# Patient Record
Sex: Male | Born: 1935 | Race: White | Hispanic: No | Marital: Married | Smoking: Former smoker
Health system: Southern US, Community
[De-identification: ages and names within clinical notes are randomized; demographics above are authoritative.]

## PROBLEM LIST (undated history)

## (undated) DIAGNOSIS — K449 Diaphragmatic hernia without obstruction or gangrene: Secondary | ICD-10-CM

## (undated) DIAGNOSIS — Z972 Presence of dental prosthetic device (complete) (partial): Secondary | ICD-10-CM

## (undated) DIAGNOSIS — IMO0002 Reserved for concepts with insufficient information to code with codable children: Secondary | ICD-10-CM

## (undated) DIAGNOSIS — K52831 Collagenous colitis: Secondary | ICD-10-CM

## (undated) DIAGNOSIS — D649 Anemia, unspecified: Secondary | ICD-10-CM

## (undated) DIAGNOSIS — K08109 Complete loss of teeth, unspecified cause, unspecified class: Secondary | ICD-10-CM

## (undated) DIAGNOSIS — N400 Enlarged prostate without lower urinary tract symptoms: Secondary | ICD-10-CM

## (undated) DIAGNOSIS — N189 Chronic kidney disease, unspecified: Secondary | ICD-10-CM

## (undated) DIAGNOSIS — D62 Acute posthemorrhagic anemia: Secondary | ICD-10-CM

## (undated) DIAGNOSIS — K5790 Diverticulosis of intestine, part unspecified, without perforation or abscess without bleeding: Secondary | ICD-10-CM

## (undated) DIAGNOSIS — K900 Celiac disease: Secondary | ICD-10-CM

## (undated) DIAGNOSIS — K222 Esophageal obstruction: Secondary | ICD-10-CM

## (undated) DIAGNOSIS — I10 Essential (primary) hypertension: Secondary | ICD-10-CM

## (undated) DIAGNOSIS — K219 Gastro-esophageal reflux disease without esophagitis: Secondary | ICD-10-CM

## (undated) DIAGNOSIS — K644 Residual hemorrhoidal skin tags: Secondary | ICD-10-CM

## (undated) DIAGNOSIS — K648 Other hemorrhoids: Secondary | ICD-10-CM

## (undated) DIAGNOSIS — M1990 Unspecified osteoarthritis, unspecified site: Secondary | ICD-10-CM

## (undated) DIAGNOSIS — K224 Dyskinesia of esophagus: Secondary | ICD-10-CM

## (undated) DIAGNOSIS — Z9989 Dependence on other enabling machines and devices: Secondary | ICD-10-CM

## (undated) DIAGNOSIS — G4733 Obstructive sleep apnea (adult) (pediatric): Secondary | ICD-10-CM

## (undated) DIAGNOSIS — K589 Irritable bowel syndrome without diarrhea: Secondary | ICD-10-CM

## (undated) DIAGNOSIS — G4730 Sleep apnea, unspecified: Secondary | ICD-10-CM

## (undated) HISTORY — DX: Diverticulosis of intestine, part unspecified, without perforation or abscess without bleeding: K57.90

## (undated) HISTORY — PX: EYE SURGERY: SHX253

## (undated) HISTORY — DX: Anemia, unspecified: D64.9

## (undated) HISTORY — DX: Reserved for concepts with insufficient information to code with codable children: IMO0002

## (undated) HISTORY — DX: Esophageal obstruction: K22.2

## (undated) HISTORY — DX: Dependence on other enabling machines and devices: Z99.89

## (undated) HISTORY — DX: Presence of dental prosthetic device (complete) (partial): K08.109

## (undated) HISTORY — PX: HIP SURGERY: SHX245

## (undated) HISTORY — DX: Celiac disease: K90.0

## (undated) HISTORY — DX: Sleep apnea, unspecified: G47.30

## (undated) HISTORY — DX: Diaphragmatic hernia without obstruction or gangrene: K44.9

## (undated) HISTORY — DX: Presence of dental prosthetic device (complete) (partial): Z97.2

## (undated) HISTORY — DX: Obstructive sleep apnea (adult) (pediatric): G47.33

## (undated) HISTORY — DX: Dyskinesia of esophagus: K22.4

## (undated) HISTORY — DX: Other hemorrhoids: K64.8

## (undated) HISTORY — DX: Irritable bowel syndrome, unspecified: K58.9

## (undated) HISTORY — DX: Gastro-esophageal reflux disease without esophagitis: K21.9

## (undated) HISTORY — DX: Residual hemorrhoidal skin tags: K64.4

## (undated) HISTORY — DX: Collagenous colitis: K52.831

---

## 1961-10-13 HISTORY — PX: APPENDECTOMY: SHX54

## 1998-09-19 ENCOUNTER — Ambulatory Visit (HOSPITAL_BASED_OUTPATIENT_CLINIC_OR_DEPARTMENT_OTHER): Admission: RE | Admit: 1998-09-19 | Discharge: 1998-09-19 | Payer: Self-pay | Admitting: General Surgery

## 2000-10-13 HISTORY — PX: HERNIA REPAIR: SHX51

## 2002-11-02 ENCOUNTER — Ambulatory Visit (HOSPITAL_COMMUNITY): Admission: RE | Admit: 2002-11-02 | Discharge: 2002-11-02 | Payer: Self-pay | Admitting: Family Medicine

## 2002-11-02 ENCOUNTER — Encounter: Payer: Self-pay | Admitting: Family Medicine

## 2003-09-01 ENCOUNTER — Ambulatory Visit (HOSPITAL_COMMUNITY): Admission: RE | Admit: 2003-09-01 | Discharge: 2003-09-01 | Payer: Self-pay | Admitting: Neurosurgery

## 2003-09-06 ENCOUNTER — Inpatient Hospital Stay (HOSPITAL_COMMUNITY)
Admission: AD | Admit: 2003-09-06 | Discharge: 2003-09-07 | Payer: Self-pay | Admitting: Physical Medicine and Rehabilitation

## 2003-09-11 ENCOUNTER — Encounter: Admission: RE | Admit: 2003-09-11 | Discharge: 2003-09-11 | Payer: Self-pay | Admitting: Neurosurgery

## 2003-09-25 ENCOUNTER — Encounter: Admission: RE | Admit: 2003-09-25 | Discharge: 2003-09-25 | Payer: Self-pay | Admitting: Neurosurgery

## 2003-11-14 ENCOUNTER — Encounter: Admission: RE | Admit: 2003-11-14 | Discharge: 2003-11-14 | Payer: Self-pay | Admitting: Neurosurgery

## 2005-10-31 ENCOUNTER — Ambulatory Visit (HOSPITAL_COMMUNITY): Admission: RE | Admit: 2005-10-31 | Discharge: 2005-10-31 | Payer: Self-pay | Admitting: Family Medicine

## 2005-11-04 ENCOUNTER — Ambulatory Visit (HOSPITAL_COMMUNITY): Admission: RE | Admit: 2005-11-04 | Discharge: 2005-11-04 | Payer: Self-pay | Admitting: Family Medicine

## 2006-01-26 ENCOUNTER — Ambulatory Visit (HOSPITAL_COMMUNITY): Admission: RE | Admit: 2006-01-26 | Discharge: 2006-01-26 | Payer: Self-pay | Admitting: Family Medicine

## 2006-08-03 ENCOUNTER — Encounter: Admission: RE | Admit: 2006-08-03 | Discharge: 2006-08-03 | Payer: Self-pay | Admitting: Gastroenterology

## 2006-10-13 HISTORY — PX: UPPER GASTROINTESTINAL ENDOSCOPY: SHX188

## 2006-12-04 ENCOUNTER — Ambulatory Visit (HOSPITAL_COMMUNITY): Admission: RE | Admit: 2006-12-04 | Discharge: 2006-12-04 | Payer: Self-pay | Admitting: Family Medicine

## 2007-04-27 ENCOUNTER — Ambulatory Visit (HOSPITAL_COMMUNITY): Admission: RE | Admit: 2007-04-27 | Discharge: 2007-04-27 | Payer: Self-pay | Admitting: Family Medicine

## 2008-11-02 ENCOUNTER — Ambulatory Visit (HOSPITAL_COMMUNITY): Admission: RE | Admit: 2008-11-02 | Discharge: 2008-11-02 | Payer: Self-pay | Admitting: Family Medicine

## 2009-01-05 ENCOUNTER — Ambulatory Visit (HOSPITAL_COMMUNITY): Admission: RE | Admit: 2009-01-05 | Discharge: 2009-01-05 | Payer: Self-pay | Admitting: Family Medicine

## 2009-01-18 ENCOUNTER — Ambulatory Visit (HOSPITAL_COMMUNITY): Admission: RE | Admit: 2009-01-18 | Discharge: 2009-01-18 | Payer: Self-pay | Admitting: Family Medicine

## 2009-04-24 ENCOUNTER — Encounter: Payer: Self-pay | Admitting: Pulmonary Disease

## 2009-04-24 ENCOUNTER — Inpatient Hospital Stay (HOSPITAL_COMMUNITY): Admission: EM | Admit: 2009-04-24 | Discharge: 2009-04-26 | Payer: Self-pay | Admitting: Emergency Medicine

## 2009-05-25 ENCOUNTER — Inpatient Hospital Stay (HOSPITAL_BASED_OUTPATIENT_CLINIC_OR_DEPARTMENT_OTHER): Admission: RE | Admit: 2009-05-25 | Discharge: 2009-05-25 | Payer: Self-pay | Admitting: Cardiology

## 2009-06-04 DIAGNOSIS — K219 Gastro-esophageal reflux disease without esophagitis: Secondary | ICD-10-CM

## 2009-06-04 DIAGNOSIS — K222 Esophageal obstruction: Secondary | ICD-10-CM | POA: Insufficient documentation

## 2009-06-04 DIAGNOSIS — M5380 Other specified dorsopathies, site unspecified: Secondary | ICD-10-CM | POA: Insufficient documentation

## 2009-06-04 DIAGNOSIS — IMO0002 Reserved for concepts with insufficient information to code with codable children: Secondary | ICD-10-CM | POA: Insufficient documentation

## 2009-06-04 DIAGNOSIS — K279 Peptic ulcer, site unspecified, unspecified as acute or chronic, without hemorrhage or perforation: Secondary | ICD-10-CM | POA: Insufficient documentation

## 2009-06-05 ENCOUNTER — Ambulatory Visit: Payer: Self-pay | Admitting: Pulmonary Disease

## 2009-06-05 DIAGNOSIS — R0602 Shortness of breath: Secondary | ICD-10-CM

## 2009-06-07 ENCOUNTER — Ambulatory Visit: Payer: Self-pay | Admitting: Pulmonary Disease

## 2009-06-13 ENCOUNTER — Telehealth: Payer: Self-pay | Admitting: Pulmonary Disease

## 2009-06-20 ENCOUNTER — Encounter: Payer: Self-pay | Admitting: Pulmonary Disease

## 2009-06-26 ENCOUNTER — Telehealth: Payer: Self-pay | Admitting: Pulmonary Disease

## 2009-06-27 ENCOUNTER — Encounter: Payer: Self-pay | Admitting: Adult Health

## 2009-07-05 ENCOUNTER — Encounter: Payer: Self-pay | Admitting: Pulmonary Disease

## 2009-07-05 ENCOUNTER — Ambulatory Visit: Payer: Self-pay | Admitting: Internal Medicine

## 2009-07-05 ENCOUNTER — Ambulatory Visit (HOSPITAL_COMMUNITY): Admission: RE | Admit: 2009-07-05 | Discharge: 2009-07-05 | Payer: Self-pay | Admitting: Pulmonary Disease

## 2009-07-11 ENCOUNTER — Encounter: Payer: Self-pay | Admitting: Pulmonary Disease

## 2010-11-02 ENCOUNTER — Encounter: Payer: Self-pay | Admitting: Neurosurgery

## 2011-01-19 LAB — DIFFERENTIAL
Eosinophils Absolute: 0.1 10*3/uL (ref 0.0–0.7)
Lymphocytes Relative: 14 % (ref 12–46)
Lymphs Abs: 1.5 10*3/uL (ref 0.7–4.0)
Monocytes Relative: 9 % (ref 3–12)
Neutro Abs: 8 10*3/uL — ABNORMAL HIGH (ref 1.7–7.7)
Neutrophils Relative %: 76 % (ref 43–77)

## 2011-01-19 LAB — URINALYSIS, ROUTINE W REFLEX MICROSCOPIC
Glucose, UA: NEGATIVE mg/dL
Ketones, ur: NEGATIVE mg/dL
Nitrite: NEGATIVE
Specific Gravity, Urine: 1.009 (ref 1.005–1.030)
pH: 5 (ref 5.0–8.0)

## 2011-01-19 LAB — COMPREHENSIVE METABOLIC PANEL
AST: 16 U/L (ref 0–37)
Albumin: 3.1 g/dL — ABNORMAL LOW (ref 3.5–5.2)
BUN: 19 mg/dL (ref 6–23)
Creatinine, Ser: 1.23 mg/dL (ref 0.4–1.5)
GFR calc Af Amer: >60 mL/min (ref >60)
Potassium: 4.3 mEq/L (ref 3.5–5.1)
Total Protein: 5.5 g/dL — ABNORMAL LOW (ref 6.0–8.3)

## 2011-01-19 LAB — D-DIMER, QUANTITATIVE: D-Dimer, Quant: 1 ug/mL-FEU — ABNORMAL HIGH (ref 0.00–0.48)

## 2011-01-19 LAB — CBC
HCT: 38.6 % — ABNORMAL LOW (ref 39.0–52.0)
MCV: 91.8 fL (ref 78.0–100.0)
MCV: 92.4 fL (ref 78.0–100.0)
Platelets: 165 10*3/uL (ref 150–400)
Platelets: 189 10*3/uL (ref 150–400)
RBC: 4.79 MIL/uL (ref 4.22–5.81)
RDW: 13.6 % (ref 11.5–15.5)
WBC: 10.6 10*3/uL — ABNORMAL HIGH (ref 4.0–10.5)
WBC: 8.1 10*3/uL (ref 4.0–10.5)

## 2011-01-19 LAB — CULTURE, BORDETELLA W/DFA-ST LAB: Culture: NOT DETECTED

## 2011-01-19 LAB — BASIC METABOLIC PANEL
BUN: 10 mg/dL (ref 6–23)
CO2: 28 mEq/L (ref 19–32)
Calcium: 9.2 mg/dL (ref 8.4–10.5)
Chloride: 98 mEq/L (ref 96–112)
Creatinine, Ser: 0.91 mg/dL (ref 0.4–1.5)
GFR calc Af Amer: >60 mL/min (ref >60)
GFR calc non Af Amer: >60 mL/min (ref >60)
Glucose, Bld: 97 mg/dL (ref 70–99)
Potassium: 4.4 mEq/L (ref 3.5–5.1)
Sodium: 132 mEq/L — ABNORMAL LOW (ref 135–145)

## 2011-01-19 LAB — LIPID PANEL
HDL: 73 mg/dL (ref >39)
LDL Cholesterol: 50 mg/dL (ref 0–99)
Triglycerides: 57 mg/dL (ref <150)
VLDL: 11 mg/dL (ref 0–40)

## 2011-01-19 LAB — CK TOTAL AND CKMB (NOT AT ARMC): CK, MB: 0.9 ng/mL (ref 0.3–4.0)

## 2011-01-19 LAB — CARDIAC PANEL(CRET KIN+CKTOT+MB+TROPI)
CK, MB: 0.7 ng/mL (ref 0.3–4.0)
Relative Index: INVALID (ref 0.0–2.5)
Troponin I: 0.01 ng/mL (ref 0.00–0.06)

## 2011-01-19 LAB — POCT CARDIAC MARKERS
CKMB, poc: <1 ng/mL — ABNORMAL LOW (ref 1.0–8.0)
Myoglobin, poc: 38.7 ng/mL (ref 12–200)
Troponin i, poc: <0.05 ng/mL (ref 0.00–0.09)

## 2011-01-19 LAB — BRAIN NATRIURETIC PEPTIDE: Pro B Natriuretic peptide (BNP): 39 pg/mL (ref 0.0–100.0)

## 2011-01-19 LAB — TSH: TSH: 1.9 u[IU]/mL (ref 0.350–4.500)

## 2011-01-19 LAB — TROPONIN I: Troponin I: 0.02 ng/mL (ref 0.00–0.06)

## 2011-02-25 NOTE — H&P (Signed)
[name], [name] NO.:  [id number]   MEDICAL RECORD NO.:  [id number]          PATIENT TYPE:  INP   LOCATION:  2683                         FACILITY:  [facility]   PHYSICIAN:  Peter M. Martinique, M.D.  DATE OF BIRTH:  [birth year]   DATE OF ADMISSION:  05/25/2009  DATE OF DISCHARGE:                              HISTORY & PHYSICAL   HISTORY OF PRESENT ILLNESS:  [name] is a 76 year old white male who  was recently admitted to the hospital from July 13, through April 26, 2009 for evaluation of chest pain.  His evaluation on that admission was  unremarkable including serial cardiac enzymes and ECGs and a normal CT  angiogram of the chest.  He was subsequently discharged to home with a  follow up and that his symptoms were possibly related to reactive airway  disease.  He had also had a recent bout of sinusitis.  He subsequently  had follow up outpatient stress testing with a nuclear stress test on  May 09, 2009.  He walked for 6 minutes on the Bruce protocol with the  same type of chest tightness.  He had no ECG changes.  His nuclear  images suggested attenuation of the inferior wall, possibly consistent  with ischemia.  Given his ongoing symptoms, it is felt that he needed  more definitive diagnosis and he is now admitted to undergo diagnostic  cardiac catheterization.   PAST MEDICAL HISTORY:  Positive for back spasms.  He has a history of  acid reflux disease.  He has had previous esophageal stricture.  He has  a remote history of peptic ulcer disease.  He has a history of  degenerative disease of the cervical spine.   ALLERGIES:  He has no known allergies.   CURRENT MEDICATIONS:  1. Zolpidem 10 mg at bedtime.  2. Omeprazole 40 mg b.i.d.  3. Vicodin p.r.n.  4. Aspirin 81 mg per day.  5. Zanaflex 4 mg b.i.d.  6. Meclizine 25 mg q.i.d.  7. Doxazosin 4 mg daily.  8. Mobic 15 mg daily.  9. Fexofenadine 180 mg daily.  10.Singulair 10 mg daily.  11.Viagra  p.r.n.  12.Multivitamin daily.  13.Fish oil 1000 mg b.i.d.  14.Glucosamine b.i.d.  15.Vitamin D3 1000 units daily.  16.Tums twice daily.   SOCIAL HISTORY:  He is married.  He quit smoking over 35 years ago.  He  drinks occasional alcohol.  He is the father-in-law to Dr. Gilford Raid.  He is retired from the [employer].   FAMILY HISTORY:  His father died of a stroke at age 65.  Mother died of  stomach cancer at age 68.  There is no family history of heart disease.   REVIEW OF SYSTEMS:  Otherwise reviewed in detail and is negative.   PHYSICAL EXAMINATION:  GENERAL:  He is a pleasant, white male in no  apparent distress.  VITAL SIGNS:  His weight is 160 pounds, blood pressure is 118/70, pulse  is 72 and regular, respirations are normal.  HEENT:  He is normocephalic, atraumatic.  His pupils are equal, round  and reactive.  Sclerae clear.  Oropharynx is clear.  NECK:  There is no JVD, adenopathy, thyromegaly or bruits.  LUNGS:  Clear.  CARDIAC:  Regular rate and rhythm without gallop or murmur.  ABDOMEN:  Soft and nontender without mass or bruits.  Bowel sounds are  positive.  EXTREMITIES:  Femoral and pedal pulses are 2+ and symmetric.  He has no  edema.  NEUROLOGIC:  Alert and oriented x4.  Cranial nerves II-XII are intact.  He has a nonfocal exam.   LABORATORY DATA:  His resting ECG is normal.   IMPRESSION:  1. Atypical chest pain.  Stress nuclear study is suggested but      somewhat equivocal for inferior wall ischemia.  2. History of gastroesophageal reflux disease and esophageal      stricture.  3. History of cervical arthritis.   PLAN:  We will proceed with diagnostic cardiac catheterization with  further therapy pending these results.           ______________________________  Peter M. Martinique, M.D.     PMJ/MEDQ  D:  05/21/2009  T:  05/22/2009  Job:  [id number]   cc:   Darlin Coco, M.D.  Gilford Raid, M.D.  Halford Chessman, M.D.

## 2011-02-25 NOTE — H&P (Signed)
[name], SPLITT NO.:  [id number]   MEDICAL RECORD NO.:  [id number]          PATIENT TYPE:  INP   LOCATION:  5852                         FACILITY:  [facility]   PHYSICIAN:  Alcide Evener, MD  DATE OF BIRTH:  11-Aug-1935   DATE OF ADMISSION:  04/24/2009  DATE OF DISCHARGE:                              HISTORY & PHYSICAL   PRIMARY CARE PHYSICIAN:  Dr. Hilma Favors.   CHIEF COMPLAINT:  Chest tightness with cough.   HISTORY OF PRESENT ILLNESS:  [name] is a 76 year old Caucasian male  with past medical history significant for peptic ulcer disease, acid  reflux, seasonal allergies and prior smoking who on Friday was getting  out of his car when he was suddenly struck by a wave of hot air.  He  then developed chest tightness which has persisted since then.  He has  also developed a paroxysmal coughing which is nonproductive.  He denies  fevers or chills.  Chest tightness was up to 7 or 8/10 in severity.  He  saw his primary care physician who prescribed azithromycin.  The patient  had persistent chest tightness and was seen again by his primary care  physician who gave him some sublingual nitroglycerin which relieved  chest tightness, it was 6/10, down to 2/10 in severity.  He presented to  the emergency department at [facility] where his EKG was found  and showed normal sinus rhythm with no acute ST or T-wave changes other  than the T-wave inversion in III which is present now and not seen in an  EKG done in 2004, when he underwent workup for chest pain.  The  patient's cardiac biomarkers were negative in the emergency department.  His D-dimer was slightly positive and he had a CT angiogram that showed  no evidence of pulmonary embolism.  The patient has had 6 months of  decreased ability to smell.  He had been seen by Dr. Constance Holster with ear,  nose and throat who had an endoscopy on him and had found evidence of  bad sinusitis and placed the patient on  Augmentin two times daily for 3  weeks.  His reevaluated by Dr. Constance Holster recently and then put on a  prednisone taper which he currently is in the midst of completing.  The  patient has no history of hyperlipidemia.  He is slightly hypertensive  in the emergency room, but has not been diagnosed with hypertension.  He  has no family history of coronary artery disease.  He has not yet  received nitroglycerin here.  His chest pain is currently 4/10 in  severity.  He does endorse being around a grandchild who has asthma and  who has been coughing a lot recently.  Otherwise, he had no sick  contacts.  He has a dog at home but no birds or other unusual exposures.  Despite his past history of smoking, he has never been diagnosed with  COPD or chronic bronchitis.  He denies having asthma.  Currently, chest  tightness is 4/10 in severity.  It does not radiate to either arm.  It  is not accompanied by nausea.  It is accompanied by dyspnea.  The  patient did have an esophageal stricture and this was thought possibly  to be in play for his chest pain in the past.  Certainly, if this  becomes more suggestive on further questioning, we will consult GI for  possible EGD.   PAST MEDICAL HISTORY:  1. Peptic ulcer disease with upper GI bleed remotely.  2. Reflux esophagitis and history of esophageal stricture followed by      Dr. Velora Heckler previously.  3. History of chest pain in 2004, status post admission for an      overnight stay with rule out with cardiac enzymes. Chest pain was      attributed to esophagitis at that point in time.  4. He also has a history of degenerative disease of the cervical      spine.   PAST SURGICAL HISTORY:  Dilatation of esophageal stricture.   SOCIAL HISTORY:  Married, prior smoker 35 years ago.  Occasional  alcohol.  No recreational drug use.   FAMILY HISTORY:  No history of premature coronary artery disease.  His  father did have a stroke.   REVIEW OF SYSTEMS:   Described in history of present illness, 10-point  review systems is negative.   PHYSICAL EXAMINATION:  VITAL SIGNS:  Blood pressure in the emergency  apartment up to 170/89, pulse 63, respirations 14, temperature 97.4,  pulse ox was 100% on room air.  GENERAL:  Quite pleasant gentleman in no acute stress.  HEENT:  Alert and oriented x 4.  HEENT: Normocephalic, atraumatic.  He had some tenderness about his  maxillary sinuses, left greater than right.  Oropharynx clear without  exudate or lesions.  NECK:  Supple.  CARDIOVASCULAR:  Regular rate and rhythm.  No murmurs, gallops or rubs.  LUNGS: Clear to auscultation bilaterally without wheeze or rales.  ABDOMEN:  Soft, nondistended, nontender.  EXTREMITIES: No edema.   MEDICATIONS:  1. The patient is on Vicodin 5/500 every 6 hours as needed for pain.  2. He is on Zanaflex 4 mg twice daily for spasms.  3. Singulair 10 mg daily.  4. Prilosec 40 mg daily.  5. Prednisone.  He was on a taper of 60 mg times 2 days, 40 mg times 2      days, 30 mg times 2 days, 20 mg times 3 days, and he is currently      on the first day of a 3 day taper of the 20 mg of prednisone.  6. He is also on azithromycin 500 mg daily.   LABORATORY DATA:  Chest x-ray done in the emergency apartment shows no  active cardiopulmonary disease.  CT angiogram of the chest shows no  pulmonary embolism, no aneurysm, no dissection, no mass, adenopathy, no  infiltrate, no effusion.   EKG shows sinus rhythm with T-wave inversion in lead III, otherwise no  EKG findings.  Cardiac enzymes were negative.  Beta natriuretic peptide  39.  Urinalysis, we are not sure why this was done, was negative.  Metabolic panel: Sodium 315, potassium 4.4, chloride 98, BUN and  creatinine are 10 and 0.91, glucose 97, calcium 9.2, D-dimer positive at  1.  CBC:  White count 10.6, hemoglobin 14.7, platelets 149, ANC of 8,  ALT of 1.5.   ASSESSMENT/PLAN:  A 76 year old Caucasian gentleman with past  medical  history significant for likely undiagnosed hypertension, peptic ulcer  disease, prior esophagitis, esophageal stricture who presents with  acute  onset Friday of chest tightness accompanied by dyspnea and paroxysmal  coughing while he was in the midst of prednisone treatment for  sinusitis.  1. Chest tightness.  This seems unlikely to be cardiogenic in nature,      but I am going to give him some nitroglycerin here in the emergency      department.  He did respond to that in the doctor's office earlier      today.  I am also going to give him morphine for pain that still      persists beyond treatment with nitroglycerin.  I will put him on a      baby aspirin.  I will cycle his cardiac enzymes and we will talk      Cardiology in the morning with regards to further risk      stratification.  I will check a fasting lipid profile as well in      the morning.  More than likely I think the patient may have      bronchitis, may be possibly pertussis.  See below.  2. Paroxysmal cough be due to reactive airway disease, but certainly      with his exposure to a young child who recently had paroxysmal      coughing as well as asthmatic complications, he could have      pertussis that is Bordetella pertussis.  He lacks the findings of      lymphocytosis.  He is not coughing until he vomits, but he does      have a paroxysmal cough and his Augmentin would not have covered      the pertussis.  I will put him on droplet precautions.  I will get      a nasopharyngeal swab for PCR testing for Bordetella pertussis and      I will start him on Avelox after the nasopharyngeal swab is      obtained.  The patient will need to be on droplet precautions      pending PCR test results.  These will likely not be back until      after he has left the hospital so he will likely need to be on      precaution while he is in the hospital.  He should complete 5 days      of fluoroquinolone therapy before he  returns to see any young      children.  3. Sinusitis.  Again, I am going to start him on Avelox and complete a      10 day course which will cover him for flare of sinusitis.  He will      need to follow-up with Dr. Constance Holster.  I will continue his prednisone      taper for now.  4. Prophylax.  The patient is on heparin 5000 t.i.d.  5. Code status.  The patient is a full code note.      Alcide Evener, MD  Electronically Signed     CV/MEDQ  D:  04/24/2009  T:  04/24/2009  Job:  [id number]   cc:   Dr. Baker Pierini H. Constance Holster, MD

## 2011-02-25 NOTE — Discharge Summary (Signed)
NAMECAN, LUCCI               ACCOUNT NO.:  [id number]   MEDICAL RECORD NO.:  [id number]          PATIENT TYPE:  INP   LOCATION:  9242                         FACILITY:  [facility]   PHYSICIAN:  Rexene Alberts, M.D.    DATE OF BIRTH:  [birth year]   DATE OF ADMISSION:  04/24/2009  DATE OF DISCHARGE:  04/26/2009                               DISCHARGE SUMMARY   DISCHARGE DIAGNOSES:  1. Chest pain and cough, possibly secondary to reactive airways      disease.  Myocardial infarction ruled out with negative cardiac      enzymes.  CT scan of the chest was negative for pulmonary embolism.  2. Recent history of sinusitis.  Treated with antibiotics and a      steroid taper by Dr. Constance Holster.  3. History of reflux esophagitis.   DISCHARGE MEDICATIONS:  1. Colace 40 mg b.i.d.  2. Avelox 400 mg daily for 4 more days.  3. Prednisone 20 mg daily for 2 more days.  4. Aspirin 81 mg daily.  5. Vicodin 5/500 mg every 6 hours as needed for pain.  6. Zanaflex 4 mg twice daily.  7. Singulair 10 mg daily.  8. Allegra 180 mg daily.  9. Actonel 35 mg weekly.  10.Albuterol inhaler 2 puffs every 4 hours as needed for shortness of      breath and wheezing.   DISCHARGE DISPOSITION:  The patient is being discharged to home in  improved and stable condition.  He was advised to follow up with his  primary care physician, Dr. Hilma Favors in 1-2 weeks.  Cardiologist, Dr.  Mare Ferrari will schedule a followup appointment for the patient in 1-2  weeks.  The patient was also advised to follow up with his  gastroenterologist, Dr. Earlean Shawl in 1-2 weeks or as previously recommended  and with Dr. Constance Holster as previously recommended.   CONSULTATIONS:  Darlin Coco, MD   PROCEDURES PERFORMED:  1. CT angiogram of the chest on April 24, 2009.  The results were      negative for pulmonary embolism.  No acute abnormality.  2. Chest x-ray on April 24, 2009.  The results revealed no active      cardiopulmonary disease.   HISTORY OF  PRESENT ILLNESS:  The patient is a 76 year old man with a  past medical history significant for peptic ulcer disease,  gastroesophageal reflux disease, esophagitis, and seasonal allergies.  He presented to the emergency department on April 24, 2009, with a chief  complaint of chest tightness and a cough.  When he was evaluated in the  emergency department, his EKG revealed no ST or T-wave abnormalities.  His D-dimer was elevated and therefore a CT angiogram was ordered.  The  CT angiogram was negative for pulmonary embolism.  He was given  sublingual nitroglycerin and it decreased his pain from a 6/10 to a 2/10  in intensity.  The patient was admitted for further evaluation and  management.   For additional details, please see the dictated history and physical.   HOSPITAL COURSE:  1. CHEST TIGHTNESS, COUGH, POSSIBLE REACTIVE AIRWAYS, SINUSITIS, AND  HISTORY OF REFLUX ESOPHAGITIS.  As stated above, the patient was      given sublingual nitroglycerin in the emergency department and it      decreased the intensity of his pain.  Therefore, the patient was      started on as-needed sublingual nitroglycerin.  His pain was also      treated as needed with morphine.  His initial EKG revealed normal      sinus rhythm with no acute ST or T-wave abnormalities.  His      followup EKG was virtually unremarkable with the exception of sinus      bradycardia with a heart rate of 59 beats per minute.  His chest x-      ray on admission revealed no acute cardiopulmonary disease.      Because of the patient's recent exposure to a sick child with a      cough, it was felt that the patient may have acquired pertussis      infection; however, this was never confirmed during the      hospitalization.  A swab of his nasopharynx was obtained in the      emergency department to rule out pertussis; however, the results      are still currently pending.  Nevertheless, the patient was started      on Avelox 400  mg daily for empiric treatment.  He was also started      on albuterol nebulizers for treatment of chest tightness and cough.      Although he had no history of coronary artery disease, aspirin      therapy was started at 81 mg daily empirically.   For further evaluation, cardiac enzymes were ordered as well as TSH,  fasting lipid panel, BNP, and HIV antibody.  His cardiac enzymes were  completely normal.  His fasting lipid profile revealed a total  cholesterol of 134, triglycerides of 57, HDL cholesterol of 73, and LDL  cholesterol of 50.  His TSH was within normal limits at 1.9.  His HIV  antibody was nonreactive.  Apparently, Dr. Tommy Medal ordered the HIV for  completion of an infectious etiology workup.   Cardiologist, Dr. Mare Ferrari was consulted for further evaluation and  management.  He evaluated the patient yesterday, April 25, 2009.  Per his  assessment, he believed that the patient's chest tightness was  noncardiac in origin.  He suspected that his symptoms were respiratory  in origin and possibly secondary to a viral illness.  Dr. Mare Ferrari  recommended and advised the patient to follow up with him in the  outpatient setting for further evaluation.  Dr. Mare Ferrari will be  arranging an outpatient stress test.   The patient was continued on the prednisone taper as was previously  prescribed by his ENT physician, Dr. Constance Holster.  Upon discharge, the patient  was advised to continue prednisone 20 mg daily for 2 more days and then  stop according to the previous instructions.  Throughout the  hospitalization, the patient had no complaints of chest tightness or  purulent cough.  He was completely asymptomatic at the time of hospital  discharge.  Of note, Prilosec was increased to 40 mg b.i.d. for his  history of reflux esophagitis.  The patient stated that he is due to  follow up with his gastroenterologist, Dr. Earlean Shawl in the next few weeks.      Rexene Alberts, M.D.  Electronically  Signed     DF/MEDQ  D:  04/26/2009  T:  04/26/2009  Job:  [id number]   cc:   Halford Chessman, M.D.  Darlin Coco, M.D.  Jefry H. Constance Holster, MD  Mayme Genta, M.D.

## 2011-02-25 NOTE — Cardiovascular Report (Signed)
[name], JANCZAK NO.:  [id number]   MEDICAL RECORD NO.:  [id number]          PATIENT TYPE:  OIB   LOCATION:  1961                         FACILITY:  [facility]   PHYSICIAN:  Peter M. Martinique, M.D.  DATE OF BIRTH:  1934-11-07   DATE OF PROCEDURE:  05/25/2009  DATE OF DISCHARGE:  05/25/2009                            CARDIAC CATHETERIZATION   INDICATIONS FOR PROCEDURE:  A 76 year old white male with history of  recent chest pain.  Stress Cardiolite study was suggestive of possible  inferior wall ischemia.   PROCEDURES:  1. Left heart catheterization.  2. Coronary and left ventricular angiography.   ACCESS:  Via the right femoral artery using standard Seldinger  technique.   EQUIPMENTS:  1. A 4-French 4-cm left Judkins catheter.  2. A 4-French 3-DRC catheter.  3. A 4-French pigtail catheter.  4. A 4-French arterial sheath.   MEDICATIONS:  1. Local anesthesia 1% Xylocaine.  2. Fentanyl 75 mcg IV.  3. Versed 4 mg IV.   COMPLICATIONS:  The patient experienced no complications.   CONTRAST:  90 mL of Omnipaque.   HEMODYNAMIC DATA:  1. Aortic pressure was 109/35 with a mean of 68 mmHg.  2. Left ventricular pressure was 107 with an EDP of 12 mmHg.   ANGIOGRAPHIC DATA:  1. The left coronary artery rises and distributes normally.  The left      main coronary artery is normal.  2. The left anterior descending artery is normal throughout its      course.  3. There is a large intermediate branch, which is normal.  4. The left circumflex coronary artery is normal.  5. The right coronary artery rises and distributes normally.  It is a      normal vessel.   Left ventricular angiography was performed in the RAO view.  This  demonstrates normal left ventricular size, contractility with normal  systolic function.  Ejection fraction is estimated 65-70%.  There is no  mitral regurgitation or prolapse.  The aortic root appears normal.   FINAL INTERPRETATION:  1.  Normal coronary anatomy.  2. Normal left ventricular function.  3. Normal hemodynamics.           ______________________________  Peter M. Martinique, M.D.     PMJ/MEDQ  D:  05/25/2009  T:  05/26/2009  Job:  [id number]   cc:   Halford Chessman, M.D.  Darlin Coco, M.D.  Gilford Raid, M.D.

## 2011-02-25 NOTE — Consult Note (Signed)
[name], [name]               ACCOUNT NO.:  [id number]   MEDICAL RECORD NO.:  [id number]          PATIENT TYPE:  INP   LOCATION:  3728                         FACILITY:  [facility]   PHYSICIAN:  Darlin Coco, M.D. DATE OF BIRTH:  01/23/1935   DATE OF CONSULTATION:  04/25/2009  DATE OF DISCHARGE:                                 CONSULTATION   I was asked to see this pleasant 76 year old gentleman by Dr. Caryn Section in  regard to symptoms of chest tightness.  The patient was admitted on April 24, 2009.  He gave a 5-day history of chest tightness and nonproductive  cough which seemed to be initially precipitated by exposure to extreme  high temperatures outside.  He does not have any prior history of asthma  or lung disease.  He has had a history of celiac disease which was  discovered 2 years ago and he is under the treatment of Dr. Earlean Shawl.  He  has now been on a gluten-free diet and in the first 6 months after the  diet was implemented he gained 40 pounds.  The patient does not have any  history of palpitations or congestive heart failure.  There is no  history of high blood pressure or high cholesterol.  Normally, he has  good exercise tolerance.  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   PAST MEDICAL HISTORY:  Positive for back spasms.   MEDICATIONS ON ADMISSION:  1. Vicodin 5/500 p.r.n.  2. Zanaflex 4 mg twice daily p.r.n.  3. Allegra 180 mg daily.  4. Singulair 10 mg daily.  5. Actonel 35 mg weekly.  6. Prilosec 40 mg daily.   FAMILY HISTORY:  His father died of a stroke at 63.  Mother died of  stomach cancer at age 54.  There is no family history of heart attacks.   SOCIAL HISTORY:  He has been a nonsmoker for the past 35 years.  He  drinks occasional beer and occasional wine.  He is married and has 3  children.  He is the father-in-law of Dr. Gilford Raid.  He retired from  [employer].   REVIEW OF SYSTEMS:  Otherwise negative in detail.   PAST SURGICAL HISTORY:  Ruptured appendix in 1962 and right inguinal  herniorrhaphy 7 years ago.   PHYSICAL EXAMINATION:  VITAL SIGNS:  His blood pressure is 126/71, pulse  is 67, normal sinus rhythm.  HEENT:  Negative.  NECK:  Jugular venous pressure is normal.  Carotids normal.  Thyroid  normal.  No lymphadenopathy.  SKIN:  Clear.  CHEST:  Clear to percussion and auscultation without any wheezing or  rhonchi.  HEART:  No murmur, gallop, rub, or click.  There is no abnormal lift or  heave.  ABDOMEN:  Soft and nontender.  Liver and spleen not enlarged.  Bowel  sounds are active.  There is no abdominal mass or tenderness.  EXTREMITIES:  No phlebitis or edema.  Pedal pulses are good.   Workup to date includes  normal EKG, normal chest x-ray, normal CT angio  which was negative for pulmonary emboli.  Cardiac enzymes have all been  normal and his lipids are very low with cholesterol 134, LDL of 50, HDL  of 73, and triglycerides of 57.   IMPRESSION:  Probably noncardiac chest tightness.  I suspect his  symptoms are respiratory in origin, possibly secondary to viral illness.  His exertional symptoms appear to respond promptly to inhalers here in  the hospital.   RECOMMENDATION:  We continue current therapy for his respiratory  illness.  He will be on tapering doses of steroids.  We will plan to do  an outpatient stress Cardiolite in several weeks.  We will arrange for  this with the patient after he goes home and is stronger from the  standpoint of his lungs and respiratory function.   Many thanks for the opportunity to see this pleasant gentleman with you.           ______________________________  Darlin Coco, M.D.     TB/MEDQ  D:  04/25/2009  T:  04/26/2009  Job:  [id number]   cc:   Dr. Caryn Section

## 2011-02-28 NOTE — Discharge Summary (Signed)
[name], [name]                           ACCOUNT NO.:  [id number]   MEDICAL RECORD NO.:  [id number]                   PATIENT TYPE:  INP   LOCATION:  2035                                 FACILITY:  [facility]   PHYSICIAN:  Gilford Raid, M.D.                  DATE OF BIRTH:  1934-12-01   DATE OF ADMISSION:  DATE OF DISCHARGE:  09/07/2003                                 DISCHARGE SUMMARY   ADMITTING DIAGNOSES:  Chest pain, rule out myocardial infarction.   DISCHARGE DIAGNOSES:  Chest pain, likely esophagitis.   ADMITTING PHYSICIAN:  Gilford Raid, M.D.   HISTORY OF HOSPITALIZATION:  This patient is a 76 year old gentleman with a  history of previous esophagitis who was admitted with severe substernal  chest pressure and pain as well as pain down his left arm, hoarseness and  shortness of breath.  He had a history of cervical disk disease that has  been causing neck pain, and he has developed pain down his left arm.  This  has been evaluated, and he is currently undergoing treatment by Dr. Glenna Fellows.  He was seen by his primary physician in [place], Dr. Aquilla Solian,  and an initial EKG and CPK enzymes were negative.  His symptoms were very  suggestive of coronary ischemia.  Therefore, he was admitted to [facility]  by me to rule out myocardial infarction or other cause of his chest pain.  His initial CPK was 31 with an MB of 0.7.  Troponin was 0.01.  Given his  shortness of breath and chest pain, we performed a spiral chest CT to rule  out pulmonary embolism.  This showed no evidence of pulmonary embolism.  There was no aortic dissection.  There was no pleural or pericardial  effusion.  The lung were clear.  There was no adenopathy.  The esophagus  appeared unremarkable.  There was no abnormality in the upper abdomen.  There was really minimal size of arterial atherosclerosis to suggest  possible coronary disease.  Chest x-ray was normal.   His pain continued to improve.  On the  morning following admission, his  troponin was less than 0.01, and CPK continued to be negative at 55 with an  MB of 1.1.  Electrocardiogram showed normal sinus rhythm with nonspecific T-  wave changes.  His exam remained normal.  It was felt that most likely his  symptoms were due to esophagitis.  He had been having vomiting in the past  week related to some pain medicine he was taking for his back, and this may  have exacerbated his esophagitis.  He is being discharged to home and will  follow up with his gastroenterologist, Dr. Clarene Reamer for possible  exacerbation of esophagitis.   MEDICATIONS:  Given by me were Ultram 50 mg, one q.6h. p.r.n. for pain, #5.   FOLLOWUP INSTRUCTIONS:  1. He will follow up  with Dr. Rachelle Hora concerning his esophagitis.  2. Medical follow up will be with Dr. Halford Chessman.  3. He will follow up with me as needed.   DISCHARGE DIAGNOSIS:  Chest pain, probably due to esophagitis.                                               Gilford Raid, M.D.   BB/MEDQ  D:  09/07/2003  T:  09/07/2003  Job:  [id number]

## 2011-02-28 NOTE — H&P (Signed)
[name], [name]                           ACCOUNT NO.:  [id number]   MEDICAL RECORD NO.:  [id number]                   PATIENT TYPE:  INP   LOCATION:  2035                                 FACILITY:  [facility]   PHYSICIAN:  Gilford Raid, M.D.                  DATE OF BIRTH:  05/23/1935   DATE OF ADMISSION:  09/06/2003  DATE OF DISCHARGE:                                HISTORY & PHYSICAL   REASON FOR ADMISSION:  Chest pain.   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 He has been seen and treated by Dr. Glenna Fellows for this problem.  He has  been taking Vicodin p.r.n. for pain. He has been on a steroid taper which he  is to complete today. He recently underwent an MRI and plans were made to  begin cortisone injections into his spine. The patient describes, last  night, developing severe substernal chest pressure and heaviness without  radiation. This occurred at rest. It was associated with shortness of  breath.  The pain persisted all night, but was somewhat better this morning.  He had also been having some pain under his left axilla, as well as down his  left arm. He presented to his medical physician, Dr. Sharilyn Sites, today and  an electrocardiogram was performed which showed no acute changes. His  initial CPK was low. Given his symptoms, it was felt it would be best to  admit him to the hospital to rule out myocardial ischemia and try to  elucidate the cause of his chest pain.  Dr. Hilma Favors called me and I decided  to admit him to [facility] for further workup.   The patient does describing having multiple episodes of vomiting over the  past week related to taking narcotic pain medication for his neck. Some of  his episodes have been fairly violent.  He has not had any vomiting  since  this past Sunday. He also notes a hoarse voice developing today.   PAST MEDICAL HISTORY:  Significant for history of reflux esophagitis with  esophageal stricture, followed by Dr. Rachelle Hora. He has been treated  with Nexium. There is no history of cardiac disease. He denies  hypercholesterolemia and hypertension. He denies diabetes.  He has had no  prior surgery and denies any other medical illnesses.   SOCIAL HISTORY:  He is married.  He is a nonsmoker and drinks alcohol  socially, but not to excess.   FAMILY HISTORY:  Negative for cardiac disease.   REVIEW OF SYSTEMS:  GENERAL: He denies fever or chills. He has had no recent  weight changes. He denies fatigue. EYES: Negative. ENT: He wears dentures.  ENDOCRINE:  He  denies diabetes and hyperthyroidism. CARDIOVASCULAR: He has  had chest pressure and shortness of breath as mentioned above.  The chest  pressure and pain seem to be worsened by deep inspiration. There is no  radiation. He has had no nausea or vomiting. He denies PND or orthopnea.  RESPIRATORY: He denies cough or sputum production. He has had shortness of  breath since yesterday.  GU: He denies dysuria or hematuria. GI: He has had  some vomiting, but none since this past Sunday. He relates this to taking  narcotic pain medication.  He denies melena or bright red blood per rectum.  He denies any abdominal pain. He has had regular bowel movements without  constipation or diarrhea. NEUROLOGIC: He denies any focal numbness. He has  had chronic pain in his neck and now his left arm related to cervical disk  disease.  He has never had a TIA or stroke. PSYCHIATRIC: Negative.   PHYSICAL EXAMINATION:  VITAL SIGNS: His blood pressure is 120/70  and his  pulse is 75 and regular. Respiratory rate is 16 and unlabored.  GENERAL: He is a thin, white male in no distress.  HEENT: Normocephalic and atraumatic.  Pupils equal, round, and reactive to  light.  Extraocular muscles  are intact. Throat is clear.  NECK: Normal carotid pulses bilaterally. There are no bruits. There is no  adenopathy or thyromegaly.  CARDIAC: Regular rate and rhythm with normal S1 and S2.  There are no  murmurs, rubs, or gallops.  LUNGS: Clear.  ABDOMEN: Active bowel sounds. His abdomen is soft, flat, and nontender.  There are no palpable masses or organomegaly.  EXTREMITIES: No peripheral edema. Pedal pulses are palpable bilaterally.  SKIN: Warm and dry.  NEUROLOGIC: Alert and oriented times three. Motor and sensory exam grossly  normal.   Laboratory examination shows normal electrolytes with BUN of 11, creatinine  0.8. White blood cell count 6.7 with a hemoglobin of 14.8, platelet count  255,000.  His initial troponin-I is 0.01 and his initial CPK is 31 with an  MB of 0.7.   IMPRESSION:  This patient's chest pain symptoms are concerning for  myocardial ischemia, although he has no family history or other risk factors  for coronary disease. 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  Dr. Hilma Favors  and I felt that the patient should be admitted for observation and to rule  out myocardial ischemia with serial CPK and troponin enzymes.  We will  obtain an EKG. With chest pain and shortness of breath, I will plan to do a  spiral CT scan of the chest to rule out pulmonary embolism, although I think  the likelihood of this is low.  This will also give [place] some information  about his lungs as well as rule out pleural effusion and pericardial  effusion. We will obtain a chest x-ray. If this workup is all negative, then  I suspect that his symptoms are more likely due to gastroesophageal reflux and possibly esophagitis which could have been made worse by his recent pain   medication use and vomiting.  Gilford Raid, M.D.    BB/MEDQ  D:  09/06/2003  T:  09/06/2003  Job:  [id number]

## 2011-08-11 ENCOUNTER — Encounter (INDEPENDENT_AMBULATORY_CARE_PROVIDER_SITE_OTHER): Payer: Self-pay

## 2011-08-12 ENCOUNTER — Ambulatory Visit (INDEPENDENT_AMBULATORY_CARE_PROVIDER_SITE_OTHER): Payer: Medicare Other | Admitting: General Surgery

## 2011-08-12 ENCOUNTER — Encounter (INDEPENDENT_AMBULATORY_CARE_PROVIDER_SITE_OTHER): Payer: Self-pay | Admitting: General Surgery

## 2011-08-12 VITALS — BP 138/76 | HR 68 | Temp 97.6°F | Resp 14 | Ht 67.0 in | Wt 145.0 lb

## 2011-08-12 DIAGNOSIS — K4090 Unilateral inguinal hernia, without obstruction or gangrene, not specified as recurrent: Secondary | ICD-10-CM | POA: Insufficient documentation

## 2011-08-12 NOTE — Progress Notes (Signed)
Chief Complaint  Patient presents with  . Other    Eval of left inguinal hernia    HPI [name] is a 76 y.o. male.   HPI  He is referred by Dr. Hilma Favors for evaluation and treatment of a left inguinal hernia.  He had the recent onset of a left inguinal bulge that has been getting larger and more uncomfortable.  He is wearing hernia underwear and this helps with the discomfort.  No obstructive symptoms.  No difficulty with urination.  No constipation.  He had a right inguinal hernia repaired in 09/1998.  Past Medical History  Diagnosis Date  . IBS (irritable bowel syndrome)   . Full dentures   . Ulcer   . Hemorrhoids   . GERD (gastroesophageal reflux disease)   . Osteoporosis   . Hiatal hernia     Past Surgical History  Procedure Date  . Appendectomy 1963  . Hernia repair 2002    right inguinal     Family History  Problem Relation Age of Onset  . Cancer Mother   . Stroke Father   . Cancer Sister   . Cancer Sister   . Cancer Brother   . Cancer Brother     Social History History  Substance Use Topics  . Smoking status: Former Smoker    Quit date: 08/11/1981  . Smokeless tobacco: Never Used  . Alcohol Use: Yes     beer- socially    No Known Allergies  Current Outpatient Prescriptions  Medication Sig Dispense Refill  . calcium carbonate 200 MG capsule Take 250 mg by mouth 2 (two) times daily with a meal.        . cholecalciferol (VITAMIN D) 1000 UNITS tablet Take 1,000 Units by mouth daily.        Marland Kitchen doxazosin (CARDURA) 4 MG tablet       . esomeprazole (NEXIUM) 10 MG packet Take 10 mg by mouth daily before breakfast.        . fexofenadine (ALLEGRA) 180 MG tablet Take 180 mg by mouth daily.        . Glucosamine Sulfate ([employer] GLUCOSAMINE SULFATE) 1000 MG CAPS Take 2,000 mg by mouth daily.        Marland Kitchen HYDROcodone-acetaminophen (VICODIN) 5-500 MG per tablet 1 tablet as needed.       Marland Kitchen LYRICA 50 MG capsule       . meloxicam (MOBIC) 15 MG tablet Take 15 mg by mouth  daily.        . montelukast (SINGULAIR) 10 MG tablet Take 10 mg by mouth at bedtime.        . Multiple Vitamin (MULTIVITAMIN) tablet Take 1 tablet by mouth daily.        Marland Kitchen omeprazole (PRILOSEC) 40 MG capsule       . rOPINIRole (REQUIP) 1 MG tablet       . tiZANidine (ZANAFLEX) 4 MG tablet       . zolpidem (AMBIEN) 5 MG tablet Take 5 mg by mouth at bedtime as needed.          Review of Systems Review of Systems  Constitutional: Negative.   Respiratory: Negative.   Cardiovascular: Negative.   Gastrointestinal:       Heartburn.  Genitourinary: Negative for difficulty urinating and testicular pain.  Musculoskeletal: Positive for arthralgias.       Osteoporosis.  Hematological: Negative.     Blood pressure 138/76, pulse 68, temperature 97.6 F (36.4 C), temperature source Temporal, resp. rate 14,  height 5' 7"  (1.702 m), weight 145 lb (65.772 kg).  Physical Exam Physical Exam  Constitutional: He appears well-developed and well-nourished. No distress.  HENT:  Head: Normocephalic and atraumatic.  Neck: Normal range of motion.  Cardiovascular: Normal rate and regular rhythm.   No murmur heard. Pulmonary/Chest: Effort normal and breath sounds normal.  Abdominal: He exhibits no mass. There is no tenderness.       Right lower paramedian scar with attentuation but no obvious hernia.  Genitourinary:       Right inguinal scar with no hernia.  Reducible left inguinal bulge c/w a hernia.  Testicles normal.  Musculoskeletal: Normal range of motion. He exhibits no edema.  Lymphadenopathy:    He has no cervical adenopathy.  Skin: Skin is warm and dry.  Psychiatric: He has a normal mood and affect. His behavior is normal.    Data Reviewed None  Assessment    Sx left inguinal hernia.    Plan    Open left inguinal hernia repair with mesh.  Literature given to him.  I have explained the procedure, risks, and aftercare of inguinal hernia repair.  Risks include but are not limited to  bleeding, infection, wound problems, anesthesia, recurrence, bladder or intestine injury, urinary retention, testicular dysfunction, chronic pain, mesh problems.  He seems to understand and agrees to proceed.       Nishanth Mccaughan J 08/12/2011, 11:24 AM

## 2011-08-13 ENCOUNTER — Encounter (HOSPITAL_COMMUNITY): Payer: Self-pay | Admitting: Pharmacy Technician

## 2011-08-14 ENCOUNTER — Ambulatory Visit (HOSPITAL_COMMUNITY)
Admission: RE | Admit: 2011-08-14 | Discharge: 2011-08-14 | Disposition: A | Discharge status: Home / Self Care | Payer: Medicare Other | Source: Ambulatory Visit | Attending: General Surgery | Admitting: General Surgery

## 2011-08-14 ENCOUNTER — Encounter (HOSPITAL_COMMUNITY): Payer: Self-pay

## 2011-08-14 ENCOUNTER — Encounter (HOSPITAL_COMMUNITY)
Admission: RE | Admit: 2011-08-14 | Discharge: 2011-08-14 | Disposition: A | Discharge status: Home / Self Care | Payer: Medicare Other | Source: Ambulatory Visit | Attending: General Surgery | Admitting: General Surgery

## 2011-08-14 DIAGNOSIS — Z01812 Encounter for preprocedural laboratory examination: Secondary | ICD-10-CM | POA: Insufficient documentation

## 2011-08-14 DIAGNOSIS — Z01818 Encounter for other preprocedural examination: Secondary | ICD-10-CM | POA: Insufficient documentation

## 2011-08-14 HISTORY — DX: Chronic kidney disease, unspecified: N18.9

## 2011-08-14 HISTORY — DX: Unspecified osteoarthritis, unspecified site: M19.90

## 2011-08-14 HISTORY — DX: Benign prostatic hyperplasia without lower urinary tract symptoms: N40.0

## 2011-08-14 LAB — COMPREHENSIVE METABOLIC PANEL
ALT: 12 U/L (ref 0–53)
Albumin: 3.8 g/dL (ref 3.5–5.2)
Alkaline Phosphatase: 60 U/L (ref 39–117)
BUN: 11 mg/dL (ref 6–23)
Chloride: 103 mEq/L (ref 96–112)
GFR calc Af Amer: >90 mL/min (ref >90)
Glucose, Bld: 125 mg/dL — ABNORMAL HIGH (ref 70–99)
Potassium: 5 mEq/L (ref 3.5–5.1)
Sodium: 139 mEq/L (ref 135–145)
Total Bilirubin: 0.5 mg/dL (ref 0.3–1.2)

## 2011-08-14 LAB — SURGICAL PCR SCREEN: MRSA, PCR: NEGATIVE

## 2011-08-14 LAB — CBC
HCT: 37.3 % — ABNORMAL LOW (ref 39.0–52.0)
Hemoglobin: 12.5 g/dL — ABNORMAL LOW (ref 13.0–17.0)
MCH: 29.8 pg (ref 26.0–34.0)
MCV: 88.8 fL (ref 78.0–100.0)
RBC: 4.2 MIL/uL — ABNORMAL LOW (ref 4.22–5.81)

## 2011-08-14 MED ORDER — CEFAZOLIN SODIUM 1-5 GM-% IV SOLN: 1.0000 g | INTRAVENOUS | Status: DC

## 2011-08-14 NOTE — Pre-Procedure Instructions (Signed)
08-14-2011 Spoke with patients wife and gave her instructions for surgery change 08-19-2011 with Dr. Abbey Chatters -arrive at [facility] on 08-19-2011 at 0730-NPO after midnight and may take am of surgery with a sip of water Prilosec and Vicodin if needed;reinforced Hibiclens shower night before surgery and am of surgery.  PCR screen pending from 08-14-2011; labs-CBC,CMET,PT/PTT, INR ,CXR  Results in [facility]

## 2011-08-14 NOTE — Pre-Procedure Instructions (Signed)
08-14-2011 Spoke with patients wife and gave her instructions for surgery change 08-19-2011 with Dr. Rosenbower -arrive at [facility] on 08-19-2011 at 0730-NPO after midnight and may take am of surgery with a sip of water Prilosec and Vicodin if needed;reinforced Hibiclens shower night before surgery and am of surgery.  PCR screen pending from 08-14-2011; labs-CBC,CMET,PT/PTT, INR ,CXR  Results in [facility] 

## 2011-08-14 NOTE — Pre-Procedure Instructions (Addendum)
20 DAMASO LADAY  08/14/2011   Your procedure is scheduled on:  Nov 6 tues  Report to [facility] at 0730am .  Call this number if you have problems the morning of surgery: [phone]   Remember:   Do not eat food:After Midnight.  Do not drink clear liquids: 4 Hours before arrival.  Take these medicines the morning of surgery with A SIP OF WATER: hydrocodone, lyrica,prilosec   Do not wear jewelry, make-up or nail polish.  Do not wear lotions, powders, or perfumes. You may wear deodorant.  Do not shave 48 hours prior to surgery.  Do not bring valuables to the hospital.  Contacts, dentures or bridgework may not be worn into surgery.  Leave suitcase in the car. After surgery it may be brought to your room.  For patients admitted to the hospital, checkout time is 11:00 AM the day of discharge.   Patients discharged the day of surgery will not be allowed to drive home.  Name and phone number of your driver: family Special Instructions: CHG Shower Use Special Wash: 1/2 bottle night before surgery and 1/2 bottle morning of surgery.   Please read over the following fact sheets that you were given: Pain Booklet, MRSA Information and Surgical Site Infection Prevention

## 2011-08-18 NOTE — Pre-Procedure Instructions (Signed)
08/18/11- notified wife of time change and to be at short stay at [facility] at at [facility] 08/19/11

## 2011-08-19 ENCOUNTER — Encounter (HOSPITAL_COMMUNITY): Payer: Self-pay | Admitting: Anesthesiology

## 2011-08-19 ENCOUNTER — Ambulatory Visit (HOSPITAL_COMMUNITY)
Admission: RE | Admit: 2011-08-19 | Discharge: 2011-08-19 | Disposition: A | Discharge status: Home / Self Care | Payer: Medicare Other | Source: Ambulatory Visit | Attending: General Surgery | Admitting: General Surgery

## 2011-08-19 ENCOUNTER — Encounter (HOSPITAL_COMMUNITY): Payer: Self-pay | Admitting: *Deleted

## 2011-08-19 ENCOUNTER — Encounter (HOSPITAL_COMMUNITY)
Admission: RE | Disposition: A | Discharge status: Home / Self Care | Payer: Self-pay | Source: Ambulatory Visit | Attending: General Surgery

## 2011-08-19 DIAGNOSIS — Z01812 Encounter for preprocedural laboratory examination: Secondary | ICD-10-CM | POA: Insufficient documentation

## 2011-08-19 DIAGNOSIS — K4090 Unilateral inguinal hernia, without obstruction or gangrene, not specified as recurrent: Secondary | ICD-10-CM | POA: Insufficient documentation

## 2011-08-19 DIAGNOSIS — K219 Gastro-esophageal reflux disease without esophagitis: Secondary | ICD-10-CM | POA: Insufficient documentation

## 2011-08-19 DIAGNOSIS — K449 Diaphragmatic hernia without obstruction or gangrene: Secondary | ICD-10-CM | POA: Insufficient documentation

## 2011-08-19 HISTORY — PX: INGUINAL HERNIA REPAIR: SHX194

## 2011-08-19 SURGERY — REPAIR, HERNIA, INGUINAL, ADULT
Anesthesia: General | Site: Abdomen | Laterality: Left | Wound class: Clean

## 2011-08-19 MED ORDER — FENTANYL CITRATE 0.05 MG/ML IJ SOLN
INTRAMUSCULAR | Status: DC | PRN
  Administered 2011-08-19: 50 ug via INTRAVENOUS

## 2011-08-19 MED ORDER — OXYCODONE-ACETAMINOPHEN 5-325 MG PO TABS
ORAL_TABLET | ORAL | Status: AC
  Filled 2011-08-19: qty 1

## 2011-08-19 MED ORDER — CEFAZOLIN SODIUM 1-5 GM-% IV SOLN
INTRAVENOUS | Status: AC
  Filled 2011-08-19: qty 50

## 2011-08-19 MED ORDER — OXYCODONE-ACETAMINOPHEN 5-325 MG PO TABS: 1.0000 | ORAL_TABLET | ORAL | Status: DC | PRN

## 2011-08-19 MED ORDER — BUPIVACAINE LIPOSOME 1.3 % IJ SUSP
20.0000 mL | Freq: Once | INTRAMUSCULAR | Status: AC
  Administered 2011-08-19: 20 mL
  Filled 2011-08-19: qty 20

## 2011-08-19 MED ORDER — SODIUM CHLORIDE 0.9 % IR SOLN
Status: DC | PRN
  Administered 2011-08-19: 1000 mL

## 2011-08-19 MED ORDER — ONDANSETRON HCL 4 MG/2ML IJ SOLN
INTRAMUSCULAR | Status: DC | PRN
  Administered 2011-08-19: 4 mg via INTRAVENOUS

## 2011-08-19 MED ORDER — LIDOCAINE HCL (CARDIAC) 20 MG/ML IV SOLN
INTRAVENOUS | Status: DC | PRN
  Administered 2011-08-19: 100 mg via INTRAVENOUS

## 2011-08-19 MED ORDER — SODIUM CHLORIDE 0.9 % IJ SOLN
INTRAMUSCULAR | Status: DC | PRN
  Administered 2011-08-19: 10 mL

## 2011-08-19 MED ORDER — ACETAMINOPHEN 10 MG/ML IV SOLN
INTRAVENOUS | Status: DC | PRN
  Administered 2011-08-19: 1000 mg via INTRAVENOUS

## 2011-08-19 MED ORDER — ACETAMINOPHEN 10 MG/ML IV SOLN
INTRAVENOUS | Status: AC
  Filled 2011-08-19: qty 100

## 2011-08-19 MED ORDER — CEFAZOLIN SODIUM 1-5 GM-% IV SOLN
1.0000 g | INTRAVENOUS | Status: AC
  Administered 2011-08-19: 1 g via INTRAVENOUS

## 2011-08-19 MED ORDER — OXYCODONE-ACETAMINOPHEN 5-325 MG PO TABS: 1.0000 | ORAL_TABLET | ORAL | Status: AC | PRN

## 2011-08-19 MED ORDER — LACTATED RINGERS IV SOLN
INTRAVENOUS | Status: DC
  Administered 2011-08-19: 10:00:00 via INTRAVENOUS
  Administered 2011-08-19: 1000 mL via INTRAVENOUS
  Administered 2011-08-19: 11:00:00 via INTRAVENOUS

## 2011-08-19 MED ORDER — PROPOFOL 10 MG/ML IV EMUL
INTRAVENOUS | Status: DC | PRN
  Administered 2011-08-19: 150 mg via INTRAVENOUS

## 2011-08-19 MED ORDER — ATROPINE SULFATE 0.4 MG/ML IJ SOLN
INTRAMUSCULAR | Status: DC | PRN
  Administered 2011-08-19: .6 mg via INTRAVENOUS

## 2011-08-19 SURGICAL SUPPLY — 43 items
BENZOIN TINCTURE PRP APPL 2/3 (GAUZE/BANDAGES/DRESSINGS) ×2 IMPLANT
BLADE HEX COATED 2.75 (ELECTRODE) ×2 IMPLANT
BLADE SURG 15 STRL LF DISP TIS (BLADE) ×1 IMPLANT
BLADE SURG 15 STRL SS (BLADE) ×1
BLADE SURG SZ10 CARB STEEL (BLADE) ×2 IMPLANT
CANISTER SUCTION 2500CC (MISCELLANEOUS) ×2 IMPLANT
CLOSURE STERI STRIP 1/2 X4 (GAUZE/BANDAGES/DRESSINGS) ×2 IMPLANT
CLOTH BEACON ORANGE TIMEOUT ST (SAFETY) ×2 IMPLANT
DECANTER SPIKE VIAL GLASS SM (MISCELLANEOUS) ×2 IMPLANT
DRAIN PENROSE 18X1/2 LTX STRL (DRAIN) ×2 IMPLANT
DRAPE INCISE IOBAN 66X45 STRL (DRAPES) ×2 IMPLANT
DRAPE LAPAROTOMY TRNSV 102X78 (DRAPE) ×2 IMPLANT
DRAPE UTILITY XL STRL (DRAPES) ×2 IMPLANT
DRESSING TELFA 8X3 (GAUZE/BANDAGES/DRESSINGS) ×2 IMPLANT
DRSG TEGADERM 2-3/8X2-3/4 SM (GAUZE/BANDAGES/DRESSINGS) IMPLANT
DRSG TEGADERM 4X4.75 (GAUZE/BANDAGES/DRESSINGS) ×2 IMPLANT
ELECT REM PT RETURN 9FT ADLT (ELECTROSURGICAL) ×2
ELECTRODE REM PT RTRN 9FT ADLT (ELECTROSURGICAL) ×1 IMPLANT
GLOVE ECLIPSE 8.0 STRL XLNG CF (GLOVE) ×2 IMPLANT
GLOVE INDICATOR 8.0 STRL GRN (GLOVE) ×4 IMPLANT
GOWN STRL NON-REIN LRG LVL3 (GOWN DISPOSABLE) ×2 IMPLANT
GOWN STRL REIN XL XLG (GOWN DISPOSABLE) ×2 IMPLANT
KIT BASIN OR (CUSTOM PROCEDURE TRAY) ×2 IMPLANT
MESH HERNIA 3X6 (Mesh General) ×2 IMPLANT
NEEDLE HYPO 25X1 1.5 SAFETY (NEEDLE) ×2 IMPLANT
NS IRRIG 1000ML POUR BTL (IV SOLUTION) ×2 IMPLANT
PACK BASIC VI WITH GOWN DISP (CUSTOM PROCEDURE TRAY) ×2 IMPLANT
PENCIL BUTTON HOLSTER BLD 10FT (ELECTRODE) ×2 IMPLANT
SPONGE GAUZE 4X4 12PLY (GAUZE/BANDAGES/DRESSINGS) ×2 IMPLANT
SPONGE LAP 4X18 X RAY DECT (DISPOSABLE) ×2 IMPLANT
STRIP CLOSURE SKIN 1/2X4 (GAUZE/BANDAGES/DRESSINGS) ×2 IMPLANT
SUT MNCRL AB 4-0 PS2 18 (SUTURE) ×2 IMPLANT
SUT PROLENE 2 0 CT2 30 (SUTURE) ×4 IMPLANT
SUT VIC AB 2-0 SH 18 (SUTURE) ×2 IMPLANT
SUT VIC AB 3-0 54XBRD REEL (SUTURE) ×1 IMPLANT
SUT VIC AB 3-0 BRD 54 (SUTURE) ×1
SUT VIC AB 3-0 SH 27 (SUTURE) ×2
SUT VIC AB 3-0 SH 27XBRD (SUTURE) ×2 IMPLANT
SYR BULB IRRIGATION 50ML (SYRINGE) ×2 IMPLANT
SYR CONTROL 10ML LL (SYRINGE) ×2 IMPLANT
TOWEL OR 17X26 10 PK STRL BLUE (TOWEL DISPOSABLE) ×2 IMPLANT
TOWEL OR NON WOVEN STRL DISP B (DISPOSABLE) ×2 IMPLANT
YANKAUER SUCT BULB TIP 10FT TU (MISCELLANEOUS) ×2 IMPLANT

## 2011-08-19 NOTE — Anesthesia Postprocedure Evaluation (Signed)
  Anesthesia Post-op Note  Patient: [name]  Procedure(s) Performed:  HERNIA REPAIR INGUINAL ADULT  Patient Location: PACU  Anesthesia Type: General  Level of Consciousness: awake, alert , oriented and patient cooperative  Airway and Oxygen Therapy: Patient Spontanous Breathing  Post-op Pain: mild  Post-op Assessment: Post-op Vital signs reviewed, Patient's Cardiovascular Status Stable, Respiratory Function Stable, Patent Airway, No signs of Nausea or vomiting and Pain level controlled  Post-op Vital Signs: Reviewed and stable  Complications: No apparent anesthesia complications

## 2011-08-19 NOTE — Progress Notes (Signed)
4x4 covered with tegaderm

## 2011-08-19 NOTE — H&P (Signed)
WILHELM GANAWAY   08/12/2011 10:10 AM Office Visit  MRN: [id number]   Description: 76 year old male  Provider: Odis Hollingshead, MD  Department: [facility]        Diagnoses     Inguinal hernia without mention of obstruction or gangrene, unilateral or unspecified, (not specified as recurrent)   - Primary    550.90      Reason for Visit     Other    Eval of left inguinal hernia        Vitals - Last Recorded       BP Pulse Temp(Src) Resp Ht Wt    138/76  68  97.6 F (36.4 C) (Temporal)  14  5' 7"  (1.702 m)  145 lb (65.772 kg)          BMI              22.71 kg/m2                 Progress Notes     Odis Hollingshead, MD  08/12/2011 12:48 PM  SignedChief Complaint   Patient presents with   .  Other       Eval of left inguinal hernia      HPI AXCEL HORSCH is a 76 y.o. male.   HPI  He is referred by Dr. Hilma Favors for evaluation and treatment of a left inguinal hernia.  He had the recent onset of a left inguinal bulge that has been getting larger and more uncomfortable.  He is wearing hernia underwear and this helps with the discomfort.  No obstructive symptoms.  No difficulty with urination.  No constipation.  He had a right inguinal hernia repaired in 09/1998.    Past Medical History   Diagnosis  Date   .  IBS (irritable bowel syndrome)     .  Full dentures     .  Ulcer     .  Hemorrhoids     .  GERD (gastroesophageal reflux disease)     .  Osteoporosis     .  Hiatal hernia         Past Surgical History   Procedure  Date   .  Appendectomy  1963   .  Hernia repair  2002       right inguinal        Family History   Problem  Relation  Age of Onset   .  Cancer  Mother     .  Stroke  Father     .  Cancer  Sister     .  Cancer  Sister     .  Cancer  Brother     .  Cancer  Brother        Social History History   Substance Use Topics   .  Smoking status:  Former Smoker       Quit date:  08/11/1981   .  Smokeless tobacco:  Never Used   .   Alcohol Use:  Yes         beer- socially      No Known Allergies    Current Outpatient Prescriptions   Medication  Sig  Dispense  Refill   .  calcium carbonate 200 MG capsule  Take 250 mg by mouth 2 (two) times daily with a meal.           .  cholecalciferol (VITAMIN D) 1000 UNITS tablet  Take 1,000  Units by mouth daily.           Marland Kitchen  doxazosin (CARDURA) 4 MG tablet           .  esomeprazole (NEXIUM) 10 MG packet  Take 10 mg by mouth daily before breakfast.           .  fexofenadine (ALLEGRA) 180 MG tablet  Take 180 mg by mouth daily.           .  Glucosamine Sulfate ([employer] GLUCOSAMINE SULFATE) 1000 MG CAPS  Take 2,000 mg by mouth daily.           Marland Kitchen  HYDROcodone-acetaminophen (VICODIN) 5-500 MG per tablet  1 tablet as needed.          Marland Kitchen  LYRICA 50 MG capsule           .  meloxicam (MOBIC) 15 MG tablet  Take 15 mg by mouth daily.           .  montelukast (SINGULAIR) 10 MG tablet  Take 10 mg by mouth at bedtime.           .  Multiple Vitamin (MULTIVITAMIN) tablet  Take 1 tablet by mouth daily.           Marland Kitchen  omeprazole (PRILOSEC) 40 MG capsule           .  rOPINIRole (REQUIP) 1 MG tablet           .  tiZANidine (ZANAFLEX) 4 MG tablet           .  zolpidem (AMBIEN) 5 MG tablet  Take 5 mg by mouth at bedtime as needed.              Review of Systems Review of Systems  Constitutional: Negative.   Respiratory: Negative.   Cardiovascular: Negative.   Gastrointestinal:        Heartburn.  Genitourinary: Negative for difficulty urinating and testicular pain.  Musculoskeletal: Positive for arthralgias.        Osteoporosis.  Hematological: Negative.     Blood pressure 138/76, pulse 68, temperature 97.6 F (36.4 C), temperature source Temporal, resp. rate 14, height 5' 7"  (1.702 m), weight 145 lb (65.772 kg).   Physical Exam Physical Exam  Constitutional: He appears well-developed and well-nourished. No distress.  HENT:   Head: Normocephalic and atraumatic.  Neck: Normal range of  motion.  Cardiovascular: Normal rate and regular rhythm.    No murmur heard. Pulmonary/Chest: Effort normal and breath sounds normal.  Abdominal: He exhibits no mass. There is no tenderness.       Right lower paramedian scar with attentuation but no obvious hernia.  Genitourinary:       Right inguinal scar with no hernia.  Reducible left inguinal bulge c/w a hernia.  Testicles normal.  Musculoskeletal: Normal range of motion. He exhibits no edema.  Lymphadenopathy:    He has no cervical adenopathy.  Skin: Skin is warm and dry.  Psychiatric: He has a normal mood and affect. His behavior is normal.    Data Reviewed None   Assessment Sx left inguinal hernia.   Plan Open left inguinal hernia repair with mesh.  Literature given to him.   I have explained the procedure, risks, and aftercare of inguinal hernia repair.  Risks include but are not limited to bleeding, infection, wound problems, anesthesia, recurrence, bladder or intestine injury, urinary retention, testicular dysfunction, chronic pain, mesh problems.  He seems to understand and agrees to proceed.  Coy Rochford J 08/12/2011, 11:24 AM                Not recorded       Discontinued Medications         Reason for Discontinue    glucosamine-chondroitin 500-400 MG tablet Error    azithromycin (ZITHROMAX) 250 MG tablet Error    fish oil-omega-3 fatty acids 1000 MG capsule Error            Level of Service     PR OFFICE CONSULTATION,LEVEL III [37482]      Follow-up and Disposition     Return if symptoms worsen or fail to improve.        All Flowsheet Templates (all recorded)     Encounter Vitals Flowsheet    Custom Formula Data Flowsheet    Anthropometrics Flowsheet                      All Charges for This Encounter       Code Description Service Date Service Provider Modifiers Quantity    [phone] PR OFFICE/OUTPT VISIT,NEW,LEVL III 08/12/2011 Odis Hollingshead, MD   1     [phone] PR CURRENT TOBACCO NON-USER 08/12/2011 Odis Hollingshead, MD   1        Other Encounter Related Information     Allergies & Medications         Problem List         History         Patient-Entered Questionnaires     No data filed

## 2011-08-19 NOTE — Anesthesia Preprocedure Evaluation (Addendum)
Anesthesia Evaluation  Patient identified by MRN, date of birth, ID band Patient awake    Reviewed: Allergy & Precautions, H&P , NPO status , Patient's Chart, lab work & pertinent test results  History of Anesthesia Complications Negative for: history of anesthetic complications  Airway Mallampati: II TM Distance: >3 FB Neck ROM: Full    Dental  (+) Implants, Teeth Intact and Dental Advisory Given   Pulmonary neg pulmonary ROS, with exertion,    Pulmonary exam normal       Cardiovascular neg cardio ROS     Neuro/Psych Negative Neurological ROS  Negative Psych ROS   GI/Hepatic Neg liver ROS, hiatal hernia, PUD, GERD-  ,  Endo/Other  Negative Endocrine ROS  Renal/GU negative Renal ROS  Genitourinary negative   Musculoskeletal negative musculoskeletal ROS (+)   Abdominal Normal abdominal exam  (+)   Peds  Hematology negative hematology ROS (+)   Anesthesia Other Findings   Reproductive/Obstetrics                          Anesthesia Physical Anesthesia Plan  ASA: II  Anesthesia Plan: General   Post-op Pain Management:    Induction:   Airway Management Planned: LMA  Additional Equipment:   Intra-op Plan:   Post-operative Plan:   Informed Consent: I have reviewed the patients History and Physical, chart, labs and discussed the procedure including the risks, benefits and alternatives for the proposed anesthesia with the patient or authorized representative who has indicated his/her understanding and acceptance.   Dental advisory given  Plan Discussed with: CRNA and Surgeon  Anesthesia Plan Comments:         Anesthesia Quick Evaluation

## 2011-08-19 NOTE — Transfer of Care (Signed)
Immediate Anesthesia Transfer of Care Note  Patient: [name]  Procedure(s) Performed:  HERNIA REPAIR INGUINAL ADULT  Patient Location: PACU  Anesthesia Type: General  Level of Consciousness: awake, oriented, patient cooperative and responds to stimulation  Airway & Oxygen Therapy: Patient Spontanous Breathing and Patient connected to face mask oxygen  Post-op Assessment: Report given to PACU RN, Post -op Vital signs reviewed and stable and Patient moving all extremities X 4  Post vital signs: Reviewed and stable  Complications: No apparent anesthesia complications

## 2011-08-19 NOTE — Interval H&P Note (Signed)
History and Physical Interval Note:   08/19/2011   9:19 AM   [name]  has presented today for surgery, with the diagnosis of left inguinal hernia  The various methods of treatment have been discussed with the patient and family. After consideration of risks, benefits and other options for treatment, the patient has consented to  Procedure(s): HERNIA REPAIR INGUINAL ADULT as a surgical intervention .  The patients' history has been reviewed, patient examined, no change in status, stable for surgery.  I have reviewed the patients' chart and labs.  Questions were answered to the patient's satisfaction.     Odis Hollingshead  MD   History and Physical Interval Note:   08/19/2011   9:19 AM   [name]  has presented today for surgery, with the diagnosis of left inguinal hernia  The various methods of treatment have been discussed with the patient and family. After consideration of risks, benefits and other options for treatment, the patient has consented to  Procedure(s): HERNIA REPAIR INGUINAL ADULT as a surgical intervention .  The patients' history has been reviewed, patient examined, no change in status, stable for surgery.  I have reviewed the patients' chart and labs.  Questions were answered to the patient's satisfaction.     Odis Hollingshead  MD

## 2011-08-19 NOTE — Interval H&P Note (Signed)
History and Physical Interval Note:   08/19/2011   9:18 AM   [name]  has presented today for surgery, with the diagnosis of left inguinal hernia  The various methods of treatment have been discussed with the patient and family. After consideration of risks, benefits and other options for treatment, the patient has consented to  Procedure(s): HERNIA REPAIR INGUINAL ADULT as a surgical intervention .  The patients' history has been reviewed, patient examined, no change in status, stable for surgery.  I have reviewed the patients' chart and labs.  Questions were answered to the patient's satisfaction.     Odis Hollingshead  MD       History and Physical Interval Note:   08/19/2011   9:18 AM   [name]  has presented today for surgery, with the diagnosis of left inguinal hernia  The various methods of treatment have been discussed with the patient and family. After consideration of risks, benefits and other options for treatment, the patient has consented to  Procedure(s): HERNIA REPAIR INGUINAL ADULT as a surgical intervention .  The patients' history has been reviewed, patient examined, no change in status, stable for surgery.  I have reviewed the patients' chart and labs.  Questions were answered to the patient's satisfaction.     Odis Hollingshead  MD

## 2011-08-19 NOTE — Preoperative (Signed)
Beta Blockers   Reason not to administer Beta Blockers:Not Applicable 

## 2011-08-19 NOTE — Op Note (Signed)
Preoperative diagnosis:  Left inguinal hernia.  Postoperative diagnosis:  Same (indirect)  Procedure:  Left inguinal hernia repair with mesh.  Surgeon:  Jackolyn Confer, M.D.  Anesthesia:  General/LMA with local (Exparel).  Indication:   [name] is a 76 year old male with a painful left inguinal hernia. He now presents for repair. The procedure, risks, and after care were discussed with him preoperatively.  Technique:  The patient was seen in the holding room and the left groin was marked with my initials. The patient was brought to the operating room, placed supine on the operating table, and the anesthetic was administered by the anesthesiologist. The hair in the groin area was clipped as was felt to be necessary. This area was then sterilely prepped and draped.  Local anesthetic was infiltrated in the superficial and deep tissues in the left groin.  An incision was made through the skin and subcutaneous tissue until the external oblique aponeurosis was identified.  Local anesthetic was infiltrated deep to the external oblique aponeurosis. The external oblique aponeurosis was divided through the external ring medially and back toward the anterior superior iliac spine laterally. Using blunt dissection, the shelving edge of the inguinal ligament was identified inferiorly and the internal oblique aponeurosis and muscle were identified superiorly. The ilioinguinal nerve was identified and preserved.  The spermatic cord was isolated and a posterior window was made around it. An indirect hernia sac was identified and separated from the spermatic cord using blunt dissection. The hernia sac and its contents were reduced the indirect hernia defect.   A piece of 3" x 6" polypropylene mesh was brought into the field and anchored 1-2 cm medial to the pubic tubercle with 2-0 Prolene suture. The inferior aspect of the mesh was anchored to the shelving edge of the inguinal ligament with running 2-0 Prolene  suture to a level 1-2 cm lateral to the internal ring. A slit was cut in the mesh creating 2 tails. These were wrapped around the spermatic cord. The superior aspect of the mesh was anchored to the internal oblique aponeurosis and muscle with interrupted 2-0 Vicryl sutures. The 2 tails of the mesh were then crossed creating a new internal ring and were anchored to the shelving edge of the inguinal ligament with 2-0 Prolene suture. The tip of a hemostat could be placed through the new aperture. The lateral aspect of the mesh was then tucked deep to the external oblique aponeurosis.  The wound was inspected and hemostasis was adequate. The external oblique aponeurosis was then closed over the mesh and cord with running 3-0 Vicryl suture. The subcutaneous tissue was closed with running 3-0 Vicryl suture. The skin closed with a running 4-0 Monocryl subcuticular stitch.  Steri-Strips and a sterile dressing were applied.  The procedure was well-tolerated without any apparent complications and the patient was taken to the recovery room in satisfactory condition.

## 2011-08-27 ENCOUNTER — Telehealth (INDEPENDENT_AMBULATORY_CARE_PROVIDER_SITE_OTHER): Payer: Self-pay | Admitting: General Surgery

## 2011-08-27 NOTE — Telephone Encounter (Signed)
Called in the Vicodin pro call to the [employer]. [phone]

## 2011-09-01 ENCOUNTER — Encounter (HOSPITAL_COMMUNITY): Payer: Self-pay | Admitting: General Surgery

## 2011-09-02 ENCOUNTER — Ambulatory Visit (INDEPENDENT_AMBULATORY_CARE_PROVIDER_SITE_OTHER): Payer: Medicare Other | Admitting: Urology

## 2011-09-02 DIAGNOSIS — Z125 Encounter for screening for malignant neoplasm of prostate: Secondary | ICD-10-CM

## 2011-09-02 DIAGNOSIS — N400 Enlarged prostate without lower urinary tract symptoms: Secondary | ICD-10-CM

## 2011-09-02 DIAGNOSIS — N529 Male erectile dysfunction, unspecified: Secondary | ICD-10-CM

## 2011-09-15 ENCOUNTER — Ambulatory Visit (INDEPENDENT_AMBULATORY_CARE_PROVIDER_SITE_OTHER): Payer: Medicare Other | Admitting: General Surgery

## 2011-09-15 ENCOUNTER — Encounter (INDEPENDENT_AMBULATORY_CARE_PROVIDER_SITE_OTHER): Payer: Self-pay | Admitting: General Surgery

## 2011-09-15 ENCOUNTER — Other Ambulatory Visit (INDEPENDENT_AMBULATORY_CARE_PROVIDER_SITE_OTHER): Payer: Self-pay | Admitting: General Surgery

## 2011-09-15 VITALS — BP 138/80 | HR 72 | Temp 98.3°F | Resp 16 | Ht 67.0 in | Wt 144.4 lb

## 2011-09-15 DIAGNOSIS — K4090 Unilateral inguinal hernia, without obstruction or gangrene, not specified as recurrent: Secondary | ICD-10-CM

## 2011-09-15 DIAGNOSIS — Z9889 Other specified postprocedural states: Secondary | ICD-10-CM

## 2011-09-15 MED ORDER — OXYCODONE-ACETAMINOPHEN 5-500 MG PO TABS: 1.0000 | ORAL_TABLET | Freq: Four times a day (QID) | ORAL | Status: AC | PRN

## 2011-09-15 NOTE — Progress Notes (Signed)
Operation: Left inguinal hernia repair with mesh  Date: August 19, 2011  Pathology: Not done  HPI:  Mr. Westrup is here for his first postoperative visit. The soreness is slowly getting better. He has minimal swelling. He is voiding well. His bowels are moving well.   Physical Exam: Left groin-incision is clean dry and intact. Hernia repair is solid. Mild testicular swelling.  Assessment:  Doing well post hernia repair. Still with some soreness.  Plan: Try to resume normal activities as tolerated in 2 weeks. Return visit as needed.

## 2011-09-15 NOTE — Patient Instructions (Signed)
Continued light activities for 2 more weeks and then resume her normal activities as tolerated.

## 2011-10-16 DIAGNOSIS — IMO0002 Reserved for concepts with insufficient information to code with codable children: Secondary | ICD-10-CM | POA: Diagnosis not present

## 2011-10-16 DIAGNOSIS — R972 Elevated prostate specific antigen [PSA]: Secondary | ICD-10-CM | POA: Diagnosis not present

## 2011-10-16 DIAGNOSIS — E785 Hyperlipidemia, unspecified: Secondary | ICD-10-CM | POA: Diagnosis not present

## 2011-10-16 DIAGNOSIS — R079 Chest pain, unspecified: Secondary | ICD-10-CM | POA: Diagnosis not present

## 2011-10-16 DIAGNOSIS — Z125 Encounter for screening for malignant neoplasm of prostate: Secondary | ICD-10-CM | POA: Diagnosis not present

## 2011-10-16 DIAGNOSIS — D510 Vitamin B12 deficiency anemia due to intrinsic factor deficiency: Secondary | ICD-10-CM | POA: Diagnosis not present

## 2011-10-16 DIAGNOSIS — Z0000 Encounter for general adult medical examination without abnormal findings: Secondary | ICD-10-CM | POA: Diagnosis not present

## 2011-11-24 DIAGNOSIS — Z0000 Encounter for general adult medical examination without abnormal findings: Secondary | ICD-10-CM | POA: Diagnosis not present

## 2012-01-26 DIAGNOSIS — L821 Other seborrheic keratosis: Secondary | ICD-10-CM | POA: Diagnosis not present

## 2012-01-26 DIAGNOSIS — L570 Actinic keratosis: Secondary | ICD-10-CM | POA: Diagnosis not present

## 2012-05-05 DIAGNOSIS — IMO0002 Reserved for concepts with insufficient information to code with codable children: Secondary | ICD-10-CM | POA: Diagnosis not present

## 2012-05-05 DIAGNOSIS — D510 Vitamin B12 deficiency anemia due to intrinsic factor deficiency: Secondary | ICD-10-CM | POA: Diagnosis not present

## 2012-05-05 DIAGNOSIS — M25579 Pain in unspecified ankle and joints of unspecified foot: Secondary | ICD-10-CM | POA: Diagnosis not present

## 2012-05-05 DIAGNOSIS — G8929 Other chronic pain: Secondary | ICD-10-CM | POA: Diagnosis not present

## 2012-05-10 DIAGNOSIS — B359 Dermatophytosis, unspecified: Secondary | ICD-10-CM | POA: Diagnosis not present

## 2012-07-26 DIAGNOSIS — D485 Neoplasm of uncertain behavior of skin: Secondary | ICD-10-CM | POA: Diagnosis not present

## 2012-07-26 DIAGNOSIS — L570 Actinic keratosis: Secondary | ICD-10-CM | POA: Diagnosis not present

## 2012-07-26 DIAGNOSIS — L820 Inflamed seborrheic keratosis: Secondary | ICD-10-CM | POA: Diagnosis not present

## 2012-07-26 DIAGNOSIS — L819 Disorder of pigmentation, unspecified: Secondary | ICD-10-CM | POA: Diagnosis not present

## 2012-08-27 ENCOUNTER — Encounter: Payer: Self-pay | Admitting: Cardiology

## 2012-08-31 ENCOUNTER — Ambulatory Visit (INDEPENDENT_AMBULATORY_CARE_PROVIDER_SITE_OTHER): Payer: Medicare Other | Admitting: Urology

## 2012-08-31 DIAGNOSIS — N400 Enlarged prostate without lower urinary tract symptoms: Secondary | ICD-10-CM

## 2012-08-31 DIAGNOSIS — N529 Male erectile dysfunction, unspecified: Secondary | ICD-10-CM | POA: Diagnosis not present

## 2012-08-31 DIAGNOSIS — Z125 Encounter for screening for malignant neoplasm of prostate: Secondary | ICD-10-CM

## 2012-09-06 DIAGNOSIS — IMO0002 Reserved for concepts with insufficient information to code with codable children: Secondary | ICD-10-CM | POA: Diagnosis not present

## 2012-09-06 DIAGNOSIS — Z23 Encounter for immunization: Secondary | ICD-10-CM | POA: Diagnosis not present

## 2012-09-06 DIAGNOSIS — E538 Deficiency of other specified B group vitamins: Secondary | ICD-10-CM | POA: Diagnosis not present

## 2012-09-06 DIAGNOSIS — G8929 Other chronic pain: Secondary | ICD-10-CM | POA: Diagnosis not present

## 2012-09-06 DIAGNOSIS — G4700 Insomnia, unspecified: Secondary | ICD-10-CM | POA: Diagnosis not present

## 2012-12-02 ENCOUNTER — Encounter: Payer: Self-pay | Admitting: Cardiology

## 2013-01-24 DIAGNOSIS — L570 Actinic keratosis: Secondary | ICD-10-CM | POA: Diagnosis not present

## 2013-01-24 DIAGNOSIS — L821 Other seborrheic keratosis: Secondary | ICD-10-CM | POA: Diagnosis not present

## 2013-02-23 DIAGNOSIS — Z0000 Encounter for general adult medical examination without abnormal findings: Secondary | ICD-10-CM | POA: Diagnosis not present

## 2013-02-23 DIAGNOSIS — R7301 Impaired fasting glucose: Secondary | ICD-10-CM | POA: Diagnosis not present

## 2013-02-23 DIAGNOSIS — E785 Hyperlipidemia, unspecified: Secondary | ICD-10-CM | POA: Diagnosis not present

## 2013-02-23 DIAGNOSIS — IMO0002 Reserved for concepts with insufficient information to code with codable children: Secondary | ICD-10-CM | POA: Diagnosis not present

## 2013-02-23 DIAGNOSIS — E538 Deficiency of other specified B group vitamins: Secondary | ICD-10-CM | POA: Diagnosis not present

## 2013-02-23 DIAGNOSIS — Z23 Encounter for immunization: Secondary | ICD-10-CM | POA: Diagnosis not present

## 2013-02-23 DIAGNOSIS — D510 Vitamin B12 deficiency anemia due to intrinsic factor deficiency: Secondary | ICD-10-CM | POA: Diagnosis not present

## 2013-02-23 DIAGNOSIS — Z125 Encounter for screening for malignant neoplasm of prostate: Secondary | ICD-10-CM | POA: Diagnosis not present

## 2013-03-08 DIAGNOSIS — K900 Celiac disease: Secondary | ICD-10-CM | POA: Diagnosis not present

## 2013-03-08 DIAGNOSIS — D508 Other iron deficiency anemias: Secondary | ICD-10-CM | POA: Diagnosis not present

## 2013-03-17 DIAGNOSIS — K900 Celiac disease: Secondary | ICD-10-CM | POA: Diagnosis not present

## 2013-03-17 DIAGNOSIS — D508 Other iron deficiency anemias: Secondary | ICD-10-CM | POA: Diagnosis not present

## 2013-03-17 DIAGNOSIS — D649 Anemia, unspecified: Secondary | ICD-10-CM | POA: Diagnosis not present

## 2013-03-17 DIAGNOSIS — R1310 Dysphagia, unspecified: Secondary | ICD-10-CM | POA: Diagnosis not present

## 2013-03-17 DIAGNOSIS — K319 Disease of stomach and duodenum, unspecified: Secondary | ICD-10-CM | POA: Diagnosis not present

## 2013-03-17 DIAGNOSIS — R1319 Other dysphagia: Secondary | ICD-10-CM | POA: Diagnosis not present

## 2013-06-01 DIAGNOSIS — D539 Nutritional anemia, unspecified: Secondary | ICD-10-CM | POA: Diagnosis not present

## 2013-07-13 DIAGNOSIS — D485 Neoplasm of uncertain behavior of skin: Secondary | ICD-10-CM | POA: Diagnosis not present

## 2013-07-13 DIAGNOSIS — L821 Other seborrheic keratosis: Secondary | ICD-10-CM | POA: Diagnosis not present

## 2013-07-13 DIAGNOSIS — L570 Actinic keratosis: Secondary | ICD-10-CM | POA: Diagnosis not present

## 2013-08-03 DIAGNOSIS — G8929 Other chronic pain: Secondary | ICD-10-CM | POA: Diagnosis not present

## 2013-08-03 DIAGNOSIS — Z23 Encounter for immunization: Secondary | ICD-10-CM | POA: Diagnosis not present

## 2013-08-03 DIAGNOSIS — IMO0002 Reserved for concepts with insufficient information to code with codable children: Secondary | ICD-10-CM | POA: Diagnosis not present

## 2013-08-03 DIAGNOSIS — D510 Vitamin B12 deficiency anemia due to intrinsic factor deficiency: Secondary | ICD-10-CM | POA: Diagnosis not present

## 2013-09-13 ENCOUNTER — Ambulatory Visit: Payer: Medicare Other | Admitting: Urology

## 2013-09-27 ENCOUNTER — Ambulatory Visit (INDEPENDENT_AMBULATORY_CARE_PROVIDER_SITE_OTHER): Payer: Medicare Other | Admitting: Urology

## 2013-09-27 DIAGNOSIS — Z125 Encounter for screening for malignant neoplasm of prostate: Secondary | ICD-10-CM

## 2013-09-27 DIAGNOSIS — N400 Enlarged prostate without lower urinary tract symptoms: Secondary | ICD-10-CM | POA: Diagnosis not present

## 2013-09-27 DIAGNOSIS — N529 Male erectile dysfunction, unspecified: Secondary | ICD-10-CM | POA: Diagnosis not present

## 2013-10-04 DIAGNOSIS — G4700 Insomnia, unspecified: Secondary | ICD-10-CM | POA: Diagnosis not present

## 2013-10-04 DIAGNOSIS — D510 Vitamin B12 deficiency anemia due to intrinsic factor deficiency: Secondary | ICD-10-CM | POA: Diagnosis not present

## 2013-10-04 DIAGNOSIS — IMO0002 Reserved for concepts with insufficient information to code with codable children: Secondary | ICD-10-CM | POA: Diagnosis not present

## 2013-10-04 DIAGNOSIS — G8929 Other chronic pain: Secondary | ICD-10-CM | POA: Diagnosis not present

## 2013-12-23 DIAGNOSIS — J069 Acute upper respiratory infection, unspecified: Secondary | ICD-10-CM | POA: Diagnosis not present

## 2013-12-23 DIAGNOSIS — IMO0002 Reserved for concepts with insufficient information to code with codable children: Secondary | ICD-10-CM | POA: Diagnosis not present

## 2013-12-23 DIAGNOSIS — J309 Allergic rhinitis, unspecified: Secondary | ICD-10-CM | POA: Diagnosis not present

## 2013-12-27 DIAGNOSIS — H2510 Age-related nuclear cataract, unspecified eye: Secondary | ICD-10-CM | POA: Diagnosis not present

## 2013-12-27 DIAGNOSIS — H01009 Unspecified blepharitis unspecified eye, unspecified eyelid: Secondary | ICD-10-CM | POA: Diagnosis not present

## 2013-12-27 DIAGNOSIS — H31009 Unspecified chorioretinal scars, unspecified eye: Secondary | ICD-10-CM | POA: Diagnosis not present

## 2013-12-27 DIAGNOSIS — H52209 Unspecified astigmatism, unspecified eye: Secondary | ICD-10-CM | POA: Diagnosis not present

## 2013-12-31 DIAGNOSIS — IMO0002 Reserved for concepts with insufficient information to code with codable children: Secondary | ICD-10-CM | POA: Diagnosis not present

## 2013-12-31 DIAGNOSIS — R079 Chest pain, unspecified: Secondary | ICD-10-CM | POA: Diagnosis not present

## 2014-01-11 DIAGNOSIS — H2510 Age-related nuclear cataract, unspecified eye: Secondary | ICD-10-CM | POA: Diagnosis not present

## 2014-01-11 DIAGNOSIS — H2589 Other age-related cataract: Secondary | ICD-10-CM | POA: Diagnosis not present

## 2014-02-08 DIAGNOSIS — H2589 Other age-related cataract: Secondary | ICD-10-CM | POA: Diagnosis not present

## 2014-02-08 DIAGNOSIS — H2510 Age-related nuclear cataract, unspecified eye: Secondary | ICD-10-CM | POA: Diagnosis not present

## 2014-02-16 DIAGNOSIS — L570 Actinic keratosis: Secondary | ICD-10-CM | POA: Diagnosis not present

## 2014-02-16 DIAGNOSIS — C44519 Basal cell carcinoma of skin of other part of trunk: Secondary | ICD-10-CM | POA: Diagnosis not present

## 2014-02-16 DIAGNOSIS — D485 Neoplasm of uncertain behavior of skin: Secondary | ICD-10-CM | POA: Diagnosis not present

## 2014-02-16 DIAGNOSIS — L821 Other seborrheic keratosis: Secondary | ICD-10-CM | POA: Diagnosis not present

## 2014-03-27 ENCOUNTER — Encounter (HOSPITAL_COMMUNITY): Payer: Self-pay | Admitting: Emergency Medicine

## 2014-03-27 ENCOUNTER — Emergency Department (HOSPITAL_COMMUNITY): Payer: Medicare Other

## 2014-03-27 ENCOUNTER — Encounter (HOSPITAL_COMMUNITY): Payer: Medicare Other | Admitting: Anesthesiology

## 2014-03-27 ENCOUNTER — Inpatient Hospital Stay (HOSPITAL_COMMUNITY): Payer: Medicare Other

## 2014-03-27 ENCOUNTER — Inpatient Hospital Stay (HOSPITAL_COMMUNITY)
Admission: EM | Admit: 2014-03-27 | Discharge: 2014-03-30 | DRG: 481 | Disposition: A | Discharge status: Skilled Nursing Facility | Payer: Medicare Other | Attending: Internal Medicine | Admitting: Internal Medicine

## 2014-03-27 ENCOUNTER — Encounter (HOSPITAL_COMMUNITY)
Admission: EM | Disposition: A | Discharge status: Skilled Nursing Facility | Payer: Self-pay | Source: Home / Self Care | Attending: Internal Medicine

## 2014-03-27 ENCOUNTER — Inpatient Hospital Stay: Admit: 2014-03-27 | Payer: Self-pay | Admitting: Orthopedic Surgery

## 2014-03-27 ENCOUNTER — Inpatient Hospital Stay (HOSPITAL_COMMUNITY): Payer: Medicare Other | Admitting: Anesthesiology

## 2014-03-27 DIAGNOSIS — K219 Gastro-esophageal reflux disease without esophagitis: Secondary | ICD-10-CM

## 2014-03-27 DIAGNOSIS — I10 Essential (primary) hypertension: Secondary | ICD-10-CM | POA: Diagnosis not present

## 2014-03-27 DIAGNOSIS — M259 Joint disorder, unspecified: Secondary | ICD-10-CM | POA: Diagnosis not present

## 2014-03-27 DIAGNOSIS — S7290XA Unspecified fracture of unspecified femur, initial encounter for closed fracture: Secondary | ICD-10-CM | POA: Diagnosis not present

## 2014-03-27 DIAGNOSIS — M6281 Muscle weakness (generalized): Secondary | ICD-10-CM | POA: Diagnosis not present

## 2014-03-27 DIAGNOSIS — J449 Chronic obstructive pulmonary disease, unspecified: Secondary | ICD-10-CM | POA: Diagnosis not present

## 2014-03-27 DIAGNOSIS — K5900 Constipation, unspecified: Secondary | ICD-10-CM | POA: Diagnosis not present

## 2014-03-27 DIAGNOSIS — Z803 Family history of malignant neoplasm of breast: Secondary | ICD-10-CM | POA: Diagnosis not present

## 2014-03-27 DIAGNOSIS — K900 Celiac disease: Secondary | ICD-10-CM | POA: Diagnosis present

## 2014-03-27 DIAGNOSIS — K589 Irritable bowel syndrome without diarrhea: Secondary | ICD-10-CM | POA: Diagnosis present

## 2014-03-27 DIAGNOSIS — Z800 Family history of malignant neoplasm of digestive organs: Secondary | ICD-10-CM

## 2014-03-27 DIAGNOSIS — K279 Peptic ulcer, site unspecified, unspecified as acute or chronic, without hemorrhage or perforation: Secondary | ICD-10-CM | POA: Diagnosis not present

## 2014-03-27 DIAGNOSIS — Z87891 Personal history of nicotine dependence: Secondary | ICD-10-CM

## 2014-03-27 DIAGNOSIS — R269 Unspecified abnormalities of gait and mobility: Secondary | ICD-10-CM | POA: Diagnosis not present

## 2014-03-27 DIAGNOSIS — N400 Enlarged prostate without lower urinary tract symptoms: Secondary | ICD-10-CM | POA: Diagnosis present

## 2014-03-27 DIAGNOSIS — S79929A Unspecified injury of unspecified thigh, initial encounter: Secondary | ICD-10-CM | POA: Diagnosis not present

## 2014-03-27 DIAGNOSIS — M25669 Stiffness of unspecified knee, not elsewhere classified: Secondary | ICD-10-CM | POA: Diagnosis not present

## 2014-03-27 DIAGNOSIS — Z79899 Other long term (current) drug therapy: Secondary | ICD-10-CM | POA: Diagnosis not present

## 2014-03-27 DIAGNOSIS — S79919A Unspecified injury of unspecified hip, initial encounter: Secondary | ICD-10-CM | POA: Diagnosis not present

## 2014-03-27 DIAGNOSIS — G8918 Other acute postprocedural pain: Secondary | ICD-10-CM | POA: Diagnosis not present

## 2014-03-27 DIAGNOSIS — Y92009 Unspecified place in unspecified non-institutional (private) residence as the place of occurrence of the external cause: Secondary | ICD-10-CM

## 2014-03-27 DIAGNOSIS — N189 Chronic kidney disease, unspecified: Secondary | ICD-10-CM | POA: Diagnosis present

## 2014-03-27 DIAGNOSIS — Z0389 Encounter for observation for other suspected diseases and conditions ruled out: Secondary | ICD-10-CM | POA: Diagnosis not present

## 2014-03-27 DIAGNOSIS — S72143A Displaced intertrochanteric fracture of unspecified femur, initial encounter for closed fracture: Principal | ICD-10-CM | POA: Diagnosis present

## 2014-03-27 DIAGNOSIS — I129 Hypertensive chronic kidney disease with stage 1 through stage 4 chronic kidney disease, or unspecified chronic kidney disease: Secondary | ICD-10-CM | POA: Diagnosis not present

## 2014-03-27 DIAGNOSIS — Z5189 Encounter for other specified aftercare: Secondary | ICD-10-CM | POA: Diagnosis not present

## 2014-03-27 DIAGNOSIS — IMO0001 Reserved for inherently not codable concepts without codable children: Secondary | ICD-10-CM | POA: Diagnosis not present

## 2014-03-27 DIAGNOSIS — Z823 Family history of stroke: Secondary | ICD-10-CM

## 2014-03-27 DIAGNOSIS — G2581 Restless legs syndrome: Secondary | ICD-10-CM | POA: Diagnosis not present

## 2014-03-27 DIAGNOSIS — Z9109 Other allergy status, other than to drugs and biological substances: Secondary | ICD-10-CM | POA: Diagnosis not present

## 2014-03-27 DIAGNOSIS — S72009A Fracture of unspecified part of neck of unspecified femur, initial encounter for closed fracture: Secondary | ICD-10-CM | POA: Diagnosis present

## 2014-03-27 DIAGNOSIS — Z808 Family history of malignant neoplasm of other organs or systems: Secondary | ICD-10-CM

## 2014-03-27 DIAGNOSIS — R079 Chest pain, unspecified: Secondary | ICD-10-CM | POA: Diagnosis not present

## 2014-03-27 DIAGNOSIS — S72142A Displaced intertrochanteric fracture of left femur, initial encounter for closed fracture: Secondary | ICD-10-CM | POA: Diagnosis present

## 2014-03-27 DIAGNOSIS — W010XXA Fall on same level from slipping, tripping and stumbling without subsequent striking against object, initial encounter: Secondary | ICD-10-CM | POA: Diagnosis present

## 2014-03-27 DIAGNOSIS — M5380 Other specified dorsopathies, site unspecified: Secondary | ICD-10-CM

## 2014-03-27 DIAGNOSIS — R6889 Other general symptoms and signs: Secondary | ICD-10-CM | POA: Diagnosis not present

## 2014-03-27 DIAGNOSIS — D62 Acute posthemorrhagic anemia: Secondary | ICD-10-CM | POA: Diagnosis not present

## 2014-03-27 DIAGNOSIS — M810 Age-related osteoporosis without current pathological fracture: Secondary | ICD-10-CM | POA: Diagnosis present

## 2014-03-27 DIAGNOSIS — M25559 Pain in unspecified hip: Secondary | ICD-10-CM | POA: Diagnosis not present

## 2014-03-27 DIAGNOSIS — M1990 Unspecified osteoarthritis, unspecified site: Secondary | ICD-10-CM | POA: Diagnosis present

## 2014-03-27 DIAGNOSIS — G4700 Insomnia, unspecified: Secondary | ICD-10-CM | POA: Diagnosis not present

## 2014-03-27 DIAGNOSIS — D649 Anemia, unspecified: Secondary | ICD-10-CM | POA: Diagnosis not present

## 2014-03-27 DIAGNOSIS — IMO0002 Reserved for concepts with insufficient information to code with codable children: Secondary | ICD-10-CM | POA: Diagnosis not present

## 2014-03-27 DIAGNOSIS — Z9181 History of falling: Secondary | ICD-10-CM | POA: Diagnosis not present

## 2014-03-27 DIAGNOSIS — S298XXA Other specified injuries of thorax, initial encounter: Secondary | ICD-10-CM | POA: Diagnosis not present

## 2014-03-27 HISTORY — DX: Essential (primary) hypertension: I10

## 2014-03-27 HISTORY — DX: Acute posthemorrhagic anemia: D62

## 2014-03-27 HISTORY — PX: INTRAMEDULLARY (IM) NAIL INTERTROCHANTERIC: SHX5875

## 2014-03-27 LAB — TYPE AND SCREEN
ABO/RH(D): B POS
ANTIBODY SCREEN: NEGATIVE

## 2014-03-27 LAB — CBC WITH DIFFERENTIAL/PLATELET
BASOS PCT: 0 % (ref 0–1)
Basophils Absolute: 0 10*3/uL (ref 0.0–0.1)
Eosinophils Absolute: 0.5 10*3/uL (ref 0.0–0.7)
Eosinophils Relative: 8 % — ABNORMAL HIGH (ref 0–5)
HEMATOCRIT: 35.1 % — AB (ref 39.0–52.0)
HEMOGLOBIN: 11.8 g/dL — AB (ref 13.0–17.0)
LYMPHS ABS: 1 10*3/uL (ref 0.7–4.0)
LYMPHS PCT: 15 % (ref 12–46)
MCH: 30 pg (ref 26.0–34.0)
MCHC: 33.6 g/dL (ref 30.0–36.0)
MCV: 89.3 fL (ref 78.0–100.0)
MONO ABS: 0.6 10*3/uL (ref 0.1–1.0)
MONOS PCT: 9 % (ref 3–12)
NEUTROS ABS: 4.6 10*3/uL (ref 1.7–7.7)
Neutrophils Relative %: 68 % (ref 43–77)
Platelets: 261 10*3/uL (ref 150–400)
RBC: 3.93 MIL/uL — AB (ref 4.22–5.81)
RDW: 14.2 % (ref 11.5–15.5)
WBC: 6.6 10*3/uL (ref 4.0–10.5)

## 2014-03-27 LAB — BASIC METABOLIC PANEL
BUN: 20 mg/dL (ref 6–23)
CO2: 26 mEq/L (ref 19–32)
Calcium: 8.4 mg/dL (ref 8.4–10.5)
Chloride: 104 mEq/L (ref 96–112)
Creatinine, Ser: 0.83 mg/dL (ref 0.50–1.35)
GFR calc non Af Amer: 83 mL/min — ABNORMAL LOW (ref >90)
Glucose, Bld: 91 mg/dL (ref 70–99)
POTASSIUM: 4.6 meq/L (ref 3.7–5.3)
Sodium: 140 mEq/L (ref 137–147)

## 2014-03-27 LAB — PROTIME-INR
INR: 1 (ref 0.00–1.49)
Prothrombin Time: 13 seconds (ref 11.6–15.2)

## 2014-03-27 LAB — SURGICAL PCR SCREEN
MRSA, PCR: NEGATIVE
Staphylococcus aureus: NEGATIVE

## 2014-03-27 LAB — ABO/RH: ABO/RH(D): B POS

## 2014-03-27 SURGERY — FIXATION, FRACTURE, INTERTROCHANTERIC, WITH INTRAMEDULLARY ROD
Anesthesia: Spinal | Site: Hip | Laterality: Left

## 2014-03-27 MED ORDER — MELOXICAM 15 MG PO TABS
15.0000 mg | ORAL_TABLET | Freq: Every day | ORAL | Status: DC
  Filled 2014-03-27: qty 1

## 2014-03-27 MED ORDER — DOXAZOSIN MESYLATE 2 MG PO TABS
2.0000 mg | ORAL_TABLET | Freq: Every day | ORAL | Status: DC
  Administered 2014-03-28 – 2014-03-29 (×2): 2 mg via ORAL
  Filled 2014-03-27 (×5): qty 1

## 2014-03-27 MED ORDER — PANTOPRAZOLE SODIUM 40 MG PO TBEC
40.0000 mg | DELAYED_RELEASE_TABLET | Freq: Every day | ORAL | Status: DC
  Administered 2014-03-28 – 2014-03-30 (×3): 40 mg via ORAL
  Filled 2014-03-27 (×4): qty 1

## 2014-03-27 MED ORDER — CALCIUM CARBONATE 200 MG PO CAPS: 250.0000 mg | ORAL_CAPSULE | Freq: Two times a day (BID) | ORAL | Status: DC

## 2014-03-27 MED ORDER — LORATADINE 10 MG PO TABS
10.0000 mg | ORAL_TABLET | Freq: Every day | ORAL | Status: DC
  Administered 2014-03-28 – 2014-03-30 (×3): 10 mg via ORAL
  Filled 2014-03-27 (×4): qty 1

## 2014-03-27 MED ORDER — FENTANYL CITRATE 0.05 MG/ML IJ SOLN
50.0000 ug | INTRAMUSCULAR | Status: AC | PRN
  Administered 2014-03-27 (×2): 50 ug via INTRAVENOUS
  Filled 2014-03-27 (×2): qty 2

## 2014-03-27 MED ORDER — OXYCODONE HCL 5 MG PO TABS: 5.0000 mg | ORAL_TABLET | Freq: Once | ORAL | Status: DC | PRN

## 2014-03-27 MED ORDER — LIDOCAINE HCL (CARDIAC) 20 MG/ML IV SOLN
INTRAVENOUS | Status: DC | PRN
  Administered 2014-03-27: 20 mg via INTRAVENOUS

## 2014-03-27 MED ORDER — ASPIRIN EC 325 MG PO TBEC: 325.0000 mg | DELAYED_RELEASE_TABLET | Freq: Two times a day (BID) | ORAL | Status: DC

## 2014-03-27 MED ORDER — PROMETHAZINE HCL 25 MG/ML IJ SOLN: 6.2500 mg | INTRAMUSCULAR | Status: DC | PRN

## 2014-03-27 MED ORDER — HYDROCODONE-ACETAMINOPHEN 5-325 MG PO TABS: 1.0000 | ORAL_TABLET | Freq: Four times a day (QID) | ORAL | Status: DC | PRN

## 2014-03-27 MED ORDER — OXYCODONE HCL 5 MG/5ML PO SOLN
5.0000 mg | Freq: Once | ORAL | Status: AC | PRN
  Filled 2014-03-27: qty 5

## 2014-03-27 MED ORDER — FENTANYL CITRATE 0.05 MG/ML IJ SOLN
INTRAMUSCULAR | Status: DC | PRN
  Administered 2014-03-27: 50 ug via INTRAVENOUS

## 2014-03-27 MED ORDER — ROPIVACAINE HCL 5 MG/ML IJ SOLN
INTRAMUSCULAR | Status: AC
  Filled 2014-03-27: qty 30

## 2014-03-27 MED ORDER — HYDRALAZINE HCL 20 MG/ML IJ SOLN
5.0000 mg | Freq: Four times a day (QID) | INTRAMUSCULAR | Status: DC
  Administered 2014-03-28: 5 mg via INTRAVENOUS
  Filled 2014-03-27 (×7): qty 0.25

## 2014-03-27 MED ORDER — LORAZEPAM 2 MG/ML IJ SOLN: 0.5000 mg | Freq: Two times a day (BID) | INTRAMUSCULAR | Status: DC | PRN

## 2014-03-27 MED ORDER — PROPOFOL 10 MG/ML IV BOLUS
INTRAVENOUS | Status: DC | PRN
  Administered 2014-03-27: 20 mg via INTRAVENOUS
  Administered 2014-03-27: 40 mg via INTRAVENOUS
  Administered 2014-03-27: 30 mg via INTRAVENOUS

## 2014-03-27 MED ORDER — MEPERIDINE HCL 50 MG/ML IJ SOLN: 6.2500 mg | INTRAMUSCULAR | Status: DC | PRN

## 2014-03-27 MED ORDER — ACETAMINOPHEN 10 MG/ML IV SOLN
1000.0000 mg | Freq: Once | INTRAVENOUS | Status: DC
Start: 2014-03-27 — End: 2014-03-28
  Filled 2014-03-27 (×2): qty 100

## 2014-03-27 MED ORDER — MORPHINE SULFATE 2 MG/ML IJ SOLN
0.5000 mg | INTRAMUSCULAR | Status: DC | PRN
  Administered 2014-03-27: 0.5 mg via INTRAVENOUS
  Filled 2014-03-27: qty 1

## 2014-03-27 MED ORDER — PROPOFOL 10 MG/ML IV BOLUS
INTRAVENOUS | Status: AC
  Filled 2014-03-27: qty 20

## 2014-03-27 MED ORDER — ONDANSETRON HCL 4 MG/2ML IJ SOLN
4.0000 mg | Freq: Once | INTRAMUSCULAR | Status: AC
  Administered 2014-03-27: 4 mg via INTRAVENOUS
  Filled 2014-03-27: qty 2

## 2014-03-27 MED ORDER — LORAZEPAM 2 MG/ML IJ SOLN
0.5000 mg | Freq: Two times a day (BID) | INTRAMUSCULAR | Status: DC | PRN
  Administered 2014-03-27: 0.5 mg via INTRAVENOUS
  Filled 2014-03-27: qty 1

## 2014-03-27 MED ORDER — ADULT MULTIVITAMIN W/MINERALS CH
1.0000 | ORAL_TABLET | Freq: Every day | ORAL | Status: DC
  Administered 2014-03-28 – 2014-03-30 (×3): 1 via ORAL
  Filled 2014-03-27 (×5): qty 1

## 2014-03-27 MED ORDER — MEPERIDINE HCL 25 MG/ML IJ SOLN: 6.2500 mg | INTRAMUSCULAR | Status: DC | PRN

## 2014-03-27 MED ORDER — HYDROMORPHONE HCL PF 1 MG/ML IJ SOLN
0.2500 mg | INTRAMUSCULAR | Status: DC | PRN
Start: 2014-03-27 — End: 2014-03-28

## 2014-03-27 MED ORDER — METHOCARBAMOL 1000 MG/10ML IJ SOLN
500.0000 mg | Freq: Four times a day (QID) | INTRAVENOUS | Status: DC | PRN
  Administered 2014-03-27: 500 mg via INTRAVENOUS
  Filled 2014-03-27: qty 5

## 2014-03-27 MED ORDER — FENTANYL CITRATE 0.05 MG/ML IJ SOLN
50.0000 ug | INTRAMUSCULAR | Status: AC | PRN
  Administered 2014-03-27 (×2): 50 ug via INTRAVENOUS
  Filled 2014-03-27: qty 2

## 2014-03-27 MED ORDER — MONTELUKAST SODIUM 10 MG PO TABS
10.0000 mg | ORAL_TABLET | Freq: Every day | ORAL | Status: DC
  Administered 2014-03-28 – 2014-03-29 (×2): 10 mg via ORAL
  Filled 2014-03-27 (×5): qty 1

## 2014-03-27 MED ORDER — ACETAMINOPHEN 10 MG/ML IV SOLN
INTRAVENOUS | Status: DC | PRN
  Administered 2014-03-27: 1000 mg via INTRAVENOUS

## 2014-03-27 MED ORDER — SODIUM CHLORIDE 0.9 % IV SOLN
INTRAVENOUS | Status: AC
  Administered 2014-03-27: 17:00:00 via INTRAVENOUS

## 2014-03-27 MED ORDER — PROPOFOL INFUSION 10 MG/ML OPTIME
INTRAVENOUS | Status: DC | PRN
  Administered 2014-03-27: 25 ug/kg/min via INTRAVENOUS

## 2014-03-27 MED ORDER — OXYCODONE HCL 5 MG PO TABS: 5.0000 mg | ORAL_TABLET | ORAL | Status: DC | PRN

## 2014-03-27 MED ORDER — PHENYLEPHRINE HCL 10 MG/ML IJ SOLN
INTRAMUSCULAR | Status: AC
  Filled 2014-03-27: qty 1

## 2014-03-27 MED ORDER — EPHEDRINE SULFATE 50 MG/ML IJ SOLN
INTRAMUSCULAR | Status: AC
  Filled 2014-03-27: qty 1

## 2014-03-27 MED ORDER — LACTATED RINGERS IV SOLN: Freq: Once | INTRAVENOUS | Status: DC

## 2014-03-27 MED ORDER — OXYCODONE HCL 5 MG PO TABS: 5.0000 mg | ORAL_TABLET | Freq: Once | ORAL | Status: AC | PRN

## 2014-03-27 MED ORDER — HYDROMORPHONE HCL PF 1 MG/ML IJ SOLN: 0.2500 mg | INTRAMUSCULAR | Status: DC | PRN

## 2014-03-27 MED ORDER — DEXAMETHASONE SODIUM PHOSPHATE 10 MG/ML IJ SOLN
INTRAMUSCULAR | Status: DC | PRN
  Administered 2014-03-27: 10 mg via INTRAVENOUS

## 2014-03-27 MED ORDER — PREGABALIN 50 MG PO CAPS
50.0000 mg | ORAL_CAPSULE | Freq: Every day | ORAL | Status: DC
  Administered 2014-03-28 – 2014-03-30 (×3): 50 mg via ORAL
  Filled 2014-03-27 (×3): qty 1

## 2014-03-27 MED ORDER — SODIUM CHLORIDE 0.9 % IV SOLN: INTRAVENOUS | Status: DC

## 2014-03-27 MED ORDER — 0.9 % SODIUM CHLORIDE (POUR BTL) OPTIME
TOPICAL | Status: DC | PRN
Start: 2014-03-27 — End: 2014-03-27
  Administered 2014-03-27: 1000 mL

## 2014-03-27 MED ORDER — CEFAZOLIN SODIUM-DEXTROSE 2-3 GM-% IV SOLR
INTRAVENOUS | Status: AC
  Filled 2014-03-27: qty 50

## 2014-03-27 MED ORDER — LACTATED RINGERS IV SOLN
INTRAVENOUS | Status: DC | PRN
  Administered 2014-03-27 (×2): via INTRAVENOUS

## 2014-03-27 MED ORDER — BUPIVACAINE HCL (PF) 0.5 % IJ SOLN
INTRAMUSCULAR | Status: DC | PRN
  Administered 2014-03-27: 3 mL

## 2014-03-27 MED ORDER — OXYCODONE HCL 5 MG/5ML PO SOLN: 5.0000 mg | Freq: Once | ORAL | Status: DC | PRN

## 2014-03-27 MED ORDER — ROPIVACAINE HCL 5 MG/ML IJ SOLN
INTRAMUSCULAR | Status: DC | PRN
  Administered 2014-03-27: 30 mL via PERINEURAL

## 2014-03-27 MED ORDER — VITAMIN D3 25 MCG (1000 UNIT) PO TABS
1000.0000 [IU] | ORAL_TABLET | Freq: Every day | ORAL | Status: DC
  Administered 2014-03-28 – 2014-03-30 (×3): 1000 [IU] via ORAL
  Filled 2014-03-27 (×5): qty 1

## 2014-03-27 MED ORDER — ONDANSETRON HCL 4 MG/2ML IJ SOLN
INTRAMUSCULAR | Status: DC | PRN
  Administered 2014-03-27: 4 mg via INTRAVENOUS

## 2014-03-27 MED ORDER — ROPINIROLE HCL 1 MG PO TABS
1.0000 mg | ORAL_TABLET | Freq: Every day | ORAL | Status: DC
  Administered 2014-03-28 – 2014-03-29 (×2): 1 mg via ORAL
  Filled 2014-03-27 (×5): qty 1

## 2014-03-27 MED ORDER — SODIUM CHLORIDE 0.9 % IJ SOLN
INTRAMUSCULAR | Status: AC
  Filled 2014-03-27: qty 10

## 2014-03-27 MED ORDER — CEFAZOLIN SODIUM-DEXTROSE 2-3 GM-% IV SOLR
INTRAVENOUS | Status: DC | PRN
  Administered 2014-03-27: 2 g via INTRAVENOUS

## 2014-03-27 MED ORDER — TIZANIDINE HCL 2 MG PO TABS
2.0000 mg | ORAL_TABLET | Freq: Every evening | ORAL | Status: DC | PRN
  Filled 2014-03-27: qty 2

## 2014-03-27 MED ORDER — LIDOCAINE HCL (CARDIAC) 20 MG/ML IV SOLN
INTRAVENOUS | Status: AC
  Filled 2014-03-27: qty 5

## 2014-03-27 MED ORDER — ZOLPIDEM TARTRATE 5 MG PO TABS
5.0000 mg | ORAL_TABLET | Freq: Every evening | ORAL | Status: DC | PRN
  Administered 2014-03-29 (×2): 5 mg via ORAL
  Filled 2014-03-27 (×2): qty 1

## 2014-03-27 MED ORDER — ATROPINE SULFATE 0.4 MG/ML IJ SOLN
INTRAMUSCULAR | Status: AC
  Filled 2014-03-27: qty 1

## 2014-03-27 MED ORDER — MORPHINE SULFATE 2 MG/ML IJ SOLN
1.0000 mg | INTRAMUSCULAR | Status: DC | PRN
  Administered 2014-03-27 – 2014-03-28 (×3): 1 mg via INTRAVENOUS
  Filled 2014-03-27 (×3): qty 1

## 2014-03-27 MED ORDER — ROCURONIUM BROMIDE 100 MG/10ML IV SOLN
INTRAVENOUS | Status: AC
  Filled 2014-03-27: qty 1

## 2014-03-27 MED ORDER — FENTANYL CITRATE 0.05 MG/ML IJ SOLN
INTRAMUSCULAR | Status: AC
  Filled 2014-03-27: qty 5

## 2014-03-27 MED ORDER — CALCIUM CARBONATE ANTACID 500 MG PO CHEW
1.0000 | CHEWABLE_TABLET | Freq: Two times a day (BID) | ORAL | Status: DC
  Administered 2014-03-29 – 2014-03-30 (×2): 200 mg via ORAL
  Filled 2014-03-27 (×8): qty 1

## 2014-03-27 MED ORDER — MORPHINE SULFATE 2 MG/ML IJ SOLN: 1.0000 mg | INTRAMUSCULAR | Status: DC | PRN

## 2014-03-27 SURGICAL SUPPLY — 39 items
BAG ZIPLOCK 12X15 (MISCELLANEOUS) IMPLANT
BIT DRILL CANN LG 4.3MM (BIT) ×1 IMPLANT
BNDG GAUZE ELAST 4 BULKY (GAUZE/BANDAGES/DRESSINGS) ×2 IMPLANT
DERMABOND ADVANCED (GAUZE/BANDAGES/DRESSINGS) ×1
DERMABOND ADVANCED .7 DNX12 (GAUZE/BANDAGES/DRESSINGS) ×1 IMPLANT
DRAPE INCISE IOBAN 66X45 STRL (DRAPES) ×2 IMPLANT
DRAPE STERI IOBAN 125X83 (DRAPES) ×2 IMPLANT
DRILL BIT CANN LG 4.3MM (BIT) ×2
DRSG AQUACEL AG ADV 3.5X 4 (GAUZE/BANDAGES/DRESSINGS) ×2 IMPLANT
DRSG AQUACEL AG ADV 3.5X 6 (GAUZE/BANDAGES/DRESSINGS) ×2 IMPLANT
DRSG PAD ABDOMINAL 8X10 ST (GAUZE/BANDAGES/DRESSINGS) IMPLANT
DURAPREP 26ML APPLICATOR (WOUND CARE) ×2 IMPLANT
ELECT REM PT RETURN 9FT ADLT (ELECTROSURGICAL) ×2
ELECTRODE REM PT RTRN 9FT ADLT (ELECTROSURGICAL) ×1 IMPLANT
GAUZE SPONGE 4X4 12PLY STRL (GAUZE/BANDAGES/DRESSINGS) IMPLANT
GLOVE BIOGEL PI IND STRL 7.5 (GLOVE) ×1 IMPLANT
GLOVE BIOGEL PI IND STRL 8 (GLOVE) IMPLANT
GLOVE BIOGEL PI INDICATOR 7.5 (GLOVE) ×1
GLOVE BIOGEL PI INDICATOR 8 (GLOVE)
GLOVE ECLIPSE 8.0 STRL XLNG CF (GLOVE) IMPLANT
GLOVE ORTHO TXT STRL SZ7.5 (GLOVE) ×4 IMPLANT
GLOVE SURG ORTHO 8.0 STRL STRW (GLOVE) IMPLANT
GOWN SPEC L3 XXLG W/TWL (GOWN DISPOSABLE) ×4 IMPLANT
GOWN STRL REUS W/TWL LRG LVL3 (GOWN DISPOSABLE) ×2 IMPLANT
GUIDEPIN 3.2X17.5 THRD DISP (PIN) ×2 IMPLANT
HIP FRAC NAIL LAG SCR 10.5X100 (Orthopedic Implant) ×1 IMPLANT
KIT BASIN OR (CUSTOM PROCEDURE TRAY) ×2 IMPLANT
MANIFOLD NEPTUNE II (INSTRUMENTS) ×2 IMPLANT
NAIL HIP FRACT 130D 11X180 (Screw) ×2 IMPLANT
PACK GENERAL/GYN (CUSTOM PROCEDURE TRAY) ×2 IMPLANT
POSITIONER SURGICAL ARM (MISCELLANEOUS) ×2 IMPLANT
SCREW BONE CORTICAL 5.0X36 (Screw) ×2 IMPLANT
SCREW CANN THRD AFF 10.5X100 (Orthopedic Implant) ×1 IMPLANT
STAPLER VISISTAT 35W (STAPLE) ×2 IMPLANT
SUT VIC AB 1 CT1 27 (SUTURE) ×1
SUT VIC AB 1 CT1 27XBRD ANTBC (SUTURE) ×1 IMPLANT
SUT VIC AB 2-0 CT1 27 (SUTURE) ×2
SUT VIC AB 2-0 CT1 27XBRD (SUTURE) ×2 IMPLANT
TOWEL OR 17X26 10 PK STRL BLUE (TOWEL DISPOSABLE) ×4 IMPLANT

## 2014-03-27 NOTE — Brief Op Note (Signed)
03/27/2014  10:26 PM  PATIENT:  [name]  79 y.o. male  PRE-OPERATIVE DIAGNOSIS:  LEFT Intertrochanteric hip fracture  POST-OPERATIVE DIAGNOSIS:  LEFT Intertrochanteric hip fracture  PROCEDURE:  Procedure(s): INTRAMEDULLARY (IM) NAIL INTERTROCHANTRIC (Left), ORIF  SURGEON:  Surgeon(s) and Role:    * Mauri Pole, MD - Primary  PHYSICIAN ASSISTANT: None  ANESTHESIA:   regional and spinal  EBL:  Total I/O In: 225 [I.V.:225] Out: 525 [Urine:525]  BLOOD ADMINISTERED:none  DRAINS: none   LOCAL MEDICATIONS USED:  NONE  SPECIMEN:  No Specimen  DISPOSITION OF SPECIMEN:  N/A  COUNTS:  YES  TOURNIQUET:  * No tourniquets in log *  DICTATION: .Other Dictation: Dictation Number [id number]  PLAN OF CARE: Admit to inpatient   PATIENT DISPOSITION:  PACU - hemodynamically stable.   Delay start of Pharmacological VTE agent (>24hrs) due to surgical blood loss or risk of bleeding: no

## 2014-03-27 NOTE — H&P (Signed)
[facility] History and Physical  [name] WUJ:[id number] DOB: 01/24/1935 DOA: 03/27/2014  Referring physician: ER physician PCP: Purvis Kilts, MD   Chief Complaint: fall  HPI:  79 year old male with past medical history of IBS, GERD, osteoarthritis, enlarged prostate who presented to [facility] ED 03/27/2014 status post fall at home. Pt reported not having prodromal symptoms of chest pain, shortness of breath or palpitations prior to the fall. No reports of lightheadedness or dizziness prior to the fall. Pt reported pain 10/10 in intensity somewhat getting better with analgesia given in ED. No GI or GU complaints. In ED, BP was 111/79, HR was 63, T max 98.3 F and oxygen saturation 93% on room air. His x ray of the left hip reveal left intertrochanteric fracture. His 12 lead lead EKG showed sinus rhythm. CXR did not show acute cardiopulmonary findings.  Assessment & Plan    Active Problems:   Fracture, intertrochanteric, left femur - status post fall - management per orthopedic surgery - medical optimized for surgery; his 12 lead EKG showed sinus rhythm and no acute findings on CXR. - continue supportive care with IV fluids, analgesia as needed and surgery likely today.   Hypertension  - may use hydralazine 5 mg IV every 6 hours; initial BP good but then acutely elevated 178/65   BPH - may continue cardura   DVT prophylaxis:   Radiological Exams on Admission: Dg Chest 1 View 03/27/2014   IMPRESSION: No edema or consolidation.   Electronically Signed   By: Lowella Grip M.D.   On: 03/27/2014 13:07   Dg Hip Complete Left 03/27/2014    IMPRESSION: There is an acute nondisplaced fracture of the intertrochanteric region of the left hip.   Electronically Signed   By: David  Martinique   On: 03/27/2014 13:09    EKG: sinus rhythm  Code Status: Full Family Communication: Plan of care discussed with the patient  Disposition Plan: Admit for further evaluation  Leisa Lenz,  MD  [facility] Pager [phone]  Review of Systems:  Constitutional: Negative for fever, chills and malaise/fatigue. Negative for diaphoresis.  HENT: Negative for hearing loss, ear pain, nosebleeds, congestion, sore throat, neck pain, tinnitus and ear discharge.   Eyes: Negative for blurred vision, double vision, photophobia, pain, discharge and redness.  Respiratory: Negative for cough, hemoptysis, sputum production, shortness of breath, wheezing and stridor.   Cardiovascular: Negative for chest pain, palpitations, orthopnea, claudication and leg swelling.  Gastrointestinal: Negative for nausea, vomiting and abdominal pain. Negative for heartburn, constipation, blood in stool and melena.  Genitourinary: Negative for dysuria, urgency, frequency, hematuria and flank pain.  Musculoskeletal: per HPI Skin: Negative for itching and rash.  Neurological: Negative for dizziness and weakness. Negative for tingling, tremors, sensory change, speech change, focal weakness, loss of consciousness and headaches.  Endo/Heme/Allergies: Negative for environmental allergies and polydipsia. Does not bruise/bleed easily.  Psychiatric/Behavioral: Negative for suicidal ideas. The patient is not nervous/anxious.      Past Medical History  Diagnosis Date  . IBS (irritable bowel syndrome)   . Full dentures   . Ulcer   . Hemorrhoids   . GERD (gastroesophageal reflux disease)   . Osteoporosis   . Hiatal hernia   . Chronic kidney disease   . Arthritis     osteoarthritis  back  . CD (celiac disease)     followed by dr Earlean Shawl  . Enlarged prostate     followed by dr  Dorina Hoyer   Past Surgical History  Procedure Laterality Date  . Appendectomy  1963  . Hernia repair  2002    right inguinal   . Inguinal hernia repair  08/19/2011    Procedure: HERNIA REPAIR  left INGUINAL ADULT;  Surgeon: Odis Hollingshead, MD;  Location: [facility];  Service: General;  Laterality: Left;  Marland Kitchen Eye surgery Bilateral     cataract  removal - implants   Social History:  reports that he quit smoking about 32 years ago. He has never used smokeless tobacco. He reports that he drinks alcohol. He reports that he does not use illicit drugs.  No Known Allergies  Family History:  Family History  Problem Relation Age of Onset  . Cancer Mother     cervical  . Stroke Father   . Cancer Sister     breast  . Cancer Brother     throat  . Cancer Brother     liver  . Cancer Brother     brain     Prior to Admission medications   Medication Sig Start Date End Date Taking? Authorizing Provider  calcium carbonate 200 MG capsule Take 250 mg by mouth 2 (two) times daily with a meal.     Yes Historical Provider, MD  cholecalciferol (VITAMIN D) 1000 UNITS tablet Take 1,000 Units by mouth daily.     Yes Historical Provider, MD  doxazosin (CARDURA) 4 MG tablet Take 2 mg by mouth at bedtime.  07/21/11  Yes Historical Provider, MD  fexofenadine (ALLEGRA) 180 MG tablet Take 180 mg by mouth daily.     Yes Historical Provider, MD  HYDROcodone-acetaminophen (NORCO/VICODIN) 5-325 MG per tablet Take 1 tablet by mouth every 6 (six) hours as needed for moderate pain.   Yes Historical Provider, MD  LYRICA 50 MG capsule Take 50 mg by mouth daily.  05/19/11  Yes Historical Provider, MD  meloxicam (MOBIC) 15 MG tablet Take 15 mg by mouth daily.     Yes Historical Provider, MD  montelukast (SINGULAIR) 10 MG tablet Take 10 mg by mouth at bedtime.     Yes Historical Provider, MD  Multiple Vitamin (MULTIVITAMIN) tablet Take 1 tablet by mouth daily.    Yes Historical Provider, MD  omeprazole (PRILOSEC) 40 MG capsule Take 40 mg by mouth 2 (two) times daily.  06/24/11  Yes Historical Provider, MD  rOPINIRole (REQUIP) 1 MG tablet Take 1 mg by mouth at bedtime.  06/24/11  Yes Historical Provider, MD  tiZANidine (ZANAFLEX) 4 MG tablet Take 2-4 mg by mouth at bedtime as needed. For pain; 1 -2 tablets depending on pain level 06/24/11  Yes Historical Provider, MD   zolpidem (AMBIEN) 5 MG tablet Take 5 mg by mouth at bedtime as needed. For sleep   Yes Historical Provider, MD   Physical Exam: Filed Vitals:   03/27/14 1446 03/27/14 1500 03/27/14 1540 03/27/14 1600  BP: 138/65 111/79 159/68 135/76  Pulse: 65 63 64 63  Temp: 98.3 F (36.8 C)     TempSrc: Oral     Resp: 12 13 15 17   SpO2: 93% 96% 98% 95%    Physical Exam  Constitutional: Appears well-developed and well-nourished. No distress.  HENT: Normocephalic. No tonsillar erythema or exudates Eyes: Conjunctivae and EOM are normal. PERRLA, no scleral icterus.  Neck: Normal ROM. Neck supple. No JVD. No tracheal deviation. No thyromegaly.  CVS: RRR, S1/S2 appreciated   Pulmonary: Effort and breath sounds normal, no stridor, rhonchi, wheezes, rales.  Abdominal: Soft. BS +,  no distension, tenderness,  rebound or guarding.  Musculoskeletal: LLE pain, limited range of motion, (+) tenderness.  Lymphadenopathy: No lymphadenopathy noted, cervical, inguinal. Neuro: Alert. Normal reflexes, muscle tone coordination. No focal neurologic deficits. Skin: Skin is warm and dry. No rash noted. Not diaphoretic. No erythema. No pallor.  Psychiatric: Normal mood and affect. Behavior, judgment, thought content normal.   Labs on Admission:  Basic Metabolic Panel:  Recent Labs Lab 03/27/14 1305  NA 140  K 4.6  CL 104  CO2 26  GLUCOSE 91  BUN 20  CREATININE 0.83  CALCIUM 8.4   Liver Function Tests: No results found for this basename: AST, ALT, ALKPHOS, BILITOT, PROT, ALBUMIN,  in the last 168 hours No results found for this basename: LIPASE, AMYLASE,  in the last 168 hours No results found for this basename: AMMONIA,  in the last 168 hours CBC:  Recent Labs Lab 03/27/14 1238  WBC 6.6  NEUTROABS 4.6  HGB 11.8*  HCT 35.1*  MCV 89.3  PLT 261   Cardiac Enzymes: No results found for this basename: CKTOTAL, CKMB, CKMBINDEX, TROPONINI,  in the last 168 hours BNP: No components found with this  basename: POCBNP,  CBG: No results found for this basename: GLUCAP,  in the last 168 hours  If 7PM-7AM, please contact night-coverage [email or web address] Password [facility] 03/27/2014, 4:14 PM

## 2014-03-27 NOTE — ED Notes (Signed)
Patient transported to X-ray 

## 2014-03-27 NOTE — Progress Notes (Signed)
  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 with hip pain after a fall dx Fracture, intertrochanteric, left femur Family, son at the bedside     Subjective/Objective Assessment Detail:   pcp Sharilyn Sites  Pt confirms not having to use a previous home health agency but his wife did  Agree to review [place] and [place] home health lists to obtain his choice if needed prior to d/c     Action/Plan:   ED CM noted EPIC Cm consult, CM spoke with pt, son & 2 male family members Cm reviewed home health lists for [place] and [place]   Action/Plan Detail:   Anticipated DC Date:  03/30/2014     Status Recommendation to Physician:   Result of Recommendation:    Other ED Services  Consult Working [facility]  Other  Outpatient Services - Pt will follow up    Choice offered to / List presented to:            Status of service:  Completed, signed off  ED Comments:   ED Comments Detail:

## 2014-03-27 NOTE — Transfer of Care (Signed)
Immediate Anesthesia Transfer of Care Note  Patient: [name]  Procedure(s) Performed: Procedure(s) (LRB): INTRAMEDULLARY (IM) NAIL INTERTROCHANTRIC (Left)  Patient Location: PACU  Anesthesia Type: Spinal  Level of Consciousness: sedated, patient cooperative and responds to stimulation  Airway & Oxygen Therapy: Patient Spontanous Breathing and Patient connected to face mask oxgen  Post-op Assessment: Report given to PACU RN and Post -op Vital signs reviewed and stable  Post vital signs: Reviewed and stable  Complications: No apparent anesthesia complications

## 2014-03-27 NOTE — ED Notes (Signed)
Pt to xray

## 2014-03-27 NOTE — Consult Note (Signed)
Reason for Consult: Left hip IT fracture Referring Physician:  ED, phycisian  [name] is an 79 y.o. male.  HPI:  79 yo male unfortunately had fall today in his yard when he stumbled over his chihuahua.  Immediate pain, inability to move particularly weight bear.  Brought to [facility] for work up.  X-ray revealed left hip fracture.  No other complaints.  No medical concerns lately  Past Medical History  Diagnosis Date  . IBS (irritable bowel syndrome)   . Full dentures   . Ulcer   . Hemorrhoids   . GERD (gastroesophageal reflux disease)   . Osteoporosis   . Hiatal hernia   . Chronic kidney disease   . Arthritis     osteoarthritis  back  . CD (celiac disease)     followed by dr Earlean Shawl  . Enlarged prostate     followed by dr  Dorina Hoyer    Past Surgical History  Procedure Laterality Date  . Appendectomy  1963  . Hernia repair  2002    right inguinal   . Inguinal hernia repair  08/19/2011    Procedure: HERNIA REPAIR  left INGUINAL ADULT;  Surgeon: Odis Hollingshead, MD;  Location: [facility];  Service: General;  Laterality: Left;  Marland Kitchen Eye surgery Bilateral     cataract removal - implants    Family History  Problem Relation Age of Onset  . Cancer Mother     cervical  . Stroke Father   . Cancer Sister     breast  . Cancer Brother     throat  . Cancer Brother     liver  . Cancer Brother     brain    Social History:  reports that he quit smoking about 32 years ago. He has never used smokeless tobacco. He reports that he drinks alcohol. He reports that he does not use illicit drugs.  Allergies: No Known Allergies  Medications:  I have reviewed the patient's current medications. Scheduled: . acetaminophen  1,000 mg Intravenous Once  . [facility] HOLD] calcium carbonate  1 tablet Oral BID WC  . [facility] HOLD] cholecalciferol  1,000 Units Oral Daily  . [MAR HOLD] doxazosin  2 mg Oral QHS  . [MAR HOLD] hydrALAZINE  5 mg Intravenous Q6H  . [facility] HOLD] loratadine  10 mg Oral Daily  .  [facility] HOLD] meloxicam  15 mg Oral Daily  . [MAR HOLD] montelukast  10 mg Oral QHS  . [MAR HOLD] multivitamin with minerals  1 tablet Oral Daily  . [MAR HOLD] pantoprazole  40 mg Oral Daily  . [facility] HOLD] pregabalin  50 mg Oral Daily  . [facility] HOLD] rOPINIRole  1 mg Oral QHS    Results for orders placed during the hospital encounter of 03/27/14 (from the past 24 hour(s))  CBC WITH DIFFERENTIAL     Status: Abnormal   Collection Time    03/27/14 12:38 PM      Result Value Ref Range   WBC 6.6  4.0 - 10.5 K/uL   RBC 3.93 (*) 4.22 - 5.81 MIL/uL   Hemoglobin 11.8 (*) 13.0 - 17.0 g/dL   HCT 35.1 (*) 39.0 - 52.0 %   MCV 89.3  78.0 - 100.0 fL   MCH 30.0  26.0 - 34.0 pg   MCHC 33.6  30.0 - 36.0 g/dL   RDW 14.2  11.5 - 15.5 %   Platelets 261  150 - 400 K/uL   Neutrophils Relative % 68  43 -  77 %   Neutro Abs 4.6  1.7 - 7.7 K/uL   Lymphocytes Relative 15  12 - 46 %   Lymphs Abs 1.0  0.7 - 4.0 K/uL   Monocytes Relative 9  3 - 12 %   Monocytes Absolute 0.6  0.1 - 1.0 K/uL   Eosinophils Relative 8 (*) 0 - 5 %   Eosinophils Absolute 0.5  0.0 - 0.7 K/uL   Basophils Relative 0  0 - 1 %   Basophils Absolute 0.0  0.0 - 0.1 K/uL  PROTIME-INR     Status: None   Collection Time    03/27/14 12:38 PM      Result Value Ref Range   Prothrombin Time 13.0  11.6 - 15.2 seconds   INR 1.00  0.00 - 1.49  TYPE AND SCREEN     Status: None   Collection Time    03/27/14 12:39 PM      Result Value Ref Range   ABO/RH(D) B POS     Antibody Screen NEG     Sample Expiration 03/30/2014    ABO/RH     Status: None   Collection Time    03/27/14 12:39 PM      Result Value Ref Range   ABO/RH(D) B POS    BASIC METABOLIC PANEL     Status: Abnormal   Collection Time    03/27/14  1:05 PM      Result Value Ref Range   Sodium 140  137 - 147 mEq/L   Potassium 4.6  3.7 - 5.3 mEq/L   Chloride 104  96 - 112 mEq/L   CO2 26  19 - 32 mEq/L   Glucose, Bld 91  70 - 99 mg/dL   BUN 20  6 - 23 mg/dL   Creatinine, Ser 0.83   0.50 - 1.35 mg/dL   Calcium 8.4  8.4 - 10.5 mg/dL   GFR calc non Af Amer 83 (*) >90 mL/min   GFR calc Af Amer >90  >90 mL/min  SURGICAL PCR SCREEN     Status: None   Collection Time    03/27/14  6:03 PM      Result Value Ref Range   MRSA, PCR NEGATIVE  NEGATIVE   Staphylococcus aureus NEGATIVE  NEGATIVE    X-ray: CLINICAL DATA: Left hip pain status post fall  EXAM:  LEFT HIP - COMPLETE 2+ VIEW  COMPARISON: CT scan of the abdomen and pelvis dated January 18, 2009  FINDINGS:  The bony pelvis is adequately mineralized. There is no acute  fracture nor dislocation. The SI joints and sacrum exhibit no acute  abnormality.  There is abnormal lucency in the intertrochanteric region on the  left consistent with a nondisplaced fracture. The left hip joint  space is preserved. The femoral head and neck are intact.  IMPRESSION:  There is an acute nondisplaced fracture of the intertrochanteric  region of the left hip.  Electronically Signed  By: David Martinique   ROS No recent hospitalizations, illnesses No cough No chest pain   Blood pressure 154/72, pulse 71, temperature 98.3 F (36.8 C), temperature source Oral, resp. rate 18, height 5' 7"  (1.702 m), weight 67.586 kg (149 lb), SpO2 94.00%.  Physical Exam Awake alert  Inability to perform active hip flexion NVI Tolerates ROM LLE  No pain upper extremities No right lower extremity pain  Assessment/Plan: Left hip non-displaced intertrochanteric hip fracture  NPO To OR for ORIF of left hip  [name] 03/27/2014, 10:10  PM

## 2014-03-27 NOTE — Anesthesia Procedure Notes (Addendum)
Spinal  Patient location during procedure: OR Staffing Anesthesiologist: Nolon Nations R Performed by: anesthesiologist  Preanesthetic Checklist Completed: patient identified, site marked, surgical consent, pre-op evaluation, timeout performed, IV checked, risks and benefits discussed and monitors and equipment checked Spinal Block Patient position: sitting Prep: Betadine Patient monitoring: heart rate, continuous pulse ox and blood pressure Approach: right paramedian Location: L2-3 Injection technique: single-shot Needle Needle type: Sprotte and Spinocan  Needle gauge: 22 G Needle length: 9 cm Assessment Sensory level: T8 Additional Notes Expiration date of kit checked and confirmed. Patient tolerated procedure well, without complications.    Anesthesia Regional Block:  Femoral nerve block  Pre-Anesthetic Checklist: ,, timeout performed, Correct Patient, Correct Site, Correct Laterality, Correct Procedure, Correct Position, site marked, Risks and benefits discussed,  Surgical consent,  Pre-op evaluation,  At surgeon's request and post-op pain management  Laterality: Left  Prep: chloraprep       Needles:  Injection technique: Single-shot  Needle Type: Stimulator Needle - 80     Needle Length: 9cm 9 cm Needle Gauge: 22 and 22 G  Needle insertion depth: 6 cm   Additional Needles:  Procedures: nerve stimulator Femoral nerve block  Nerve Stimulator or Paresthesia:  Response: Twitch elicited, 0.6 mA,   Additional Responses:   Narrative:  Injection made incrementally with aspirations every 5 mL.  Performed by: Personally  Anesthesiologist: Lissa Hoard, MD  Additional Notes: BP cuff, EKG monitors applied. Sedation begun. Femoral artery palpated for location of nerve. After nerve location via ultrasound anesthetic injected incrementally, slowly , and after neg aspirations. Local spread evenly around nerve. No resistance to injection. Tolerated well.

## 2014-03-27 NOTE — ED Provider Notes (Addendum)
CSN: [id number]     Arrival date & time 03/27/14  1223 History   First MD Initiated Contact with Patient 03/27/14 1228     Chief Complaint  Patient presents with  . Fall  . Hip Pain     (Consider location/radiation/quality/duration/timing/severity/associated sxs/prior Treatment) Patient is a 79 y.o. male presenting with fall and hip pain. The history is provided by the patient.  Fall This is a new (tripped by the dog house and fell directly on the left hip.) problem. The current episode started 1 to 2 hours ago. The problem occurs constantly. The problem has not changed since onset.Associated symptoms comments: Severe hip pain.  Unable to walk. Weakness, numbness, head injury or LOC. The symptoms are aggravated by walking, bending, twisting and standing. Nothing relieves the symptoms. He has tried nothing for the symptoms. The treatment provided no relief.  Hip Pain    Past Medical History  Diagnosis Date  . IBS (irritable bowel syndrome)   . Full dentures   . Ulcer   . Hemorrhoids   . GERD (gastroesophageal reflux disease)   . Osteoporosis   . Hiatal hernia   . Chronic kidney disease   . Arthritis     osteoarthritis  back  . CD (celiac disease)     followed by dr Earlean Shawl  . Enlarged prostate     followed by dr  Dorina Hoyer   Past Surgical History  Procedure Laterality Date  . Appendectomy  1963  . Hernia repair  2002    right inguinal   . Inguinal hernia repair  08/19/2011    Procedure: HERNIA REPAIR  left INGUINAL ADULT;  Surgeon: Odis Hollingshead, MD;  Location: [facility];  Service: General;  Laterality: Left;   Family History  Problem Relation Age of Onset  . Cancer Mother     cervical  . Stroke Father   . Cancer Sister     breast  . Cancer Brother     throat  . Cancer Brother     liver  . Cancer Brother     brain   History  Substance Use Topics  . Smoking status: Former Smoker    Quit date: 08/11/1981  . Smokeless tobacco: Never Used  . Alcohol Use: Yes      Comment: beer- socially    Review of Systems  All other systems reviewed and are negative.     Allergies  Review of patient's allergies indicates no known allergies.  Home Medications   Prior to Admission medications   Medication Sig Start Date End Date Taking? Authorizing Provider  calcium carbonate 200 MG capsule Take 250 mg by mouth 2 (two) times daily with a meal.      Historical Provider, MD  cholecalciferol (VITAMIN D) 1000 UNITS tablet Take 1,000 Units by mouth daily.      Historical Provider, MD  doxazosin (CARDURA) 4 MG tablet Take 2 mg by mouth at bedtime.  07/21/11   Historical Provider, MD  fexofenadine (ALLEGRA) 180 MG tablet Take 180 mg by mouth daily.      Historical Provider, MD  Glucosamine Sulfate ([employer] GLUCOSAMINE SULFATE) 1000 MG CAPS Take 2,000 mg by mouth daily.      Historical Provider, MD  HYDROcodone-acetaminophen (VICODIN) 5-500 MG per tablet Take 1 tablet by mouth every 6 (six) hours as needed. For pain 07/18/11   Historical Provider, MD  LYRICA 50 MG capsule Take 50 mg by mouth daily.  05/19/11   Historical Provider, MD  meloxicam [facility])  15 MG tablet Take 15 mg by mouth daily.      Historical Provider, MD  montelukast (SINGULAIR) 10 MG tablet Take 10 mg by mouth at bedtime.      Historical Provider, MD  Multiple Vitamin (MULTIVITAMIN) tablet Take 1 tablet by mouth daily.     Historical Provider, MD  omeprazole (PRILOSEC) 40 MG capsule Take 40 mg by mouth 2 (two) times daily.  06/24/11   Historical Provider, MD  oxyCODONE-acetaminophen (PERCOCET) 5-325 MG per tablet Ad lib. 08/19/11   Historical Provider, MD  rOPINIRole (REQUIP) 1 MG tablet Take 1 mg by mouth at bedtime.  06/24/11   Historical Provider, MD  tiZANidine (ZANAFLEX) 4 MG tablet Take 2-4 mg by mouth at bedtime as needed. For pain; 1 -2 tablets depending on pain level 06/24/11   Historical Provider, MD  zolpidem (AMBIEN) 5 MG tablet Take 5 mg by mouth at bedtime as needed. For sleep    Historical Provider,  MD   BP 132/88  Pulse 66  Temp(Src) 98 F (36.7 C) (Oral)  Resp 16  SpO2 96% Physical Exam  Nursing note and vitals reviewed. Constitutional: He is oriented to person, place, and time. He appears well-developed and well-nourished. No distress.  HENT:  Head: Normocephalic and atraumatic.  Mouth/Throat: Oropharynx is clear and moist.  Eyes: Conjunctivae and EOM are normal. Pupils are equal, round, and reactive to light.  Neck: Normal range of motion. Neck supple.  Cardiovascular: Normal rate, regular rhythm and intact distal pulses.   No murmur heard. Pulmonary/Chest: Effort normal and breath sounds normal. No respiratory distress. He has no wheezes. He has no rales.  Abdominal: Soft. He exhibits no distension. There is no tenderness. There is no rebound and no guarding.  Musculoskeletal: He exhibits no edema.       Left hip: He exhibits decreased range of motion, tenderness, bony tenderness and deformity.  Neurological: He is alert and oriented to person, place, and time. He has normal strength. No sensory deficit.  Skin: Skin is warm and dry. No rash noted. No erythema.  Psychiatric: He has a normal mood and affect. His behavior is normal.    ED Course  Procedures (including critical care time) Labs Review Labs Reviewed  CBC WITH DIFFERENTIAL - Abnormal; Notable for the following:    RBC 3.93 (*)    Hemoglobin 11.8 (*)    HCT 35.1 (*)    Eosinophils Relative 8 (*)    All other components within normal limits  PROTIME-INR  BASIC METABOLIC PANEL  TYPE AND SCREEN  ABO/RH    Imaging Review Dg Chest 1 View  03/27/2014   CLINICAL DATA:  Pain post trauma  EXAM: CHEST - 1 VIEW  COMPARISON:  August 14, 2011  FINDINGS: The lungs are clear. The heart size and pulmonary vascularity are within normal limits. No adenopathy. No bone lesions.  IMPRESSION: No edema or consolidation.   Electronically Signed   By: Lowella Grip M.D.   On: 03/27/2014 13:07   Dg Hip Complete  Left  03/27/2014   CLINICAL DATA:  Left hip pain status post fall  EXAM: LEFT HIP - COMPLETE 2+ VIEW  COMPARISON:  CT scan of the abdomen and pelvis dated January 18, 2009  FINDINGS: The bony pelvis is adequately mineralized. There is no acute fracture nor dislocation. The SI joints and sacrum exhibit no acute abnormality.  There is abnormal lucency in the intertrochanteric region on the left consistent with a nondisplaced fracture. The left hip joint  space is preserved. The femoral head and neck are intact.  IMPRESSION: There is an acute nondisplaced fracture of the intertrochanteric region of the left hip.   Electronically Signed   By: David  Martinique   On: 03/27/2014 13:09     EKG Interpretation   Date/Time:  Monday March 27 2014 12:30:46 EDT Ventricular Rate:  69 PR Interval:  140 QRS Duration: 86 QT Interval:  419 QTC Calculation: 449 R Axis:   71 Text Interpretation:  Sinus rhythm Normal ECG Confirmed by Maryan Rued  MD,  Loree Fee ([id number]) on 03/27/2014 1:33:09 PM      MDM   Final diagnoses:  Fracture, intertrochanteric, left femur    Patient with a mechanical fall today onto his left hip and unable to ambulate or move the legs since due to severe pain. Concern for acute hip fracture. Patient is currently neurovascularly intact. He denies any head injury, LOC and takes no anticoagulation. Hip fracture protocol start  1:20 PM Imaging showing intertroch fx.  Will discuss with ortho and admit to medicine for clearance.  Blanchie Dessert, MD 03/27/14 Dustin, MD 03/27/14 1511

## 2014-03-27 NOTE — Anesthesia Preprocedure Evaluation (Addendum)
Anesthesia Evaluation  Patient identified by MRN, date of birth, ID band Patient awake    Reviewed: Allergy & Precautions, H&P , NPO status , Patient's Chart, lab work & pertinent test results  History of Anesthesia Complications Negative for: history of anesthetic complications  Airway Mallampati: II TM Distance: >3 FB Neck ROM: Full    Dental  (+) Implants, Teeth Intact, Dental Advisory Given   Pulmonary neg shortness of breath, former smoker,  breath sounds clear to auscultation  Pulmonary exam normal       Cardiovascular negative cardio ROS  Rhythm:Regular Rate:Normal     Neuro/Psych negative neurological ROS  negative psych ROS   GI/Hepatic Neg liver ROS, hiatal hernia, PUD, GERD-  Medicated,  Endo/Other  negative endocrine ROS  Renal/GU Renal disease     Musculoskeletal negative musculoskeletal ROS (+)   Abdominal Normal abdominal exam  (+)   Peds  Hematology negative hematology ROS (+)   Anesthesia Other Findings   Reproductive/Obstetrics                          Anesthesia Physical  Anesthesia Plan  ASA: III  Anesthesia Plan: Spinal   Post-op Pain Management:    Induction:   Airway Management Planned:   Additional Equipment:   Intra-op Plan:   Post-operative Plan:   Informed Consent: I have reviewed the patients History and Physical, chart, labs and discussed the procedure including the risks, benefits and alternatives for the proposed anesthesia with the patient or authorized representative who has indicated his/her understanding and acceptance.   Dental advisory given  Plan Discussed with: CRNA  Anesthesia Plan Comments:        Anesthesia Quick Evaluation

## 2014-03-27 NOTE — ED Notes (Signed)
Bed: PS88 Expected date:  Expected time:  Means of arrival:  Comments: ems- hip pain

## 2014-03-27 NOTE — ED Notes (Signed)
Per ems pt c/o fell outside, tripped over his dog outside, landed on his left hip. Pain 9/10. Pt found on his left side, in position of comfort. Pt reports he cannot move left leg at all, able to wiggle toes.

## 2014-03-28 ENCOUNTER — Encounter (HOSPITAL_COMMUNITY): Payer: Self-pay | Admitting: Orthopedic Surgery

## 2014-03-28 DIAGNOSIS — S72143A Displaced intertrochanteric fracture of unspecified femur, initial encounter for closed fracture: Secondary | ICD-10-CM | POA: Diagnosis not present

## 2014-03-28 LAB — CBC
HEMATOCRIT: 35.6 % — AB (ref 39.0–52.0)
Hemoglobin: 11.5 g/dL — ABNORMAL LOW (ref 13.0–17.0)
MCH: 29.9 pg (ref 26.0–34.0)
MCHC: 32.3 g/dL (ref 30.0–36.0)
MCV: 92.5 fL (ref 78.0–100.0)
Platelets: 161 10*3/uL (ref 150–400)
RBC: 3.85 MIL/uL — ABNORMAL LOW (ref 4.22–5.81)
RDW: 14.3 % (ref 11.5–15.5)
WBC: 6.8 10*3/uL (ref 4.0–10.5)

## 2014-03-28 LAB — BASIC METABOLIC PANEL
BUN: 14 mg/dL (ref 6–23)
CO2: 26 mEq/L (ref 19–32)
Calcium: 8.5 mg/dL (ref 8.4–10.5)
Chloride: 102 mEq/L (ref 96–112)
Creatinine, Ser: 0.78 mg/dL (ref 0.50–1.35)
GFR calc Af Amer: >90 mL/min (ref >90)
GFR calc non Af Amer: 85 mL/min — ABNORMAL LOW (ref >90)
GLUCOSE: 209 mg/dL — AB (ref 70–99)
POTASSIUM: 4.4 meq/L (ref 3.7–5.3)
Sodium: 138 mEq/L (ref 137–147)

## 2014-03-28 MED ORDER — FERROUS SULFATE 325 (65 FE) MG PO TABS
325.0000 mg | ORAL_TABLET | Freq: Three times a day (TID) | ORAL | Status: DC
  Administered 2014-03-28 – 2014-03-30 (×6): 325 mg via ORAL
  Filled 2014-03-28 (×10): qty 1

## 2014-03-28 MED ORDER — CEFAZOLIN SODIUM-DEXTROSE 2-3 GM-% IV SOLR
2.0000 g | Freq: Four times a day (QID) | INTRAVENOUS | Status: AC
  Administered 2014-03-28 (×2): 2 g via INTRAVENOUS
  Filled 2014-03-28 (×2): qty 50

## 2014-03-28 MED ORDER — HYDRALAZINE HCL 20 MG/ML IJ SOLN
5.0000 mg | Freq: Four times a day (QID) | INTRAMUSCULAR | Status: DC | PRN
  Filled 2014-03-28: qty 0.25

## 2014-03-28 MED ORDER — METOCLOPRAMIDE HCL 5 MG/ML IJ SOLN: 5.0000 mg | Freq: Three times a day (TID) | INTRAMUSCULAR | Status: DC | PRN

## 2014-03-28 MED ORDER — ASPIRIN EC 325 MG PO TBEC
325.0000 mg | DELAYED_RELEASE_TABLET | Freq: Two times a day (BID) | ORAL | Status: DC
  Administered 2014-03-29 – 2014-03-30 (×3): 325 mg via ORAL
  Filled 2014-03-28 (×5): qty 1

## 2014-03-28 MED ORDER — ONDANSETRON HCL 4 MG PO TABS: 4.0000 mg | ORAL_TABLET | Freq: Four times a day (QID) | ORAL | Status: DC | PRN

## 2014-03-28 MED ORDER — METOCLOPRAMIDE HCL 10 MG PO TABS: 5.0000 mg | ORAL_TABLET | Freq: Three times a day (TID) | ORAL | Status: DC | PRN

## 2014-03-28 MED ORDER — HYDROCODONE-ACETAMINOPHEN 5-325 MG PO TABS
1.0000 | ORAL_TABLET | ORAL | Status: DC | PRN
  Administered 2014-03-28 – 2014-03-29 (×6): 2 via ORAL
  Administered 2014-03-29: 1 via ORAL
  Administered 2014-03-29 – 2014-03-30 (×4): 2 via ORAL
  Filled 2014-03-28 (×11): qty 2

## 2014-03-28 MED ORDER — PHENOL 1.4 % MT LIQD
1.0000 | OROMUCOSAL | Status: DC | PRN
  Filled 2014-03-28: qty 177

## 2014-03-28 MED ORDER — HYDROCODONE-ACETAMINOPHEN 5-325 MG PO TABS
1.0000 | ORAL_TABLET | Freq: Four times a day (QID) | ORAL | Status: DC | PRN
  Administered 2014-03-28: 2 via ORAL
  Filled 2014-03-28 (×2): qty 1

## 2014-03-28 MED ORDER — POLYETHYLENE GLYCOL 3350 17 G PO PACK: 17.0000 g | PACK | Freq: Every day | ORAL | Status: DC | PRN

## 2014-03-28 MED ORDER — ONDANSETRON HCL 4 MG/2ML IJ SOLN: 4.0000 mg | Freq: Four times a day (QID) | INTRAMUSCULAR | Status: DC | PRN

## 2014-03-28 MED ORDER — MENTHOL 3 MG MT LOZG
1.0000 | LOZENGE | OROMUCOSAL | Status: DC | PRN
  Filled 2014-03-28: qty 9

## 2014-03-28 MED ORDER — DOCUSATE SODIUM 100 MG PO CAPS
100.0000 mg | ORAL_CAPSULE | Freq: Two times a day (BID) | ORAL | Status: DC
  Administered 2014-03-28 – 2014-03-30 (×5): 100 mg via ORAL

## 2014-03-28 NOTE — Anesthesia Postprocedure Evaluation (Signed)
Anesthesia Post Note  Patient: [name]  Procedure(s) Performed: Procedure(s) (LRB): INTRAMEDULLARY (IM) NAIL INTERTROCHANTRIC (Left)  Anesthesia type: Spinal  Patient location: PACU  Post pain: Pain level controlled  Post assessment: Post-op Vital signs reviewed  Last Vitals: BP 118/63  Pulse 58  Temp(Src) 36.4 C (Oral)  Resp 18  Ht 5' 7"  (1.702 m)  Wt 149 lb (67.586 kg)  BMI 23.33 kg/m2  SpO2 100%  Post vital signs: Reviewed  Level of consciousness: sedated  Complications: No apparent anesthesia complications

## 2014-03-28 NOTE — Progress Notes (Signed)
Clinical Social Work Department BRIEF PSYCHOSOCIAL ASSESSMENT 03/28/2014  Patient:  [name], [name]     Account Number:  [id number]     Admit date:  03/27/2014  Clinical Social Worker:  Lacie Scotts  Date/Time:  03/28/2014 01:26 PM  Referred by:  Physician  Date Referred:  03/28/2014 Referred for  SNF Placement   Other Referral:   Interview type:  Patient Other interview type:    PSYCHOSOCIAL DATA Living Status:  WIFE Admitted from facility:   Level of care:   Primary support name:  [name] Primary support relationship to patient:  SPOUSE Degree of support available:   supportive    CURRENT CONCERNS Current Concerns  Post-Acute Placement   Other Concerns:    SOCIAL WORK ASSESSMENT / PLAN Pt is a 79 yr old gentleman living at home with spouse prior to hospitalization. Pt fell at home and Fx his hip. Surgery was preformed on 6/15. CSW met with pt / spouse to assist with d/c planning. ST Rehab will be needed following hospital d/c. Pt / spouse are willing to consider this option. SNF search has been initiated and bed offers have be provided. Spouse will tour facilities this afternoon.   Assessment/plan status:  Psychosocial Support/Ongoing Assessment of Needs Other assessment/ plan:   Information/referral to community resources:   SNF bed offers reviewed with pt / spouse. Insurance coverage for SNF and ambulance transport reviewed as well.    PATIENT'S/FAMILY'S RESPONSE TO PLAN OF CARE: Pt feels ST Rehab will be needed at d/c. He would like a facility close to home. Spouse supports this decision. Pt is motivated to work with PT.   Werner Lean LCSW [phone]

## 2014-03-28 NOTE — Progress Notes (Signed)
INITIAL NUTRITION ASSESSMENT  DOCUMENTATION CODES Per approved criteria  -Not Applicable   INTERVENTION: -Continue with Gluten Free Diet -Encouraged continued excellent intake of protein/kcal for post op recovery -Consider addition of Ensure Pudding(GF) BID if PO intake decreases to <75%  NUTRITION DIAGNOSIS: Increased nutrient needs (protein/kcal) related to increased demand for nutrients as evidenced by s/p hip fracture.   Goal: Pt to meet >/= 90% of their estimated nutrition needs    Monitor:  Total protein/energy intake, labs, weights  Reason for Assessment: Consult  79 y.o. male  Admitting Dx: <principal problem not specified>  ASSESSMENT: 79 year old male with past medical history of IBS, GERD, osteoarthritis, enlarged prostate who presented to [facility] ED 03/27/2014 status post fall at home  -Pt with excellent appetite, consuming 100% of balanced meals. Currently on gluten free diet d/t celiac disease-is able to tolerate variety of foods: eggs, grits, fruits, bacon, yogurt, gluten free sandwiches -Denied any changes in weight or appetite pta -Was dx with Celiac Diease five years ago, had visited RD for counseling and advice. Experienced an unintentional wt gain of 30 lbs. -Pt noted he was able to lose 10lbs, and is able to maintain weight around 150 lbs with portion control. Family assists in gluten free compliance -Pt s/p left hip fx, Encouraged intake of protein/kcal for recovery. Pt's excellent PO intake  likely meeting estimated nutrition needs, consider addition of GF snacks or Ensure pudding (product is Gluten Free) if PO intake decreases to <75%  Height: Ht Readings from Last 1 Encounters:  03/27/14 5\' 7"  (1.702 m)    Weight: Wt Readings from Last 1 Encounters:  03/27/14 149 lb (67.586 kg)    Ideal Body Weight: 154 lbs  % Ideal Body Weight: 97%  Wt Readings from Last 10 Encounters:  03/27/14 149 lb (67.586 kg)  03/27/14 149 lb (67.586 kg)  09/15/11 144 lb  6.4 oz (65.499 kg)  08/14/11 142 lb 10.2 oz (64.7 kg)  08/12/11 145 lb (65.772 kg)  06/05/09 163 lb (73.936 kg)    Usual Body Weight: 150 lbs  % Usual Body Weight: 99%  BMI:  Body mass index is 23.33 kg/(m^2).  Estimated Nutritional Needs: Kcal: 1800-2000 Protein: 75-90 gram Fluid: >/=1800 ml/daily  Skin: surgical incision on hip, + 1 LLE edema  Diet Order: Gluten Restricted  EDUCATION NEEDS: -Education needs addressed   Intake/Output Summary (Last 24 hours) at 03/28/14 1322 Last data filed at 03/28/14 1236  Gross per 24 hour  Intake 2288.75 ml  Output   2700 ml  Net -411.25 ml    Last BM: 6/15   Labs:   Recent Labs Lab 03/27/14 1305 03/28/14 0427  NA 140 138  K 4.6 4.4  CL 104 102  CO2 26 26  BUN 20 14  CREATININE 0.83 0.78  CALCIUM 8.4 8.5  GLUCOSE 91 209*    CBG (last 3)  No results found for this basename: GLUCAP,  in the last 72 hours  Scheduled Meds: . [START ON 03/29/2014] aspirin EC  325 mg Oral BID  . calcium carbonate  1 tablet Oral BID WC  . cholecalciferol  1,000 Units Oral Daily  . docusate sodium  100 mg Oral BID  . doxazosin  2 mg Oral QHS  . ferrous sulfate  325 mg Oral TID PC  . hydrALAZINE  5 mg Intravenous Q6H  . loratadine  10 mg Oral Daily  . montelukast  10 mg Oral QHS  . multivitamin with minerals  1 tablet Oral  Daily  . pantoprazole  40 mg Oral Daily  . pregabalin  50 mg Oral Daily  . rOPINIRole  1 mg Oral QHS    Continuous Infusions:   Past Medical History  Diagnosis Date  . IBS (irritable bowel syndrome)   . Full dentures   . Ulcer   . Hemorrhoids   . GERD (gastroesophageal reflux disease)   . Osteoporosis   . Hiatal hernia   . Chronic kidney disease   . Arthritis     osteoarthritis  back  . CD (celiac disease)     followed by dr Earlean Shawl  . Enlarged prostate     followed by dr  Dorina Hoyer    Past Surgical History  Procedure Laterality Date  . Appendectomy  1963  . Hernia repair  2002    right inguinal    . Inguinal hernia repair  08/19/2011    Procedure: HERNIA REPAIR  left INGUINAL ADULT;  Surgeon: Odis Hollingshead, MD;  Location: [facility];  Service: General;  Laterality: Left;  Marland Kitchen Eye surgery Bilateral     cataract removal - implants    Atlee Abide [place] LDN Clinical Dietitian [phone]

## 2014-03-28 NOTE — Progress Notes (Signed)
Patient ID: [name], male   DOB: 08-16-1935, 79 y.o.   MRN: [id number] Subjective: 1 Day Post-Op Procedure(s) (LRB): INTRAMEDULLARY (IM) NAIL INTERTROCHANTRIC (Left)    Patient reports pain as mild.  Did well overnight, no events  Objective:   VITALS:   Filed Vitals:   03/28/14 0943  BP: 158/63  Pulse: 94  Temp: 98.7 F (37.1 C)  Resp: 16    Neurovascular intact Incision: dressing C/D/I  LABS  Recent Labs  03/27/14 1238 03/28/14 0427  HGB 11.8* 11.5*  HCT 35.1* 35.6*  WBC 6.6 6.8  PLT 261 161     Recent Labs  03/27/14 1305 03/28/14 0427  NA 140 138  K 4.6 4.4  BUN 20 14  CREATININE 0.83 0.78  GLUCOSE 91 209*     Recent Labs  03/27/14 1238  INR 1.00     Assessment/Plan: 1 Day Post-Op Procedure(s) (LRB): INTRAMEDULLARY (IM) NAIL INTERTROCHANTRIC (Left)   Advance diet Up with therapy  Discharge plan pending therapy evaluation and recommendations Reviewed with patient and family

## 2014-03-28 NOTE — Progress Notes (Signed)
CARE MANAGEMENT NOTE 03/28/2014  Patient:  FAUSTINO, LUECKE   Account Number:  [id number]  Date Initiated:  03/28/2014  Documentation initiated by:  DAVIS,RHONDA  Subjective/Objective Assessment:   pt fell has fractured Left intertrochanteric femur     Action/Plan:   hhc versus snf lives at home with spouse   Anticipated DC Date:  03/31/2014   Anticipated DC Plan:  [place] referral  NA      DC Planning Services  CM consult      [facility] Choice  NA   Choice offered to / List presented to:  C-4 Adult Children           Status of service:  In process, will continue to follow Medicare Important Message given?  NA - LOS <3 / Initial given by admissions (If response is "NO", the following Medicare IM given date fields will be blank) Date Medicare IM given:   Date Additional Medicare IM given:    Discharge Disposition:    Per UR Regulation:  Reviewed for med. necessity/level of care/duration of stay  If discussed at [place] of Stay Meetings, dates discussed:    Comments:  [id number]/Rhonda Davis,Rn,BSn,CCM:

## 2014-03-28 NOTE — Evaluation (Signed)
Physical Therapy Evaluation Patient Details Name: [name] MRN: [id number] DOB: [birth year] Today's Date: 03/28/2014   History of Present Illness  Pt is a 79 year old male s/p ORIF of left intertrochanteric femur fracture.  Clinical Impression  Patient is s/p L femur ORIF surgery resulting in functional limitations due to the deficits listed below (see PT Problem List).  Patient will benefit from skilled PT to increase their independence and safety with mobility to allow discharge to the venue listed below.  Pt currently requiring min assist for safety and stability during mobility and agreeable to ST-SNF at this time.  Pt also performed LE exercises as he was eager to start rehab for L LE.     Follow Up Recommendations SNF    Equipment Recommendations  None recommended by PT    Recommendations for Other Services       Precautions / Restrictions Precautions Precautions: Fall Restrictions Other Position/Activity Restrictions: WBAT      Mobility  Bed Mobility Overal bed mobility: Needs Assistance Bed Mobility: Supine to Sit     Supine to sit: Min assist;HOB elevated     General bed mobility comments: verbal cues for technique, assist for L LE  Transfers Overall transfer level: Needs assistance Equipment used: Rolling walker (2 wheeled) Transfers: Sit to/from Stand Sit to Stand: Min assist         General transfer comment: verbal cues for safe technique including UE and LE placement  Ambulation/Gait Ambulation/Gait assistance: Min assist Ambulation Distance (Feet): 30 Feet Assistive device: Rolling walker (2 wheeled) Gait Pattern/deviations: Step-to pattern;Antalgic Gait velocity: decr   General Gait Details: verbal cues for sequence, technique, RW distance, step length, and WBing through UEs when stepping on L LE as pt with a few instances of L LE buckling requiring assist   Stairs            Wheelchair Mobility    Modified Rankin (Stroke  Patients Only)       Balance                                             Pertinent Vitals/Pain Premedicated, reports mild pain L hip, activity to tolerance, repositioned    Home Living Family/patient expects to be discharged to:: Skilled nursing facility Living Arrangements: Spouse/significant other                    Prior Function Level of Independence: Independent               Hand Dominance        Extremity/Trunk Assessment               Lower Extremity Assessment: LLE deficits/detail   LLE Deficits / Details: decreased active hip movement against gravity requiring assist     Communication   Communication: No difficulties  Cognition Arousal/Alertness: Awake/alert Behavior During Therapy: WFL for tasks assessed/performed Overall Cognitive Status: Within Functional Limits for tasks assessed                      General Comments      Exercises General Exercises - Lower Extremity Ankle Circles/Pumps: AROM;Both;20 reps Quad Sets: AROM;Both;10 reps Gluteal Sets: AROM;Both;10 reps Short Arc QuadSinclair Ship;Left;10 reps Heel Slides: AAROM;Left;10 reps Hip ABduction/ADduction: AAROM;Left;10 reps      Assessment/Plan    PT Assessment Patient needs  continued PT services  PT Diagnosis Difficulty walking;Acute pain   PT Problem List Decreased strength;Decreased mobility;Decreased knowledge of use of DME;Pain;Decreased range of motion  PT Treatment Interventions DME instruction;Gait training;Functional mobility training;Patient/family education;Therapeutic activities;Therapeutic exercise   PT Goals (Current goals can be found in the Care Plan section) Acute Rehab PT Goals PT Goal Formulation: With patient Time For Goal Achievement: 04/04/14 Potential to Achieve Goals: Good    Frequency 7X/week   Barriers to discharge        Co-evaluation               End of Session Equipment Utilized During Treatment:  Gait belt Activity Tolerance: Patient tolerated treatment well Patient left: in chair;with family/visitor present;with call bell/phone within reach Nurse Communication: Mobility status         Time: 1135-1203 PT Time Calculation (min): 28 min   Charges:   PT Evaluation $Initial PT Evaluation Tier I: 1 Procedure PT Treatments $Gait Training: 8-22 mins $Therapeutic Exercise: 8-22 mins   PT G Codes:          LEMYRE,KATHrine E 03/28/2014, 12:24 PM Carmelia Bake, PT, DPT 03/28/2014 Pager: [phone]

## 2014-03-28 NOTE — Progress Notes (Signed)
Clinical Social Work Department CLINICAL SOCIAL WORK PLACEMENT NOTE 03/28/2014  Patient:  [name], [name]  Account Number:  [id number] Admit date:  03/27/2014  Clinical Social Worker:  Werner Lean, LCSW  Date/time:  03/28/2014 01:31 PM  Clinical Social Work is seeking post-discharge placement for this patient at the following level of care:   SKILLED NURSING   (*CSW will update this form in Epic as items are completed)   03/28/2014  Patient/family provided with [place] Department of Clinical Social Work's list of facilities offering this level of care within the geographic area requested by the patient (or if unable, by the patient's family).  03/28/2014  Patient/family informed of their freedom to choose among providers that offer the needed level of care, that participate in Medicare, Medicaid or managed care program needed by the patient, have an available bed and are willing to accept the patient.  03/28/2014  Patient/family informed of [facility]' ownership interest in [facility], as well as of the fact that they are under no obligation to receive care at this facility.  PASARR submitted to EDS on 03/28/2014 PASARR number received on 03/28/2014  FL2 transmitted to all facilities in geographic area requested by pt/family on  03/28/2014 FL2 transmitted to all facilities within larger geographic area on   Patient informed that his/her managed care company has contracts with or will negotiate with  certain facilities, including the following:     Patient/family informed of bed offers received:  03/28/2014 Patient chooses bed at  Physician recommends and patient chooses bed at    Patient to be transferred to  on   Patient to be transferred to facility by  Patient and family notified of transfer on  Name of family member notified:    The following physician request were entered in Epic:   Additional Comments:  Werner Lean LCSW [phone]

## 2014-03-28 NOTE — Progress Notes (Signed)
[facility] PROGRESS NOTE   WENZLICK URK:[id number] DOB: [birth year] DOA: 03/27/2014 PCP: Purvis Kilts, MD  Assessment/Plan:  Active Problems: Fracture, intertrochanteric, left femur/Hip fracture - Ortho managing - Pain controlled relatively well on current regimen per my discussion with patient.  HTN - Hydralazine prn elevated Blood pressures  BPH - on cardura  Code Status: full Family Communication: *discussed with patient and family at bedside Disposition Plan: Pending specialist recommendations and PT evaluation   Consultants:  Ortho  Procedures: 1 Day Post-Op Procedure(s) (LRB):  INTRAMEDULLARY (IM) NAIL INTERTROCHANTRIC (Left   Antibiotics:  None  HPI/Subjective: Pt has no new complaints. No acute issues reported overnight. Pain tolerable on current pain medication regimen.  Objective: Filed Vitals:   03/28/14 1352  BP: 164/73  Pulse: 61  Temp: 98.3 F (36.8 C)  Resp: 16    Intake/Output Summary (Last 24 hours) at 03/28/14 1656 Last data filed at 03/28/14 1357  Gross per 24 hour  Intake 2523.75 ml  Output   1650 ml  Net 873.75 ml   Filed Weights   03/27/14 1600  Weight: 67.586 kg (149 lb)    Exam:   General:  Pt in NAD, alert and awake  Cardiovascular: RRR, no MRG  Respiratory: CTA BL, no wheezes  Abdomen: soft, NT, ND  Musculoskeletal: no cyanosis or clubbing   Data Reviewed: Basic Metabolic Panel:  Recent Labs Lab 03/27/14 1305 03/28/14 0427  NA 140 138  K 4.6 4.4  CL 104 102  CO2 26 26  GLUCOSE 91 209*  BUN 20 14  CREATININE 0.83 0.78  CALCIUM 8.4 8.5   Liver Function Tests: No results found for this basename: AST, ALT, ALKPHOS, BILITOT, PROT, ALBUMIN,  in the last 168 hours No results found for this basename: LIPASE, AMYLASE,  in the last 168 hours No results found for this basename: AMMONIA,  in the last 168 hours CBC:  Recent Labs Lab 03/27/14 1238 03/28/14 0427  WBC 6.6 6.8  NEUTROABS  4.6  --   HGB 11.8* 11.5*  HCT 35.1* 35.6*  MCV 89.3 92.5  PLT 261 161   Cardiac Enzymes: No results found for this basename: CKTOTAL, CKMB, CKMBINDEX, TROPONINI,  in the last 168 hours BNP (last 3 results) No results found for this basename: PROBNP,  in the last 8760 hours CBG: No results found for this basename: GLUCAP,  in the last 168 hours  Recent Results (from the past 240 hour(s))  SURGICAL PCR SCREEN     Status: None   Collection Time    03/27/14  6:03 PM      Result Value Ref Range Status   MRSA, PCR NEGATIVE  NEGATIVE Final   Staphylococcus aureus NEGATIVE  NEGATIVE Final   Comment:            The Xpert SA Assay (FDA     approved for NASAL specimens     in patients over 77 years of age),     is one component of     a comprehensive surveillance     program.  Test performance has     been validated by [employer] for patients greater     than or equal to 77 year old.     It is not intended     to diagnose infection nor to     guide or monitor treatment.     Studies: Dg Chest 1 View  03/27/2014   CLINICAL DATA:  Pain post  trauma  EXAM: CHEST - 1 VIEW  COMPARISON:  August 14, 2011  FINDINGS: The lungs are clear. The heart size and pulmonary vascularity are within normal limits. No adenopathy. No bone lesions.  IMPRESSION: No edema or consolidation.   Electronically Signed   By: Lowella Grip M.D.   On: 03/27/2014 13:07   Dg Hip Complete Left  03/27/2014   CLINICAL DATA:  Left hip pain status post fall  EXAM: LEFT HIP - COMPLETE 2+ VIEW  COMPARISON:  CT scan of the abdomen and pelvis dated January 18, 2009  FINDINGS: The bony pelvis is adequately mineralized. There is no acute fracture nor dislocation. The SI joints and sacrum exhibit no acute abnormality.  There is abnormal lucency in the intertrochanteric region on the left consistent with a nondisplaced fracture. The left hip joint space is preserved. The femoral head and neck are intact.  IMPRESSION: There is an  acute nondisplaced fracture of the intertrochanteric region of the left hip.   Electronically Signed   By: David  Martinique   On: 03/27/2014 13:09   Dg Hip Operative Left  03/28/2014   CLINICAL DATA:  ORIF left hip.  EXAM: OPERATIVE LEFT HIP  COMPARISON:  None.  FINDINGS: Three intraoperative fluoroscopic spot films demonstrate placement of a left hip gamma nail with short femoral nail. Distal interlocking screw present.  IMPRESSION: Left hip gamma nail placement.   Electronically Signed   By: Dereck Ligas M.D.   On: 03/28/2014 02:51    Scheduled Meds: . [START ON 03/29/2014] aspirin EC  325 mg Oral BID  . calcium carbonate  1 tablet Oral BID WC  . cholecalciferol  1,000 Units Oral Daily  . docusate sodium  100 mg Oral BID  . doxazosin  2 mg Oral QHS  . ferrous sulfate  325 mg Oral TID PC  . loratadine  10 mg Oral Daily  . montelukast  10 mg Oral QHS  . multivitamin with minerals  1 tablet Oral Daily  . pantoprazole  40 mg Oral Daily  . pregabalin  77 mg Oral Daily  . rOPINIRole  77 mg Oral QHS   Continuous Infusions:    Time spent: > 35 minutes    Velvet Bathe  [facility] Pager [phone] If 7PM-7AM, please contact night-coverage at [email or web address], password [facility] 03/28/2014, 4:56 PM  LOS: 1 day

## 2014-03-28 NOTE — Op Note (Signed)
[name], [name] NO.:  [id number]  MEDICAL RECORD NO.:  [id number]  LOCATION:  2130                         FACILITY:  [facility]  PHYSICIAN:  Pietro Cassis. Alvan Dame, M.D.  DATE OF BIRTH:  1935-08-22  DATE OF PROCEDURE:  03/27/2014 DATE OF DISCHARGE:                              OPERATIVE REPORT   PREOPERATIVE DIAGNOSIS:  Left intertrochanteric femur fracture.  POSTOPERATIVE DIAGNOSIS:  Left intertrochanteric femur fracture.  PROCEDURE:  Open reduction and internal fixation of left intertrochanteric femur fracture utilizing a Biomet AFFIXUS intramedullary nail, 11 x 180 mm with an 100 mm lag screw and distal interlock.  SURGEON:  Pietro Cassis. Alvan Dame, M.D.  ASSISTANT:  Surgical team.  ANESTHESIA:  Preoperative regional femoral nerve block plus spinal block anesthetic.  SPECIMENS:  None.  COMPLICATIONS:  None.  BLOOD LOSS:  Less than 50 mL.  INDICATIONS FOR PROCEDURE:  [name] is a very pleasant 79 year old male who unfortunately had a ground level fall in his garden today when he stumbled backwards over his small dog, he fell to his left side, had immediate onset of pain and inability to bear weight.  He was brought to the emergency room and radiographs revealed nondisplaced intertrochanteric femur fracture without significant comminution.  I reviewed with him the risks, benefits, and necessity of surgical procedure.  Consent was obtained for management of this fracture.  Risks of nonunion, failure of fixation, need for future surgery were all reviewed and discussed.  PROCEDURE IN DETAIL:  The patient was brought to the operative theater. Once adequate anesthesia, preoperative antibiotics, Ancef administered, the patient was positioned supine.  His right unaffected extremity was flexed and abducted out of the way with bony prominences padded particularly the peroneal nerve.  The left lower extremity was placed in traction boot and slight traction and  internal rotation was applied for stability of lower extremity. Fluoroscopy was brought in the field confirming reduction of the fracture, positioned component under the hip.  At this point, a time-out was performed identifying the patient, planned procedure, and extremity.  Left lower extremity was then prepped and draped in sterile fashion using shower curtain technique.  Fluoroscopy was brought back to the field.  Landmarks were identified.  An incision was then made in the proximal tip of the trochanter laterally.  Soft tissue dissection was carried down to the iliotibial band and gluteal fascia, which were incised for this posterior insertion.  Guidewire was then inserted into the tip of the trochanter, passed across the fracture site.  Proximal femur was then drilled open.  I then passed by hand the intramedullary nail.  Once it was at its appropriate location, utilizing the guide off the insertion jig, the guidewire was inserted into the AP and lateral planes.  I then measured, drilled, placed a 100 mm lag screw and then compressed this.  Following compression and medialization of the shaft of the fracture site, the distal interlock was positioned to the distal tip measuring 36 mm.  At this point, the jig was removed.  Final radiographs were obtained in AP and lateral planes.  Wound was irrigated with normal saline solution. The proximal gluteal fascia was reapproximated using #1 Vicryl.  The remainder of the wounds were closed with 2-0 Vicryl.  I used 4-0 Monocryl proximally, utilized Dermabond on the skin otherwise.  The hip wounds were then cleaned, dried, and dressed sterilely using an Aquacel dressing.  He was then brought to the recovery room in stable condition tolerating the procedure well.     Pietro Cassis Alvan Dame, M.D.     MDO/MEDQ  D:  03/28/2014  T:  03/28/2014  Job:  [id number]

## 2014-03-29 ENCOUNTER — Encounter (HOSPITAL_COMMUNITY): Payer: Self-pay | Admitting: Internal Medicine

## 2014-03-29 DIAGNOSIS — I10 Essential (primary) hypertension: Secondary | ICD-10-CM | POA: Diagnosis not present

## 2014-03-29 DIAGNOSIS — D62 Acute posthemorrhagic anemia: Secondary | ICD-10-CM | POA: Diagnosis not present

## 2014-03-29 DIAGNOSIS — S72143A Displaced intertrochanteric fracture of unspecified femur, initial encounter for closed fracture: Secondary | ICD-10-CM | POA: Diagnosis not present

## 2014-03-29 DIAGNOSIS — N400 Enlarged prostate without lower urinary tract symptoms: Secondary | ICD-10-CM | POA: Diagnosis not present

## 2014-03-29 HISTORY — DX: Acute posthemorrhagic anemia: D62

## 2014-03-29 HISTORY — DX: Essential (primary) hypertension: I10

## 2014-03-29 LAB — BASIC METABOLIC PANEL
BUN: 15 mg/dL (ref 6–23)
CALCIUM: 8.4 mg/dL (ref 8.4–10.5)
CO2: 29 meq/L (ref 19–32)
Chloride: 102 mEq/L (ref 96–112)
Creatinine, Ser: 0.89 mg/dL (ref 0.50–1.35)
GFR calc Af Amer: >90 mL/min (ref >90)
GFR, EST NON AFRICAN AMERICAN: 81 mL/min — AB (ref >90)
GLUCOSE: 107 mg/dL — AB (ref 70–99)
Potassium: 4 mEq/L (ref 3.7–5.3)
SODIUM: 139 meq/L (ref 137–147)

## 2014-03-29 LAB — CBC
HCT: 30.6 % — ABNORMAL LOW (ref 39.0–52.0)
HEMOGLOBIN: 10.1 g/dL — AB (ref 13.0–17.0)
MCH: 30.1 pg (ref 26.0–34.0)
MCHC: 33 g/dL (ref 30.0–36.0)
MCV: 91.1 fL (ref 78.0–100.0)
Platelets: 158 10*3/uL (ref 150–400)
RBC: 3.36 MIL/uL — ABNORMAL LOW (ref 4.22–5.81)
RDW: 14.4 % (ref 11.5–15.5)
WBC: 9.6 10*3/uL (ref 4.0–10.5)

## 2014-03-29 MED ORDER — AMLODIPINE BESYLATE 5 MG PO TABS
5.0000 mg | ORAL_TABLET | Freq: Every day | ORAL | Status: DC
  Administered 2014-03-29 – 2014-03-30 (×2): 5 mg via ORAL
  Filled 2014-03-29 (×2): qty 1

## 2014-03-29 MED ORDER — HYDROCODONE-ACETAMINOPHEN 5-325 MG PO TABS: 1.0000 | ORAL_TABLET | Freq: Four times a day (QID) | ORAL | Status: DC | PRN

## 2014-03-29 MED ORDER — MAGNESIUM HYDROXIDE 400 MG/5ML PO SUSP
30.0000 mL | Freq: Once | ORAL | Status: AC
  Administered 2014-03-29: 30 mL via ORAL
  Filled 2014-03-29: qty 30

## 2014-03-29 NOTE — Progress Notes (Signed)
OT Cancellation Note  Patient Details Name: ORON WESTRUP MRN: [id number] DOB: [birth year]   Cancelled Treatment:    Reason Eval/Treat Not Completed: Other (comment) Note plan is SNF. Will defer OT to SNF.   Jules Schick [phone] 03/29/2014, 8:23 AM

## 2014-03-29 NOTE — Progress Notes (Signed)
   Subjective: 2 Days Post-Op Procedure(s) (LRB): INTRAMEDULLARY (IM) NAIL INTERTROCHANTRIC (Left)   Patient reports pain as mild, pain controlled. No events throughout the night. Plan would be for eventual SNF discharge.  Objective:   VITALS:   Filed Vitals:   03/29/14 0647  BP: 112/62  Pulse: 64  Temp: 98.1 F (36.7 C)  Resp: 16    Dorsiflexion/Plantar flexion intact Incision: dressing C/D/I No cellulitis present Compartment soft  LABS  Recent Labs  03/27/14 1238 03/28/14 0427 03/29/14 0358  HGB 11.8* 11.5* 10.1*  HCT 35.1* 35.6* 30.6*  WBC 6.6 6.8 9.6  PLT 261 161 158     Recent Labs  03/27/14 1305 03/28/14 0427 03/29/14 0358  NA 140 138 139  K 4.6 4.4 4.0  BUN 20 14 15   CREATININE 0.83 0.78 0.89  GLUCOSE 91 209* 107*     Assessment/Plan: 2 Days Post-Op Procedure(s) (LRB): INTRAMEDULLARY (IM) NAIL INTERTROCHANTRIC (Left) Up with therapy Discharge to SNF eventually, when ready  Ortho recommendations: WBAT on the left leg ASA 325 mg bid for 4 weeks for anticoagulation, unless other medically indicated. Norco for pain management (Rx written). MiraLax and Colace for constipation Iron 325 mg tid for 2-3 weeks  Dressing to remain in place until follow in clinic in 2 weeks. Dressing is waterproof and may shower with it in place. Follow up in 2 weeks at [facility]. Follow up with OLIN,MATTHEW D in 2 weeks.  Contact information:  [facility] [address] [place]   PAC  03/29/2014, 9:53 AM

## 2014-03-29 NOTE — Progress Notes (Addendum)
Progress Note   [name] QDI:[id number] DOB: [birth year] DOA: 03/27/2014 PCP: Purvis Kilts, MD   Brief Narrative:   [name] is an 79 y.o. male with a PMH of IBS, GERD, osteoarthritis, prostatomegaly was admitted 03/27/14 after suffering from a mechanical fall. Upon initial evaluation in the ER, he was found to have a left hip intertrochanteric fracture.  Assessment/Plan:   Active Problems: Fracture, intertrochanteric, left femur  Status post intramedullary nail per Dr. Alvan Dame.  Weightbearing as tolerated.  Aspirin 325 mg twice a day x4 weeks for anticoagulation.  Dressing to remain in place until he follows up with orthopedics in 2 weeks. Note: Dressing is waterproof and patient may shower.  Followup with Dr. Alvan Dame in 2 weeks.  Active problems: Postoperative anemia due to acute blood loss  No indication for transfusion.  Iron supplementation ordered.  Essential hypertension, benign  Not on home medications except for Cardura. We'll add Norvasc.  BPH (benign prostatic hyperplasia)  Continue Cardura.  DVT Prophylaxis  Aspirin twice a day x4 weeks.  Code Status: Full. Family Communication: No family at bedside. Disposition Plan: Home when stable.   IV Access:    Peripheral IV   Procedures:    Intramedullary nail 03/27/14 done by Dr. Alvan Dame.   Medical Consultants:    Dr. Paralee Cancel, Orthopedics.   Other Consultants:    None.   Anti-Infectives:    Physical therapy: SNF recommended.  Subjective:    MINNIE Prince has not moved his bowels for 3 days. Other than that, he feels well. No nausea or vomiting. Appetite is good. Some left-sided hip soreness and numbness of the skin.  Objective:    Filed Vitals:   03/28/14 0943 03/28/14 1352 03/28/14 2131 03/29/14 0647  BP: 158/63 164/73 155/79 112/62  Pulse: 94 61 58 64  Temp: 98.7 F (37.1 C) 98.3 F (36.8 C) 98.1 F (36.7 C) 98.1 F (36.7 C)  TempSrc: Oral Oral  Oral Oral  Resp: 16 16 16 16   Height:      Weight:      SpO2: 96% 95% 94% 93%    Intake/Output Summary (Last 24 hours) at 03/29/14 1254 Last data filed at 03/29/14 1240  Gross per 24 hour  Intake    720 ml  Output   2050 ml  Net  -1330 ml    Exam: Gen:  NAD Cardiovascular:  RRR, No M/R/G Respiratory:  Lungs CTAB Gastrointestinal:  Abdomen softly distended, + BS Extremities:  Left hip slightly swollen, dressings intact. No pedal edema   Data Reviewed:    Labs: Basic Metabolic Panel:  Recent Labs Lab 03/27/14 1305 03/28/14 0427 03/29/14 0358  NA 140 138 139  K 4.6 4.4 4.0  CL 104 102 102  CO2 26 26 29   GLUCOSE 91 209* 107*  BUN 20 14 15   CREATININE 0.83 0.78 0.89  CALCIUM 8.4 8.5 8.4   GFR Estimated Creatinine Clearance: 66 ml/min (by C-G formula based on Cr of 0.89).  Coagulation profile  Recent Labs Lab 03/27/14 1238  INR 1.00    CBC:  Recent Labs Lab 03/27/14 1238 03/28/14 0427 03/29/14 0358  WBC 6.6 6.8 9.6  NEUTROABS 4.6  --   --   HGB 11.8* 11.5* 10.1*  HCT 35.1* 35.6* 30.6*  MCV 89.3 92.5 91.1  PLT 261 161 158   Microbiology Recent Results (from the past 240 hour(s))  SURGICAL PCR SCREEN     Status: None   Collection Time  03/27/14  6:03 PM      Result Value Ref Range Status   MRSA, PCR NEGATIVE  NEGATIVE Final   Staphylococcus aureus NEGATIVE  NEGATIVE Final   Comment:            The Xpert SA Assay (FDA     approved for NASAL specimens     in patients over 29 years of age),     is one component of     a comprehensive surveillance     program.  Test performance has     been validated by [employer] for patients greater     than or equal to 26 year old.     It is not intended     to diagnose infection nor to     guide or monitor treatment.     Radiographs/Studies:   Dg Chest 1 View  03/27/2014   CLINICAL DATA:  Pain post trauma  EXAM: CHEST - 1 VIEW  COMPARISON:  August 14, 2011  FINDINGS: The lungs are clear.  The heart size and pulmonary vascularity are within normal limits. No adenopathy. No bone lesions.  IMPRESSION: No edema or consolidation.   Electronically Signed   By: Lowella Grip M.D.   On: 03/27/2014 13:07   Dg Hip Complete Left  03/27/2014   CLINICAL DATA:  Left hip pain status post fall  EXAM: LEFT HIP - COMPLETE 2+ VIEW  COMPARISON:  CT scan of the abdomen and pelvis dated January 18, 2009  FINDINGS: The bony pelvis is adequately mineralized. There is no acute fracture nor dislocation. The SI joints and sacrum exhibit no acute abnormality.  There is abnormal lucency in the intertrochanteric region on the left consistent with a nondisplaced fracture. The left hip joint space is preserved. The femoral head and neck are intact.  IMPRESSION: There is an acute nondisplaced fracture of the intertrochanteric region of the left hip.   Electronically Signed   By: David  Martinique   On: 03/27/2014 13:09   Dg Hip Operative Left  03/28/2014   CLINICAL DATA:  ORIF left hip.  EXAM: OPERATIVE LEFT HIP  COMPARISON:  None.  FINDINGS: Three intraoperative fluoroscopic spot films demonstrate placement of a left hip gamma nail with short femoral nail. Distal interlocking screw present.  IMPRESSION: Left hip gamma nail placement.   Electronically Signed   By: Dereck Ligas M.D.   On: 03/28/2014 02:51    Medications:   . aspirin EC  325 mg Oral BID  . calcium carbonate  1 tablet Oral BID WC  . cholecalciferol  1,000 Units Oral Daily  . docusate sodium  100 mg Oral BID  . doxazosin  2 mg Oral QHS  . ferrous sulfate  325 mg Oral TID PC  . loratadine  10 mg Oral Daily  . montelukast  10 mg Oral QHS  . multivitamin with minerals  1 tablet Oral Daily  . pantoprazole  40 mg Oral Daily  . pregabalin  50 mg Oral Daily  . rOPINIRole  1 mg Oral QHS   Continuous Infusions:   Time spent: 25 minutes.   LOS: 2 days   RAMA,CHRISTINA  [facility] Pager [phone]. If unable to reach me by pager, please call  my cell phone at [phone].  *Please refer to [email or web address], password [email or web address] to get updated schedule on who will round on this patient, as hospitalists switch teams weekly. If 7PM-7AM, please contact night-coverage at [email or web address], password [facility] for any  overnight needs.  03/29/2014, 12:54 PM    **Disclaimer: This note was dictated with voice recognition software. Similar sounding words can inadvertently be transcribed and this note may contain transcription errors which may not have been corrected upon publication of note.**

## 2014-03-29 NOTE — Progress Notes (Signed)
Physical Therapy Treatment Patient Details Name: [name] MRN: [id number] DOB: 12-May-1935 Today's Date: Prince/06/24    History of Present Illness Pt is a 79 year old male s/p ORIF of left intertrochanteric femur fracture.    PT Comments    Pt able to tolerate increase in ambulation and performed exercises in recliner.  Pt reports d/c to SNF tomorrow.  Follow Up Recommendations  SNF     Equipment Recommendations  None recommended by PT    Recommendations for Other Services       Precautions / Restrictions Precautions Precautions: Fall Restrictions Other Position/Activity Restrictions: WBAT    Mobility  Bed Mobility Overal bed mobility: Needs Assistance Bed Mobility: Supine to Sit     Supine to sit: Min assist;HOB elevated     General bed mobility comments: verbal cues for technique, assist for L LE  Transfers Overall transfer level: Needs assistance Equipment used: Rolling walker (2 wheeled) Transfers: Sit to/from Stand Sit to Stand: Min guard         General transfer comment: verbal cues for safe technique including UE and LE placement  Ambulation/Gait Ambulation/Gait assistance: Min guard Ambulation Distance (Feet): 60 Feet Assistive device: Rolling walker (2 wheeled) Gait Pattern/deviations: Step-to pattern;Antalgic Gait velocity: decr   General Gait Details: verbal cues for sequence, technique, RW distance, step length; increased time however pt more steady today without any buckling   Stairs            Wheelchair Mobility    Modified Rankin (Stroke Patients Only)       Balance                                    Cognition Arousal/Alertness: Awake/alert Behavior During Therapy: WFL for tasks assessed/performed Overall Cognitive Status: Within Functional Limits for tasks assessed                      Exercises General Exercises - Lower Extremity Ankle Circles/Pumps: AROM;Both;10 reps Quad Sets:  AROM;Both;10 reps Gluteal Sets: AROM;Both;10 reps Short Arc QuadSinclair Prince;Left;15 reps Heel Slides: AAROM;Left;15 reps Hip ABduction/ADduction: AAROM;Left;15 reps    General Comments        Pertinent Vitals/Pain Pt reports pain under control only c/o sore L hip, activity to tolerance, repositioned    Home Living                      Prior Function            PT Goals (current goals can now be found in the care plan section) Progress towards PT goals: Progressing toward goals    Frequency  7X/week    PT Plan Current plan remains appropriate    Co-evaluation             End of Session Equipment Utilized During Treatment: Gait belt Activity Tolerance: Patient tolerated treatment well Patient left: in chair;with family/visitor present;with call bell/phone within reach     Time: 1117-1145 PT Time Calculation (min): 28 min  Charges:  $Gait Training: 8-22 mins $Therapeutic Exercise: 8-22 mins                    G Codes:      [name],[name], 1:00 PM [name] 04/05/14 Pager: [phone]

## 2014-03-29 NOTE — Progress Notes (Signed)
Physical Therapy Treatment Note   03/29/14 1400  PT Visit Information  Last PT Received On 03/29/14  Assistance Needed +1  History of Present Illness Pt is a 79 year old male s/p ORIF of left intertrochanteric femur fracture.  PT Time Calculation  PT Start Time 1420  PT Stop Time 1433  PT Time Calculation (min) 13 min  Subjective Data  Subjective Pt wished to ambulate again so returned to continue gait training as pt very motivated.  Precautions  Precautions Fall  Restrictions  Other Position/Activity Restrictions WBAT  Cognition  Arousal/Alertness Awake/alert  Behavior During Therapy WFL for tasks assessed/performed  Overall Cognitive Status Within Functional Limits for tasks assessed  Transfers  Overall transfer level Needs assistance  Equipment used Rolling walker (2 wheeled)  Transfers Sit to/from Stand  Sit to Stand Min guard  General transfer comment verbal cues for safe technique including UE and LE placement  Ambulation/Gait  Ambulation/Gait assistance Min guard  Ambulation Distance (Feet) 80 Feet  Assistive device Rolling walker (2 wheeled)  Gait Pattern/deviations Step-to pattern;Antalgic  Gait velocity decr  General Gait Details verbal cues for sequence, technique, RW distance, step length; increased time; no buckling with step to pattern however did attempt step through today but pt with immediate buckling so continued with step to pattern  PT - End of Session  Activity Tolerance Patient tolerated treatment well  Patient left in chair;with call bell/phone within reach  PT - Assessment/Plan  PT Plan Current plan remains appropriate  PT Frequency 7X/week  Follow Up Recommendations SNF  PT equipment None recommended by PT  PT Goal Progression  Progress towards PT goals Progressing toward goals  PT General Charges  $$ ACUTE PT VISIT 1 Procedure  PT Treatments  $Gait Training 8-22 mins   Carmelia Bake, PT, DPT 03/29/2014 Pager: [phone]

## 2014-03-30 DIAGNOSIS — M1990 Unspecified osteoarthritis, unspecified site: Secondary | ICD-10-CM | POA: Diagnosis not present

## 2014-03-30 DIAGNOSIS — G4700 Insomnia, unspecified: Secondary | ICD-10-CM | POA: Diagnosis not present

## 2014-03-30 DIAGNOSIS — D62 Acute posthemorrhagic anemia: Secondary | ICD-10-CM | POA: Diagnosis not present

## 2014-03-30 DIAGNOSIS — Z5189 Encounter for other specified aftercare: Secondary | ICD-10-CM | POA: Diagnosis not present

## 2014-03-30 DIAGNOSIS — M6281 Muscle weakness (generalized): Secondary | ICD-10-CM | POA: Diagnosis not present

## 2014-03-30 DIAGNOSIS — I10 Essential (primary) hypertension: Secondary | ICD-10-CM | POA: Diagnosis not present

## 2014-03-30 DIAGNOSIS — N400 Enlarged prostate without lower urinary tract symptoms: Secondary | ICD-10-CM | POA: Diagnosis not present

## 2014-03-30 DIAGNOSIS — Z9109 Other allergy status, other than to drugs and biological substances: Secondary | ICD-10-CM | POA: Diagnosis not present

## 2014-03-30 DIAGNOSIS — S7290XA Unspecified fracture of unspecified femur, initial encounter for closed fracture: Secondary | ICD-10-CM | POA: Diagnosis not present

## 2014-03-30 DIAGNOSIS — M25669 Stiffness of unspecified knee, not elsewhere classified: Secondary | ICD-10-CM | POA: Diagnosis not present

## 2014-03-30 DIAGNOSIS — IMO0001 Reserved for inherently not codable concepts without codable children: Secondary | ICD-10-CM | POA: Diagnosis not present

## 2014-03-30 DIAGNOSIS — S79919A Unspecified injury of unspecified hip, initial encounter: Secondary | ICD-10-CM | POA: Diagnosis not present

## 2014-03-30 DIAGNOSIS — M25559 Pain in unspecified hip: Secondary | ICD-10-CM | POA: Diagnosis not present

## 2014-03-30 DIAGNOSIS — D649 Anemia, unspecified: Secondary | ICD-10-CM | POA: Diagnosis not present

## 2014-03-30 DIAGNOSIS — Z9181 History of falling: Secondary | ICD-10-CM | POA: Diagnosis not present

## 2014-03-30 DIAGNOSIS — K5900 Constipation, unspecified: Secondary | ICD-10-CM | POA: Diagnosis not present

## 2014-03-30 DIAGNOSIS — J449 Chronic obstructive pulmonary disease, unspecified: Secondary | ICD-10-CM | POA: Diagnosis not present

## 2014-03-30 DIAGNOSIS — R269 Unspecified abnormalities of gait and mobility: Secondary | ICD-10-CM | POA: Diagnosis not present

## 2014-03-30 DIAGNOSIS — G2581 Restless legs syndrome: Secondary | ICD-10-CM | POA: Diagnosis not present

## 2014-03-30 DIAGNOSIS — K589 Irritable bowel syndrome without diarrhea: Secondary | ICD-10-CM | POA: Diagnosis not present

## 2014-03-30 DIAGNOSIS — K219 Gastro-esophageal reflux disease without esophagitis: Secondary | ICD-10-CM | POA: Diagnosis not present

## 2014-03-30 DIAGNOSIS — S79929A Unspecified injury of unspecified thigh, initial encounter: Secondary | ICD-10-CM | POA: Diagnosis not present

## 2014-03-30 DIAGNOSIS — S72143A Displaced intertrochanteric fracture of unspecified femur, initial encounter for closed fracture: Secondary | ICD-10-CM | POA: Diagnosis not present

## 2014-03-30 DIAGNOSIS — IMO0002 Reserved for concepts with insufficient information to code with codable children: Secondary | ICD-10-CM | POA: Diagnosis not present

## 2014-03-30 LAB — CBC
HCT: 32.3 % — ABNORMAL LOW (ref 39.0–52.0)
Hemoglobin: 10.4 g/dL — ABNORMAL LOW (ref 13.0–17.0)
MCH: 29.8 pg (ref 26.0–34.0)
MCHC: 32.2 g/dL (ref 30.0–36.0)
MCV: 92.6 fL (ref 78.0–100.0)
Platelets: 156 10*3/uL (ref 150–400)
RBC: 3.49 MIL/uL — AB (ref 4.22–5.81)
RDW: 14.7 % (ref 11.5–15.5)
WBC: 6.4 10*3/uL (ref 4.0–10.5)

## 2014-03-30 LAB — BASIC METABOLIC PANEL
BUN: 14 mg/dL (ref 6–23)
CALCIUM: 8.7 mg/dL (ref 8.4–10.5)
CO2: 27 meq/L (ref 19–32)
CREATININE: 0.78 mg/dL (ref 0.50–1.35)
Chloride: 104 mEq/L (ref 96–112)
GFR calc Af Amer: >90 mL/min (ref >90)
GFR, EST NON AFRICAN AMERICAN: 85 mL/min — AB (ref >90)
Glucose, Bld: 85 mg/dL (ref 70–99)
Potassium: 4.2 mEq/L (ref 3.7–5.3)
SODIUM: 141 meq/L (ref 137–147)

## 2014-03-30 MED ORDER — ZOLPIDEM TARTRATE 5 MG PO TABS: 5.0000 mg | ORAL_TABLET | Freq: Every evening | ORAL | Status: DC | PRN

## 2014-03-30 MED ORDER — POLYETHYLENE GLYCOL 3350 17 G PO PACK: 17.0000 g | PACK | Freq: Every day | ORAL | Status: DC | PRN

## 2014-03-30 MED ORDER — FERROUS SULFATE 325 (65 FE) MG PO TABS: 325.0000 mg | ORAL_TABLET | Freq: Three times a day (TID) | ORAL | Status: DC

## 2014-03-30 MED ORDER — DSS 100 MG PO CAPS: 100.0000 mg | ORAL_CAPSULE | Freq: Two times a day (BID) | ORAL | Status: DC

## 2014-03-30 MED ORDER — AMLODIPINE BESYLATE 5 MG PO TABS: 5.0000 mg | ORAL_TABLET | Freq: Every day | ORAL | Status: DC

## 2014-03-30 NOTE — Discharge Instructions (Signed)
Weight bearing as tolerated left lower extremity  Keep wounds dry until follow Femur Fracture A femur fracture is a complete or incomplete break in the thighbone (femur). This is a serious injury, but is uncommon in sports. Usually the ankle, lower leg, or knee will become injured before the thighbone does.  SYMPTOMS   Severe pain in the thigh, at the time of injury.  Tenderness and inflammation in the thigh.  Bleeding and bruising in the thigh.  Inability to bear weight on the injured leg.  Visible deformity, if the fracture is complete and bone fragments separate enough to distort the leg shape.  Numbness and coldness in the leg and foot, beyond the fracture site, if blood supply is impaired. CAUSES   A fracture results when the force applied to a bone is greater that the bone can withstand. Thighbone fractures often result from a direct hit (trauma).  Indirect stress, caused by twisting or violent muscle contraction. RISK INCREASES WITH:   Contact sports (i.e. football, soccer, hockey), motor sports, and track and field events.  Previous or current bone problems (i.e. osteoporosis, tumors).  Metabolism disorders or hormone problems.  Nutrition deficiency or disorder (i.e. anorexia and bulimia).  Poor strength and flexibility. PREVENTION   Warm up and stretch properly before activity.  Maintain physical fitness:  Muscle strength.  Endurance and flexibility.  Cardiovascular fitness.  Wear proper protective equipment (i.e. thigh pads for football or hockey). PROGNOSIS  This condition can often be cured with proper treatment, though it may take 6 to 8 weeks to heal.  RELATED COMPLICATIONS   Low blood volume (hypovolemic) shock, due to blood loss in the thigh.  Failure of bone to heal (nonunion).  Bone heals in a poor position (malunion).  Increased pressure inside the leg(compartment syndrome), due to injury that disrupts blood supply to the leg and foot and  injures the nerves and muscles of the leg and foot (uncommon).  Shortening of the injured bones.  Increased chance of repeated leg injury.  Stiff hip or knee.  Hindrance of normal bone growth in children.  Risks of surgery: infection, bleeding, injury to nerves (numbness, weakness, paralysis), need for further surgery.  Infection of open fractures (skin broken over fracture site).  Bone forming within the muscle (myositis ossificans).  Longer healing time, if activity is resumed too soon. TREATMENT  Treatment first involves the use of ice and medicine to reduce pain and inflammation. Treatment of thighbone fractures often requires surgery, to allow the bone to heal in proper alignment, and to reduce the risk of possible complications. Surgery often involves placing a metal rod down the center of the bone, or fixing plates and screws over the fracture line. Use of a cast is not common, because the cast would need to involve the stomach, low back, pelvis, and extend to the foot. For adults, traction (applying pressure using a device) is not often advised, due to the need for prolonged bed rest (6 to 8 weeks). In certain cases, bone growth stimulators may be advised. After the bone heals (with or without surgery), stretching and strengthening exercise is needed. Exercises may be done at home or with a therapist. The rod, plate, and screws from surgery are only removed if they cause further discomfort.  MEDICATION   If pain medicine is needed, nonsteroidal anti-inflammatory medicines (aspirin and ibuprofen), or other minor pain relievers (acetaminophen), are often advised.  Do not take pain medicine for 7 days before surgery.  Stronger pain relievers may  be prescribed by your caregiver. Use only as directed and only as much as you need. SEEK MEDICAL CARE IF:   Symptoms get worse or do not improve in 2 weeks, despite treatment.  The following occur after restraint or surgery. (Report any of  these signs immediately):  Swelling above or below the fracture site.  Severe, persistent pain.  Blue or gray skin below the fracture site, especially under the toenails. Numbness or loss of feeling below the fracture site.  New, unexplained symptoms develop. (Drugs used in treatment may produce side effects.) Document Released: 09/29/2005 Document Revised: 12/22/2011 Document Reviewed: 01/11/2009 [facility] Patient Information 2015 [place], [place]. This information is not intended to replace advice given to you by your health care provider. Make sure you discuss any questions you have with your health care provider.

## 2014-03-30 NOTE — Progress Notes (Signed)
Attempted to call report no answer when transferred  [place]

## 2014-03-30 NOTE — Discharge Summary (Signed)
Physician Discharge Summary  [name] TDD:[id number] DOB: [birth year] DOA: 03/27/2014  PCP: Purvis Kilts, MD  Admit date: 03/27/2014 Discharge date: 03/30/2014   Recommendations for Outpatient Follow-Up:   1. The patient is being discharged to a SNF for rehabilitation. 2. Recommend BP check in 4 weeks.   Discharge Diagnosis:   Principle problem:    Fracture, intertrochanteric, left femur Active problems:    Essential hypertension, benign    BPH (benign prostatic hyperplasia)    Postoperative anemia due to acute blood loss   Discharge Condition: Improved.  Diet recommendation: Low sodium, heart healthy.     History of Present Illness:   [name] is an 79 y.o. male with a PMH of IBS, GERD, osteoarthritis, prostatomegaly was admitted 03/27/14 after suffering from a mechanical fall. Upon initial evaluation in the ER, he was found to have a left hip intertrochanteric fracture.  Hospital Course by Problem:   Active Problems:  Fracture, intertrochanteric, left femur  Status post intramedullary nail per Dr. Alvan Dame.  Weightbearing as tolerated.  Aspirin 325 mg twice a day x4 weeks for anticoagulation.  Dressing to remain in place until he follows up with orthopedics in 2 weeks. Note: Dressing is waterproof and patient may shower.  Followup with Dr. Alvan Dame in 2 weeks.  Active problems:  Postoperative anemia due to acute blood loss  No indication for transfusion.  Iron supplementation ordered.  Essential hypertension, benign  Not on home medications except for Cardura. Norvasc started 03/29/14.  BPH (benign prostatic hyperplasia)  Continue Cardura.  DVT Prophylaxis  Aspirin twice a day x4 weeks.    Procedures:    Intramedullary nail 03/27/14 done by Dr. Alvan Dame.   Medical Consultants:    Dr. Paralee Cancel, Orthopedics.   Discharge Exam:   Filed Vitals:   03/30/14 0511  BP: 154/75  Pulse: 65  Temp: 98 F (36.7 C)  Resp: 16   Filed  Vitals:   03/29/14 2030 03/29/14 2135 03/30/14 0505 03/30/14 0511  BP:  141/61  154/75  Pulse:  69  65  Temp:  98.2 F (36.8 C)  98 F (36.7 C)  TempSrc:  Oral  Oral  Resp: 16 16 16 16   Height:      Weight:      SpO2:  95%  94%    Gen:  NAD Cardiovascular:  RRR, No M/R/G Respiratory: Lungs CTAB Gastrointestinal: Abdomen soft, NT/ND with normal active bowel sounds. Extremities: No pedal edema, left hip slightly swollen, dressings intact.    Discharge Instructions:   Discharge Instructions   Call MD for:  extreme fatigue    Complete by:  As directed      Call MD for:  persistant dizziness or light-headedness    Complete by:  As directed      Call MD for:  redness, tenderness, or signs of infection (pain, swelling, redness, odor or green/yellow discharge around incision site)    Complete by:  As directed      Call MD for:  severe uncontrolled pain    Complete by:  As directed      Call MD for:  temperature >100.4    Complete by:  As directed      Diet - low sodium heart healthy    Complete by:  As directed      Discharge wound care:    Complete by:  As directed   Leave dressing intact until you see your orthopedic MD in follow up.  May shower (  dressing is waterproof).     Increase activity slowly    Complete by:  As directed      Walk with assistance    Complete by:  As directed      Walker     Complete by:  As directed      Weight bearing as tolerated    Complete by:  As directed             Medication List         amLODipine 5 MG tablet  Commonly known as:  NORVASC  Take 1 tablet (5 mg total) by mouth daily.     aspirin EC 325 MG tablet  Take 1 tablet (325 mg total) by mouth 2 (two) times daily. Take for 4 weeks     calcium carbonate 200 MG capsule  Take 250 mg by mouth 2 (two) times daily with a meal.     cholecalciferol 1000 UNITS tablet  Commonly known as:  VITAMIN D  Take 1,000 Units by mouth daily.     doxazosin 4 MG tablet  Commonly known as:   CARDURA  Take 2 mg by mouth at bedtime.     DSS 100 MG Caps  Take 100 mg by mouth 2 (two) times daily.     ferrous sulfate 325 (65 FE) MG tablet  Take 1 tablet (325 mg total) by mouth 3 (three) times daily after meals.     fexofenadine 180 MG tablet  Commonly known as:  ALLEGRA  Take 180 mg by mouth daily.     HYDROcodone-acetaminophen 5-325 MG per tablet  Commonly known as:  NORCO  Take 1-2 tablets by mouth every 6 (six) hours as needed.     LYRICA 50 MG capsule  Generic drug:  pregabalin  Take 50 mg by mouth daily.     meloxicam 15 MG tablet  Commonly known as:  MOBIC  Take 15 mg by mouth daily.     montelukast 10 MG tablet  Commonly known as:  SINGULAIR  Take 10 mg by mouth at bedtime.     multivitamin tablet  Take 1 tablet by mouth daily.     omeprazole 40 MG capsule  Commonly known as:  PRILOSEC  Take 40 mg by mouth 2 (two) times daily.     polyethylene glycol packet  Commonly known as:  MIRALAX / GLYCOLAX  Take 17 g by mouth daily as needed for mild constipation.     rOPINIRole 1 MG tablet  Commonly known as:  REQUIP  Take 1 mg by mouth at bedtime.     tiZANidine 4 MG tablet  Commonly known as:  ZANAFLEX  Take 2-4 mg by mouth at bedtime as needed. For pain; 1 -2 tablets depending on pain level     zolpidem 5 MG tablet  Commonly known as:  AMBIEN  Take 1 tablet (5 mg total) by mouth at bedtime as needed. For sleep           Follow-up Information   Follow up with Mauri Pole, MD In 2 weeks.   Specialty:  Orthopedic Surgery   Contact information:   [address] [place] [address] [phone]       Follow up with Purvis Kilts, MD. Schedule an appointment as soon as possible for a visit in 4 weeks. (Follow up blood pressure check.)    Specialty:  Family Medicine   Contact information:   [address] [place] [address] [phone]  The results of significant diagnostics from this  hospitalization (including imaging, microbiology, ancillary and laboratory) are listed below for reference.     Significant Diagnostic Studies:   Radiographs: Dg Chest 1 View  03/27/2014   CLINICAL DATA:  Pain post trauma  EXAM: CHEST - 1 VIEW  COMPARISON:  August 14, 2011  FINDINGS: The lungs are clear. The heart size and pulmonary vascularity are within normal limits. No adenopathy. No bone lesions.  IMPRESSION: No edema or consolidation.   Electronically Signed   By: Lowella Grip M.D.   On: 03/27/2014 13:07   Dg Hip Complete Left  03/27/2014   CLINICAL DATA:  Left hip pain status post fall  EXAM: LEFT HIP - COMPLETE 2+ VIEW  COMPARISON:  CT scan of the abdomen and pelvis dated January 18, 2009  FINDINGS: The bony pelvis is adequately mineralized. There is no acute fracture nor dislocation. The SI joints and sacrum exhibit no acute abnormality.  There is abnormal lucency in the intertrochanteric region on the left consistent with a nondisplaced fracture. The left hip joint space is preserved. The femoral head and neck are intact.  IMPRESSION: There is an acute nondisplaced fracture of the intertrochanteric region of the left hip.   Electronically Signed   By: David  Martinique   On: 03/27/2014 13:09   Dg Hip Operative Left  03/28/2014   CLINICAL DATA:  ORIF left hip.  EXAM: OPERATIVE LEFT HIP  COMPARISON:  None.  FINDINGS: Three intraoperative fluoroscopic spot films demonstrate placement of a left hip gamma nail with short femoral nail. Distal interlocking screw present.  IMPRESSION: Left hip gamma nail placement.   Electronically Signed   By: Dereck Ligas M.D.   On: 03/28/2014 02:51    Labs:  Basic Metabolic Panel:  Recent Labs Lab 03/27/14 1305 03/28/14 0427 03/29/14 0358 03/30/14 0500  NA 140 138 139 141  K 4.6 4.4 4.0 4.2  CL 104 102 102 104  CO2 26 26 29 27   GLUCOSE 91 209* 107* 85  BUN 20 14 15 14   CREATININE 0.83 0.78 0.89 0.78  CALCIUM 8.4 8.5 8.4 8.7   GFR Estimated  Creatinine Clearance: 73.4 ml/min (by C-G formula based on Cr of 0.78).  Coagulation profile  Recent Labs Lab 03/27/14 1238  INR 1.00    CBC:  Recent Labs Lab 03/27/14 1238 03/28/14 0427 03/29/14 0358 03/30/14 0500  WBC 6.6 6.8 9.6 6.4  NEUTROABS 4.6  --   --   --   HGB 11.8* 11.5* 10.1* 10.4*  HCT 35.1* 35.6* 30.6* 32.3*  MCV 89.3 92.5 91.1 92.6  PLT 261 161 158 156   Microbiology Recent Results (from the past 240 hour(s))  SURGICAL PCR SCREEN     Status: None   Collection Time    03/27/14  6:03 PM      Result Value Ref Range Status   MRSA, PCR NEGATIVE  NEGATIVE Final   Staphylococcus aureus NEGATIVE  NEGATIVE Final   Comment:            The Xpert SA Assay (FDA     approved for NASAL specimens     in patients over 64 years of age),     is one component of     a comprehensive surveillance     program.  Test performance has     been validated by [employer] for patients greater     than or equal to 87 year old.  It is not intended     to diagnose infection nor to     guide or monitor treatment.    Time coordinating discharge: 35 minutes.  Signed:  RAMA,CHRISTINA  Pager [phone] [facility] 03/30/2014, 11:04 AM

## 2014-03-30 NOTE — Progress Notes (Signed)
Clinical Social Work Department CLINICAL SOCIAL WORK PLACEMENT NOTE 03/30/2014  Patient:  [name], [name]  Account Number:  [id number] Admit date:  03/27/2014  Clinical Social Worker:  Werner Lean, LCSW  Date/time:  03/28/2014 01:31 PM  Clinical Social Work is seeking post-discharge placement for this patient at the following level of care:   SKILLED NURSING   (*CSW will update this form in Epic as items are completed)   03/28/2014  Patient/family provided with [facility] Department of Clinical Social Work's list of facilities offering this level of care within the geographic area requested by the patient (or if unable, by the patient's family).  03/28/2014  Patient/family informed of their freedom to choose among providers that offer the needed level of care, that participate in Medicare, Medicaid or managed care program needed by the patient, have an available bed and are willing to accept the patient.  03/28/2014  Patient/family informed of [facility]' ownership interest in [facility], as well as of the fact that they are under no obligation to receive care at this facility.  PASARR submitted to EDS on 03/28/2014 PASARR number received on 03/28/2014  FL2 transmitted to all facilities in geographic area requested by pt/family on  03/28/2014 FL2 transmitted to all facilities within larger geographic area on   Patient informed that his/her managed care company has contracts with or will negotiate with  certain facilities, including the following:     Patient/family informed of bed offers received:  03/28/2014 Patient chooses bed at [facility] SN[facility] Physician recommends and patient chooses bed at    Patient to be transferred to [facility] SNF on  03/30/2014 Patient to be transferred to facility by [facility] Patient and family notified of transfer on 03/30/2014 Name of family member notified:  spouse  The following physician request were entered in  Epic:   Additional Comments: Pt / spouse are in agreement with d/c to SNF today via [facility] transport. Nsg reviewed d/c summary, avs, scripts. Scripts were included in d/c packet.  Werner Lean LCSW [phone]

## 2014-03-30 NOTE — Progress Notes (Signed)
Physical Therapy Treatment Patient Details Name: [name] MRN: [id number] DOB: July 01, 1935 Today's Date: 2014-04-10    History of Present Illness Pt is a 79 year old male s/p ORIF of left intertrochanteric femur fracture.    PT Comments    Pt progressing well and ambulated in hallway then performed exercises in recliner.  Pt plans to d/c to SNF today or tomorrow.  Follow Up Recommendations  SNF     Equipment Recommendations  None recommended by PT    Recommendations for Other Services       Precautions / Restrictions Precautions Precautions: Fall Restrictions Other Position/Activity Restrictions: WBAT    Mobility  Bed Mobility Overal bed mobility: Needs Assistance Bed Mobility: Supine to Sit     Supine to sit: HOB elevated;Supervision     General bed mobility comments: verbal cues for self assist  Transfers Overall transfer level: Needs assistance Equipment used: Rolling walker (2 wheeled) Transfers: Sit to/from Stand Sit to Stand: Min guard         General transfer comment: verbal cues for hand placement  Ambulation/Gait Ambulation/Gait assistance: Min guard Ambulation Distance (Feet): 100 Feet Assistive device: Rolling walker (2 wheeled) Gait Pattern/deviations: Step-to pattern;Antalgic Gait velocity: decr   General Gait Details: verbal cues for RW distance, step length   Stairs            Wheelchair Mobility    Modified Rankin (Stroke Patients Only)       Balance                                    Cognition Arousal/Alertness: Awake/alert Behavior During Therapy: WFL for tasks assessed/performed Overall Cognitive Status: Within Functional Limits for tasks assessed                      Exercises General Exercises - Lower Extremity Ankle Circles/Pumps: AROM;Both;15 reps Quad Sets: AROM;Both;15 reps Short Arc QuadSinclair Ship;Left;15 reps Long Arc Quad: AROM;Left;15 reps Heel Slides: AAROM;Left;15  reps Hip ABduction/ADduction: AAROM;Left;15 reps Straight Leg Raises: AAROM;Left;10 reps    General Comments        Pertinent Vitals/Pain Pt reports good pain control today, 3/10 L hip, activity to tolerance, repositioned    Home Living                      Prior Function            PT Goals (current goals can now be found in the care plan section) Progress towards PT goals: Progressing toward goals    Frequency  7X/week    PT Plan Current plan remains appropriate    Co-evaluation             End of Session   Activity Tolerance: Patient tolerated treatment well Patient left: in chair;with call bell/phone within reach     Time: 1006-1032 PT Time Calculation (min): 26 min  Charges:  $Gait Training: 8-22 mins $Therapeutic Exercise: 8-22 mins                    G Codes:      Airyanna Dipalma,KATHrine E 2014-04-10, 11:58 AM Carmelia Bake, PT, DPT 04/10/2014 Pager: [phone]

## 2014-04-09 DIAGNOSIS — M159 Polyosteoarthritis, unspecified: Secondary | ICD-10-CM | POA: Diagnosis not present

## 2014-04-09 DIAGNOSIS — K900 Celiac disease: Secondary | ICD-10-CM | POA: Diagnosis not present

## 2014-04-09 DIAGNOSIS — IMO0001 Reserved for inherently not codable concepts without codable children: Secondary | ICD-10-CM | POA: Diagnosis not present

## 2014-04-09 DIAGNOSIS — I10 Essential (primary) hypertension: Secondary | ICD-10-CM | POA: Diagnosis not present

## 2014-04-09 DIAGNOSIS — M6281 Muscle weakness (generalized): Secondary | ICD-10-CM | POA: Diagnosis not present

## 2014-04-11 DIAGNOSIS — S72009D Fracture of unspecified part of neck of unspecified femur, subsequent encounter for closed fracture with routine healing: Secondary | ICD-10-CM | POA: Diagnosis not present

## 2014-04-11 DIAGNOSIS — Z4789 Encounter for other orthopedic aftercare: Secondary | ICD-10-CM | POA: Diagnosis not present

## 2014-04-12 DIAGNOSIS — M159 Polyosteoarthritis, unspecified: Secondary | ICD-10-CM | POA: Diagnosis not present

## 2014-04-12 DIAGNOSIS — K900 Celiac disease: Secondary | ICD-10-CM | POA: Diagnosis not present

## 2014-04-12 DIAGNOSIS — M6281 Muscle weakness (generalized): Secondary | ICD-10-CM | POA: Diagnosis not present

## 2014-04-12 DIAGNOSIS — IMO0001 Reserved for inherently not codable concepts without codable children: Secondary | ICD-10-CM | POA: Diagnosis not present

## 2014-04-12 DIAGNOSIS — I10 Essential (primary) hypertension: Secondary | ICD-10-CM | POA: Diagnosis not present

## 2014-04-13 DIAGNOSIS — S72009S Fracture of unspecified part of neck of unspecified femur, sequela: Secondary | ICD-10-CM | POA: Diagnosis not present

## 2014-04-13 DIAGNOSIS — K5909 Other constipation: Secondary | ICD-10-CM | POA: Diagnosis not present

## 2014-04-13 DIAGNOSIS — IMO0002 Reserved for concepts with insufficient information to code with codable children: Secondary | ICD-10-CM | POA: Diagnosis not present

## 2014-04-13 DIAGNOSIS — Z23 Encounter for immunization: Secondary | ICD-10-CM | POA: Diagnosis not present

## 2014-04-13 DIAGNOSIS — R030 Elevated blood-pressure reading, without diagnosis of hypertension: Secondary | ICD-10-CM | POA: Diagnosis not present

## 2014-04-14 DIAGNOSIS — M6281 Muscle weakness (generalized): Secondary | ICD-10-CM | POA: Diagnosis not present

## 2014-04-14 DIAGNOSIS — I10 Essential (primary) hypertension: Secondary | ICD-10-CM | POA: Diagnosis not present

## 2014-04-14 DIAGNOSIS — K900 Celiac disease: Secondary | ICD-10-CM | POA: Diagnosis not present

## 2014-04-14 DIAGNOSIS — IMO0001 Reserved for inherently not codable concepts without codable children: Secondary | ICD-10-CM | POA: Diagnosis not present

## 2014-04-14 DIAGNOSIS — M159 Polyosteoarthritis, unspecified: Secondary | ICD-10-CM | POA: Diagnosis not present

## 2014-04-17 DIAGNOSIS — K900 Celiac disease: Secondary | ICD-10-CM | POA: Diagnosis not present

## 2014-04-17 DIAGNOSIS — I10 Essential (primary) hypertension: Secondary | ICD-10-CM | POA: Diagnosis not present

## 2014-04-17 DIAGNOSIS — M159 Polyosteoarthritis, unspecified: Secondary | ICD-10-CM | POA: Diagnosis not present

## 2014-04-17 DIAGNOSIS — IMO0001 Reserved for inherently not codable concepts without codable children: Secondary | ICD-10-CM | POA: Diagnosis not present

## 2014-04-17 DIAGNOSIS — M6281 Muscle weakness (generalized): Secondary | ICD-10-CM | POA: Diagnosis not present

## 2014-04-19 DIAGNOSIS — M6281 Muscle weakness (generalized): Secondary | ICD-10-CM | POA: Diagnosis not present

## 2014-04-19 DIAGNOSIS — IMO0001 Reserved for inherently not codable concepts without codable children: Secondary | ICD-10-CM | POA: Diagnosis not present

## 2014-04-19 DIAGNOSIS — M159 Polyosteoarthritis, unspecified: Secondary | ICD-10-CM | POA: Diagnosis not present

## 2014-04-19 DIAGNOSIS — I10 Essential (primary) hypertension: Secondary | ICD-10-CM | POA: Diagnosis not present

## 2014-04-19 DIAGNOSIS — K900 Celiac disease: Secondary | ICD-10-CM | POA: Diagnosis not present

## 2014-04-24 DIAGNOSIS — M159 Polyosteoarthritis, unspecified: Secondary | ICD-10-CM | POA: Diagnosis not present

## 2014-04-24 DIAGNOSIS — I10 Essential (primary) hypertension: Secondary | ICD-10-CM | POA: Diagnosis not present

## 2014-04-24 DIAGNOSIS — IMO0001 Reserved for inherently not codable concepts without codable children: Secondary | ICD-10-CM | POA: Diagnosis not present

## 2014-04-24 DIAGNOSIS — M6281 Muscle weakness (generalized): Secondary | ICD-10-CM | POA: Diagnosis not present

## 2014-04-24 DIAGNOSIS — K900 Celiac disease: Secondary | ICD-10-CM | POA: Diagnosis not present

## 2014-04-26 DIAGNOSIS — M6281 Muscle weakness (generalized): Secondary | ICD-10-CM | POA: Diagnosis not present

## 2014-04-26 DIAGNOSIS — K900 Celiac disease: Secondary | ICD-10-CM | POA: Diagnosis not present

## 2014-04-26 DIAGNOSIS — I10 Essential (primary) hypertension: Secondary | ICD-10-CM | POA: Diagnosis not present

## 2014-04-26 DIAGNOSIS — M159 Polyosteoarthritis, unspecified: Secondary | ICD-10-CM | POA: Diagnosis not present

## 2014-04-26 DIAGNOSIS — IMO0001 Reserved for inherently not codable concepts without codable children: Secondary | ICD-10-CM | POA: Diagnosis not present

## 2014-04-28 DIAGNOSIS — IMO0001 Reserved for inherently not codable concepts without codable children: Secondary | ICD-10-CM | POA: Diagnosis not present

## 2014-04-28 DIAGNOSIS — I10 Essential (primary) hypertension: Secondary | ICD-10-CM | POA: Diagnosis not present

## 2014-04-28 DIAGNOSIS — M6281 Muscle weakness (generalized): Secondary | ICD-10-CM | POA: Diagnosis not present

## 2014-04-28 DIAGNOSIS — K900 Celiac disease: Secondary | ICD-10-CM | POA: Diagnosis not present

## 2014-04-28 DIAGNOSIS — M159 Polyosteoarthritis, unspecified: Secondary | ICD-10-CM | POA: Diagnosis not present

## 2014-05-01 DIAGNOSIS — M6281 Muscle weakness (generalized): Secondary | ICD-10-CM | POA: Diagnosis not present

## 2014-05-01 DIAGNOSIS — M159 Polyosteoarthritis, unspecified: Secondary | ICD-10-CM | POA: Diagnosis not present

## 2014-05-01 DIAGNOSIS — IMO0001 Reserved for inherently not codable concepts without codable children: Secondary | ICD-10-CM | POA: Diagnosis not present

## 2014-05-01 DIAGNOSIS — I10 Essential (primary) hypertension: Secondary | ICD-10-CM | POA: Diagnosis not present

## 2014-05-01 DIAGNOSIS — K900 Celiac disease: Secondary | ICD-10-CM | POA: Diagnosis not present

## 2014-05-03 DIAGNOSIS — I10 Essential (primary) hypertension: Secondary | ICD-10-CM | POA: Diagnosis not present

## 2014-05-03 DIAGNOSIS — IMO0001 Reserved for inherently not codable concepts without codable children: Secondary | ICD-10-CM | POA: Diagnosis not present

## 2014-05-03 DIAGNOSIS — M6281 Muscle weakness (generalized): Secondary | ICD-10-CM | POA: Diagnosis not present

## 2014-05-03 DIAGNOSIS — M159 Polyosteoarthritis, unspecified: Secondary | ICD-10-CM | POA: Diagnosis not present

## 2014-05-03 DIAGNOSIS — K900 Celiac disease: Secondary | ICD-10-CM | POA: Diagnosis not present

## 2014-05-05 DIAGNOSIS — Z4789 Encounter for other orthopedic aftercare: Secondary | ICD-10-CM | POA: Diagnosis not present

## 2014-05-05 DIAGNOSIS — S72009D Fracture of unspecified part of neck of unspecified femur, subsequent encounter for closed fracture with routine healing: Secondary | ICD-10-CM | POA: Diagnosis not present

## 2014-05-10 DIAGNOSIS — M6281 Muscle weakness (generalized): Secondary | ICD-10-CM | POA: Diagnosis not present

## 2014-05-10 DIAGNOSIS — IMO0001 Reserved for inherently not codable concepts without codable children: Secondary | ICD-10-CM | POA: Diagnosis not present

## 2014-05-10 DIAGNOSIS — K900 Celiac disease: Secondary | ICD-10-CM | POA: Diagnosis not present

## 2014-05-10 DIAGNOSIS — I10 Essential (primary) hypertension: Secondary | ICD-10-CM | POA: Diagnosis not present

## 2014-05-10 DIAGNOSIS — M159 Polyosteoarthritis, unspecified: Secondary | ICD-10-CM | POA: Diagnosis not present

## 2014-05-11 DIAGNOSIS — M159 Polyosteoarthritis, unspecified: Secondary | ICD-10-CM | POA: Diagnosis not present

## 2014-05-11 DIAGNOSIS — M6281 Muscle weakness (generalized): Secondary | ICD-10-CM | POA: Diagnosis not present

## 2014-05-11 DIAGNOSIS — K900 Celiac disease: Secondary | ICD-10-CM | POA: Diagnosis not present

## 2014-05-11 DIAGNOSIS — I10 Essential (primary) hypertension: Secondary | ICD-10-CM | POA: Diagnosis not present

## 2014-05-11 DIAGNOSIS — IMO0001 Reserved for inherently not codable concepts without codable children: Secondary | ICD-10-CM | POA: Diagnosis not present

## 2014-05-12 DIAGNOSIS — M159 Polyosteoarthritis, unspecified: Secondary | ICD-10-CM | POA: Diagnosis not present

## 2014-05-12 DIAGNOSIS — I10 Essential (primary) hypertension: Secondary | ICD-10-CM | POA: Diagnosis not present

## 2014-05-12 DIAGNOSIS — K900 Celiac disease: Secondary | ICD-10-CM | POA: Diagnosis not present

## 2014-05-12 DIAGNOSIS — M6281 Muscle weakness (generalized): Secondary | ICD-10-CM | POA: Diagnosis not present

## 2014-05-12 DIAGNOSIS — IMO0001 Reserved for inherently not codable concepts without codable children: Secondary | ICD-10-CM | POA: Diagnosis not present

## 2014-05-17 DIAGNOSIS — IMO0001 Reserved for inherently not codable concepts without codable children: Secondary | ICD-10-CM | POA: Diagnosis not present

## 2014-05-17 DIAGNOSIS — M6281 Muscle weakness (generalized): Secondary | ICD-10-CM | POA: Diagnosis not present

## 2014-05-17 DIAGNOSIS — K900 Celiac disease: Secondary | ICD-10-CM | POA: Diagnosis not present

## 2014-05-17 DIAGNOSIS — I10 Essential (primary) hypertension: Secondary | ICD-10-CM | POA: Diagnosis not present

## 2014-05-17 DIAGNOSIS — M159 Polyosteoarthritis, unspecified: Secondary | ICD-10-CM | POA: Diagnosis not present

## 2014-05-19 DIAGNOSIS — M6281 Muscle weakness (generalized): Secondary | ICD-10-CM | POA: Diagnosis not present

## 2014-05-19 DIAGNOSIS — I10 Essential (primary) hypertension: Secondary | ICD-10-CM | POA: Diagnosis not present

## 2014-05-19 DIAGNOSIS — IMO0001 Reserved for inherently not codable concepts without codable children: Secondary | ICD-10-CM | POA: Diagnosis not present

## 2014-05-19 DIAGNOSIS — M159 Polyosteoarthritis, unspecified: Secondary | ICD-10-CM | POA: Diagnosis not present

## 2014-05-19 DIAGNOSIS — K900 Celiac disease: Secondary | ICD-10-CM | POA: Diagnosis not present

## 2014-05-22 DIAGNOSIS — K900 Celiac disease: Secondary | ICD-10-CM | POA: Diagnosis not present

## 2014-05-22 DIAGNOSIS — M159 Polyosteoarthritis, unspecified: Secondary | ICD-10-CM | POA: Diagnosis not present

## 2014-05-22 DIAGNOSIS — I10 Essential (primary) hypertension: Secondary | ICD-10-CM | POA: Diagnosis not present

## 2014-05-22 DIAGNOSIS — M6281 Muscle weakness (generalized): Secondary | ICD-10-CM | POA: Diagnosis not present

## 2014-05-22 DIAGNOSIS — IMO0001 Reserved for inherently not codable concepts without codable children: Secondary | ICD-10-CM | POA: Diagnosis not present

## 2014-05-24 DIAGNOSIS — K900 Celiac disease: Secondary | ICD-10-CM | POA: Diagnosis not present

## 2014-05-24 DIAGNOSIS — M159 Polyosteoarthritis, unspecified: Secondary | ICD-10-CM | POA: Diagnosis not present

## 2014-05-24 DIAGNOSIS — M6281 Muscle weakness (generalized): Secondary | ICD-10-CM | POA: Diagnosis not present

## 2014-05-24 DIAGNOSIS — IMO0001 Reserved for inherently not codable concepts without codable children: Secondary | ICD-10-CM | POA: Diagnosis not present

## 2014-05-24 DIAGNOSIS — I10 Essential (primary) hypertension: Secondary | ICD-10-CM | POA: Diagnosis not present

## 2014-05-29 DIAGNOSIS — M6281 Muscle weakness (generalized): Secondary | ICD-10-CM | POA: Diagnosis not present

## 2014-05-29 DIAGNOSIS — K900 Celiac disease: Secondary | ICD-10-CM | POA: Diagnosis not present

## 2014-05-29 DIAGNOSIS — I10 Essential (primary) hypertension: Secondary | ICD-10-CM | POA: Diagnosis not present

## 2014-05-29 DIAGNOSIS — M159 Polyosteoarthritis, unspecified: Secondary | ICD-10-CM | POA: Diagnosis not present

## 2014-05-29 DIAGNOSIS — IMO0001 Reserved for inherently not codable concepts without codable children: Secondary | ICD-10-CM | POA: Diagnosis not present

## 2014-05-31 DIAGNOSIS — K900 Celiac disease: Secondary | ICD-10-CM | POA: Diagnosis not present

## 2014-05-31 DIAGNOSIS — M159 Polyosteoarthritis, unspecified: Secondary | ICD-10-CM | POA: Diagnosis not present

## 2014-05-31 DIAGNOSIS — M6281 Muscle weakness (generalized): Secondary | ICD-10-CM | POA: Diagnosis not present

## 2014-05-31 DIAGNOSIS — IMO0001 Reserved for inherently not codable concepts without codable children: Secondary | ICD-10-CM | POA: Diagnosis not present

## 2014-05-31 DIAGNOSIS — I10 Essential (primary) hypertension: Secondary | ICD-10-CM | POA: Diagnosis not present

## 2014-06-05 DIAGNOSIS — M6281 Muscle weakness (generalized): Secondary | ICD-10-CM | POA: Diagnosis not present

## 2014-06-05 DIAGNOSIS — I10 Essential (primary) hypertension: Secondary | ICD-10-CM | POA: Diagnosis not present

## 2014-06-05 DIAGNOSIS — K900 Celiac disease: Secondary | ICD-10-CM | POA: Diagnosis not present

## 2014-06-05 DIAGNOSIS — M159 Polyosteoarthritis, unspecified: Secondary | ICD-10-CM | POA: Diagnosis not present

## 2014-06-05 DIAGNOSIS — IMO0001 Reserved for inherently not codable concepts without codable children: Secondary | ICD-10-CM | POA: Diagnosis not present

## 2014-06-07 DIAGNOSIS — M6281 Muscle weakness (generalized): Secondary | ICD-10-CM | POA: Diagnosis not present

## 2014-06-07 DIAGNOSIS — IMO0001 Reserved for inherently not codable concepts without codable children: Secondary | ICD-10-CM | POA: Diagnosis not present

## 2014-06-07 DIAGNOSIS — I10 Essential (primary) hypertension: Secondary | ICD-10-CM | POA: Diagnosis not present

## 2014-06-07 DIAGNOSIS — K900 Celiac disease: Secondary | ICD-10-CM | POA: Diagnosis not present

## 2014-06-07 DIAGNOSIS — M159 Polyosteoarthritis, unspecified: Secondary | ICD-10-CM | POA: Diagnosis not present

## 2014-06-08 ENCOUNTER — Other Ambulatory Visit (HOSPITAL_COMMUNITY): Payer: Self-pay | Admitting: Family Medicine

## 2014-06-08 DIAGNOSIS — D510 Vitamin B12 deficiency anemia due to intrinsic factor deficiency: Secondary | ICD-10-CM | POA: Diagnosis not present

## 2014-06-08 DIAGNOSIS — M810 Age-related osteoporosis without current pathological fracture: Secondary | ICD-10-CM

## 2014-06-08 DIAGNOSIS — IMO0002 Reserved for concepts with insufficient information to code with codable children: Secondary | ICD-10-CM | POA: Diagnosis not present

## 2014-06-08 DIAGNOSIS — R609 Edema, unspecified: Secondary | ICD-10-CM | POA: Diagnosis not present

## 2014-06-08 DIAGNOSIS — R7301 Impaired fasting glucose: Secondary | ICD-10-CM | POA: Diagnosis not present

## 2014-06-08 DIAGNOSIS — Z0000 Encounter for general adult medical examination without abnormal findings: Secondary | ICD-10-CM | POA: Diagnosis not present

## 2014-06-14 ENCOUNTER — Ambulatory Visit (HOSPITAL_COMMUNITY)
Admission: RE | Admit: 2014-06-14 | Discharge: 2014-06-14 | Disposition: A | Discharge status: Home / Self Care | Payer: Medicare Other | Source: Ambulatory Visit | Attending: Family Medicine | Admitting: Family Medicine

## 2014-06-14 DIAGNOSIS — M810 Age-related osteoporosis without current pathological fracture: Secondary | ICD-10-CM | POA: Diagnosis not present

## 2014-06-30 DIAGNOSIS — S72009D Fracture of unspecified part of neck of unspecified femur, subsequent encounter for closed fracture with routine healing: Secondary | ICD-10-CM | POA: Diagnosis not present

## 2014-06-30 DIAGNOSIS — Z4789 Encounter for other orthopedic aftercare: Secondary | ICD-10-CM | POA: Diagnosis not present

## 2014-07-04 DIAGNOSIS — S7290XA Unspecified fracture of unspecified femur, initial encounter for closed fracture: Secondary | ICD-10-CM | POA: Diagnosis not present

## 2014-07-06 DIAGNOSIS — S7290XA Unspecified fracture of unspecified femur, initial encounter for closed fracture: Secondary | ICD-10-CM | POA: Diagnosis not present

## 2014-07-11 DIAGNOSIS — S7290XA Unspecified fracture of unspecified femur, initial encounter for closed fracture: Secondary | ICD-10-CM | POA: Diagnosis not present

## 2014-07-13 DIAGNOSIS — M79605 Pain in left leg: Secondary | ICD-10-CM | POA: Diagnosis not present

## 2014-07-13 DIAGNOSIS — R2689 Other abnormalities of gait and mobility: Secondary | ICD-10-CM | POA: Diagnosis not present

## 2014-07-13 DIAGNOSIS — S72142D Displaced intertrochanteric fracture of left femur, subsequent encounter for closed fracture with routine healing: Secondary | ICD-10-CM | POA: Diagnosis not present

## 2014-07-17 DIAGNOSIS — R2689 Other abnormalities of gait and mobility: Secondary | ICD-10-CM | POA: Diagnosis not present

## 2014-07-17 DIAGNOSIS — S72142D Displaced intertrochanteric fracture of left femur, subsequent encounter for closed fracture with routine healing: Secondary | ICD-10-CM | POA: Diagnosis not present

## 2014-07-17 DIAGNOSIS — M79605 Pain in left leg: Secondary | ICD-10-CM | POA: Diagnosis not present

## 2014-07-20 DIAGNOSIS — R2689 Other abnormalities of gait and mobility: Secondary | ICD-10-CM | POA: Diagnosis not present

## 2014-07-20 DIAGNOSIS — S72142D Displaced intertrochanteric fracture of left femur, subsequent encounter for closed fracture with routine healing: Secondary | ICD-10-CM | POA: Diagnosis not present

## 2014-07-20 DIAGNOSIS — M79605 Pain in left leg: Secondary | ICD-10-CM | POA: Diagnosis not present

## 2014-08-01 DIAGNOSIS — S72142D Displaced intertrochanteric fracture of left femur, subsequent encounter for closed fracture with routine healing: Secondary | ICD-10-CM | POA: Diagnosis not present

## 2014-08-01 DIAGNOSIS — M79605 Pain in left leg: Secondary | ICD-10-CM | POA: Diagnosis not present

## 2014-08-01 DIAGNOSIS — R2689 Other abnormalities of gait and mobility: Secondary | ICD-10-CM | POA: Diagnosis not present

## 2014-08-03 DIAGNOSIS — S72142D Displaced intertrochanteric fracture of left femur, subsequent encounter for closed fracture with routine healing: Secondary | ICD-10-CM | POA: Diagnosis not present

## 2014-08-03 DIAGNOSIS — M79605 Pain in left leg: Secondary | ICD-10-CM | POA: Diagnosis not present

## 2014-08-03 DIAGNOSIS — R2689 Other abnormalities of gait and mobility: Secondary | ICD-10-CM | POA: Diagnosis not present

## 2014-08-07 DIAGNOSIS — M79605 Pain in left leg: Secondary | ICD-10-CM | POA: Diagnosis not present

## 2014-08-07 DIAGNOSIS — S72142D Displaced intertrochanteric fracture of left femur, subsequent encounter for closed fracture with routine healing: Secondary | ICD-10-CM | POA: Diagnosis not present

## 2014-08-07 DIAGNOSIS — R2689 Other abnormalities of gait and mobility: Secondary | ICD-10-CM | POA: Diagnosis not present

## 2014-08-09 DIAGNOSIS — S72142D Displaced intertrochanteric fracture of left femur, subsequent encounter for closed fracture with routine healing: Secondary | ICD-10-CM | POA: Diagnosis not present

## 2014-08-09 DIAGNOSIS — M79605 Pain in left leg: Secondary | ICD-10-CM | POA: Diagnosis not present

## 2014-08-09 DIAGNOSIS — R2689 Other abnormalities of gait and mobility: Secondary | ICD-10-CM | POA: Diagnosis not present

## 2014-08-14 DIAGNOSIS — G4700 Insomnia, unspecified: Secondary | ICD-10-CM | POA: Diagnosis not present

## 2014-08-14 DIAGNOSIS — Z23 Encounter for immunization: Secondary | ICD-10-CM | POA: Diagnosis not present

## 2014-08-14 DIAGNOSIS — G2581 Restless legs syndrome: Secondary | ICD-10-CM | POA: Diagnosis not present

## 2014-08-14 DIAGNOSIS — I10 Essential (primary) hypertension: Secondary | ICD-10-CM | POA: Diagnosis not present

## 2014-08-14 DIAGNOSIS — L723 Sebaceous cyst: Secondary | ICD-10-CM | POA: Diagnosis not present

## 2014-08-14 DIAGNOSIS — M810 Age-related osteoporosis without current pathological fracture: Secondary | ICD-10-CM | POA: Diagnosis not present

## 2014-08-14 DIAGNOSIS — L988 Other specified disorders of the skin and subcutaneous tissue: Secondary | ICD-10-CM | POA: Diagnosis not present

## 2014-08-14 DIAGNOSIS — L570 Actinic keratosis: Secondary | ICD-10-CM | POA: Diagnosis not present

## 2014-08-14 DIAGNOSIS — L821 Other seborrheic keratosis: Secondary | ICD-10-CM | POA: Diagnosis not present

## 2014-08-14 DIAGNOSIS — L820 Inflamed seborrheic keratosis: Secondary | ICD-10-CM | POA: Diagnosis not present

## 2014-08-14 DIAGNOSIS — K900 Celiac disease: Secondary | ICD-10-CM | POA: Diagnosis not present

## 2014-08-14 DIAGNOSIS — K219 Gastro-esophageal reflux disease without esophagitis: Secondary | ICD-10-CM | POA: Diagnosis not present

## 2014-08-14 DIAGNOSIS — N400 Enlarged prostate without lower urinary tract symptoms: Secondary | ICD-10-CM | POA: Diagnosis not present

## 2014-08-16 DIAGNOSIS — S72142D Displaced intertrochanteric fracture of left femur, subsequent encounter for closed fracture with routine healing: Secondary | ICD-10-CM | POA: Diagnosis not present

## 2014-08-16 DIAGNOSIS — R2689 Other abnormalities of gait and mobility: Secondary | ICD-10-CM | POA: Diagnosis not present

## 2014-08-16 DIAGNOSIS — M79605 Pain in left leg: Secondary | ICD-10-CM | POA: Diagnosis not present

## 2014-08-18 DIAGNOSIS — R2689 Other abnormalities of gait and mobility: Secondary | ICD-10-CM | POA: Diagnosis not present

## 2014-08-18 DIAGNOSIS — M79605 Pain in left leg: Secondary | ICD-10-CM | POA: Diagnosis not present

## 2014-08-18 DIAGNOSIS — S72142D Displaced intertrochanteric fracture of left femur, subsequent encounter for closed fracture with routine healing: Secondary | ICD-10-CM | POA: Diagnosis not present

## 2014-08-21 DIAGNOSIS — M79605 Pain in left leg: Secondary | ICD-10-CM | POA: Diagnosis not present

## 2014-08-21 DIAGNOSIS — R2689 Other abnormalities of gait and mobility: Secondary | ICD-10-CM | POA: Diagnosis not present

## 2014-08-21 DIAGNOSIS — S72142D Displaced intertrochanteric fracture of left femur, subsequent encounter for closed fracture with routine healing: Secondary | ICD-10-CM | POA: Diagnosis not present

## 2014-08-25 DIAGNOSIS — S72142D Displaced intertrochanteric fracture of left femur, subsequent encounter for closed fracture with routine healing: Secondary | ICD-10-CM | POA: Diagnosis not present

## 2014-08-25 DIAGNOSIS — M79605 Pain in left leg: Secondary | ICD-10-CM | POA: Diagnosis not present

## 2014-08-25 DIAGNOSIS — R2689 Other abnormalities of gait and mobility: Secondary | ICD-10-CM | POA: Diagnosis not present

## 2014-08-28 DIAGNOSIS — R2689 Other abnormalities of gait and mobility: Secondary | ICD-10-CM | POA: Diagnosis not present

## 2014-08-28 DIAGNOSIS — S72142D Displaced intertrochanteric fracture of left femur, subsequent encounter for closed fracture with routine healing: Secondary | ICD-10-CM | POA: Diagnosis not present

## 2014-08-28 DIAGNOSIS — M79605 Pain in left leg: Secondary | ICD-10-CM | POA: Diagnosis not present

## 2014-09-01 DIAGNOSIS — R2689 Other abnormalities of gait and mobility: Secondary | ICD-10-CM | POA: Diagnosis not present

## 2014-09-01 DIAGNOSIS — M79605 Pain in left leg: Secondary | ICD-10-CM | POA: Diagnosis not present

## 2014-09-01 DIAGNOSIS — S72142D Displaced intertrochanteric fracture of left femur, subsequent encounter for closed fracture with routine healing: Secondary | ICD-10-CM | POA: Diagnosis not present

## 2014-09-04 DIAGNOSIS — R2689 Other abnormalities of gait and mobility: Secondary | ICD-10-CM | POA: Diagnosis not present

## 2014-09-04 DIAGNOSIS — M79605 Pain in left leg: Secondary | ICD-10-CM | POA: Diagnosis not present

## 2014-09-04 DIAGNOSIS — S72142D Displaced intertrochanteric fracture of left femur, subsequent encounter for closed fracture with routine healing: Secondary | ICD-10-CM | POA: Diagnosis not present

## 2014-09-13 DIAGNOSIS — R2689 Other abnormalities of gait and mobility: Secondary | ICD-10-CM | POA: Diagnosis not present

## 2014-09-13 DIAGNOSIS — S72142D Displaced intertrochanteric fracture of left femur, subsequent encounter for closed fracture with routine healing: Secondary | ICD-10-CM | POA: Diagnosis not present

## 2014-09-13 DIAGNOSIS — M79605 Pain in left leg: Secondary | ICD-10-CM | POA: Diagnosis not present

## 2014-09-15 DIAGNOSIS — R2689 Other abnormalities of gait and mobility: Secondary | ICD-10-CM | POA: Diagnosis not present

## 2014-09-15 DIAGNOSIS — M79605 Pain in left leg: Secondary | ICD-10-CM | POA: Diagnosis not present

## 2014-09-15 DIAGNOSIS — S72142D Displaced intertrochanteric fracture of left femur, subsequent encounter for closed fracture with routine healing: Secondary | ICD-10-CM | POA: Diagnosis not present

## 2014-09-26 ENCOUNTER — Ambulatory Visit (INDEPENDENT_AMBULATORY_CARE_PROVIDER_SITE_OTHER): Payer: Medicare Other | Admitting: Urology

## 2014-09-26 DIAGNOSIS — N400 Enlarged prostate without lower urinary tract symptoms: Secondary | ICD-10-CM

## 2014-10-27 DIAGNOSIS — R2689 Other abnormalities of gait and mobility: Secondary | ICD-10-CM | POA: Diagnosis not present

## 2014-10-27 DIAGNOSIS — M79605 Pain in left leg: Secondary | ICD-10-CM | POA: Diagnosis not present

## 2014-10-27 DIAGNOSIS — S72142D Displaced intertrochanteric fracture of left femur, subsequent encounter for closed fracture with routine healing: Secondary | ICD-10-CM | POA: Diagnosis not present

## 2014-10-30 DIAGNOSIS — R2689 Other abnormalities of gait and mobility: Secondary | ICD-10-CM | POA: Diagnosis not present

## 2014-10-30 DIAGNOSIS — M79605 Pain in left leg: Secondary | ICD-10-CM | POA: Diagnosis not present

## 2014-10-30 DIAGNOSIS — S72142D Displaced intertrochanteric fracture of left femur, subsequent encounter for closed fracture with routine healing: Secondary | ICD-10-CM | POA: Diagnosis not present

## 2014-11-01 DIAGNOSIS — R2689 Other abnormalities of gait and mobility: Secondary | ICD-10-CM | POA: Diagnosis not present

## 2014-11-01 DIAGNOSIS — S72142D Displaced intertrochanteric fracture of left femur, subsequent encounter for closed fracture with routine healing: Secondary | ICD-10-CM | POA: Diagnosis not present

## 2014-11-01 DIAGNOSIS — M79605 Pain in left leg: Secondary | ICD-10-CM | POA: Diagnosis not present

## 2014-11-08 DIAGNOSIS — S72142D Displaced intertrochanteric fracture of left femur, subsequent encounter for closed fracture with routine healing: Secondary | ICD-10-CM | POA: Diagnosis not present

## 2014-11-08 DIAGNOSIS — M79605 Pain in left leg: Secondary | ICD-10-CM | POA: Diagnosis not present

## 2014-11-08 DIAGNOSIS — R2689 Other abnormalities of gait and mobility: Secondary | ICD-10-CM | POA: Diagnosis not present

## 2014-11-10 DIAGNOSIS — S72142D Displaced intertrochanteric fracture of left femur, subsequent encounter for closed fracture with routine healing: Secondary | ICD-10-CM | POA: Diagnosis not present

## 2014-11-10 DIAGNOSIS — R2689 Other abnormalities of gait and mobility: Secondary | ICD-10-CM | POA: Diagnosis not present

## 2014-11-10 DIAGNOSIS — M79605 Pain in left leg: Secondary | ICD-10-CM | POA: Diagnosis not present

## 2014-11-13 DIAGNOSIS — S72142D Displaced intertrochanteric fracture of left femur, subsequent encounter for closed fracture with routine healing: Secondary | ICD-10-CM | POA: Diagnosis not present

## 2014-11-13 DIAGNOSIS — M79605 Pain in left leg: Secondary | ICD-10-CM | POA: Diagnosis not present

## 2014-11-13 DIAGNOSIS — R2689 Other abnormalities of gait and mobility: Secondary | ICD-10-CM | POA: Diagnosis not present

## 2014-11-17 DIAGNOSIS — M79605 Pain in left leg: Secondary | ICD-10-CM | POA: Diagnosis not present

## 2014-11-17 DIAGNOSIS — R2689 Other abnormalities of gait and mobility: Secondary | ICD-10-CM | POA: Diagnosis not present

## 2014-11-17 DIAGNOSIS — S72142D Displaced intertrochanteric fracture of left femur, subsequent encounter for closed fracture with routine healing: Secondary | ICD-10-CM | POA: Diagnosis not present

## 2014-11-21 DIAGNOSIS — R2689 Other abnormalities of gait and mobility: Secondary | ICD-10-CM | POA: Diagnosis not present

## 2014-11-21 DIAGNOSIS — M79605 Pain in left leg: Secondary | ICD-10-CM | POA: Diagnosis not present

## 2014-11-21 DIAGNOSIS — S72142D Displaced intertrochanteric fracture of left femur, subsequent encounter for closed fracture with routine healing: Secondary | ICD-10-CM | POA: Diagnosis not present

## 2014-11-24 DIAGNOSIS — S72142D Displaced intertrochanteric fracture of left femur, subsequent encounter for closed fracture with routine healing: Secondary | ICD-10-CM | POA: Diagnosis not present

## 2014-11-24 DIAGNOSIS — R2689 Other abnormalities of gait and mobility: Secondary | ICD-10-CM | POA: Diagnosis not present

## 2014-11-24 DIAGNOSIS — M79605 Pain in left leg: Secondary | ICD-10-CM | POA: Diagnosis not present

## 2014-11-28 DIAGNOSIS — S72142D Displaced intertrochanteric fracture of left femur, subsequent encounter for closed fracture with routine healing: Secondary | ICD-10-CM | POA: Diagnosis not present

## 2014-11-28 DIAGNOSIS — R2689 Other abnormalities of gait and mobility: Secondary | ICD-10-CM | POA: Diagnosis not present

## 2014-11-28 DIAGNOSIS — M79605 Pain in left leg: Secondary | ICD-10-CM | POA: Diagnosis not present

## 2014-12-07 DIAGNOSIS — S72142G Displaced intertrochanteric fracture of left femur, subsequent encounter for closed fracture with delayed healing: Secondary | ICD-10-CM | POA: Diagnosis not present

## 2014-12-07 DIAGNOSIS — M7062 Trochanteric bursitis, left hip: Secondary | ICD-10-CM | POA: Diagnosis not present

## 2014-12-19 DIAGNOSIS — M79605 Pain in left leg: Secondary | ICD-10-CM | POA: Diagnosis not present

## 2014-12-19 DIAGNOSIS — S72142D Displaced intertrochanteric fracture of left femur, subsequent encounter for closed fracture with routine healing: Secondary | ICD-10-CM | POA: Diagnosis not present

## 2014-12-19 DIAGNOSIS — R2689 Other abnormalities of gait and mobility: Secondary | ICD-10-CM | POA: Diagnosis not present

## 2015-01-18 DIAGNOSIS — M7062 Trochanteric bursitis, left hip: Secondary | ICD-10-CM | POA: Diagnosis not present

## 2015-01-18 DIAGNOSIS — Z4789 Encounter for other orthopedic aftercare: Secondary | ICD-10-CM | POA: Diagnosis not present

## 2015-02-12 DIAGNOSIS — Z85828 Personal history of other malignant neoplasm of skin: Secondary | ICD-10-CM | POA: Diagnosis not present

## 2015-02-12 DIAGNOSIS — L723 Sebaceous cyst: Secondary | ICD-10-CM | POA: Diagnosis not present

## 2015-02-12 DIAGNOSIS — L821 Other seborrheic keratosis: Secondary | ICD-10-CM | POA: Diagnosis not present

## 2015-02-12 DIAGNOSIS — L570 Actinic keratosis: Secondary | ICD-10-CM | POA: Diagnosis not present

## 2015-02-12 DIAGNOSIS — L814 Other melanin hyperpigmentation: Secondary | ICD-10-CM | POA: Diagnosis not present

## 2015-02-20 DIAGNOSIS — N400 Enlarged prostate without lower urinary tract symptoms: Secondary | ICD-10-CM | POA: Diagnosis not present

## 2015-02-20 DIAGNOSIS — I10 Essential (primary) hypertension: Secondary | ICD-10-CM | POA: Diagnosis not present

## 2015-02-20 DIAGNOSIS — K900 Celiac disease: Secondary | ICD-10-CM | POA: Diagnosis not present

## 2015-02-20 DIAGNOSIS — Z6824 Body mass index (BMI) 24.0-24.9, adult: Secondary | ICD-10-CM | POA: Diagnosis not present

## 2015-02-20 DIAGNOSIS — S72002D Fracture of unspecified part of neck of left femur, subsequent encounter for closed fracture with routine healing: Secondary | ICD-10-CM | POA: Diagnosis not present

## 2015-02-20 DIAGNOSIS — S32009D Unspecified fracture of unspecified lumbar vertebra, subsequent encounter for fracture with routine healing: Secondary | ICD-10-CM | POA: Diagnosis not present

## 2015-02-20 DIAGNOSIS — M1990 Unspecified osteoarthritis, unspecified site: Secondary | ICD-10-CM | POA: Diagnosis not present

## 2015-02-20 DIAGNOSIS — Z1389 Encounter for screening for other disorder: Secondary | ICD-10-CM | POA: Diagnosis not present

## 2015-02-20 DIAGNOSIS — M810 Age-related osteoporosis without current pathological fracture: Secondary | ICD-10-CM | POA: Diagnosis not present

## 2015-03-19 DIAGNOSIS — H04213 Epiphora due to excess lacrimation, bilateral lacrimal glands: Secondary | ICD-10-CM | POA: Diagnosis not present

## 2015-03-19 DIAGNOSIS — H43813 Vitreous degeneration, bilateral: Secondary | ICD-10-CM | POA: Diagnosis not present

## 2015-03-19 DIAGNOSIS — Z961 Presence of intraocular lens: Secondary | ICD-10-CM | POA: Diagnosis not present

## 2015-03-19 DIAGNOSIS — H52203 Unspecified astigmatism, bilateral: Secondary | ICD-10-CM | POA: Diagnosis not present

## 2015-08-15 DIAGNOSIS — Z125 Encounter for screening for malignant neoplasm of prostate: Secondary | ICD-10-CM | POA: Diagnosis not present

## 2015-08-15 DIAGNOSIS — M810 Age-related osteoporosis without current pathological fracture: Secondary | ICD-10-CM | POA: Diagnosis not present

## 2015-08-15 DIAGNOSIS — L814 Other melanin hyperpigmentation: Secondary | ICD-10-CM | POA: Diagnosis not present

## 2015-08-15 DIAGNOSIS — R05 Cough: Secondary | ICD-10-CM | POA: Diagnosis not present

## 2015-08-15 DIAGNOSIS — I10 Essential (primary) hypertension: Secondary | ICD-10-CM | POA: Diagnosis not present

## 2015-08-15 DIAGNOSIS — L570 Actinic keratosis: Secondary | ICD-10-CM | POA: Diagnosis not present

## 2015-08-15 DIAGNOSIS — K219 Gastro-esophageal reflux disease without esophagitis: Secondary | ICD-10-CM | POA: Diagnosis not present

## 2015-08-15 DIAGNOSIS — Z6824 Body mass index (BMI) 24.0-24.9, adult: Secondary | ICD-10-CM | POA: Diagnosis not present

## 2015-08-15 DIAGNOSIS — D224 Melanocytic nevi of scalp and neck: Secondary | ICD-10-CM | POA: Diagnosis not present

## 2015-08-15 DIAGNOSIS — D485 Neoplasm of uncertain behavior of skin: Secondary | ICD-10-CM | POA: Diagnosis not present

## 2015-08-15 DIAGNOSIS — C4442 Squamous cell carcinoma of skin of scalp and neck: Secondary | ICD-10-CM | POA: Diagnosis not present

## 2015-08-23 DIAGNOSIS — M810 Age-related osteoporosis without current pathological fracture: Secondary | ICD-10-CM | POA: Diagnosis not present

## 2015-08-23 DIAGNOSIS — Z23 Encounter for immunization: Secondary | ICD-10-CM | POA: Diagnosis not present

## 2015-08-23 DIAGNOSIS — M1990 Unspecified osteoarthritis, unspecified site: Secondary | ICD-10-CM | POA: Diagnosis not present

## 2015-08-23 DIAGNOSIS — K219 Gastro-esophageal reflux disease without esophagitis: Secondary | ICD-10-CM | POA: Diagnosis not present

## 2015-08-23 DIAGNOSIS — L988 Other specified disorders of the skin and subcutaneous tissue: Secondary | ICD-10-CM | POA: Diagnosis not present

## 2015-08-23 DIAGNOSIS — Z6824 Body mass index (BMI) 24.0-24.9, adult: Secondary | ICD-10-CM | POA: Diagnosis not present

## 2015-08-23 DIAGNOSIS — E784 Other hyperlipidemia: Secondary | ICD-10-CM | POA: Diagnosis not present

## 2015-08-23 DIAGNOSIS — I10 Essential (primary) hypertension: Secondary | ICD-10-CM | POA: Diagnosis not present

## 2015-08-23 DIAGNOSIS — G2581 Restless legs syndrome: Secondary | ICD-10-CM | POA: Diagnosis not present

## 2015-08-23 DIAGNOSIS — K900 Celiac disease: Secondary | ICD-10-CM | POA: Diagnosis not present

## 2015-08-23 DIAGNOSIS — G4700 Insomnia, unspecified: Secondary | ICD-10-CM | POA: Diagnosis not present

## 2015-08-23 DIAGNOSIS — Z0000 Encounter for general adult medical examination without abnormal findings: Secondary | ICD-10-CM | POA: Diagnosis not present

## 2015-08-24 IMAGING — CR DG HIP (WITH OR WITHOUT PELVIS) 2-3V*L*
4 series · 4 of 4 positions shown · non-contrast
Comparison: CT scan of the abdomen and pelvis dated January 18, 2009

CLINICAL DATA: Left hip pain status post fall

EXAM:
LEFT HIP - COMPLETE 2+ VIEW

[w hip lat left]
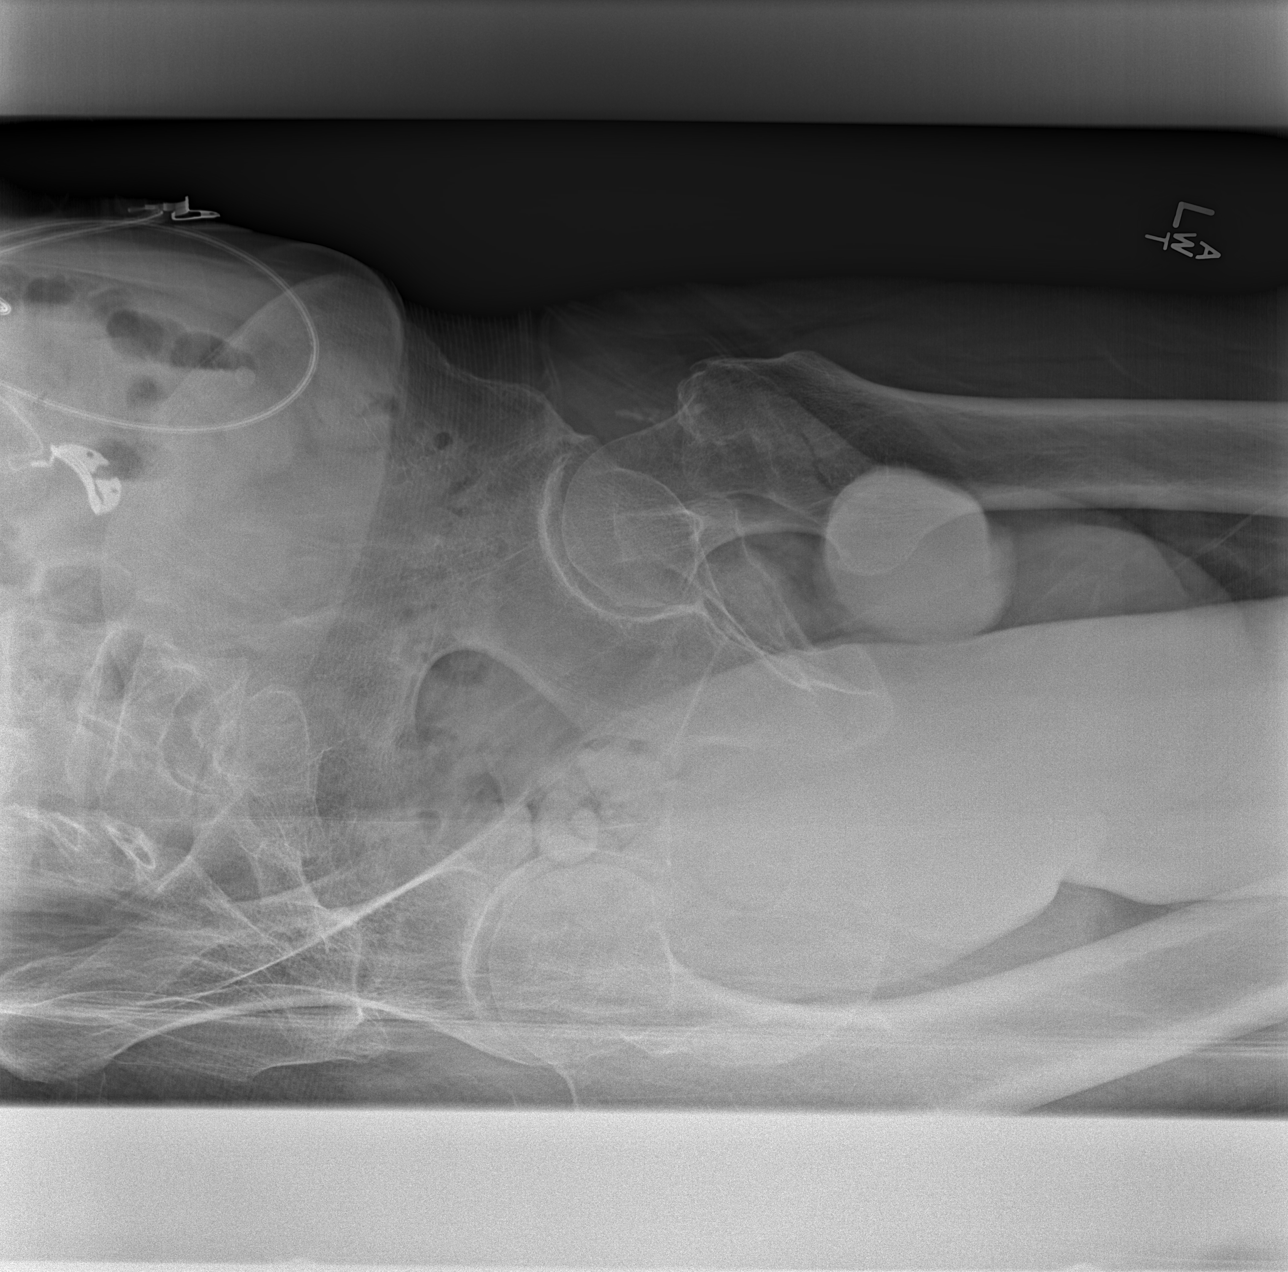

[x pelvis (1 of 3)]
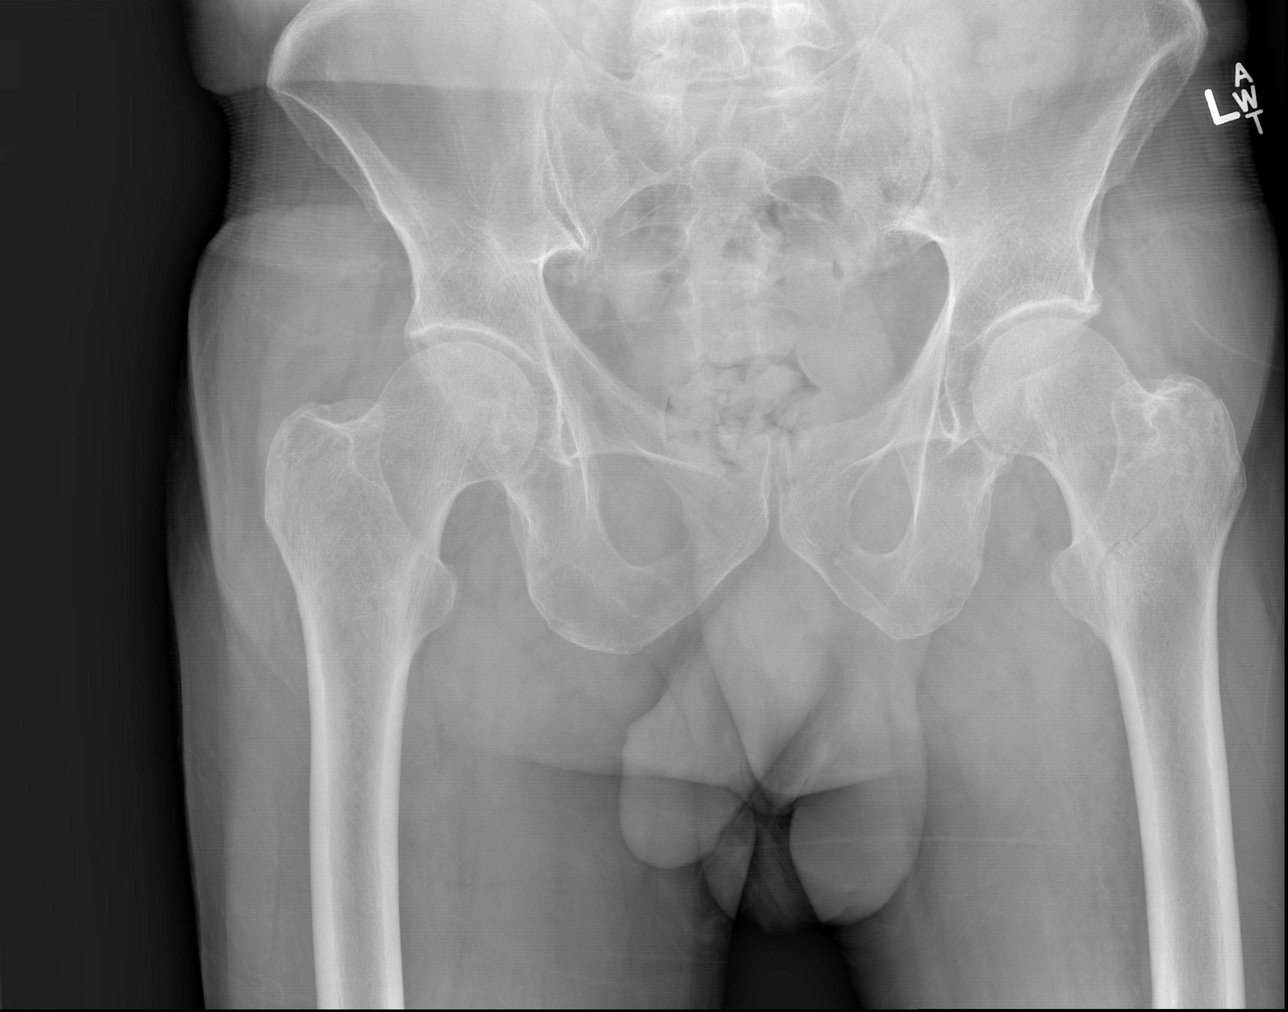

[x pelvis (2 of 3)]
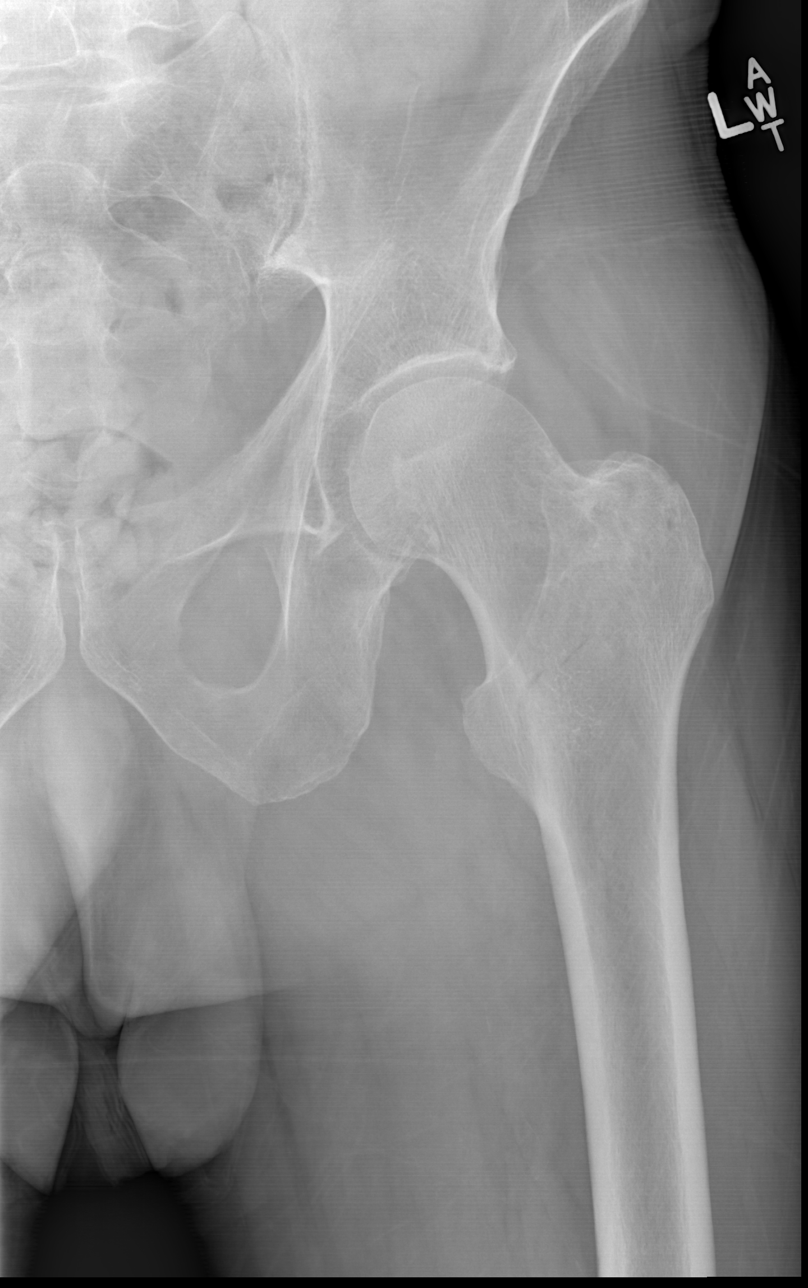

[x pelvis (3 of 3)]
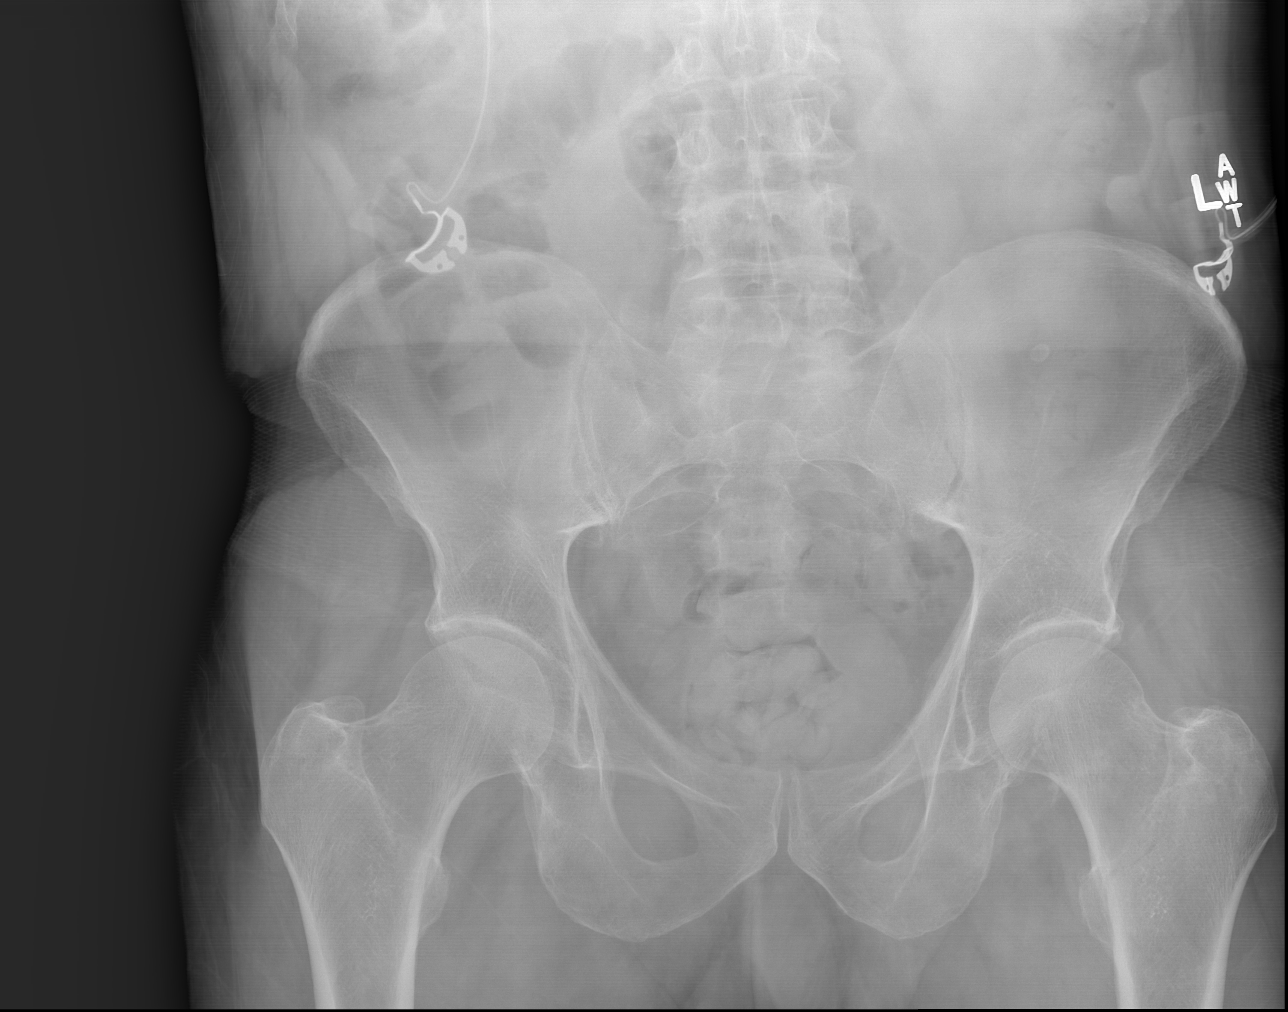

[4 of 4 positions shown; findings below may reference images not displayed]

FINDINGS: The bony pelvis is adequately mineralized. There is no acute
fracture nor dislocation. The SI joints and sacrum exhibit no acute
abnormality.

There is abnormal lucency in the intertrochanteric region on the
left consistent with a nondisplaced fracture. The left hip joint
space is preserved. The femoral head and neck are intact.
IMPRESSION: There is an acute nondisplaced fracture of the intertrochanteric
region of the left hip.

## 2015-08-24 IMAGING — CR DG CHEST 1V
1 series · 1 of 1 positions shown · non-contrast
Comparison: August 14, 2011

CLINICAL DATA: Pain post trauma

EXAM:
CHEST - 1 VIEW

[x chest ap]
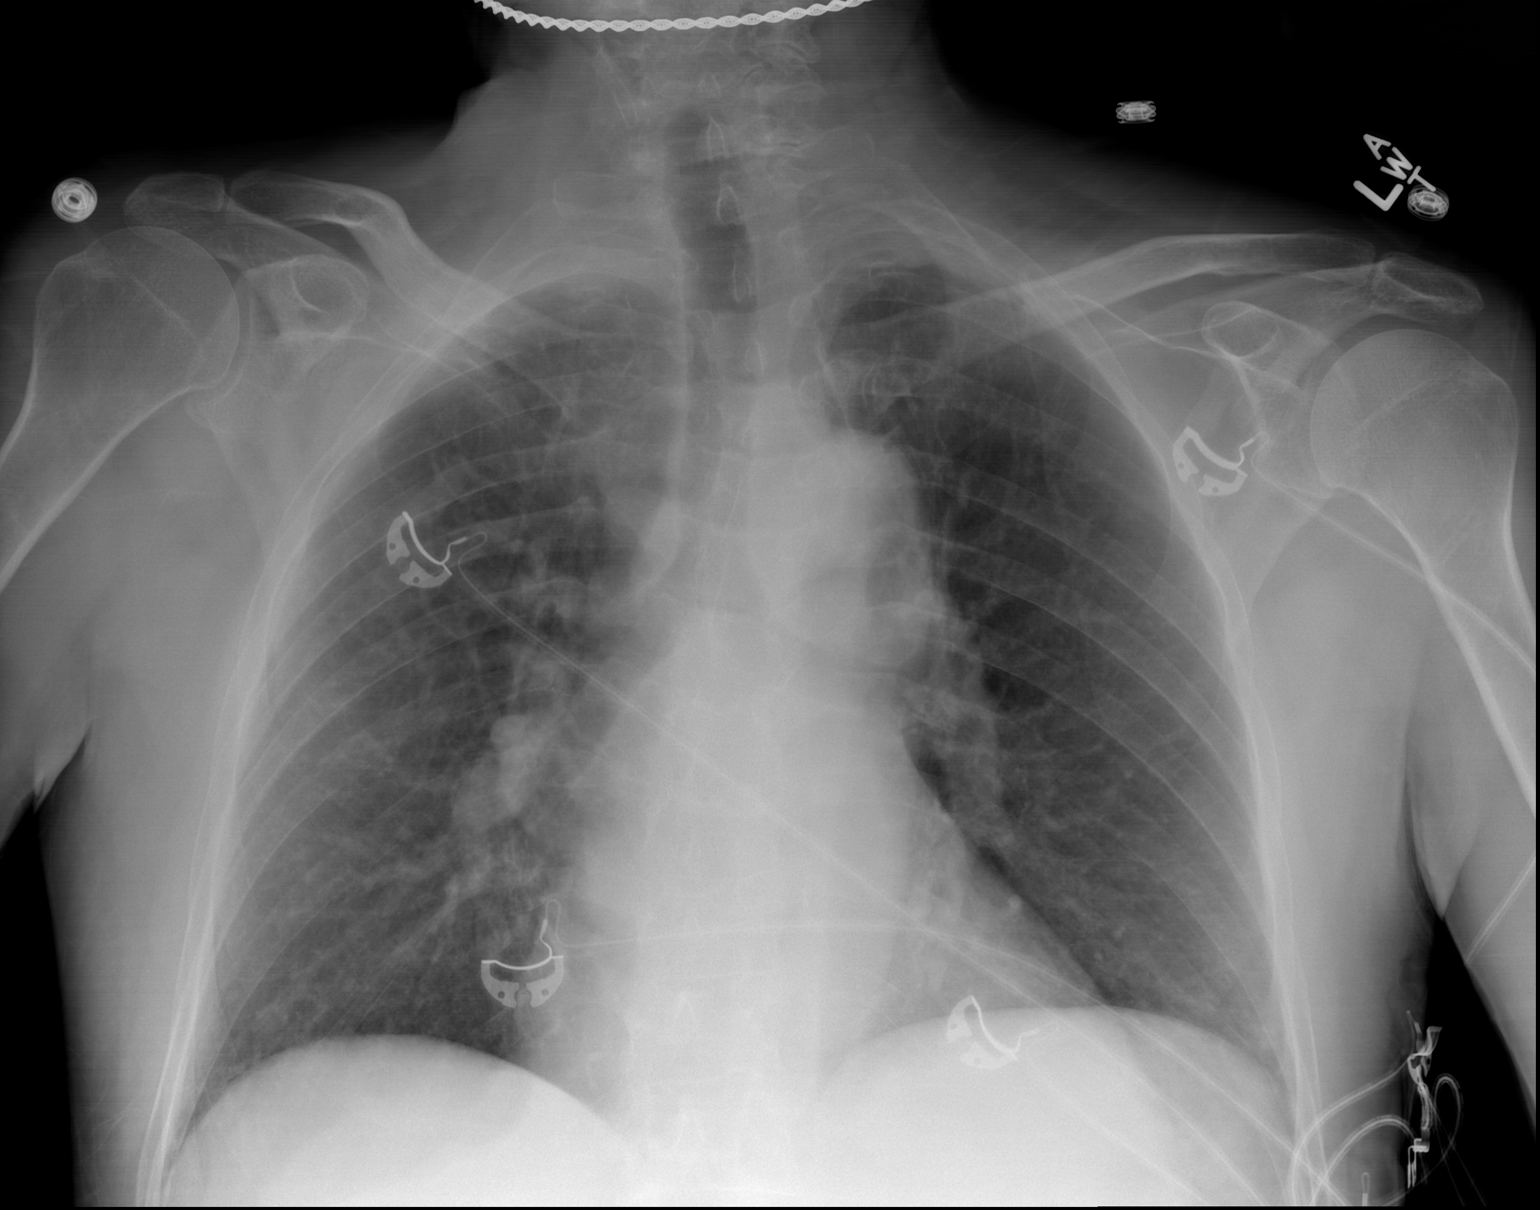

[1 of 1 positions shown; findings below may reference images not displayed]

FINDINGS: The lungs are clear. The heart size and pulmonary vascularity are
within normal limits. No adenopathy. No bone lesions.
IMPRESSION: No edema or consolidation.

## 2015-09-25 ENCOUNTER — Ambulatory Visit (INDEPENDENT_AMBULATORY_CARE_PROVIDER_SITE_OTHER): Payer: Medicare Other | Admitting: Urology

## 2015-09-25 DIAGNOSIS — N400 Enlarged prostate without lower urinary tract symptoms: Secondary | ICD-10-CM | POA: Diagnosis not present

## 2015-09-25 DIAGNOSIS — N401 Enlarged prostate with lower urinary tract symptoms: Secondary | ICD-10-CM

## 2015-09-25 DIAGNOSIS — R351 Nocturia: Secondary | ICD-10-CM | POA: Diagnosis not present

## 2015-09-25 DIAGNOSIS — N5201 Erectile dysfunction due to arterial insufficiency: Secondary | ICD-10-CM | POA: Diagnosis not present

## 2015-10-25 DIAGNOSIS — K219 Gastro-esophageal reflux disease without esophagitis: Secondary | ICD-10-CM | POA: Diagnosis not present

## 2015-10-25 DIAGNOSIS — Z6823 Body mass index (BMI) 23.0-23.9, adult: Secondary | ICD-10-CM | POA: Diagnosis not present

## 2015-10-25 DIAGNOSIS — R05 Cough: Secondary | ICD-10-CM | POA: Diagnosis not present

## 2015-10-25 DIAGNOSIS — J309 Allergic rhinitis, unspecified: Secondary | ICD-10-CM | POA: Diagnosis not present

## 2015-10-25 DIAGNOSIS — J40 Bronchitis, not specified as acute or chronic: Secondary | ICD-10-CM | POA: Diagnosis not present

## 2015-11-13 DIAGNOSIS — K922 Gastrointestinal hemorrhage, unspecified: Secondary | ICD-10-CM | POA: Diagnosis not present

## 2015-12-04 DIAGNOSIS — K625 Hemorrhage of anus and rectum: Secondary | ICD-10-CM | POA: Diagnosis not present

## 2015-12-04 DIAGNOSIS — Z800 Family history of malignant neoplasm of digestive organs: Secondary | ICD-10-CM | POA: Diagnosis not present

## 2015-12-04 DIAGNOSIS — K921 Melena: Secondary | ICD-10-CM | POA: Diagnosis not present

## 2015-12-04 DIAGNOSIS — K5730 Diverticulosis of large intestine without perforation or abscess without bleeding: Secondary | ICD-10-CM | POA: Diagnosis not present

## 2015-12-04 DIAGNOSIS — R21 Rash and other nonspecific skin eruption: Secondary | ICD-10-CM | POA: Diagnosis not present

## 2016-01-18 DIAGNOSIS — R0602 Shortness of breath: Secondary | ICD-10-CM | POA: Diagnosis not present

## 2016-01-18 DIAGNOSIS — S72142G Displaced intertrochanteric fracture of left femur, subsequent encounter for closed fracture with delayed healing: Secondary | ICD-10-CM | POA: Diagnosis not present

## 2016-01-18 DIAGNOSIS — R05 Cough: Secondary | ICD-10-CM | POA: Diagnosis not present

## 2016-01-18 DIAGNOSIS — J45909 Unspecified asthma, uncomplicated: Secondary | ICD-10-CM | POA: Diagnosis not present

## 2016-01-18 DIAGNOSIS — M7062 Trochanteric bursitis, left hip: Secondary | ICD-10-CM | POA: Diagnosis not present

## 2016-01-18 DIAGNOSIS — R609 Edema, unspecified: Secondary | ICD-10-CM | POA: Diagnosis not present

## 2016-02-12 DIAGNOSIS — L720 Epidermal cyst: Secondary | ICD-10-CM | POA: Diagnosis not present

## 2016-02-12 DIAGNOSIS — L570 Actinic keratosis: Secondary | ICD-10-CM | POA: Diagnosis not present

## 2016-02-12 DIAGNOSIS — Z85828 Personal history of other malignant neoplasm of skin: Secondary | ICD-10-CM | POA: Diagnosis not present

## 2016-02-12 DIAGNOSIS — L821 Other seborrheic keratosis: Secondary | ICD-10-CM | POA: Diagnosis not present

## 2016-06-12 DIAGNOSIS — M7062 Trochanteric bursitis, left hip: Secondary | ICD-10-CM | POA: Diagnosis not present

## 2016-07-17 ENCOUNTER — Telehealth: Payer: Self-pay | Admitting: Pulmonary Disease

## 2016-07-17 NOTE — Telephone Encounter (Signed)
Rec'd from Dr. Richmond Campbell MD forward 33 pages to [facility]

## 2016-07-25 DIAGNOSIS — M7062 Trochanteric bursitis, left hip: Secondary | ICD-10-CM | POA: Diagnosis not present

## 2016-07-25 DIAGNOSIS — Z4789 Encounter for other orthopedic aftercare: Secondary | ICD-10-CM | POA: Diagnosis not present

## 2016-07-25 DIAGNOSIS — S72142G Displaced intertrochanteric fracture of left femur, subsequent encounter for closed fracture with delayed healing: Secondary | ICD-10-CM | POA: Diagnosis not present

## 2016-08-11 DIAGNOSIS — Z23 Encounter for immunization: Secondary | ICD-10-CM | POA: Diagnosis not present

## 2016-08-12 DIAGNOSIS — H43813 Vitreous degeneration, bilateral: Secondary | ICD-10-CM | POA: Diagnosis not present

## 2016-08-12 DIAGNOSIS — H52203 Unspecified astigmatism, bilateral: Secondary | ICD-10-CM | POA: Diagnosis not present

## 2016-08-12 DIAGNOSIS — H01001 Unspecified blepharitis right upper eyelid: Secondary | ICD-10-CM | POA: Diagnosis not present

## 2016-08-12 DIAGNOSIS — Z961 Presence of intraocular lens: Secondary | ICD-10-CM | POA: Diagnosis not present

## 2016-08-14 DIAGNOSIS — C44519 Basal cell carcinoma of skin of other part of trunk: Secondary | ICD-10-CM | POA: Diagnosis not present

## 2016-08-14 DIAGNOSIS — D485 Neoplasm of uncertain behavior of skin: Secondary | ICD-10-CM | POA: Diagnosis not present

## 2016-08-14 DIAGNOSIS — L570 Actinic keratosis: Secondary | ICD-10-CM | POA: Diagnosis not present

## 2016-08-14 DIAGNOSIS — Z85828 Personal history of other malignant neoplasm of skin: Secondary | ICD-10-CM | POA: Diagnosis not present

## 2016-08-14 DIAGNOSIS — D1801 Hemangioma of skin and subcutaneous tissue: Secondary | ICD-10-CM | POA: Diagnosis not present

## 2016-08-14 DIAGNOSIS — L814 Other melanin hyperpigmentation: Secondary | ICD-10-CM | POA: Diagnosis not present

## 2016-08-14 DIAGNOSIS — L821 Other seborrheic keratosis: Secondary | ICD-10-CM | POA: Diagnosis not present

## 2016-08-26 DIAGNOSIS — E784 Other hyperlipidemia: Secondary | ICD-10-CM | POA: Diagnosis not present

## 2016-08-26 DIAGNOSIS — M810 Age-related osteoporosis without current pathological fracture: Secondary | ICD-10-CM | POA: Diagnosis not present

## 2016-08-26 DIAGNOSIS — I10 Essential (primary) hypertension: Secondary | ICD-10-CM | POA: Diagnosis not present

## 2016-08-26 DIAGNOSIS — Z125 Encounter for screening for malignant neoplasm of prostate: Secondary | ICD-10-CM | POA: Diagnosis not present

## 2016-08-29 DIAGNOSIS — G4709 Other insomnia: Secondary | ICD-10-CM | POA: Diagnosis not present

## 2016-08-29 DIAGNOSIS — G2581 Restless legs syndrome: Secondary | ICD-10-CM | POA: Diagnosis not present

## 2016-08-29 DIAGNOSIS — M1990 Unspecified osteoarthritis, unspecified site: Secondary | ICD-10-CM | POA: Diagnosis not present

## 2016-08-29 DIAGNOSIS — Z6822 Body mass index (BMI) 22.0-22.9, adult: Secondary | ICD-10-CM | POA: Diagnosis not present

## 2016-08-29 DIAGNOSIS — K219 Gastro-esophageal reflux disease without esophagitis: Secondary | ICD-10-CM | POA: Diagnosis not present

## 2016-08-29 DIAGNOSIS — Z1389 Encounter for screening for other disorder: Secondary | ICD-10-CM | POA: Diagnosis not present

## 2016-08-29 DIAGNOSIS — K900 Celiac disease: Secondary | ICD-10-CM | POA: Diagnosis not present

## 2016-08-29 DIAGNOSIS — M810 Age-related osteoporosis without current pathological fracture: Secondary | ICD-10-CM | POA: Diagnosis not present

## 2016-08-29 DIAGNOSIS — Z0000 Encounter for general adult medical examination without abnormal findings: Secondary | ICD-10-CM | POA: Diagnosis not present

## 2016-08-29 DIAGNOSIS — I10 Essential (primary) hypertension: Secondary | ICD-10-CM | POA: Diagnosis not present

## 2016-08-29 DIAGNOSIS — E784 Other hyperlipidemia: Secondary | ICD-10-CM | POA: Diagnosis not present

## 2016-08-29 DIAGNOSIS — R600 Localized edema: Secondary | ICD-10-CM | POA: Diagnosis not present

## 2016-09-23 ENCOUNTER — Ambulatory Visit (INDEPENDENT_AMBULATORY_CARE_PROVIDER_SITE_OTHER): Payer: Medicare Other | Admitting: Urology

## 2016-09-23 DIAGNOSIS — N5201 Erectile dysfunction due to arterial insufficiency: Secondary | ICD-10-CM | POA: Diagnosis not present

## 2016-09-23 DIAGNOSIS — N401 Enlarged prostate with lower urinary tract symptoms: Secondary | ICD-10-CM

## 2016-10-22 DIAGNOSIS — L570 Actinic keratosis: Secondary | ICD-10-CM | POA: Diagnosis not present

## 2016-10-22 DIAGNOSIS — S72142G Displaced intertrochanteric fracture of left femur, subsequent encounter for closed fracture with delayed healing: Secondary | ICD-10-CM | POA: Diagnosis not present

## 2016-10-22 DIAGNOSIS — L821 Other seborrheic keratosis: Secondary | ICD-10-CM | POA: Diagnosis not present

## 2016-10-22 DIAGNOSIS — L814 Other melanin hyperpigmentation: Secondary | ICD-10-CM | POA: Diagnosis not present

## 2016-10-22 DIAGNOSIS — M25552 Pain in left hip: Secondary | ICD-10-CM | POA: Diagnosis not present

## 2016-10-22 DIAGNOSIS — L111 Transient acantholytic dermatosis [Grover]: Secondary | ICD-10-CM | POA: Diagnosis not present

## 2016-10-22 DIAGNOSIS — M7062 Trochanteric bursitis, left hip: Secondary | ICD-10-CM | POA: Diagnosis not present

## 2016-10-22 DIAGNOSIS — Z85828 Personal history of other malignant neoplasm of skin: Secondary | ICD-10-CM | POA: Diagnosis not present

## 2016-10-22 DIAGNOSIS — Z4789 Encounter for other orthopedic aftercare: Secondary | ICD-10-CM | POA: Diagnosis not present

## 2016-12-17 DIAGNOSIS — K900 Celiac disease: Secondary | ICD-10-CM | POA: Diagnosis not present

## 2016-12-17 DIAGNOSIS — R42 Dizziness and giddiness: Secondary | ICD-10-CM | POA: Diagnosis not present

## 2016-12-17 DIAGNOSIS — I951 Orthostatic hypotension: Secondary | ICD-10-CM | POA: Diagnosis not present

## 2016-12-17 DIAGNOSIS — N400 Enlarged prostate without lower urinary tract symptoms: Secondary | ICD-10-CM | POA: Diagnosis not present

## 2016-12-17 DIAGNOSIS — R5383 Other fatigue: Secondary | ICD-10-CM | POA: Diagnosis not present

## 2016-12-25 DIAGNOSIS — R42 Dizziness and giddiness: Secondary | ICD-10-CM | POA: Diagnosis not present

## 2016-12-25 DIAGNOSIS — R5383 Other fatigue: Secondary | ICD-10-CM | POA: Diagnosis not present

## 2016-12-25 DIAGNOSIS — I951 Orthostatic hypotension: Secondary | ICD-10-CM | POA: Diagnosis not present

## 2016-12-25 DIAGNOSIS — Z6823 Body mass index (BMI) 23.0-23.9, adult: Secondary | ICD-10-CM | POA: Diagnosis not present

## 2016-12-25 DIAGNOSIS — K900 Celiac disease: Secondary | ICD-10-CM | POA: Diagnosis not present

## 2016-12-25 DIAGNOSIS — N400 Enlarged prostate without lower urinary tract symptoms: Secondary | ICD-10-CM | POA: Diagnosis not present

## 2016-12-25 DIAGNOSIS — J309 Allergic rhinitis, unspecified: Secondary | ICD-10-CM | POA: Diagnosis not present

## 2016-12-25 DIAGNOSIS — R05 Cough: Secondary | ICD-10-CM | POA: Diagnosis not present

## 2017-02-25 DIAGNOSIS — N400 Enlarged prostate without lower urinary tract symptoms: Secondary | ICD-10-CM | POA: Diagnosis not present

## 2017-02-25 DIAGNOSIS — R05 Cough: Secondary | ICD-10-CM | POA: Diagnosis not present

## 2017-02-25 DIAGNOSIS — I10 Essential (primary) hypertension: Secondary | ICD-10-CM | POA: Diagnosis not present

## 2017-02-25 DIAGNOSIS — M1990 Unspecified osteoarthritis, unspecified site: Secondary | ICD-10-CM | POA: Diagnosis not present

## 2017-02-25 DIAGNOSIS — R609 Edema, unspecified: Secondary | ICD-10-CM | POA: Diagnosis not present

## 2017-02-25 DIAGNOSIS — Z6823 Body mass index (BMI) 23.0-23.9, adult: Secondary | ICD-10-CM | POA: Diagnosis not present

## 2017-02-25 DIAGNOSIS — Z1389 Encounter for screening for other disorder: Secondary | ICD-10-CM | POA: Diagnosis not present

## 2017-02-25 DIAGNOSIS — R42 Dizziness and giddiness: Secondary | ICD-10-CM | POA: Diagnosis not present

## 2017-03-02 DIAGNOSIS — L570 Actinic keratosis: Secondary | ICD-10-CM | POA: Diagnosis not present

## 2017-03-02 DIAGNOSIS — Z85828 Personal history of other malignant neoplasm of skin: Secondary | ICD-10-CM | POA: Diagnosis not present

## 2017-03-02 DIAGNOSIS — L821 Other seborrheic keratosis: Secondary | ICD-10-CM | POA: Diagnosis not present

## 2017-03-02 DIAGNOSIS — D1801 Hemangioma of skin and subcutaneous tissue: Secondary | ICD-10-CM | POA: Diagnosis not present

## 2017-03-03 ENCOUNTER — Encounter (HOSPITAL_COMMUNITY): Payer: Medicare Other | Admitting: Internal Medicine

## 2017-03-05 ENCOUNTER — Ambulatory Visit (HOSPITAL_COMMUNITY)
Admission: RE | Admit: 2017-03-05 | Discharge: 2017-03-05 | Disposition: A | Discharge status: Home / Self Care | Payer: Medicare Other | Source: Ambulatory Visit | Attending: Internal Medicine | Admitting: Internal Medicine

## 2017-03-05 ENCOUNTER — Encounter (HOSPITAL_COMMUNITY): Payer: Self-pay | Admitting: *Deleted

## 2017-03-05 ENCOUNTER — Encounter (HOSPITAL_COMMUNITY): Payer: Self-pay | Admitting: Internal Medicine

## 2017-03-05 VITALS — BP 126/52 | HR 68 | Wt 141.5 lb

## 2017-03-05 DIAGNOSIS — K219 Gastro-esophageal reflux disease without esophagitis: Secondary | ICD-10-CM | POA: Diagnosis not present

## 2017-03-05 DIAGNOSIS — I129 Hypertensive chronic kidney disease with stage 1 through stage 4 chronic kidney disease, or unspecified chronic kidney disease: Secondary | ICD-10-CM | POA: Diagnosis not present

## 2017-03-05 DIAGNOSIS — K589 Irritable bowel syndrome without diarrhea: Secondary | ICD-10-CM | POA: Diagnosis not present

## 2017-03-05 DIAGNOSIS — Z79899 Other long term (current) drug therapy: Secondary | ICD-10-CM | POA: Insufficient documentation

## 2017-03-05 DIAGNOSIS — I10 Essential (primary) hypertension: Secondary | ICD-10-CM | POA: Diagnosis not present

## 2017-03-05 DIAGNOSIS — R600 Localized edema: Secondary | ICD-10-CM

## 2017-03-05 DIAGNOSIS — K900 Celiac disease: Secondary | ICD-10-CM | POA: Insufficient documentation

## 2017-03-05 DIAGNOSIS — R0609 Other forms of dyspnea: Secondary | ICD-10-CM | POA: Diagnosis not present

## 2017-03-05 DIAGNOSIS — R0789 Other chest pain: Secondary | ICD-10-CM | POA: Insufficient documentation

## 2017-03-05 DIAGNOSIS — Z87891 Personal history of nicotine dependence: Secondary | ICD-10-CM | POA: Diagnosis not present

## 2017-03-05 DIAGNOSIS — R079 Chest pain, unspecified: Secondary | ICD-10-CM | POA: Diagnosis not present

## 2017-03-05 DIAGNOSIS — R0683 Snoring: Secondary | ICD-10-CM | POA: Diagnosis not present

## 2017-03-05 DIAGNOSIS — N189 Chronic kidney disease, unspecified: Secondary | ICD-10-CM | POA: Diagnosis not present

## 2017-03-05 NOTE — Patient Instructions (Addendum)
Your provider requests you have a sleep study.  Follow up with Dr.Bensimhon in 2-3 months  You have been scheduled for a right/left heart cath on May 31st. **please see cath instruction letter attached**

## 2017-03-05 NOTE — Progress Notes (Signed)
ADVANCED HF CLINIC CONSULT NOTE  Referring Physician: [facility]: [place]  Primary Cardiologist: None  HPI:  [name] is a 82 y/o male with h/o HTN, CKD, celiac disease referred by Dr. Cyndia Bent for further evaluation of LE edema and DOE.   In 2010 broke his back after falling out of a hammock. Pre-op studies showed.   2010 Myoview EF 64% ? Inferior ischemia. Followed by cath with reportedly normal coronaries 2010 Echo LV and RV normal  About a year ago began to have LE edema. Was placed on diuretic and it resolved. About a month ago LE swelling returned. Has intermittent DOE. No problem with ADLs but if he does more he gets pressure in his chest and SOB. No weight change in recent months. No orthopnea or PND. No severe episodes of CP or pressures. No smoking in 40 years.   Snores severely with episodes of apnea.     Review of Systems: [y] = yes, [ ]  = no   General: Weight gain [ ] ; Weight loss [ ] ; Anorexia [ ] ; Fatigue Blue.Reese ]; Fever [ ] ; Chills [ ] ; Weakness [ ]   Cardiac: Chest pain/pressure Blue.Reese ]; Resting SOB [ ] ; Exertional SOB Blue.Reese ]; Orthopnea [ ] ; Pedal Edema [ ] ; Palpitations [ ] ; Syncope [ ] ; Presyncope [ ] ; Paroxysmal nocturnal dyspnea[ ]   Pulmonary: Cough [ ] ; Wheezing[ ] ; Hemoptysis[ ] ; Sputum [ ] ; Snoring Blue.Reese ]  GI: Vomiting[ ] ; Dysphagia[ ] ; Melena[ ] ; Hematochezia [ ] ; Heartburn[ ] ; Abdominal pain [ ] ; Constipation [ ] ; Diarrhea [ ] ; BRBPR [ ]   GU: Hematuria[ ] ; Dysuria [ ] ; Nocturia[ ]   Vascular: Pain in legs with walking [ ] ; Pain in feet with lying flat [ ] ; Non-healing sores [ ] ; Stroke [ ] ; TIA [ ] ; Slurred speech [ ] ;  Neuro: Headaches[ ] ; Vertigo[ ] ; Seizures[ ] ; Paresthesias[ ] ;Blurred vision [ ] ; Diplopia [ ] ; Vision changes [ ]   Ortho/Skin: Arthritis Blue.Reese ]; Joint pain Blue.Reese ]; Muscle pain [ ] ; Joint swelling [ ] ; Back Pain [ y]; Rash [ ]   Psych: Depression[ ] ; Anxiety[ ]   Heme: Bleeding problems [ ] ; Clotting disorders [ ] ; Anemia [ ]   Endocrine: Diabetes [  ]; Thyroid dysfunction[ ]    Past Medical History:  Diagnosis Date  . Arthritis    osteoarthritis  back  . CD (celiac disease)    followed by dr Earlean Shawl  . Chronic kidney disease   . Enlarged prostate    followed by dr  Dorina Hoyer  . Essential hypertension, benign 03/29/2014  . Full dentures   . GERD (gastroesophageal reflux disease)   . Hemorrhoids   . Hiatal hernia   . IBS (irritable bowel syndrome)   . Osteoporosis   . Postoperative anemia due to acute blood loss 03/29/2014  . Ulcer     Current Outpatient Prescriptions  Medication Sig Dispense Refill  . albuterol (PROVENTIL HFA;VENTOLIN HFA) 108 (90 Base) MCG/ACT inhaler Inhale 2 puffs into the lungs every 6 (six) hours as needed for wheezing or shortness of breath.    Marland Kitchen alendronate (FOSAMAX) 70 MG tablet Take 70 mg by mouth once a week. Take with a full glass of water on an empty stomach.    Marland Kitchen amLODipine (NORVASC) 5 MG tablet Take 1 tablet (5 mg total) by mouth daily. 30 tablet 3  . azelastine (ASTELIN) 0.1 % nasal spray Place 1 spray into both nostrils 2 (two) times daily. Use in each nostril as directed    .  cholecalciferol (VITAMIN D) 1000 UNITS tablet Take 1,000 Units by mouth daily.      Marland Kitchen docusate sodium 100 MG CAPS Take 100 mg by mouth 2 (two) times daily. 10 capsule 0  . doxazosin (CARDURA) 4 MG tablet Take 2 mg by mouth at bedtime.     . ferrous sulfate 325 (65 FE) MG tablet Take 1 tablet (325 mg total) by mouth 3 (three) times daily after meals. 90 tablet 1  . fexofenadine (ALLEGRA) 180 MG tablet Take 180 mg by mouth daily.      . furosemide (LASIX) 40 MG tablet Take 40 mg by mouth daily.    Marland Kitchen HYDROcodone-acetaminophen (NORCO) 5-325 MG per tablet Take 1-2 tablets by mouth every 6 (six) hours as needed. 90 tablet 0  . meloxicam (MOBIC) 15 MG tablet Take 15 mg by mouth daily.      . montelukast (SINGULAIR) 10 MG tablet Take 10 mg by mouth at bedtime.      . Multiple Vitamin (MULTIVITAMIN) tablet Take 1 tablet by mouth  daily.     Marland Kitchen omeprazole (PRILOSEC) 40 MG capsule Take 40 mg by mouth 2 (two) times daily.     . polyethylene glycol (MIRALAX / GLYCOLAX) packet Take 17 g by mouth daily as needed for mild constipation. 14 each 0  . ranitidine (ZANTAC) 150 MG tablet Take 150 mg by mouth 2 (two) times daily.    Marland Kitchen rOPINIRole (REQUIP) 1 MG tablet Take 1 mg by mouth at bedtime.     Marland Kitchen tiZANidine (ZANAFLEX) 4 MG tablet Take 2-4 mg by mouth at bedtime as needed. For pain; 1 -2 tablets depending on pain level    . zolpidem (AMBIEN) 5 MG tablet Take 1 tablet (5 mg total) by mouth at bedtime as needed. For sleep 30 tablet 0  . calcium carbonate 200 MG capsule Take 250 mg by mouth 2 (two) times daily with a meal.       No current facility-administered medications for this encounter.     No Known Allergies    Social History   Social History  . Marital status: Married    Spouse name: N/A  . Number of children: N/A  . Years of education: N/A   Occupational History  . Not on file.   Social History Main Topics  . Smoking status: Former Smoker    Quit date: 08/11/1981  . Smokeless tobacco: Never Used  . Alcohol use Yes     Comment: beer- socially  . Drug use: No  . Sexual activity: Not on file   Other Topics Concern  . Not on file   Social History Narrative  . No narrative on file      Family History  Problem Relation Age of Onset  . Cancer Mother        cervical  . Stroke Father   . Cancer Sister        breast  . Cancer Brother        throat  . Cancer Brother        liver  . Cancer Brother        brain    Vitals:   03/05/17 1133  BP: (!) 126/52  Pulse: 68  SpO2: 100%  Weight: 141 lb 8 oz (64.2 kg)    PHYSICAL EXAM: General:  Well appearing. No respiratory difficulty HEENT: normal Neck: supple. no JVD. Carotids 2+ bilat; no bruits. No lymphadenopathy or thryomegaly appreciated. Cor: PMI nondisplaced. Regular rate & rhythm. No rubs,  gallops or murmurs. Lungs: clear Abdomen: soft,  nontender, nondistended. No hepatosplenomegaly. No bruits or masses. Good bowel sounds. Extremities: no cyanosis, clubbing, rash, edema Neuro: alert & oriented x 3, cranial nerves grossly intact. moves all 4 extremities w/o difficulty. Affect pleasant.  ECG: NSR 65 early repol  ASSESSMENT & PLAN: 1. Exertional dyspnea and LE edema -- Suspect he may have a component of diastolic HF versus elevated R-sided pressures in setting of undiagnosed OSA. However volume status currently looks good on exam. ReDS lung volume 29% (normal 20-35%) also looks good.  -- I performed bedside echo and EF 60%. RV normal. No obvious valvular disease -- Continue lasix 40 daily. Can take extra 62m as needed.  -- Formal echo ordered  2. Exertional chest pressure -- May be related to volume -- Previously had false + Myoview with normal f/u cath in 2010 -- Will repeat R/L heart cath to ensure he has not developed interval CAD and also to measure pulmonary pressures  3. Severe snoring -- His wife gives a classic history for sleep apnea. Given that he is not obese may have significant component of central apnea. Will refer for sleep study.    BGlori Bickers MD  9:05 PM

## 2017-03-05 NOTE — Progress Notes (Unsigned)
    ReDS Vest - 03/05/17 1200      ReDS Vest   MR  No   Fitting Posture Standing   Height Marker Tall   Ruler Value 12   Center Strip Aligned   ReDS Value 29

## 2017-03-10 ENCOUNTER — Other Ambulatory Visit (HOSPITAL_COMMUNITY): Payer: Self-pay | Admitting: *Deleted

## 2017-03-10 DIAGNOSIS — I5022 Chronic systolic (congestive) heart failure: Secondary | ICD-10-CM

## 2017-03-12 ENCOUNTER — Ambulatory Visit (HOSPITAL_COMMUNITY)
Admission: RE | Admit: 2017-03-12 | Discharge: 2017-03-12 | Disposition: A | Discharge status: Home / Self Care | Payer: Medicare Other | Source: Ambulatory Visit | Attending: Internal Medicine | Admitting: Internal Medicine

## 2017-03-12 ENCOUNTER — Encounter (HOSPITAL_COMMUNITY): Payer: Self-pay | Admitting: Cardiology

## 2017-03-12 ENCOUNTER — Encounter (HOSPITAL_COMMUNITY)
Admission: RE | Disposition: A | Discharge status: Home / Self Care | Payer: Self-pay | Source: Ambulatory Visit | Attending: Internal Medicine

## 2017-03-12 DIAGNOSIS — I5022 Chronic systolic (congestive) heart failure: Secondary | ICD-10-CM

## 2017-03-12 DIAGNOSIS — N189 Chronic kidney disease, unspecified: Secondary | ICD-10-CM | POA: Diagnosis not present

## 2017-03-12 DIAGNOSIS — Z87891 Personal history of nicotine dependence: Secondary | ICD-10-CM | POA: Insufficient documentation

## 2017-03-12 DIAGNOSIS — R0609 Other forms of dyspnea: Secondary | ICD-10-CM | POA: Insufficient documentation

## 2017-03-12 DIAGNOSIS — R600 Localized edema: Secondary | ICD-10-CM | POA: Diagnosis not present

## 2017-03-12 DIAGNOSIS — M810 Age-related osteoporosis without current pathological fracture: Secondary | ICD-10-CM | POA: Insufficient documentation

## 2017-03-12 DIAGNOSIS — I130 Hypertensive heart and chronic kidney disease with heart failure and stage 1 through stage 4 chronic kidney disease, or unspecified chronic kidney disease: Secondary | ICD-10-CM | POA: Insufficient documentation

## 2017-03-12 DIAGNOSIS — I509 Heart failure, unspecified: Secondary | ICD-10-CM | POA: Diagnosis not present

## 2017-03-12 DIAGNOSIS — K900 Celiac disease: Secondary | ICD-10-CM | POA: Insufficient documentation

## 2017-03-12 DIAGNOSIS — K219 Gastro-esophageal reflux disease without esophagitis: Secondary | ICD-10-CM | POA: Diagnosis not present

## 2017-03-12 DIAGNOSIS — R0789 Other chest pain: Secondary | ICD-10-CM | POA: Diagnosis not present

## 2017-03-12 DIAGNOSIS — N400 Enlarged prostate without lower urinary tract symptoms: Secondary | ICD-10-CM | POA: Insufficient documentation

## 2017-03-12 DIAGNOSIS — M1990 Unspecified osteoarthritis, unspecified site: Secondary | ICD-10-CM | POA: Diagnosis not present

## 2017-03-12 DIAGNOSIS — K589 Irritable bowel syndrome without diarrhea: Secondary | ICD-10-CM | POA: Diagnosis not present

## 2017-03-12 HISTORY — PX: RIGHT/LEFT HEART CATH AND CORONARY ANGIOGRAPHY: CATH118266

## 2017-03-12 LAB — PROTIME-INR
INR: 1.01
Prothrombin Time: 13.3 seconds (ref 11.4–15.2)

## 2017-03-12 LAB — POCT I-STAT 3, VENOUS BLOOD GAS (G3P V)
ACID-BASE DEFICIT: 6 mmol/L — AB (ref 0.0–2.0)
Acid-Base Excess: 1 mmol/L (ref 0.0–2.0)
Bicarbonate: 19.8 mmol/L — ABNORMAL LOW (ref 20.0–28.0)
Bicarbonate: 26.6 mmol/L (ref 20.0–28.0)
O2 Saturation: 70 %
O2 Saturation: 74 %
PCO2 VEN: 45.8 mmHg (ref 44.0–60.0)
PH VEN: 7.29 (ref 7.250–7.430)
PH VEN: 7.372 (ref 7.250–7.430)
PO2 VEN: 41 mmHg (ref 32.0–45.0)
TCO2: 21 mmol/L (ref 0–100)
TCO2: 28 mmol/L (ref 0–100)
pCO2, Ven: 41.3 mmHg — ABNORMAL LOW (ref 44.0–60.0)
pO2, Ven: 41 mmHg (ref 32.0–45.0)

## 2017-03-12 LAB — POCT I-STAT 3, ART BLOOD GAS (G3+)
Acid-base deficit: 2 mmol/L (ref 0.0–2.0)
BICARBONATE: 22.2 mmol/L (ref 20.0–28.0)
O2 Saturation: 97 %
PCO2 ART: 36 mmHg (ref 32.0–48.0)
PH ART: 7.398 (ref 7.350–7.450)
PO2 ART: 92 mmHg (ref 83.0–108.0)
TCO2: 23 mmol/L (ref 0–100)

## 2017-03-12 LAB — CBC
HEMATOCRIT: 36 % — AB (ref 39.0–52.0)
Hemoglobin: 11.6 g/dL — ABNORMAL LOW (ref 13.0–17.0)
MCH: 28.6 pg (ref 26.0–34.0)
MCHC: 32.2 g/dL (ref 30.0–36.0)
MCV: 88.7 fL (ref 78.0–100.0)
Platelets: 195 10*3/uL (ref 150–400)
RBC: 4.06 MIL/uL — ABNORMAL LOW (ref 4.22–5.81)
RDW: 13.3 % (ref 11.5–15.5)
WBC: 7.5 10*3/uL (ref 4.0–10.5)

## 2017-03-12 LAB — BASIC METABOLIC PANEL
Anion gap: 9 (ref 5–15)
BUN: 15 mg/dL (ref 6–20)
CHLORIDE: 103 mmol/L (ref 101–111)
CO2: 26 mmol/L (ref 22–32)
Calcium: 9.1 mg/dL (ref 8.9–10.3)
Creatinine, Ser: 1.34 mg/dL — ABNORMAL HIGH (ref 0.61–1.24)
GFR calc Af Amer: 56 mL/min — ABNORMAL LOW (ref >60)
GFR calc non Af Amer: 49 mL/min — ABNORMAL LOW (ref >60)
GLUCOSE: 92 mg/dL (ref 65–99)
POTASSIUM: 3.8 mmol/L (ref 3.5–5.1)
Sodium: 138 mmol/L (ref 135–145)

## 2017-03-12 SURGERY — RIGHT/LEFT HEART CATH AND CORONARY ANGIOGRAPHY
Anesthesia: LOCAL

## 2017-03-12 MED ORDER — SODIUM CHLORIDE 0.9 % IV SOLN: INTRAVENOUS | Status: AC

## 2017-03-12 MED ORDER — LIDOCAINE HCL 1 % IJ SOLN
INTRAMUSCULAR | Status: AC
  Filled 2017-03-12: qty 20

## 2017-03-12 MED ORDER — ASPIRIN 81 MG PO CHEW
81.0000 mg | CHEWABLE_TABLET | ORAL | Status: AC
  Administered 2017-03-12: 81 mg via ORAL

## 2017-03-12 MED ORDER — ASPIRIN 81 MG PO CHEW
CHEWABLE_TABLET | ORAL | Status: AC
  Administered 2017-03-12: 81 mg via ORAL
  Filled 2017-03-12: qty 1

## 2017-03-12 MED ORDER — IOPAMIDOL (ISOVUE-370) INJECTION 76%
INTRAVENOUS | Status: DC | PRN
  Administered 2017-03-12: 50 mL via INTRA_ARTERIAL

## 2017-03-12 MED ORDER — SODIUM CHLORIDE 0.9% FLUSH: 3.0000 mL | Freq: Two times a day (BID) | INTRAVENOUS | Status: DC

## 2017-03-12 MED ORDER — IOPAMIDOL (ISOVUE-370) INJECTION 76%
INTRAVENOUS | Status: AC
  Filled 2017-03-12: qty 100

## 2017-03-12 MED ORDER — MIDAZOLAM HCL 2 MG/2ML IJ SOLN
INTRAMUSCULAR | Status: AC
  Filled 2017-03-12: qty 2

## 2017-03-12 MED ORDER — FENTANYL CITRATE (PF) 100 MCG/2ML IJ SOLN
INTRAMUSCULAR | Status: AC
  Filled 2017-03-12: qty 2

## 2017-03-12 MED ORDER — HEPARIN SODIUM (PORCINE) 1000 UNIT/ML IJ SOLN
INTRAMUSCULAR | Status: AC
  Filled 2017-03-12: qty 1

## 2017-03-12 MED ORDER — HEPARIN SODIUM (PORCINE) 1000 UNIT/ML IJ SOLN
INTRAMUSCULAR | Status: DC | PRN
  Administered 2017-03-12: 3000 [IU] via INTRAVENOUS

## 2017-03-12 MED ORDER — SODIUM CHLORIDE 0.9 % IV SOLN: 250.0000 mL | INTRAVENOUS | Status: DC | PRN

## 2017-03-12 MED ORDER — HEPARIN (PORCINE) IN NACL 2-0.9 UNIT/ML-% IJ SOLN
INTRAMUSCULAR | Status: AC | PRN
  Administered 2017-03-12: 1000 mL

## 2017-03-12 MED ORDER — MIDAZOLAM HCL 2 MG/2ML IJ SOLN
INTRAMUSCULAR | Status: DC | PRN
Start: 2017-03-12 — End: 2017-03-12
  Administered 2017-03-12 (×3): 1 mg via INTRAVENOUS

## 2017-03-12 MED ORDER — HEPARIN (PORCINE) IN NACL 2-0.9 UNIT/ML-% IJ SOLN
INTRAMUSCULAR | Status: AC
  Filled 2017-03-12: qty 1000

## 2017-03-12 MED ORDER — SODIUM CHLORIDE 0.9% FLUSH: 3.0000 mL | INTRAVENOUS | Status: DC | PRN

## 2017-03-12 MED ORDER — VERAPAMIL HCL 2.5 MG/ML IV SOLN
INTRAVENOUS | Status: AC
  Filled 2017-03-12: qty 2

## 2017-03-12 MED ORDER — ONDANSETRON HCL 4 MG/2ML IJ SOLN: 4.0000 mg | Freq: Four times a day (QID) | INTRAMUSCULAR | Status: DC | PRN

## 2017-03-12 MED ORDER — LIDOCAINE HCL (PF) 1 % IJ SOLN
INTRAMUSCULAR | Status: DC | PRN
  Administered 2017-03-12: 11 mL
  Administered 2017-03-12 (×2): 5 mL

## 2017-03-12 MED ORDER — FENTANYL CITRATE (PF) 100 MCG/2ML IJ SOLN
INTRAMUSCULAR | Status: DC | PRN
  Administered 2017-03-12: 25 ug via INTRAVENOUS

## 2017-03-12 MED ORDER — SODIUM CHLORIDE 0.9 % IV SOLN
INTRAVENOUS | Status: DC
  Administered 2017-03-12: 08:00:00 via INTRAVENOUS

## 2017-03-12 MED ORDER — ACETAMINOPHEN 325 MG PO TABS: 650.0000 mg | ORAL_TABLET | ORAL | Status: DC | PRN

## 2017-03-12 MED ORDER — VERAPAMIL HCL 2.5 MG/ML IV SOLN
INTRAVENOUS | Status: DC | PRN
  Administered 2017-03-12: 10 mL via INTRA_ARTERIAL

## 2017-03-12 SURGICAL SUPPLY — 13 items
CATH BALLN WEDGE 5F 110CM (CATHETERS) ×2 IMPLANT
CATH INFINITI 5FR MULTPACK ANG (CATHETERS) ×2 IMPLANT
DEVICE RAD COMP TR BAND LRG (VASCULAR PRODUCTS) ×2 IMPLANT
GLIDESHEATH SLEND SS 6F .021 (SHEATH) ×2 IMPLANT
GUIDEWIRE INQWIRE 1.5J.035X260 (WIRE) ×1 IMPLANT
INQWIRE 1.5J .035X260CM (WIRE) ×2
KIT HEART LEFT (KITS) ×2 IMPLANT
PACK CARDIAC CATHETERIZATION (CUSTOM PROCEDURE TRAY) ×2 IMPLANT
SHEATH GLIDE SLENDER 4/5FR (SHEATH) ×2 IMPLANT
TRANSDUCER W/STOPCOCK (MISCELLANEOUS) ×2 IMPLANT
TUBING CIL FLEX 10 FLL-RA (TUBING) ×2 IMPLANT
WIRE HI TORQ VERSACORE-J 145CM (WIRE) ×2 IMPLANT
WIRE MICROINTRODUCER 60CM (WIRE) ×8 IMPLANT

## 2017-03-12 NOTE — Interval H&P Note (Signed)
History and Physical Interval Note:  03/12/2017 9:23 AM  [name]  has presented today for surgery, with the diagnosis of chf  The various methods of treatment have been discussed with the patient and family. After consideration of risks, benefits and other options for treatment, the patient has consented to  Procedure(s): Right/Left Heart Cath and Coronary Angiography (N/A) and possible angioplasty as a surgical intervention .  The patient's history has been reviewed, patient examined, no change in status, stable for surgery.  I have reviewed the patient's chart and labs.  Questions were answered to the patient's satisfaction.     Sharanda Shinault, Quillian Quince

## 2017-03-12 NOTE — Discharge Instructions (Signed)

## 2017-03-12 NOTE — H&P (View-Only) (Signed)
ADVANCED HF CLINIC CONSULT NOTE  Referring Physician: Bartle Primary Care: [name]  Primary Cardiologist: None  HPI:  [name] is a 82 y/o male with h/o HTN, CKD, celiac disease referred by Dr. Cyndia Bent for further evaluation of LE edema and DOE.   In 2010 broke his back after falling out of a hammock. Pre-op studies showed.   2010 Myoview EF 64% ? Inferior ischemia. Followed by cath with reportedly normal coronaries 2010 Echo LV and RV normal  About a year ago began to have LE edema. Was placed on diuretic and it resolved. About a month ago LE swelling returned. Has intermittent DOE. No problem with ADLs but if he does more he gets pressure in his chest and SOB. No weight change in recent months. No orthopnea or PND. No severe episodes of CP or pressures. No smoking in 40 years.   Snores severely with episodes of apnea.     Review of Systems: [y] = yes, [ ]  = no   General: Weight gain [ ] ; Weight loss [ ] ; Anorexia [ ] ; Fatigue Blue.Reese ]; Fever [ ] ; Chills [ ] ; Weakness [ ]   Cardiac: Chest pain/pressure Blue.Reese ]; Resting SOB [ ] ; Exertional SOB Blue.Reese ]; Orthopnea [ ] ; Pedal Edema [ ] ; Palpitations [ ] ; Syncope [ ] ; Presyncope [ ] ; Paroxysmal nocturnal dyspnea[ ]   Pulmonary: Cough [ ] ; Wheezing[ ] ; Hemoptysis[ ] ; Sputum [ ] ; Snoring Blue.Reese ]  GI: Vomiting[ ] ; Dysphagia[ ] ; Melena[ ] ; Hematochezia [ ] ; Heartburn[ ] ; Abdominal pain [ ] ; Constipation [ ] ; Diarrhea [ ] ; BRBPR [ ]   GU: Hematuria[ ] ; Dysuria [ ] ; Nocturia[ ]   Vascular: Pain in legs with walking [ ] ; Pain in feet with lying flat [ ] ; Non-healing sores [ ] ; Stroke [ ] ; TIA [ ] ; Slurred speech [ ] ;  Neuro: Headaches[ ] ; Vertigo[ ] ; Seizures[ ] ; Paresthesias[ ] ;Blurred vision [ ] ; Diplopia [ ] ; Vision changes [ ]   Ortho/Skin: Arthritis Blue.Reese ]; Joint pain Blue.Reese ]; Muscle pain [ ] ; Joint swelling [ ] ; Back Pain [ y]; Rash [ ]   Psych: Depression[ ] ; Anxiety[ ]   Heme: Bleeding problems [ ] ; Clotting disorders [ ] ; Anemia [ ]   Endocrine: Diabetes [  ]; Thyroid dysfunction[ ]    Past Medical History:  Diagnosis Date  . Arthritis    osteoarthritis  back  . CD (celiac disease)    followed by dr Earlean Shawl  . Chronic kidney disease   . Enlarged prostate    followed by dr  Dorina Hoyer  . Essential hypertension, benign 03/29/2014  . Full dentures   . GERD (gastroesophageal reflux disease)   . Hemorrhoids   . Hiatal hernia   . IBS (irritable bowel syndrome)   . Osteoporosis   . Postoperative anemia due to acute blood loss 03/29/2014  . Ulcer     Current Outpatient Prescriptions  Medication Sig Dispense Refill  . albuterol (PROVENTIL HFA;VENTOLIN HFA) 108 (90 Base) MCG/ACT inhaler Inhale 2 puffs into the lungs every 6 (six) hours as needed for wheezing or shortness of breath.    Marland Kitchen alendronate (FOSAMAX) 70 MG tablet Take 70 mg by mouth once a week. Take with a full glass of water on an empty stomach.    Marland Kitchen amLODipine (NORVASC) 5 MG tablet Take 1 tablet (5 mg total) by mouth daily. 30 tablet 3  . azelastine (ASTELIN) 0.1 % nasal spray Place 1 spray into both nostrils 2 (two) times daily. Use in each nostril as directed    .  cholecalciferol (VITAMIN D) 1000 UNITS tablet Take 1,000 Units by mouth daily.      Marland Kitchen docusate sodium 100 MG CAPS Take 100 mg by mouth 2 (two) times daily. 10 capsule 0  . doxazosin (CARDURA) 4 MG tablet Take 2 mg by mouth at bedtime.     . ferrous sulfate 325 (65 FE) MG tablet Take 1 tablet (325 mg total) by mouth 3 (three) times daily after meals. 90 tablet 1  . fexofenadine (ALLEGRA) 180 MG tablet Take 180 mg by mouth daily.      . furosemide (LASIX) 40 MG tablet Take 40 mg by mouth daily.    Marland Kitchen HYDROcodone-acetaminophen (NORCO) 5-325 MG per tablet Take 1-2 tablets by mouth every 6 (six) hours as needed. 90 tablet 0  . meloxicam (MOBIC) 15 MG tablet Take 15 mg by mouth daily.      . montelukast (SINGULAIR) 10 MG tablet Take 10 mg by mouth at bedtime.      . Multiple Vitamin (MULTIVITAMIN) tablet Take 1 tablet by mouth  daily.     Marland Kitchen omeprazole (PRILOSEC) 40 MG capsule Take 40 mg by mouth 2 (two) times daily.     . polyethylene glycol (MIRALAX / GLYCOLAX) packet Take 17 g by mouth daily as needed for mild constipation. 14 each 0  . ranitidine (ZANTAC) 150 MG tablet Take 150 mg by mouth 2 (two) times daily.    Marland Kitchen rOPINIRole (REQUIP) 1 MG tablet Take 1 mg by mouth at bedtime.     Marland Kitchen tiZANidine (ZANAFLEX) 4 MG tablet Take 2-4 mg by mouth at bedtime as needed. For pain; 1 -2 tablets depending on pain level    . zolpidem (AMBIEN) 5 MG tablet Take 1 tablet (5 mg total) by mouth at bedtime as needed. For sleep 30 tablet 0  . calcium carbonate 200 MG capsule Take 250 mg by mouth 2 (two) times daily with a meal.       No current facility-administered medications for this encounter.     No Known Allergies    Social History   Social History  . Marital status: Married    Spouse name: N/A  . Number of children: N/A  . Years of education: N/A   Occupational History  . Not on file.   Social History Main Topics  . Smoking status: Former Smoker    Quit date: 08/11/1981  . Smokeless tobacco: Never Used  . Alcohol use Yes     Comment: beer- socially  . Drug use: No  . Sexual activity: Not on file   Other Topics Concern  . Not on file   Social History Narrative  . No narrative on file      Family History  Problem Relation Age of Onset  . Cancer Mother        cervical  . Stroke Father   . Cancer Sister        breast  . Cancer Brother        throat  . Cancer Brother        liver  . Cancer Brother        brain    Vitals:   03/05/17 1133  BP: (!) 126/52  Pulse: 68  SpO2: 100%  Weight: 141 lb 8 oz (64.2 kg)    PHYSICAL EXAM: General:  Well appearing. No respiratory difficulty HEENT: normal Neck: supple. no JVD. Carotids 2+ bilat; no bruits. No lymphadenopathy or thryomegaly appreciated. Cor: PMI nondisplaced. Regular rate & rhythm. No rubs,  gallops or murmurs. Lungs: clear Abdomen: soft,  nontender, nondistended. No hepatosplenomegaly. No bruits or masses. Good bowel sounds. Extremities: no cyanosis, clubbing, rash, edema Neuro: alert & oriented x 3, cranial nerves grossly intact. moves all 4 extremities w/o difficulty. Affect pleasant.  ECG: NSR 65 early repol  ASSESSMENT & PLAN: 1. Exertional dyspnea and LE edema -- Suspect he may have a component of diastolic HF versus elevated R-sided pressures in setting of undiagnosed OSA. However volume status currently looks good on exam. ReDS lung volume 29% (normal 20-35%) also looks good.  -- I performed bedside echo and EF 60%. RV normal. No obvious valvular disease -- Continue lasix 40 daily. Can take extra 63m as needed.  -- Formal echo ordered  2. Exertional chest pressure -- May be related to volume -- Previously had false + Myoview with normal f/u cath in 2010 -- Will repeat R/L heart cath to ensure he has not developed interval CAD and also to measure pulmonary pressures  3. Severe snoring -- His wife gives a classic history for sleep apnea. Given that he is not obese may have significant component of central apnea. Will refer for sleep study.    BGlori Bickers MD  9:05 PM

## 2017-03-12 NOTE — Progress Notes (Signed)
Site area: right brachial  Site Prior to Removal:  Level 0  Pressure Applied For 15  MINUTES    Minutes Beginning at 1130  Manual:   yes  Patient Status During Pull:  goof  Post Pull Site 0  Post Pull Instructions Given:  yes  Post Pull Pulses Present:  yes  Dressing Applied: yes  Comments:  Tolerated procedure well

## 2017-03-16 ENCOUNTER — Ambulatory Visit (HOSPITAL_BASED_OUTPATIENT_CLINIC_OR_DEPARTMENT_OTHER): Payer: Medicare Other | Attending: Internal Medicine | Admitting: Cardiology

## 2017-03-16 DIAGNOSIS — I10 Essential (primary) hypertension: Secondary | ICD-10-CM | POA: Insufficient documentation

## 2017-03-16 DIAGNOSIS — G4719 Other hypersomnia: Secondary | ICD-10-CM | POA: Diagnosis not present

## 2017-03-16 DIAGNOSIS — G4733 Obstructive sleep apnea (adult) (pediatric): Secondary | ICD-10-CM | POA: Diagnosis not present

## 2017-03-16 DIAGNOSIS — G4736 Sleep related hypoventilation in conditions classified elsewhere: Secondary | ICD-10-CM | POA: Diagnosis not present

## 2017-03-19 ENCOUNTER — Ambulatory Visit (HOSPITAL_COMMUNITY)
Admission: RE | Admit: 2017-03-19 | Discharge: 2017-03-19 | Disposition: A | Discharge status: Home / Self Care | Payer: Medicare Other | Source: Ambulatory Visit | Attending: Internal Medicine | Admitting: Internal Medicine

## 2017-03-19 ENCOUNTER — Other Ambulatory Visit (HOSPITAL_COMMUNITY): Payer: Medicare Other

## 2017-03-19 DIAGNOSIS — J984 Other disorders of lung: Secondary | ICD-10-CM | POA: Diagnosis not present

## 2017-03-19 DIAGNOSIS — I10 Essential (primary) hypertension: Secondary | ICD-10-CM | POA: Diagnosis not present

## 2017-03-19 DIAGNOSIS — I051 Rheumatic mitral insufficiency: Secondary | ICD-10-CM | POA: Diagnosis not present

## 2017-03-19 LAB — PULMONARY FUNCTION TEST
DL/VA % pred: 81 %
DL/VA: 3.58 ml/min/mmHg/L
DLCO COR % PRED: 62 %
DLCO COR: 17.78 ml/min/mmHg
DLCO UNC: 16.07 ml/min/mmHg
DLCO unc % pred: 56 %
FEF 25-75 POST: 1.74 L/s
FEF 25-75 Pre: 1.94 L/sec
FEF2575-%Change-Post: -10 %
FEF2575-%PRED-PRE: 113 %
FEF2575-%Pred-Post: 101 %
FEV1-%Change-Post: -1 %
FEV1-%PRED-POST: 98 %
FEV1-%Pred-Pre: 100 %
FEV1-POST: 2.46 L
FEV1-Pre: 2.5 L
FEV1FVC-%Change-Post: -2 %
FEV1FVC-%PRED-PRE: 104 %
FEV6-%Change-Post: 0 %
FEV6-%Pred-Post: 100 %
FEV6-%Pred-Pre: 101 %
FEV6-POST: 3.29 L
FEV6-Pre: 3.31 L
FEV6FVC-%Change-Post: -1 %
FEV6FVC-%PRED-POST: 105 %
FEV6FVC-%Pred-Pre: 107 %
FVC-%Change-Post: 0 %
FVC-%PRED-POST: 94 %
FVC-%PRED-PRE: 93 %
FVC-POST: 3.35 L
FVC-PRE: 3.32 L
PRE FEV1/FVC RATIO: 75 %
Post FEV1/FVC ratio: 73 %
Post FEV6/FVC ratio: 98 %
Pre FEV6/FVC Ratio: 100 %
RV % pred: 90 %
RV: 2.24 L
TLC % PRED: 90 %
TLC: 5.82 L

## 2017-03-19 MED ORDER — ALBUTEROL SULFATE (2.5 MG/3ML) 0.083% IN NEBU
2.5000 mg | INHALATION_SOLUTION | Freq: Once | RESPIRATORY_TRACT | Status: AC
  Administered 2017-03-19: 2.5 mg via RESPIRATORY_TRACT

## 2017-03-19 NOTE — Progress Notes (Signed)
  Echocardiogram 2D Echocardiogram has been performed.  Donata Clay 03/19/2017, 2:41 PM

## 2017-03-23 ENCOUNTER — Telehealth: Payer: Self-pay | Admitting: *Deleted

## 2017-03-23 DIAGNOSIS — G4733 Obstructive sleep apnea (adult) (pediatric): Secondary | ICD-10-CM

## 2017-03-23 NOTE — Telephone Encounter (Signed)
-----   Message from Sueanne Margarita, MD sent at 03/23/2017  4:56 PM EDT ----- Please let patient know that they have sleep apnea and recommend CPAP titration. Please set up titration in the sleep lab.

## 2017-03-23 NOTE — Telephone Encounter (Addendum)
Informed patient of sleep study results and patient understanding was verbalized. Patient understands his titration study is scheduled for May 21 2017. Patient understands his sleep study will be done at [facility] sleep lab. Patient understands he will receive a sleep packet in a week or so. Patient understands to call if he does not receive the sleep packet in a timely manner. Patient agrees with treatment and thanked me for call

## 2017-03-23 NOTE — Procedures (Signed)
   Patient Name: [name], [name] Date: 03/16/2017 Gender: Male D.O.B: [birth year] Age (years): 81 Referring Provider: Shaune Pascal Bensimhon Height (inches): 3 Interpreting Physician: Fransico Him MD, ABSM Weight (lbs): 141 RPSGT: Madelon Lips BMI: 22 MRN: [id number] Neck Size: 14.00  CLINICAL INFORMATION Sleep Study Type: NPSG  Indication for sleep study: Hypertension  Epworth Sleepiness Score: 7  SLEEP STUDY TECHNIQUE As per the AASM Manual for the Scoring of Sleep and Associated Events v2.3 (April 2016) with a hypopnea requiring 4% desaturations.  The channels recorded and monitored were frontal, central and occipital EEG, electrooculogram (EOG), submentalis EMG (chin), nasal and oral airflow, thoracic and abdominal wall motion, anterior tibialis EMG, snore microphone, electrocardiogram, and pulse oximetry.  MEDICATIONS Medications self-administered by patient taken the night of the study : N/A  SLEEP ARCHITECTURE The study was initiated at 10:23:26 PM and ended at 4:43:55 AM.  Sleep onset time was 5.7 minutes and the sleep efficiency was 40.6%. The total sleep time was 154.3 minutes.  Stage REM latency was 100.0 minutes.  The patient spent 21.27% of the night in stage N1 sleep, 71.28% in stage N2 sleep, 0.00% in stage N3 and 7.45% in REM.  Alpha intrusion was absent.  Supine sleep was 79.05%.  RESPIRATORY PARAMETERS The overall apnea/hypopnea index (AHI) was 12.1 per hour. There were 11 total apneas, including 3 obstructive, 8 central and 0 mixed apneas. There were 20 hypopneas and 19 RERAs.  The AHI during Stage REM sleep was 15.7 per hour.  AHI while supine was 10.3 per hour.  The mean oxygen saturation was 90.00%. The minimum SpO2 during sleep was 79.00%.  Moderate snoring was noted during this study.  CARDIAC DATA The 2 lead EKG demonstrated sinus rhythm. The mean heart rate was 50.78 beats per minute. Other EKG findings include: PACs.  LEG MOVEMENT  DATA The total PLMS were 169 with a resulting PLMS index of 65.71. Associated arousal with leg movement index was 1.2 .  IMPRESSIONS - Mild obstructive sleep apnea occurred during this study (AHI = 12.1/h). - No significant central sleep apnea occurred during this study (CAI = 3.1/h). - Moderate oxygen desaturation was noted during this study (Min O2 = 79.00%). - The patient snored with Moderate snoring volume. - PACs were noted during this study. - Severe periodic limb movements of sleep occurred during the study. No significant associated arousals.  DIAGNOSIS - Obstructive Sleep Apnea (327.23 [G47.33 ICD-10]) - Nocturnal Hypoxemia (327.26 [G47.36 ICD-10])  RECOMMENDATIONS - Therapeutic CPAP titration to determine optimal pressure required to alleviate sleep disordered breathing. - Avoid alcohol, sedatives and other CNS depressants that may worsen sleep apnea and disrupt normal sleep architecture. - Sleep hygiene should be reviewed to assess factors that may improve sleep quality. - Weight management and regular exercise should be initiated or continued if appropriate.  [place], American Board of Sleep Medicine  ELECTRONICALLY SIGNED ON:  03/23/2017, 4:54 PM [place] PH: [phone]   FX: [phone] [place]

## 2017-03-24 NOTE — Telephone Encounter (Signed)
-----   Message from Sueanne Margarita, MD sent at 03/23/2017  4:56 PM EDT ----- Please let patient know that they have sleep apnea and recommend CPAP titration. Please set up titration in the sleep lab.

## 2017-03-24 NOTE — Telephone Encounter (Signed)
LMTCB

## 2017-03-24 NOTE — Telephone Encounter (Deleted)
-----   Message from Sueanne Margarita, MD sent at 03/23/2017  4:56 PM EDT ----- Please let patient know that they have sleep apnea and recommend CPAP titration. Please set up titration in the sleep lab.

## 2017-03-27 NOTE — Telephone Encounter (Signed)
Patient called today to ask if his TITRATION could be moved up from 05/21/2017 to a sooner appointment Per his doctor stating he has swelling in his feet and legs and wants to avoid the patient having possible cardiac arrest. Per the patient his doctor wants his study done before his next appointment with him. I have sent a staff message to the pre-cert department asking them if they could pre-cert the patient's insurance asap so the sleep lab can possibly move the patient's appointment up or place him on the cancellation list to get him in before his next doctor's appointment. Awaiting to hear from pre-cert.

## 2017-03-27 NOTE — Telephone Encounter (Signed)
Pre-cert department Caryl Pina) says ok to move sleep study up if there is availabilty, no pre-cert required

## 2017-03-27 NOTE — Telephone Encounter (Deleted)
-----   Message from Sueanne Margarita, MD sent at 03/23/2017  4:56 PM EDT ----- Please let patient know that they have sleep apnea and recommend CPAP titration. Please set up titration in the sleep lab.

## 2017-03-30 ENCOUNTER — Encounter: Payer: Self-pay | Admitting: *Deleted

## 2017-04-01 NOTE — Telephone Encounter (Signed)
Per the pre-cert department (Charmaine) ok to move patient's appointment up. Per the sleep Lab Cloyd Stagers) only availability is the cancellation list and patient would have to come at a moments notice. Patient notified and agrees to treatment plan.

## 2017-05-08 ENCOUNTER — Encounter (HOSPITAL_BASED_OUTPATIENT_CLINIC_OR_DEPARTMENT_OTHER): Payer: Medicare Other

## 2017-05-11 ENCOUNTER — Ambulatory Visit (HOSPITAL_COMMUNITY)
Admission: RE | Admit: 2017-05-11 | Discharge: 2017-05-11 | Disposition: A | Discharge status: Home / Self Care | Payer: Medicare Other | Source: Ambulatory Visit | Attending: Internal Medicine | Admitting: Internal Medicine

## 2017-05-11 ENCOUNTER — Ambulatory Visit (HOSPITAL_BASED_OUTPATIENT_CLINIC_OR_DEPARTMENT_OTHER): Payer: Medicare Other | Attending: Cardiology | Admitting: Cardiology

## 2017-05-11 ENCOUNTER — Encounter (HOSPITAL_COMMUNITY): Payer: Self-pay | Admitting: Internal Medicine

## 2017-05-11 VITALS — Ht 67.0 in | Wt 140.0 lb

## 2017-05-11 VITALS — BP 152/72 | HR 76 | Wt 139.5 lb

## 2017-05-11 DIAGNOSIS — N189 Chronic kidney disease, unspecified: Secondary | ICD-10-CM | POA: Diagnosis not present

## 2017-05-11 DIAGNOSIS — R600 Localized edema: Secondary | ICD-10-CM | POA: Diagnosis not present

## 2017-05-11 DIAGNOSIS — R079 Chest pain, unspecified: Secondary | ICD-10-CM

## 2017-05-11 DIAGNOSIS — M1990 Unspecified osteoarthritis, unspecified site: Secondary | ICD-10-CM | POA: Diagnosis not present

## 2017-05-11 DIAGNOSIS — G4730 Sleep apnea, unspecified: Secondary | ICD-10-CM | POA: Diagnosis not present

## 2017-05-11 DIAGNOSIS — K449 Diaphragmatic hernia without obstruction or gangrene: Secondary | ICD-10-CM | POA: Diagnosis not present

## 2017-05-11 DIAGNOSIS — K589 Irritable bowel syndrome without diarrhea: Secondary | ICD-10-CM | POA: Insufficient documentation

## 2017-05-11 DIAGNOSIS — I10 Essential (primary) hypertension: Secondary | ICD-10-CM | POA: Diagnosis not present

## 2017-05-11 DIAGNOSIS — Z803 Family history of malignant neoplasm of breast: Secondary | ICD-10-CM | POA: Insufficient documentation

## 2017-05-11 DIAGNOSIS — I491 Atrial premature depolarization: Secondary | ICD-10-CM | POA: Diagnosis not present

## 2017-05-11 DIAGNOSIS — K900 Celiac disease: Secondary | ICD-10-CM | POA: Insufficient documentation

## 2017-05-11 DIAGNOSIS — Z87891 Personal history of nicotine dependence: Secondary | ICD-10-CM | POA: Diagnosis not present

## 2017-05-11 DIAGNOSIS — R0683 Snoring: Secondary | ICD-10-CM | POA: Diagnosis not present

## 2017-05-11 DIAGNOSIS — Z808 Family history of malignant neoplasm of other organs or systems: Secondary | ICD-10-CM | POA: Diagnosis not present

## 2017-05-11 DIAGNOSIS — Z800 Family history of malignant neoplasm of digestive organs: Secondary | ICD-10-CM | POA: Diagnosis not present

## 2017-05-11 DIAGNOSIS — K219 Gastro-esophageal reflux disease without esophagitis: Secondary | ICD-10-CM | POA: Insufficient documentation

## 2017-05-11 DIAGNOSIS — Z79899 Other long term (current) drug therapy: Secondary | ICD-10-CM | POA: Diagnosis not present

## 2017-05-11 DIAGNOSIS — I129 Hypertensive chronic kidney disease with stage 1 through stage 4 chronic kidney disease, or unspecified chronic kidney disease: Secondary | ICD-10-CM | POA: Diagnosis not present

## 2017-05-11 DIAGNOSIS — R0609 Other forms of dyspnea: Secondary | ICD-10-CM | POA: Diagnosis not present

## 2017-05-11 DIAGNOSIS — R0789 Other chest pain: Secondary | ICD-10-CM | POA: Diagnosis not present

## 2017-05-11 DIAGNOSIS — G4733 Obstructive sleep apnea (adult) (pediatric): Secondary | ICD-10-CM | POA: Insufficient documentation

## 2017-05-11 DIAGNOSIS — N400 Enlarged prostate without lower urinary tract symptoms: Secondary | ICD-10-CM | POA: Diagnosis not present

## 2017-05-11 LAB — BASIC METABOLIC PANEL
Anion gap: 5 (ref 5–15)
BUN: 15 mg/dL (ref 6–20)
CHLORIDE: 106 mmol/L (ref 101–111)
CO2: 29 mmol/L (ref 22–32)
CREATININE: 1.36 mg/dL — AB (ref 0.61–1.24)
Calcium: 9 mg/dL (ref 8.9–10.3)
GFR, EST AFRICAN AMERICAN: 55 mL/min — AB (ref >60)
GFR, EST NON AFRICAN AMERICAN: 48 mL/min — AB (ref >60)
Glucose, Bld: 80 mg/dL (ref 65–99)
Potassium: 4.4 mmol/L (ref 3.5–5.1)
SODIUM: 140 mmol/L (ref 135–145)

## 2017-05-11 LAB — CBC
HCT: 34.7 % — ABNORMAL LOW (ref 39.0–52.0)
Hemoglobin: 11.4 g/dL — ABNORMAL LOW (ref 13.0–17.0)
MCH: 28.6 pg (ref 26.0–34.0)
MCHC: 32.9 g/dL (ref 30.0–36.0)
MCV: 87.2 fL (ref 78.0–100.0)
PLATELETS: 206 10*3/uL (ref 150–400)
RBC: 3.98 MIL/uL — AB (ref 4.22–5.81)
RDW: 14.3 % (ref 11.5–15.5)
WBC: 5.4 10*3/uL (ref 4.0–10.5)

## 2017-05-11 NOTE — Progress Notes (Signed)
ADVANCED HF CLINIC CONSULT NOTE  Referring Physician: Bartle Primary Care: [place]  Primary Cardiologist: None  HPI:  [name] is a 82 y/o male with h/o HTN, CKD, celiac disease referred by Dr. Cyndia Bent for further evaluation of LE edema and DOE.   In 2010 broke his back after falling out of a hammock. Pre-op studies as below:  2010 Myoview EF 64% ? Inferior ischemia. Followed by cath with reportedly normal coronaries 2010 Echo LV and RV normal  He presented in May 2018 with dyspnea, chest pressure and LE edema. Underwent RHC as below.   He returns today for post-cath follow up. He had a sleep study in June 2018, needs CPAP. AHI looks to be 31 with combination of central and obstructive apnea. Going for fitting of CPAP mask and titration today.  Feeling better. More active. Still with some swelling in his legs but much better with stockings. No orthopnea or PND> no CP.   May 2018 R/L heart cath  Findings:  RA = 2 RV = 24/1 PA = 22/3 (13) PCW = 3 Fick cardiac output/index = 5.9/3.4 PVR = 1.7 WU Ao sat = 97% PA sat = 70%, 74%  Assessment: 1. Normal hemodynamics 2. EF 60% 3. Normal coronary arteries   Past Medical History:  Diagnosis Date  . Arthritis    osteoarthritis  back  . CD (celiac disease)    followed by dr Earlean Shawl  . Chronic kidney disease   . Enlarged prostate    followed by dr  Dorina Hoyer  . Essential hypertension, benign 03/29/2014  . Full dentures   . GERD (gastroesophageal reflux disease)   . Hemorrhoids   . Hiatal hernia   . IBS (irritable bowel syndrome)   . Osteoporosis   . Postoperative anemia due to acute blood loss 03/29/2014  . Ulcer     Current Outpatient Prescriptions  Medication Sig Dispense Refill  . albuterol (PROVENTIL HFA;VENTOLIN HFA) 108 (90 Base) MCG/ACT inhaler Inhale 2 puffs into the lungs every 6 (six) hours as needed for wheezing or shortness of breath.    Marland Kitchen alendronate (FOSAMAX) 70 MG tablet Take 70 mg by mouth once a week.  Take with a full glass of water on an empty stomach.    Marland Kitchen amLODipine (NORVASC) 10 MG tablet Take 10 mg by mouth daily.    Marland Kitchen azelastine (ASTELIN) 0.1 % nasal spray Place 2 sprays into both nostrils daily. Use in each nostril as directed     . cholecalciferol (VITAMIN D) 1000 UNITS tablet Take 1,000 Units by mouth daily.      Marland Kitchen docusate sodium 100 MG CAPS Take 100 mg by mouth 2 (two) times daily. (Patient taking differently: Take 100 mg by mouth daily as needed (constipation). ) 10 capsule 0  . doxazosin (CARDURA) 2 MG tablet Take 2 mg by mouth daily.    . ferrous sulfate 325 (65 FE) MG tablet Take 1 tablet (325 mg total) by mouth 3 (three) times daily after meals. (Patient taking differently: Take 325 mg by mouth daily. ) 90 tablet 1  . fluticasone (FLONASE) 50 MCG/ACT nasal spray Place 1 spray into both nostrils at bedtime.    . furosemide (LASIX) 40 MG tablet Take 20-40 mg by mouth 2 (two) times daily. Takes 1 tablet in the morning and 1/2 tab in the afternoon    . HYDROcodone-acetaminophen (NORCO) 5-325 MG per tablet Take 1-2 tablets by mouth every 6 (six) hours as needed. (Patient taking differently: Take 1 tablet by  mouth 3 (three) times daily as needed for moderate pain. ) 90 tablet 0  . loratadine-pseudoephedrine (CLARITIN-D 24-HOUR) 10-240 MG 24 hr tablet Take 1 tablet by mouth at bedtime.    . meloxicam (MOBIC) 15 MG tablet Take 15 mg by mouth 2 (two) times daily.     . montelukast (SINGULAIR) 10 MG tablet Take 10 mg by mouth daily.     . Multiple Vitamin (MULTIVITAMIN) tablet Take 1 tablet by mouth daily.     Marland Kitchen omeprazole (PRILOSEC) 40 MG capsule Take 40 mg by mouth 2 (two) times daily.     . polyethylene glycol (MIRALAX / GLYCOLAX) packet Take 17 g by mouth daily as needed for mild constipation. 14 each 0  . rOPINIRole (REQUIP) 1 MG tablet Take 1 mg by mouth at bedtime.     Marland Kitchen tiZANidine (ZANAFLEX) 4 MG tablet Take 4 mg by mouth at bedtime as needed for muscle spasms. For pain; 1 -2 tablets  depending on pain level    . zolpidem (AMBIEN) 10 MG tablet Take 10 mg by mouth at bedtime as needed for sleep.     No current facility-administered medications for this encounter.     No Known Allergies    Social History   Social History  . Marital status: Married    Spouse name: N/A  . Number of children: N/A  . Years of education: N/A   Occupational History  . Not on file.   Social History Main Topics  . Smoking status: Former Smoker    Quit date: 08/11/1981  . Smokeless tobacco: Never Used  . Alcohol use Yes     Comment: beer- socially  . Drug use: No  . Sexual activity: Not on file   Other Topics Concern  . Not on file   Social History Narrative  . No narrative on file      Family History  Problem Relation Age of Onset  . Cancer Mother        cervical  . Stroke Father   . Cancer Sister        breast  . Cancer Brother        throat  . Cancer Brother        liver  . Cancer Brother        brain    Vitals:   05/11/17 1128  BP: (!) 152/72  Pulse: 76  SpO2: 100%  Weight: 139 lb 8 oz (63.3 kg)    PHYSICAL EXAM: General:  Well appearing. No resp difficulty HEENT: normal Neck: supple. no JVD. Carotids 2+ bilat; no bruits. No lymphadenopathy or thryomegaly appreciated. Cor: PMI nondisplaced. Regular rate & rhythm. No rubs, gallops or murmurs. Lungs: clear Abdomen: soft, nontender, nondistended. No hepatosplenomegaly. No bruits or masses. Good bowel sounds. Extremities: no cyanosis, clubbing, rash, edema Neuro: alert & orientedx3, cranial nerves grossly intact. moves all 4 extremities w/o difficulty. Affect pleasant   ASSESSMENT & PLAN: 1. Exertional dyspnea and LE edema - Reviewed results of cath with him and his wife. - Normal coronaries. Filling pressures look good.  - Encouraged him not to overdue his diuretic. I worry that he overestimates the swelling in his lower extremitites and may push the diuretic too hard. Will follow BMETs to avoid  overdiuresis.  - Suspect he may have a component of diastolic HF versus elevated R-sided pressures in setting of undiagnosed OSA.   2. Sleep apnea. - I think this is his main issue - Sleep study reviewed with him  personally. AHI ~30 with both central and obstructive components - has titration study tonight  Maryna Yeagle, Quillian Quince, MD  8:04 PM

## 2017-05-11 NOTE — Patient Instructions (Signed)
Labs today (will call for abnormal results, otherwise no news is good news)  Follow up in 1 year.  Call us at [phone] and press Option 3 in June/July of next year to schedule your appointment.

## 2017-05-12 NOTE — Procedures (Addendum)
   Patient Name: [name], [name] Date: 05/11/2017 Gender: Male D.O.B: 1935-02-10 Age (years): 58 Referring Provider: Fransico Him MD, ABSM Height (inches): 67 Interpreting Physician: Fransico Him MD, ABSM Weight (lbs): 141 RPSGT: Madelon Lips BMI: 22 MRN: [id number] Neck Size: 14.00  CLINICAL INFORMATION The patient is referred for a CPAP titration to treat sleep apnea. Date of NPSG, Split Night or HST:  SLEEP STUDY TECHNIQUE As per the AASM Manual for the Scoring of Sleep and Associated Events v2.3 (April 2016) with a hypopnea requiring 4% desaturations.  The channels recorded and monitored were frontal, central and occipital EEG, electrooculogram (EOG), submentalis EMG (chin), nasal and oral airflow, thoracic and abdominal wall motion, anterior tibialis EMG, snore microphone, electrocardiogram, and pulse oximetry. Continuous positive airway pressure (CPAP) was initiated at the beginning of the study and titrated to treat sleep-disordered breathing.  MEDICATIONS Medications self-administered by patient taken the night of the study : N/A  TECHNICIAN COMMENTS Comments added by technician: PATIENT REMOVED DENTAL PARTIALS PRIOR TO START OF STUDY. BATHROOM BREAKS 1X. PATIENT REPORTED TAKING NIGHT MEDICATIONS AT 2115  Comments added by scorer: N/A  RESPIRATORY PARAMETERS Optimal PAP Pressure (cm): 9  AHI at Optimal Pressure (/hr):0.9 Overall Minimal O2 (%):88.00  Supine % at Optimal Pressure (%): 46 Minimal O2 at Optimal Pressure (%): 91.0    SLEEP ARCHITECTURE The study was initiated at 10:08:01 PM and ended at 4:28:05 AM.  Sleep onset time was 8.9 minutes and the sleep efficiency was 75.4%. The total sleep time was 286.5 minutes.  The patient spent 11.17% of the night in stage N1 sleep, 74.52% in stage N2 sleep, 0.52% in stage N3 and 13.79% in REM.Stage REM latency was 61.5 minutes  Wake after sleep onset was 84.6. Alpha intrusion was absent. Supine sleep was  61.07%.  CARDIAC DATA The 2 lead EKG demonstrated sinus rhythm. The mean heart rate was 49.63 beats per minute. Other EKG findings include: PACs.  LEG MOVEMENT DATA The total Periodic Limb Movements of Sleep (PLMS) were 0. The PLMS index was 0.00. A PLMS index of <15 is considered normal in adults.  IMPRESSIONS The optimal PAP pressure was 9 cm of water. Central sleep apnea was not noted during this titration (CAI = 0.8/h). Oxygen desaturations were observed during this titration (min O2 = 88.00%). The patient snored with Moderate snoring volume during this titration study. 2-lead EKG demonstrated: PACs Clinically significant periodic limb movements were not noted during this study. Arousals associated with PLMs were rare.  DIAGNOSIS Obstructive Sleep Apnea (327.23 [G47.33 ICD-10])  RECOMMENDATIONS Trial of CPAP therapy on 9 cm H2O with a Small size Fisher&Paykel Full Face Mask Simplus mask and heated humidification. Avoid alcohol, sedatives and other CNS depressants that may worsen sleep apnea and disrupt normal sleep architecture. Sleep hygiene should be reviewed to assess factors that may improve sleep quality. Weight management and regular exercise should be initiated or continued. Return to Sleep Center for re-evaluation after 10 weeks of therapy  Signed: [place], American Board of Sleep Medicine  ELECTRONICALLY SIGNED ON:  05/12/2017, 10:59 AM [place] PH: [phone]   FX: [phone] [place]

## 2017-05-13 ENCOUNTER — Telehealth (HOSPITAL_COMMUNITY): Payer: Self-pay

## 2017-05-13 NOTE — Telephone Encounter (Signed)
Basic Metabolic Panel (BMET)  Order: [id number]  Status:  Final result Visible to patient:  No (Not Released) Dx:  Essential hypertension, benign  Notes recorded by Effie Berkshire, RN on 05/13/2017 at 12:00 PM EDT Patient prefers to have lab rechecked closer to home in [place]. Will return call to clinic to give [place] name of office to fax order for bmet to. ------  Notes recorded by Bensimhon, Shaune Pascal, MD on 05/11/2017 at 7:32 PM EDT Creatinine stable from previous. Please recheck BMET in 3-4 weeks

## 2017-05-14 ENCOUNTER — Telehealth: Payer: Self-pay | Admitting: *Deleted

## 2017-05-14 NOTE — Telephone Encounter (Signed)
-----   Message from Sueanne Margarita, MD sent at 05/12/2017 11:04 AM EDT ----- Please let patient know that they had a successful PAP titration and let DME know that orders are in EPIC.  Please set up 10 week OV with me.

## 2017-05-14 NOTE — Telephone Encounter (Signed)
Informed patient of sleep study results and patient understanding was verbalized. Patient understands Dr Radford Pax has ordered him a CPAP in epic. Patient understands he will be contacted by [facility] to set up his cpap. He understands to call if [facility] does not contact him with new setup in a timely manner. He understands he will be called once confirmation has been received from [facility] that he has received his new machine to schedule 10 week follow up appointment.  [place] notified of new cpap order in epic Please add to Maryfrances Bunnell He was grateful for the call and thanked me

## 2017-05-18 ENCOUNTER — Telehealth (HOSPITAL_COMMUNITY): Payer: Self-pay

## 2017-05-18 NOTE — Telephone Encounter (Signed)
Basic Metabolic Panel (BMET)  Order: [id number]  Status:  Final result Visible to patient:  No (Not Released) Dx:  Essential hypertension, benign  Notes recorded by Effie Berkshire, RN on 05/18/2017 at 12:54 PM EDT Patient to have bmet drawn on 06/08/2017 at [facility] in [place]. Order faxed. ------  Notes recorded by Effie Berkshire, RN on 05/13/2017 at 12:00 PM EDT Patient prefers to have lab rechecked closer to home in [place]. Will return call to clinic to give [place] name of office to fax order for bmet to. ------  Notes recorded by Bensimhon, Shaune Pascal, MD on 05/11/2017 at 7:32 PM EDT Creatinine stable from previous. Please recheck BMET in 3-4 weeks

## 2017-05-21 ENCOUNTER — Encounter (HOSPITAL_BASED_OUTPATIENT_CLINIC_OR_DEPARTMENT_OTHER): Payer: Medicare Other

## 2017-05-27 DIAGNOSIS — M1990 Unspecified osteoarthritis, unspecified site: Secondary | ICD-10-CM | POA: Diagnosis not present

## 2017-05-27 DIAGNOSIS — Z6822 Body mass index (BMI) 22.0-22.9, adult: Secondary | ICD-10-CM | POA: Diagnosis not present

## 2017-05-27 DIAGNOSIS — G8929 Other chronic pain: Secondary | ICD-10-CM | POA: Diagnosis not present

## 2017-06-08 DIAGNOSIS — I5022 Chronic systolic (congestive) heart failure: Secondary | ICD-10-CM | POA: Diagnosis not present

## 2017-06-19 ENCOUNTER — Other Ambulatory Visit (HOSPITAL_COMMUNITY): Payer: Self-pay | Admitting: Internal Medicine

## 2017-07-03 ENCOUNTER — Telehealth: Payer: Self-pay | Admitting: *Deleted

## 2017-07-03 NOTE — Telephone Encounter (Signed)
Informed patient of compliance results and verbalized understanding was indicated. Patient understands his AHI was in normal range. Patient understands his compliance is good. Patient understands his settings will not change. Patient was grateful and thanked me for the call.

## 2017-07-03 NOTE — Telephone Encounter (Signed)
-----   Message from Sueanne Margarita, MD sent at 06/29/2017  3:49 PM EDT ----- Good AHI and compliance.  Continue current CPAP settings.

## 2017-07-10 NOTE — Telephone Encounter (Signed)
Patient has a 10 week sleep appointment August 24 2017. Patient understands he must keep this appointment for his insurance compliance.

## 2017-07-24 ENCOUNTER — Telehealth: Payer: Self-pay | Admitting: Cardiology

## 2017-07-24 NOTE — Telephone Encounter (Signed)
November appointment is for sleep follow up.  I spoke with Gae Bon and no lab work or other orders needed prior to this appointment.  I placed call to pt and left message to call back

## 2017-07-24 NOTE — Telephone Encounter (Signed)
New message    Patient wife calling to request lab order be sent to patient to get labs prior to November appt. Please call

## 2017-07-27 NOTE — Telephone Encounter (Signed)
Pt's wife, [name], (with pt's verbal permission) advised pt does not need lab prior to November appointment for sleep follow-up.

## 2017-08-19 ENCOUNTER — Encounter: Payer: Self-pay | Admitting: Cardiology

## 2017-08-24 ENCOUNTER — Ambulatory Visit (INDEPENDENT_AMBULATORY_CARE_PROVIDER_SITE_OTHER): Payer: Medicare Other | Admitting: Cardiology

## 2017-08-24 ENCOUNTER — Encounter (INDEPENDENT_AMBULATORY_CARE_PROVIDER_SITE_OTHER): Payer: Self-pay

## 2017-08-24 ENCOUNTER — Encounter: Payer: Self-pay | Admitting: Cardiology

## 2017-08-24 VITALS — BP 162/88 | HR 68 | Ht 67.0 in | Wt 145.4 lb

## 2017-08-24 DIAGNOSIS — I10 Essential (primary) hypertension: Secondary | ICD-10-CM | POA: Diagnosis not present

## 2017-08-24 DIAGNOSIS — G4733 Obstructive sleep apnea (adult) (pediatric): Secondary | ICD-10-CM

## 2017-08-24 NOTE — Patient Instructions (Signed)

## 2017-08-24 NOTE — Progress Notes (Signed)
Cardiology Office Note:    Date:  08/24/2017   ID:  Bernarda Caffey, DOB 12-01-1934, MRN [id number]  PCP:  Rigoberto Noel, MD  Cardiologist:  Fransico Him, MD   Referring MD: Rigoberto Noel, MD   Chief Complaint  Patient presents with  . Sleep Apnea    History of Present Illness:    [name] is a 82 y.o. male with a hx of CKD, HTN and CHF who was referred by Dr. Haroldine Laws due to LE edema and witnessed apnea by his wife and was sent for evaluation of OSA.  He underwent PSG showing mild OSA with an AHI of 12/hr and oxygen desaturations as low as 79%.  He underwent CPAP titration to 9cm H2O. He is doing well with his CPAP device and thinks that he has gotten used to it.  He tolerates the full face mask and feels the pressure is adequate.  He is sleeping much better and is going into deep sleep.  Since going on CPAP he feels rested in the am and has no significant daytime sleepiness.  He denies any significant  nasal dryness or nasal congestion. But does have problems with mouth dryness.  He does not think that he snores.     Past Medical History:  Diagnosis Date  . Arthritis    osteoarthritis  back  . CD (celiac disease)    followed by dr Earlean Shawl  . Chronic kidney disease   . Enlarged prostate    followed by dr  Dorina Hoyer  . Essential hypertension, benign 03/29/2014  . Full dentures   . GERD (gastroesophageal reflux disease)   . Hemorrhoids   . Hiatal hernia   . IBS (irritable bowel syndrome)   . Osteoporosis   . Postoperative anemia due to acute blood loss 03/29/2014  . Ulcer     Past Surgical History:  Procedure Laterality Date  . APPENDECTOMY  1963  . EYE SURGERY Bilateral    cataract removal - implants  . HERNIA REPAIR  2002   right inguinal   . INGUINAL HERNIA REPAIR  08/19/2011   Procedure: HERNIA REPAIR  left INGUINAL ADULT;  Surgeon: Odis Hollingshead, MD;  Location: [facility];  Service: General;  Laterality: Left;    Current Medications: Current Meds    Medication Sig  . albuterol (PROVENTIL HFA;VENTOLIN HFA) 108 (90 Base) MCG/ACT inhaler Inhale 2 puffs into the lungs every 6 (six) hours as needed for wheezing or shortness of breath.  Marland Kitchen alendronate (FOSAMAX) 70 MG tablet Take 70 mg by mouth once a week. Take with a full glass of water on an empty stomach.  Marland Kitchen amLODipine (NORVASC) 10 MG tablet Take 10 mg by mouth daily.  Marland Kitchen azelastine (ASTELIN) 0.1 % nasal spray Place 2 sprays into both nostrils daily. Use in each nostril as directed   . cholecalciferol (VITAMIN D) 1000 UNITS tablet Take 1,000 Units by mouth daily.    Marland Kitchen docusate sodium 100 MG CAPS Take 100 mg by mouth 2 (two) times daily. (Patient taking differently: Take 100 mg by mouth daily as needed (constipation). )  . doxazosin (CARDURA) 2 MG tablet Take 2 mg by mouth daily.  . ferrous sulfate 325 (65 FE) MG tablet Take 1 tablet (325 mg total) by mouth 3 (three) times daily after meals. (Patient taking differently: Take 325 mg by mouth daily. )  . fluticasone (FLONASE) 50 MCG/ACT nasal spray Place 1 spray into both nostrils at bedtime.  . furosemide (LASIX) 40 MG  tablet Take 40 mg by mouth daily.  Marland Kitchen HYDROcodone-acetaminophen (NORCO) 5-325 MG per tablet Take 1-2 tablets by mouth every 6 (six) hours as needed. (Patient taking differently: Take 1 tablet by mouth 3 (three) times daily as needed for moderate pain. )  . loratadine-pseudoephedrine (CLARITIN-D 24-HOUR) 10-240 MG 24 hr tablet Take 1 tablet by mouth at bedtime.  . meloxicam (MOBIC) 15 MG tablet Take 15 mg by mouth 2 (two) times daily.   . montelukast (SINGULAIR) 10 MG tablet Take 10 mg by mouth daily.   . Multiple Vitamin (MULTIVITAMIN) tablet Take 1 tablet by mouth daily.   Marland Kitchen omeprazole (PRILOSEC) 40 MG capsule Take 40 mg by mouth 2 (two) times daily.   . polyethylene glycol (MIRALAX / GLYCOLAX) packet Take 17 g by mouth daily as needed for mild constipation.  Marland Kitchen rOPINIRole (REQUIP) 1 MG tablet Take 1 mg by mouth at bedtime.   Marland Kitchen  tiZANidine (ZANAFLEX) 4 MG tablet Take 4 mg by mouth at bedtime as needed for muscle spasms. For pain; 1 -2 tablets depending on pain level  . zolpidem (AMBIEN) 10 MG tablet Take 10 mg by mouth at bedtime as needed for sleep.     Allergies:   Patient has no known allergies.   Social History   Socioeconomic History  . Marital status: Married    Spouse name: None  . Number of children: None  . Years of education: None  . Highest education level: None  Social Needs  . Financial resource strain: None  . Food insecurity - worry: None  . Food insecurity - inability: None  . Transportation needs - medical: None  . Transportation needs - non-medical: None  Occupational History  . None  Tobacco Use  . Smoking status: Former Smoker    Last attempt to quit: 08/11/1981    Years since quitting: 36.0  . Smokeless tobacco: Never Used  Substance and Sexual Activity  . Alcohol use: Yes    Comment: beer- socially  . Drug use: No  . Sexual activity: None  Other Topics Concern  . None  Social History Narrative  . None     Family History: The patient's family history includes Cancer in his brother, brother, brother, mother, and sister; Stroke in his father.  ROS:   Please see the history of present illness.    ROS  All other systems reviewed and negative.   EKGs/Labs/Other Studies Reviewed:    The following studies were reviewed today: CPAP donwload  EKG:  EKG is not ordered today.    Recent Labs: 05/11/2017: BUN 15; Creatinine, Ser 1.36; Hemoglobin 11.4; Platelets 206; Potassium 4.4; Sodium 140   Recent Lipid Panel    Component Value Date/Time   CHOL  04/25/2009 0155    134        ATP III CLASSIFICATION:  <200     mg/dL   Desirable  200-239  mg/dL   Borderline High  >=240    mg/dL   High          TRIG 57 04/25/2009 0155   HDL 73 04/25/2009 0155   CHOLHDL 1.8 04/25/2009 0155   VLDL 11 04/25/2009 0155   LDLCALC  04/25/2009 0155    50        Total Cholesterol/HDL:CHD  Risk Coronary Heart Disease Risk Table                     Men   Women  1/2 Average Risk   3.4  3.3  Average Risk       5.0   4.4  2 X Average Risk   9.6   7.1  3 X Average Risk  23.4   11.0        Use the calculated Patient Ratio above and the CHD Risk Table to determine the patient's CHD Risk.        ATP III CLASSIFICATION (LDL):  <100     mg/dL   Optimal  100-129  mg/dL   Near or Above                    Optimal  130-159  mg/dL   Borderline  160-189  mg/dL   High  >190     mg/dL   Very High    Physical Exam:    VS:  BP (!) 162/88   Pulse 68   Ht 5' 7"  (1.702 m)   Wt 145 lb 6.4 oz (66 kg)   BMI 22.77 kg/m     Wt Readings from Last 3 Encounters:  08/24/17 145 lb 6.4 oz (66 kg)  05/11/17 139 lb 8 oz (63.3 kg)  05/11/17 140 lb (63.5 kg)     GEN:  Well nourished, well developed in no acute distress HEENT: Normal NECK: No JVD; No carotid bruits LYMPHATICS: No lymphadenopathy CARDIAC: RRR, no murmurs, rubs, gallops RESPIRATORY:  Clear to auscultation without rales, wheezing or rhonchi  ABDOMEN: Soft, non-tender, non-distended MUSCULOSKELETAL:  No edema; No deformity  SKIN: Warm and dry NEUROLOGIC:  Alert and oriented x 3 PSYCHIATRIC:  Normal affect   ASSESSMENT:    1. OSA (obstructive sleep apnea)   2. Essential hypertension, benign    PLAN:    In order of problems listed above:  1.  OSA - the patient is tolerating PAP therapy well without any problems. The PAP download was reviewed today and showed an AHI of 2.5/hr on 9 cm H2O with 87% compliance in using more than 4 hours nightly.  The patient has been using and benefiting from CPAP use and will continue to benefit from therapy. I have encouraged him to try adjusting the humidity and trying Biotene mouth wash as well as nasal saline spray.    2.  HTN - BP borderline controlled on exam today.  He will continue on Amlodipine 82m daily and doxazosin 260mdaily   Medication Adjustments/Labs and Tests  Ordered: Current medicines are reviewed at length with the patient today.  Concerns regarding medicines are outlined above.  No orders of the defined types were placed in this encounter.  No orders of the defined types were placed in this encounter.   Signed, TrFransico HimMD  08/24/2017 9:57 AM    [place]

## 2017-09-07 DIAGNOSIS — Z23 Encounter for immunization: Secondary | ICD-10-CM | POA: Diagnosis not present

## 2017-09-07 DIAGNOSIS — R5383 Other fatigue: Secondary | ICD-10-CM | POA: Diagnosis not present

## 2017-09-07 DIAGNOSIS — Z6823 Body mass index (BMI) 23.0-23.9, adult: Secondary | ICD-10-CM | POA: Diagnosis not present

## 2017-09-07 DIAGNOSIS — I10 Essential (primary) hypertension: Secondary | ICD-10-CM | POA: Diagnosis not present

## 2017-09-16 DIAGNOSIS — I10 Essential (primary) hypertension: Secondary | ICD-10-CM | POA: Diagnosis not present

## 2017-09-16 DIAGNOSIS — E7849 Other hyperlipidemia: Secondary | ICD-10-CM | POA: Diagnosis not present

## 2017-09-16 DIAGNOSIS — R82998 Other abnormal findings in urine: Secondary | ICD-10-CM | POA: Diagnosis not present

## 2017-09-16 DIAGNOSIS — M810 Age-related osteoporosis without current pathological fracture: Secondary | ICD-10-CM | POA: Diagnosis not present

## 2017-09-16 DIAGNOSIS — Z125 Encounter for screening for malignant neoplasm of prostate: Secondary | ICD-10-CM | POA: Diagnosis not present

## 2017-09-23 DIAGNOSIS — M810 Age-related osteoporosis without current pathological fracture: Secondary | ICD-10-CM | POA: Diagnosis not present

## 2017-09-23 DIAGNOSIS — I10 Essential (primary) hypertension: Secondary | ICD-10-CM | POA: Diagnosis not present

## 2017-09-23 DIAGNOSIS — Z1389 Encounter for screening for other disorder: Secondary | ICD-10-CM | POA: Diagnosis not present

## 2017-09-23 DIAGNOSIS — G2581 Restless legs syndrome: Secondary | ICD-10-CM | POA: Diagnosis not present

## 2017-09-23 DIAGNOSIS — G4733 Obstructive sleep apnea (adult) (pediatric): Secondary | ICD-10-CM | POA: Diagnosis not present

## 2017-09-23 DIAGNOSIS — Z6822 Body mass index (BMI) 22.0-22.9, adult: Secondary | ICD-10-CM | POA: Diagnosis not present

## 2017-09-23 DIAGNOSIS — R600 Localized edema: Secondary | ICD-10-CM | POA: Diagnosis not present

## 2017-09-23 DIAGNOSIS — Z1212 Encounter for screening for malignant neoplasm of rectum: Secondary | ICD-10-CM | POA: Diagnosis not present

## 2017-09-23 DIAGNOSIS — G8929 Other chronic pain: Secondary | ICD-10-CM | POA: Diagnosis not present

## 2017-09-23 DIAGNOSIS — Z0000 Encounter for general adult medical examination without abnormal findings: Secondary | ICD-10-CM | POA: Diagnosis not present

## 2017-09-23 DIAGNOSIS — I951 Orthostatic hypotension: Secondary | ICD-10-CM | POA: Diagnosis not present

## 2017-09-23 DIAGNOSIS — K900 Celiac disease: Secondary | ICD-10-CM | POA: Diagnosis not present

## 2017-09-23 DIAGNOSIS — E7849 Other hyperlipidemia: Secondary | ICD-10-CM | POA: Diagnosis not present

## 2017-09-29 ENCOUNTER — Ambulatory Visit (INDEPENDENT_AMBULATORY_CARE_PROVIDER_SITE_OTHER): Payer: Medicare Other | Admitting: Urology

## 2017-09-29 DIAGNOSIS — R351 Nocturia: Secondary | ICD-10-CM | POA: Diagnosis not present

## 2017-09-29 DIAGNOSIS — N401 Enlarged prostate with lower urinary tract symptoms: Secondary | ICD-10-CM

## 2017-10-08 DIAGNOSIS — M25552 Pain in left hip: Secondary | ICD-10-CM | POA: Diagnosis not present

## 2017-10-08 DIAGNOSIS — M7062 Trochanteric bursitis, left hip: Secondary | ICD-10-CM | POA: Diagnosis not present

## 2017-10-19 ENCOUNTER — Encounter: Payer: Self-pay | Admitting: Internal Medicine

## 2017-10-19 DIAGNOSIS — L821 Other seborrheic keratosis: Secondary | ICD-10-CM | POA: Diagnosis not present

## 2017-10-19 DIAGNOSIS — L820 Inflamed seborrheic keratosis: Secondary | ICD-10-CM | POA: Diagnosis not present

## 2017-10-19 DIAGNOSIS — L570 Actinic keratosis: Secondary | ICD-10-CM | POA: Diagnosis not present

## 2017-10-19 DIAGNOSIS — Z85828 Personal history of other malignant neoplasm of skin: Secondary | ICD-10-CM | POA: Diagnosis not present

## 2017-12-11 ENCOUNTER — Encounter: Payer: Self-pay | Admitting: *Deleted

## 2017-12-11 ENCOUNTER — Other Ambulatory Visit: Payer: Self-pay | Admitting: Internal Medicine

## 2017-12-22 ENCOUNTER — Encounter (INDEPENDENT_AMBULATORY_CARE_PROVIDER_SITE_OTHER): Payer: Self-pay

## 2017-12-22 ENCOUNTER — Other Ambulatory Visit (INDEPENDENT_AMBULATORY_CARE_PROVIDER_SITE_OTHER): Payer: Medicare Other

## 2017-12-22 ENCOUNTER — Ambulatory Visit (INDEPENDENT_AMBULATORY_CARE_PROVIDER_SITE_OTHER): Payer: Medicare Other | Admitting: Internal Medicine

## 2017-12-22 ENCOUNTER — Encounter: Payer: Self-pay | Admitting: Internal Medicine

## 2017-12-22 VITALS — BP 130/58 | HR 64 | Ht 67.0 in | Wt 140.0 lb

## 2017-12-22 DIAGNOSIS — R195 Other fecal abnormalities: Secondary | ICD-10-CM

## 2017-12-22 DIAGNOSIS — K900 Celiac disease: Secondary | ICD-10-CM

## 2017-12-22 DIAGNOSIS — K219 Gastro-esophageal reflux disease without esophagitis: Secondary | ICD-10-CM | POA: Diagnosis not present

## 2017-12-22 LAB — CBC WITH DIFFERENTIAL/PLATELET
BASOS ABS: 0.1 10*3/uL (ref 0.0–0.1)
BASOS PCT: 1.1 % (ref 0.0–3.0)
Eosinophils Absolute: 0.2 10*3/uL (ref 0.0–0.7)
Eosinophils Relative: 4.3 % (ref 0.0–5.0)
HCT: 32.6 % — ABNORMAL LOW (ref 39.0–52.0)
Hemoglobin: 11.4 g/dL — ABNORMAL LOW (ref 13.0–17.0)
LYMPHS PCT: 16.2 % (ref 12.0–46.0)
Lymphs Abs: 0.9 10*3/uL (ref 0.7–4.0)
MCHC: 34.9 g/dL (ref 30.0–36.0)
MCV: 88.2 fl (ref 78.0–100.0)
MONOS PCT: 8 % (ref 3.0–12.0)
Monocytes Absolute: 0.4 10*3/uL (ref 0.1–1.0)
NEUTROS ABS: 3.8 10*3/uL (ref 1.4–7.7)
Neutrophils Relative %: 70.4 % (ref 43.0–77.0)
PLATELETS: 212 10*3/uL (ref 150.0–400.0)
RBC: 3.7 Mil/uL — ABNORMAL LOW (ref 4.22–5.81)
RDW: 14.4 % (ref 11.5–15.5)
WBC: 5.3 10*3/uL (ref 4.0–10.5)

## 2017-12-22 LAB — IBC PANEL
Iron: 77 ug/dL (ref 42–165)
Saturation Ratios: 25 % (ref 20.0–50.0)
TRANSFERRIN: 220 mg/dL (ref 212.0–360.0)

## 2017-12-22 LAB — FERRITIN: Ferritin: 163.3 ng/mL (ref 22.0–322.0)

## 2017-12-22 LAB — FECAL OCCULT BLOOD, IMMUNOCHEMICAL: Fecal Occult Bld: NEGATIVE

## 2017-12-22 NOTE — Progress Notes (Signed)
Patient ID: [name], male   DOB: Feb 20, 1935, 83 y.o.   MRN: [id number] HPI: [name] is an 83 year old male with a history of celiac disease on gluten-free diet, GERD with history of Schatzki's ring causing esophageal dysphagia status post dilation, history of colonic diverticulosis, history of internal hemorrhoids status post banding IBS, hypertension, arthritis and osteoporosis who is seen in consultation at the request of [name] to establish care and for recent heme positive stool.  He is here today with his wife.  He reports that he has been feeling well but has been under stress lately as he has been preparing his home in [place], [place] to sale.  This is required considerable work to get the home ready for sale.  His wife is also had issues with her health leading to increased stress.  They are planning to move to the [facility] area to be closer to their daughter and son-in-law.  He follows a strict gluten-free diet and if he does have gluten exposure it is inadvertent.  Previous gluten exposure has led to abdominal pain which can be severe, bloating and loose stools.  Recently he denies abdominal pain.  His appetite fluctuates but he denies nausea and vomiting.  Occasionally he will feel that breads and meats move slowly through his esophagus but he denies frequent dysphagia and no odynophagia.  No nausea or vomiting.  Bowel movements have been regular for him without blood or melena.  He reports a home FOBT test through primary care was positive.  He does have a history of iron deficiency and has remained on oral iron.  His last colonoscopy was with [name] on 12/04/2015 --colonoscopy with excellent preparation to the cecum.  No colorectal neoplasia.  Extensive sigmoid diverticulosis.  Perianal skin rash.  Last upper endoscopy 03/17/2013 --no esophageal abnormality for dysphagia.  Esophagus dilated with 18 mm Savary.  Biopsied and negative for EoE.  Normal stomach and  examined duodenum.  Duodenal biopsies were normal in the second portion and in the bulb showed peptic type duodenopathy.  Past Medical History:  Diagnosis Date  . Anemia   . Arthritis    osteoarthritis  back  . Celiac disease   . Chronic kidney disease   . Diverticulosis   . Enlarged prostate    followed by dr  Dorina Prince  . Essential hypertension, benign 03/29/2014  . Full dentures   . GERD (gastroesophageal reflux disease)   . Hemorrhoids   . Hiatal hernia   . IBS (irritable bowel syndrome)   . Internal hemorrhoids   . Osteoporosis   . Postoperative anemia due to acute blood loss 03/29/2014  . Schatzki's ring   . Ulcer     Past Surgical History:  Procedure Laterality Date  . APPENDECTOMY  1963  . EYE SURGERY Bilateral    cataract removal - implants  . HERNIA REPAIR  2002   right inguinal   . INGUINAL HERNIA REPAIR  08/19/2011   Procedure: HERNIA REPAIR  left INGUINAL ADULT;  Surgeon: Charles Hollingshead, MD;  Location: [facility];  Service: General;  Laterality: Left;  . INTRAMEDULLARY (IM) NAIL INTERTROCHANTERIC Left 03/27/2014   Procedure: INTRAMEDULLARY (IM) NAIL INTERTROCHANTRIC;  Surgeon: Charles Pole, MD;  Location: [facility];  Service: Orthopedics;  Laterality: Left;  . RIGHT/LEFT HEART CATH AND CORONARY ANGIOGRAPHY N/A 03/12/2017   Procedure: Right/Left Heart Cath and Coronary Angiography;  Surgeon: Charles Artist, MD;  Location: [place] [facility];  Service: Cardiovascular;  Laterality: N/A;  Outpatient Medications Prior to Visit  Medication Sig Dispense Refill  . albuterol (PROVENTIL HFA;VENTOLIN HFA) 108 (90 Base) MCG/ACT inhaler Inhale 2 puffs into the lungs every 6 (six) hours as needed for wheezing or shortness of breath.    [name] alendronate (FOSAMAX) 70 MG tablet Take 70 mg by mouth once a week. Take with a full glass of water on an empty stomach.    [name] amLODipine (NORVASC) 10 MG tablet Take 10 mg by mouth daily.    [name] azelastine (ASTELIN) 0.1 % nasal spray Place 2  sprays into both nostrils daily. Use in each nostril as directed     . cholecalciferol (VITAMIN D) 1000 UNITS tablet Take 1,000 Units by mouth daily.      [name] docusate sodium 100 MG CAPS Take 100 mg by mouth 2 (two) times daily. (Patient taking differently: Take 100 mg by mouth daily as needed (constipation). ) 10 capsule 0  . doxazosin (CARDURA) 2 MG tablet Take 2 mg by mouth daily.    . ferrous sulfate 325 (65 FE) MG tablet Take 1 tablet (325 mg total) by mouth 3 (three) times daily after meals. (Patient taking differently: Take 325 mg by mouth daily. ) 90 tablet 1  . fluticasone (FLONASE) 50 MCG/ACT nasal spray Place 1 spray into both nostrils at bedtime.    . furosemide (LASIX) 40 MG tablet Take 40 mg by mouth daily.    [name] HYDROcodone-acetaminophen (NORCO) 5-325 MG per tablet Take 1-2 tablets by mouth every 6 (six) hours as needed. (Patient taking differently: Take 1 tablet by mouth 3 (three) times daily as needed for moderate pain. ) 90 tablet 0  . loratadine-pseudoephedrine (CLARITIN-D 24-HOUR) 10-240 MG 24 hr tablet Take 1 tablet by mouth at bedtime.    . meloxicam (MOBIC) 15 MG tablet Take 15 mg by mouth 2 (two) times daily.     . montelukast (SINGULAIR) 10 MG tablet Take 10 mg by mouth daily.     . Multiple Vitamin (MULTIVITAMIN) tablet Take 1 tablet by mouth daily.     [name] omeprazole (PRILOSEC) 40 MG capsule Take 40 mg by mouth 2 (two) times daily.     [name] rOPINIRole (REQUIP) 1 MG tablet Take 1 mg by mouth at bedtime.     [name] tiZANidine (ZANAFLEX) 4 MG tablet Take 4 mg by mouth at bedtime as needed for muscle spasms. For pain; 1 -2 tablets depending on pain level    . zolpidem (AMBIEN) 10 MG tablet Take 10 mg by mouth at bedtime as needed for sleep.    . polyethylene glycol (MIRALAX / GLYCOLAX) packet Take 17 g by mouth daily as needed for mild constipation. 14 each 0   No facility-administered medications prior to visit.     No Known Allergies  Family History  Problem Relation Age of Onset   . Cervical cancer Mother   . Colon cancer Mother   . Stroke Father   . Heart attack Father   . Breast cancer Sister   . Throat cancer Brother   . Liver cancer Brother   . Brain cancer Brother     Social History   Tobacco Use  . Smoking status: Former Smoker    Last attempt to quit: 08/11/1981    Years since quitting: 36.3  . Smokeless tobacco: Never Used  Substance Use Topics  . Alcohol use: No    Frequency: Never    Comment: beer- socially  . Drug use: No    ROS: As per history  of present illness, otherwise negative  BP (!) 130/58   Pulse 64   Ht 5\' 7"  (1.702 m)   Wt 140 lb (63.5 kg)   BMI 21.93 kg/m  Constitutional: Well-developed and well-nourished. No distress. HEENT: Normocephalic and atraumatic. Oropharynx is clear and moist. Conjunctivae are normal.  No scleral icterus. Neck: Neck supple. Trachea midline. Cardiovascular: Normal rate, regular rhythm and intact distal pulses. No M/R/G Pulmonary/chest: Effort normal and breath sounds normal. No wheezing, rales or rhonchi. Abdominal: Soft, nontender, nondistended. Bowel sounds active throughout. There are no masses palpable. No hepatosplenomegaly. Extremities: no clubbing, cyanosis, or edema Neurological: Alert and oriented to person place and time. Skin: Skin is warm and dry.  Psychiatric: Normal mood and affect. Behavior is normal.  RELEVANT LABS AND IMAGING: CBC    Component Value Date/Time   WBC 5.4 05/11/2017 1146   RBC 3.98 (L) 05/11/2017 1146   HGB 11.4 (L) 05/11/2017 1146   HCT 34.7 (L) 05/11/2017 1146   PLT 206 05/11/2017 1146   MCV 87.2 05/11/2017 1146   MCH 28.6 05/11/2017 1146   MCHC 32.9 05/11/2017 1146   RDW 14.3 05/11/2017 1146   LYMPHSABS 1.0 03/27/2014 1238   MONOABS 0.6 03/27/2014 1238   EOSABS 0.5 03/27/2014 1238   BASOSABS 0.0 03/27/2014 1238    CMP     Component Value Date/Time   NA 140 05/11/2017 1146   K 4.4 05/11/2017 1146   CL 106 05/11/2017 1146   CO2 29 05/11/2017  1146   GLUCOSE 80 05/11/2017 1146   BUN 15 05/11/2017 1146   CREATININE 1.36 (H) 05/11/2017 1146   CALCIUM 9.0 05/11/2017 1146   PROT 6.3 08/14/2011 1027   ALBUMIN 3.8 08/14/2011 1027   AST 18 08/14/2011 1027   ALT 12 08/14/2011 1027   ALKPHOS 60 08/14/2011 1027   BILITOT 0.5 08/14/2011 1027   GFRNONAA 48 (L) 05/11/2017 1146   GFRAA 55 (L) 05/11/2017 1146    ASSESSMENT/PLAN: 83 year old male with a history of celiac disease on gluten-free diet, GERD with history of Schatzki's ring causing esophageal dysphagia status post dilation, history of colonic diverticulosis, history of internal hemorrhoids status post banding IBS, hypertension, arthritis and osteoporosis who is seen in consultation at the request of [name] to establish care and for recent heme positive stool.    1. Heme + stools --he has no symptoms of overt or visible bleeding.  His last colonoscopy was 2 years ago with an excellent prep to the cecum with no colon polyps seen.  He also had an unremarkable upper endoscopy in 2014.  We discussed heme positive stool and the possible workup for this.  This would include repeat upper endoscopy and colonoscopy versus a more conservative approach.  After our discussion we will start with conservative approach by checking CBC and iron studies and also repeating FOBT x5.  If positive, my recommendation would be for upper endoscopy and colonoscopy.  He is happy with this plan.  It is reassuring to me that his colonoscopy was normal such a short time ago.  2.  Celiac disease --continue gluten-free diet.  Small bowel biopsies were normal in 2014 indicating disease remission.  We are checking iron studies and a TTG today  3.  GERD --we will continue his PPI therapy per [name].   ZO:XWRU, Leanna Sato, Md [address], [place] [id number]

## 2017-12-22 NOTE — Patient Instructions (Signed)
Your physician has requested that you go to the basement for the following lab work before leaving today: FOBT, CBC, IBC, Ferritin, TTG  If you are age 81 or older, your body mass index should be between 23-30. Your Body mass index is 21.93 kg/m. If this is out of the aforementioned range listed, please consider follow up with your Primary Care Provider.  If you are age 60 or younger, your body mass index should be between 19-25. Your Body mass index is 21.93 kg/m. If this is out of the aformentioned range listed, please consider follow up with your Primary Care Provider.

## 2017-12-23 DIAGNOSIS — G8929 Other chronic pain: Secondary | ICD-10-CM | POA: Diagnosis not present

## 2017-12-23 DIAGNOSIS — M810 Age-related osteoporosis without current pathological fracture: Secondary | ICD-10-CM | POA: Diagnosis not present

## 2017-12-23 DIAGNOSIS — R42 Dizziness and giddiness: Secondary | ICD-10-CM | POA: Diagnosis not present

## 2017-12-23 DIAGNOSIS — J3089 Other allergic rhinitis: Secondary | ICD-10-CM | POA: Diagnosis not present

## 2017-12-23 DIAGNOSIS — M1990 Unspecified osteoarthritis, unspecified site: Secondary | ICD-10-CM | POA: Diagnosis not present

## 2017-12-23 DIAGNOSIS — Z6821 Body mass index (BMI) 21.0-21.9, adult: Secondary | ICD-10-CM | POA: Diagnosis not present

## 2017-12-23 DIAGNOSIS — I10 Essential (primary) hypertension: Secondary | ICD-10-CM | POA: Diagnosis not present

## 2017-12-23 LAB — TISSUE TRANSGLUTAMINASE, IGA: (tTG) Ab, IgA: 1 U/mL

## 2017-12-24 NOTE — Addendum Note (Signed)
Addended by: Larina Bras on: 12/24/2017 09:54 AM   Modules accepted: Orders

## 2018-01-01 ENCOUNTER — Other Ambulatory Visit (INDEPENDENT_AMBULATORY_CARE_PROVIDER_SITE_OTHER): Payer: Medicare Other

## 2018-01-01 DIAGNOSIS — R195 Other fecal abnormalities: Secondary | ICD-10-CM

## 2018-01-01 DIAGNOSIS — K900 Celiac disease: Secondary | ICD-10-CM | POA: Diagnosis not present

## 2018-01-01 LAB — FECAL OCCULT BLOOD, IMMUNOCHEMICAL: Fecal Occult Bld: NEGATIVE

## 2018-03-24 DIAGNOSIS — F439 Reaction to severe stress, unspecified: Secondary | ICD-10-CM | POA: Diagnosis not present

## 2018-03-24 DIAGNOSIS — G8929 Other chronic pain: Secondary | ICD-10-CM | POA: Diagnosis not present

## 2018-03-24 DIAGNOSIS — R5383 Other fatigue: Secondary | ICD-10-CM | POA: Diagnosis not present

## 2018-03-24 DIAGNOSIS — Z6821 Body mass index (BMI) 21.0-21.9, adult: Secondary | ICD-10-CM | POA: Diagnosis not present

## 2018-03-24 DIAGNOSIS — G4733 Obstructive sleep apnea (adult) (pediatric): Secondary | ICD-10-CM | POA: Diagnosis not present

## 2018-03-24 DIAGNOSIS — I10 Essential (primary) hypertension: Secondary | ICD-10-CM | POA: Diagnosis not present

## 2018-03-24 DIAGNOSIS — Z1389 Encounter for screening for other disorder: Secondary | ICD-10-CM | POA: Diagnosis not present

## 2018-03-24 DIAGNOSIS — D6489 Other specified anemias: Secondary | ICD-10-CM | POA: Diagnosis not present

## 2018-03-24 DIAGNOSIS — M1990 Unspecified osteoarthritis, unspecified site: Secondary | ICD-10-CM | POA: Diagnosis not present

## 2018-03-24 DIAGNOSIS — K900 Celiac disease: Secondary | ICD-10-CM | POA: Diagnosis not present

## 2018-04-22 DIAGNOSIS — Z85828 Personal history of other malignant neoplasm of skin: Secondary | ICD-10-CM | POA: Diagnosis not present

## 2018-04-22 DIAGNOSIS — L570 Actinic keratosis: Secondary | ICD-10-CM | POA: Diagnosis not present

## 2018-04-22 DIAGNOSIS — L720 Epidermal cyst: Secondary | ICD-10-CM | POA: Diagnosis not present

## 2018-04-22 DIAGNOSIS — L821 Other seborrheic keratosis: Secondary | ICD-10-CM | POA: Diagnosis not present

## 2018-05-04 DIAGNOSIS — H52203 Unspecified astigmatism, bilateral: Secondary | ICD-10-CM | POA: Diagnosis not present

## 2018-05-04 DIAGNOSIS — H0100A Unspecified blepharitis right eye, upper and lower eyelids: Secondary | ICD-10-CM | POA: Diagnosis not present

## 2018-05-04 DIAGNOSIS — H43813 Vitreous degeneration, bilateral: Secondary | ICD-10-CM | POA: Diagnosis not present

## 2018-05-04 DIAGNOSIS — H26493 Other secondary cataract, bilateral: Secondary | ICD-10-CM | POA: Diagnosis not present

## 2018-06-23 DIAGNOSIS — M1990 Unspecified osteoarthritis, unspecified site: Secondary | ICD-10-CM | POA: Diagnosis not present

## 2018-06-23 DIAGNOSIS — Z6820 Body mass index (BMI) 20.0-20.9, adult: Secondary | ICD-10-CM | POA: Diagnosis not present

## 2018-06-23 DIAGNOSIS — F439 Reaction to severe stress, unspecified: Secondary | ICD-10-CM | POA: Diagnosis not present

## 2018-06-23 DIAGNOSIS — G8929 Other chronic pain: Secondary | ICD-10-CM | POA: Diagnosis not present

## 2018-06-23 DIAGNOSIS — K429 Umbilical hernia without obstruction or gangrene: Secondary | ICD-10-CM | POA: Diagnosis not present

## 2018-06-29 DIAGNOSIS — D044 Carcinoma in situ of skin of scalp and neck: Secondary | ICD-10-CM | POA: Diagnosis not present

## 2018-06-29 DIAGNOSIS — L821 Other seborrheic keratosis: Secondary | ICD-10-CM | POA: Diagnosis not present

## 2018-06-29 DIAGNOSIS — D485 Neoplasm of uncertain behavior of skin: Secondary | ICD-10-CM | POA: Diagnosis not present

## 2018-06-29 DIAGNOSIS — Z85828 Personal history of other malignant neoplasm of skin: Secondary | ICD-10-CM | POA: Diagnosis not present

## 2018-06-29 DIAGNOSIS — L570 Actinic keratosis: Secondary | ICD-10-CM | POA: Diagnosis not present

## 2018-07-19 DIAGNOSIS — Z23 Encounter for immunization: Secondary | ICD-10-CM | POA: Diagnosis not present

## 2018-07-21 ENCOUNTER — Telehealth: Payer: Self-pay | Admitting: *Deleted

## 2018-07-21 DIAGNOSIS — G4733 Obstructive sleep apnea (adult) (pediatric): Secondary | ICD-10-CM

## 2018-07-21 NOTE — Telephone Encounter (Signed)
Please change to auto CPAP from 4 to 18cm H2O and have him call in a few weeks to let us know if things are better

## 2018-07-21 NOTE — Telephone Encounter (Addendum)
Order placed to [facility] today. Patient notified.

## 2018-07-21 NOTE — Telephone Encounter (Signed)
Patient called stating he feels like he is smothering after 15 minutes of wearing his cpap and he has to remove it. Patient does not think it is blowing too hard. Oddly enough,  he thinks he is still getting enough air.

## 2018-07-22 NOTE — Telephone Encounter (Signed)
Auto CPAP titrating machine for 2 weeks from 4 to 18cm H2O and get a dowlnoad in 2 weeks

## 2018-07-22 NOTE — Telephone Encounter (Signed)
Per [facility]  patient does not have a auto titration machine please write for a 2 week auto titration from 4-18 cm H20 and 2 week download

## 2018-07-26 NOTE — Addendum Note (Signed)
Addended by: Freada Bergeron on: 07/26/2018 11:15 AM   Modules accepted: Orders

## 2018-07-26 NOTE — Telephone Encounter (Signed)
Per Dr Radford Pax order placed to [facility] for Auto CPAP titrating machine for 2 weeks from 4 to 18cm H2O and get a dowlnoad in 2 weeks

## 2018-07-26 NOTE — Addendum Note (Signed)
Addended by: Freada Bergeron on: 07/26/2018 11:43 AM   Modules accepted: Orders

## 2018-08-12 DIAGNOSIS — M7062 Trochanteric bursitis, left hip: Secondary | ICD-10-CM | POA: Diagnosis not present

## 2018-08-12 DIAGNOSIS — M25552 Pain in left hip: Secondary | ICD-10-CM | POA: Diagnosis not present

## 2018-08-17 ENCOUNTER — Telehealth: Payer: Self-pay | Admitting: *Deleted

## 2018-08-17 NOTE — Telephone Encounter (Signed)
-----   Message from Sueanne Margarita, MD sent at 08/17/2018 12:01 PM EST ----- Good AHI on PAP but needs to improve compliance

## 2018-08-17 NOTE — Telephone Encounter (Signed)
Informed patient of compliance results and verbalized understanding was indicated. Patient is aware and agreeable to AHI being within range at 1.7. Patient is aware and agreeable to improving compliance with machine usage.

## 2018-08-26 DIAGNOSIS — M25552 Pain in left hip: Secondary | ICD-10-CM | POA: Diagnosis not present

## 2018-08-30 ENCOUNTER — Ambulatory Visit: Payer: Medicare Other | Admitting: Cardiology

## 2018-09-03 DIAGNOSIS — M25552 Pain in left hip: Secondary | ICD-10-CM | POA: Diagnosis not present

## 2018-09-07 DIAGNOSIS — M25552 Pain in left hip: Secondary | ICD-10-CM | POA: Diagnosis not present

## 2018-09-14 DIAGNOSIS — M25552 Pain in left hip: Secondary | ICD-10-CM | POA: Diagnosis not present

## 2018-09-17 DIAGNOSIS — M25552 Pain in left hip: Secondary | ICD-10-CM | POA: Diagnosis not present

## 2018-09-21 DIAGNOSIS — M25552 Pain in left hip: Secondary | ICD-10-CM | POA: Diagnosis not present

## 2018-09-23 DIAGNOSIS — M25552 Pain in left hip: Secondary | ICD-10-CM | POA: Diagnosis not present

## 2018-09-29 DIAGNOSIS — R82998 Other abnormal findings in urine: Secondary | ICD-10-CM | POA: Diagnosis not present

## 2018-09-29 DIAGNOSIS — E7849 Other hyperlipidemia: Secondary | ICD-10-CM | POA: Diagnosis not present

## 2018-09-29 DIAGNOSIS — M810 Age-related osteoporosis without current pathological fracture: Secondary | ICD-10-CM | POA: Diagnosis not present

## 2018-09-29 DIAGNOSIS — I10 Essential (primary) hypertension: Secondary | ICD-10-CM | POA: Diagnosis not present

## 2018-09-29 DIAGNOSIS — Z125 Encounter for screening for malignant neoplasm of prostate: Secondary | ICD-10-CM | POA: Diagnosis not present

## 2018-10-01 DIAGNOSIS — M25552 Pain in left hip: Secondary | ICD-10-CM | POA: Diagnosis not present

## 2018-10-04 DIAGNOSIS — N5201 Erectile dysfunction due to arterial insufficiency: Secondary | ICD-10-CM | POA: Diagnosis not present

## 2018-10-04 DIAGNOSIS — N401 Enlarged prostate with lower urinary tract symptoms: Secondary | ICD-10-CM | POA: Diagnosis not present

## 2018-10-04 DIAGNOSIS — R351 Nocturia: Secondary | ICD-10-CM | POA: Diagnosis not present

## 2018-10-08 DIAGNOSIS — M25552 Pain in left hip: Secondary | ICD-10-CM | POA: Diagnosis not present

## 2018-10-08 DIAGNOSIS — M7062 Trochanteric bursitis, left hip: Secondary | ICD-10-CM | POA: Diagnosis not present

## 2018-10-15 DIAGNOSIS — G2581 Restless legs syndrome: Secondary | ICD-10-CM | POA: Diagnosis not present

## 2018-10-15 DIAGNOSIS — Z1331 Encounter for screening for depression: Secondary | ICD-10-CM | POA: Diagnosis not present

## 2018-10-15 DIAGNOSIS — Z0000 Encounter for general adult medical examination without abnormal findings: Secondary | ICD-10-CM | POA: Diagnosis not present

## 2018-10-15 DIAGNOSIS — E7849 Other hyperlipidemia: Secondary | ICD-10-CM | POA: Diagnosis not present

## 2018-10-15 DIAGNOSIS — Z6821 Body mass index (BMI) 21.0-21.9, adult: Secondary | ICD-10-CM | POA: Diagnosis not present

## 2018-10-15 DIAGNOSIS — K219 Gastro-esophageal reflux disease without esophagitis: Secondary | ICD-10-CM | POA: Diagnosis not present

## 2018-10-15 DIAGNOSIS — G4733 Obstructive sleep apnea (adult) (pediatric): Secondary | ICD-10-CM | POA: Diagnosis not present

## 2018-10-15 DIAGNOSIS — Z1389 Encounter for screening for other disorder: Secondary | ICD-10-CM | POA: Diagnosis not present

## 2018-10-15 DIAGNOSIS — M810 Age-related osteoporosis without current pathological fracture: Secondary | ICD-10-CM | POA: Diagnosis not present

## 2018-10-15 DIAGNOSIS — F439 Reaction to severe stress, unspecified: Secondary | ICD-10-CM | POA: Diagnosis not present

## 2018-10-15 DIAGNOSIS — G8929 Other chronic pain: Secondary | ICD-10-CM | POA: Diagnosis not present

## 2018-10-15 DIAGNOSIS — G4709 Other insomnia: Secondary | ICD-10-CM | POA: Diagnosis not present

## 2018-10-15 DIAGNOSIS — I10 Essential (primary) hypertension: Secondary | ICD-10-CM | POA: Diagnosis not present

## 2018-10-17 ENCOUNTER — Encounter: Payer: Self-pay | Admitting: Cardiology

## 2018-10-17 DIAGNOSIS — Z9989 Dependence on other enabling machines and devices: Secondary | ICD-10-CM

## 2018-10-17 DIAGNOSIS — G4733 Obstructive sleep apnea (adult) (pediatric): Secondary | ICD-10-CM

## 2018-10-17 HISTORY — DX: Obstructive sleep apnea (adult) (pediatric): G47.33

## 2018-10-17 NOTE — Progress Notes (Deleted)
Cardiology Office Note:    Date:  10/17/2018   ID:  [name], DOB 1935/02/05, MRN [id number]  PCP:  Rigoberto Noel, MD  Cardiologist:  No primary care provider on file.    Referring MD: Rigoberto Noel, MD   No chief complaint on file.   History of Present Illness:    [name] is a 84 y.o. male with a hx of CKD, HTN and CHF and mild OSA with an AHI of 12/hr and oxygen desaturations as low as 79%.  He underwent CPAP titration to 9cm H2O.   He tolerates the mask and feels the pressure is adequate.  Since going on CPAP he feels rested in the am and has no significant daytime sleepiness.  He denies any significant mouth or nasal dryness or nasal congestion.  He does not think that he snores.     Past Medical History:  Diagnosis Date  . Anemia   . Arthritis    osteoarthritis  back  . Celiac disease   . Chronic kidney disease   . Diverticulosis   . Enlarged prostate    followed by dr  Dorina Hoyer  . Essential hypertension, benign 03/29/2014  . Full dentures   . GERD (gastroesophageal reflux disease)   . Hemorrhoids   . Hiatal hernia   . IBS (irritable bowel syndrome)   . Internal hemorrhoids   . OSA on CPAP 10/17/2018  . Osteoporosis   . Postoperative anemia due to acute blood loss 03/29/2014  . Schatzki's ring   . Ulcer     Past Surgical History:  Procedure Laterality Date  . APPENDECTOMY  1963  . EYE SURGERY Bilateral    cataract removal - implants  . HERNIA REPAIR  2002   right inguinal   . INGUINAL HERNIA REPAIR  08/19/2011   Procedure: HERNIA REPAIR  left INGUINAL ADULT;  Surgeon: Odis Hollingshead, MD;  Location: [facility];  Service: General;  Laterality: Left;  . INTRAMEDULLARY (IM) NAIL INTERTROCHANTERIC Left 03/27/2014   Procedure: INTRAMEDULLARY (IM) NAIL INTERTROCHANTRIC;  Surgeon: Mauri Pole, MD;  Location: [facility];  Service: Orthopedics;  Laterality: Left;  . RIGHT/LEFT HEART CATH AND CORONARY ANGIOGRAPHY N/A 03/12/2017   Procedure: Right/Left Heart Cath  and Coronary Angiography;  Surgeon: Jolaine Artist, MD;  Location: [facility];  Service: Cardiovascular;  Laterality: N/A;    Current Medications: No outpatient medications have been marked as taking for the 10/18/18 encounter (Appointment) with Sueanne Margarita, MD.     Allergies:   Patient has no known allergies.   Social History   Socioeconomic History  . Marital status: Married    Spouse name: Not on file  . Number of children: 3  . Years of education: Not on file  . Highest education level: Not on file  Occupational History  . Not on file  Social Needs  . Financial resource strain: Not on file  . Food insecurity:    Worry: Not on file    Inability: Not on file  . Transportation needs:    Medical: Not on file    Non-medical: Not on file  Tobacco Use  . Smoking status: Former Smoker    Last attempt to quit: 08/11/1981    Years since quitting: 37.2  . Smokeless tobacco: Never Used  Substance and Sexual Activity  . Alcohol use: No    Frequency: Never    Comment: beer- socially  . Drug use: No  . Sexual activity: Not  on file  Lifestyle  . Physical activity:    Days per week: Not on file    Minutes per session: Not on file  . Stress: Not on file  Relationships  . Social connections:    Talks on phone: Not on file    Gets together: Not on file    Attends religious service: Not on file    Active member of club or organization: Not on file    Attends meetings of clubs or organizations: Not on file    Relationship status: Not on file  Other Topics Concern  . Not on file  Social History Narrative  . Not on file     Family History: The patient's family history includes Brain cancer in his brother; Breast cancer in his sister; Cervical cancer in his mother; Colon cancer in his mother; Heart attack in his father; Liver cancer in his brother; Stroke in his father; Throat cancer in his brother.  ROS:   Please see the history of present illness.    ROS    All other systems reviewed and negative.   EKGs/Labs/Other Studies Reviewed:    The following studies were reviewed today: PAP download  EKG:  EKG is not ordered today.   Recent Labs: 12/22/2017: Hemoglobin 11.4; Platelets 212.0   Recent Lipid Panel    Component Value Date/Time   CHOL  04/25/2009 0155    134        ATP III CLASSIFICATION:  <200     mg/dL   Desirable  200-239  mg/dL   Borderline High  >=240    mg/dL   High          TRIG 57 04/25/2009 0155   HDL 73 04/25/2009 0155   CHOLHDL 1.8 04/25/2009 0155   VLDL 11 04/25/2009 0155   LDLCALC  04/25/2009 0155    50        Total Cholesterol/HDL:CHD Risk Coronary Heart Disease Risk Table                     Men   Women  1/2 Average Risk   3.4   3.3  Average Risk       5.0   4.4  2 X Average Risk   9.6   7.1  3 X Average Risk  23.4   11.0        Use the calculated Patient Ratio above and the CHD Risk Table to determine the patient's CHD Risk.        ATP III CLASSIFICATION (LDL):  <100     mg/dL   Optimal  100-129  mg/dL   Near or Above                    Optimal  130-159  mg/dL   Borderline  160-189  mg/dL   High  >190     mg/dL   Very High    Physical Exam:    VS:  There were no vitals taken for this visit.    Wt Readings from Last 3 Encounters:  12/22/17 140 lb (63.5 kg)  08/24/17 145 lb 6.4 oz (66 kg)  05/11/17 139 lb 8 oz (63.3 kg)     GEN:  Well nourished, well developed in no acute distress HEENT: Normal NECK: No JVD; No carotid bruits LYMPHATICS: No lymphadenopathy CARDIAC: RRR, no murmurs, rubs, gallops RESPIRATORY:  Clear to auscultation without rales, wheezing or rhonchi  ABDOMEN: Soft, non-tender, non-distended MUSCULOSKELETAL:  No  edema; No deformity  SKIN: Warm and dry NEUROLOGIC:  Alert and oriented x 3 PSYCHIATRIC:  Normal affect   ASSESSMENT:    1. Essential hypertension, benign   2. OSA on CPAP    PLAN:    In order of problems listed above:  1.  OSA - the patient is  tolerating PAP therapy well without any problems. The PAP download was reviewed today and showed an AHI of ***/hr on *** cm H2O with ***% compliance in using more than 4 hours nightly.  The patient has been using and benefiting from PAP use and will continue to benefit from therapy.   2  HTN - BP is controlled on exam today.  He will continue on amlodipine 42m daily and doxazonsin 240mdaily     Medication Adjustments/Labs and Tests Ordered: Current medicines are reviewed at length with the patient today.  Concerns regarding medicines are outlined above.  No orders of the defined types were placed in this encounter.  No orders of the defined types were placed in this encounter.   Signed, TrFransico HimMD  10/17/2018 9:01 PM    [place]

## 2018-10-18 ENCOUNTER — Ambulatory Visit: Payer: Medicare Other | Admitting: Cardiology

## 2018-10-19 ENCOUNTER — Encounter: Payer: Self-pay | Admitting: Cardiology

## 2018-10-21 DIAGNOSIS — L570 Actinic keratosis: Secondary | ICD-10-CM | POA: Diagnosis not present

## 2018-10-21 DIAGNOSIS — C44729 Squamous cell carcinoma of skin of left lower limb, including hip: Secondary | ICD-10-CM | POA: Diagnosis not present

## 2018-10-21 DIAGNOSIS — Z1212 Encounter for screening for malignant neoplasm of rectum: Secondary | ICD-10-CM | POA: Diagnosis not present

## 2018-10-21 DIAGNOSIS — Z85828 Personal history of other malignant neoplasm of skin: Secondary | ICD-10-CM | POA: Diagnosis not present

## 2018-10-21 DIAGNOSIS — D485 Neoplasm of uncertain behavior of skin: Secondary | ICD-10-CM | POA: Diagnosis not present

## 2018-11-12 ENCOUNTER — Telehealth: Payer: Self-pay | Admitting: *Deleted

## 2018-11-12 NOTE — Telephone Encounter (Signed)
-----   Message from Sueanne Margarita, MD sent at 11/11/2018  2:23 PM EST ----- Good AHI on PAP but needs to improve compliance

## 2018-11-12 NOTE — Telephone Encounter (Signed)
Informed patient of compliancePatient is aware and agreeable to AHI being within range at 2.3. Patient is aware and agreeable to improving in compliance with machine usage

## 2018-12-08 ENCOUNTER — Encounter: Payer: Self-pay | Admitting: Cardiology

## 2018-12-08 ENCOUNTER — Ambulatory Visit (INDEPENDENT_AMBULATORY_CARE_PROVIDER_SITE_OTHER): Payer: Medicare Other | Admitting: Cardiology

## 2018-12-08 VITALS — BP 124/58 | HR 72 | Ht 67.0 in | Wt 139.0 lb

## 2018-12-08 DIAGNOSIS — Z9989 Dependence on other enabling machines and devices: Secondary | ICD-10-CM

## 2018-12-08 DIAGNOSIS — I10 Essential (primary) hypertension: Secondary | ICD-10-CM

## 2018-12-08 DIAGNOSIS — G4733 Obstructive sleep apnea (adult) (pediatric): Secondary | ICD-10-CM | POA: Diagnosis not present

## 2018-12-08 NOTE — Progress Notes (Signed)
Cardiology Office Note: He   Date:  12/08/2018   ID:  [name], DOB 21-May-1935, MRN [id number]  PCP:  Rigoberto Noel, MD  Cardiologist:  No primary care provider on file.    Referring MD: Rigoberto Noel, MD   Chief Complaint  Patient presents with  . Sleep Apnea  . Hypertension    History of Present Illness:    [name] is a 84 y.o. male with a hx of mild OSA with an AHI of 12/hr and oxygen desaturations as low as 79%.  He is on CPAP at  9cm H2O.  He is doing well with his CPAP device and thinks that he has gotten used to it.  He tolerates the mask and feels the pressure is adequate.  Since going on CPAP he feels rested in the am and has no significant daytime sleepiness.  He denies any significant mouth or nasal dryness or nasal congestion.  He does not think that he snores.     Past Medical History:  Diagnosis Date  . Anemia   . Arthritis    osteoarthritis  back  . Celiac disease   . Chronic kidney disease   . Diverticulosis   . Enlarged prostate    followed by dr  Dorina Hoyer  . Essential hypertension, benign 03/29/2014  . Full dentures   . GERD (gastroesophageal reflux disease)   . Hemorrhoids   . Hiatal hernia   . IBS (irritable bowel syndrome)   . Internal hemorrhoids   . OSA on CPAP 10/17/2018  . Osteoporosis   . Postoperative anemia due to acute blood loss 03/29/2014  . Schatzki's ring   . Ulcer     Past Surgical History:  Procedure Laterality Date  . APPENDECTOMY  1963  . EYE SURGERY Bilateral    cataract removal - implants  . HERNIA REPAIR  2002   right inguinal   . INGUINAL HERNIA REPAIR  08/19/2011   Procedure: HERNIA REPAIR  left INGUINAL ADULT;  Surgeon: Odis Hollingshead, MD;  Location: [facility];  Service: General;  Laterality: Left;  . INTRAMEDULLARY (IM) NAIL INTERTROCHANTERIC Left 03/27/2014   Procedure: INTRAMEDULLARY (IM) NAIL INTERTROCHANTRIC;  Surgeon: Mauri Pole, MD;  Location: [facility];  Service: Orthopedics;  Laterality: Left;  .  RIGHT/LEFT HEART CATH AND CORONARY ANGIOGRAPHY N/A 03/12/2017   Procedure: Right/Left Heart Cath and Coronary Angiography;  Surgeon: Jolaine Artist, MD;  Location: [facility];  Service: Cardiovascular;  Laterality: N/A;    Current Medications: Current Meds  Medication Sig  . albuterol (PROVENTIL HFA;VENTOLIN HFA) 108 (90 Base) MCG/ACT inhaler Inhale 2 puffs into the lungs every 6 (six) hours as needed for wheezing or shortness of breath.  Marland Kitchen alendronate (FOSAMAX) 70 MG tablet Take 70 mg by mouth once a week. Take with a full glass of water on an empty stomach.  Marland Kitchen amLODipine (NORVASC) 10 MG tablet Take 10 mg by mouth daily.  Marland Kitchen aspirin EC 81 MG tablet Take 81 mg by mouth daily.  Marland Kitchen azelastine (ASTELIN) 0.1 % nasal spray Place 2 sprays into both nostrils daily. Use in each nostril as directed   . cholecalciferol (VITAMIN D) 1000 UNITS tablet Take 1,000 Units by mouth daily.    Marland Kitchen docusate sodium 100 MG CAPS Take 100 mg by mouth 2 (two) times daily. (Patient taking differently: Take 100 mg by mouth daily as needed (constipation). )  . doxazosin (CARDURA) 2 MG tablet Take 2 mg by mouth daily.  Marland Kitchen  ferrous sulfate 325 (65 FE) MG tablet Take 1 tablet (325 mg total) by mouth 3 (three) times daily after meals. (Patient taking differently: Take 325 mg by mouth daily. )  . fluticasone (FLONASE) 50 MCG/ACT nasal spray Place 1 spray into both nostrils at bedtime.  . furosemide (LASIX) 40 MG tablet Take 40 mg by mouth daily.  Marland Kitchen HYDROcodone-acetaminophen (NORCO) 5-325 MG per tablet Take 1-2 tablets by mouth every 6 (six) hours as needed. (Patient taking differently: Take 1 tablet by mouth 3 (three) times daily as needed for moderate pain. )  . loratadine-pseudoephedrine (CLARITIN-D 24-HOUR) 10-240 MG 24 hr tablet Take 1 tablet by mouth at bedtime.  . meloxicam (MOBIC) 15 MG tablet Take 15 mg by mouth 2 (two) times daily.   . montelukast (SINGULAIR) 10 MG tablet Take 10 mg by mouth daily.   . Multiple  Vitamin (MULTIVITAMIN) tablet Take 1 tablet by mouth daily.   Marland Kitchen omeprazole (PRILOSEC) 40 MG capsule Take 40 mg by mouth 2 (two) times daily.   . pregabalin (LYRICA) 100 MG capsule Take 100 mg by mouth daily.   Marland Kitchen rOPINIRole (REQUIP) 1 MG tablet Take 1 mg by mouth at bedtime.   Marland Kitchen tiZANidine (ZANAFLEX) 4 MG tablet Take 4 mg by mouth at bedtime as needed for muscle spasms. For pain; 1 -2 tablets depending on pain level  . zolpidem (AMBIEN) 10 MG tablet Take 10 mg by mouth at bedtime as needed for sleep.     Allergies:   Patient has no known allergies.   Social History   Socioeconomic History  . Marital status: Married    Spouse name: Not on file  . Number of children: 3  . Years of education: Not on file  . Highest education level: Not on file  Occupational History  . Not on file  Social Needs  . Financial resource strain: Not on file  . Food insecurity:    Worry: Not on file    Inability: Not on file  . Transportation needs:    Medical: Not on file    Non-medical: Not on file  Tobacco Use  . Smoking status: Former Smoker    Last attempt to quit: 08/11/1981    Years since quitting: 37.3  . Smokeless tobacco: Never Used  Substance and Sexual Activity  . Alcohol use: No    Frequency: Never    Comment: beer- socially  . Drug use: No  . Sexual activity: Not on file  Lifestyle  . Physical activity:    Days per week: Not on file    Minutes per session: Not on file  . Stress: Not on file  Relationships  . Social connections:    Talks on phone: Not on file    Gets together: Not on file    Attends religious service: Not on file    Active member of club or organization: Not on file    Attends meetings of clubs or organizations: Not on file    Relationship status: Not on file  Other Topics Concern  . Not on file  Social History Narrative  . Not on file     Family History: The patient's family history includes Brain cancer in his brother; Breast cancer in his sister;  Cervical cancer in his mother; Colon cancer in his mother; Heart attack in his father; Liver cancer in his brother; Stroke in his father; Throat cancer in his brother.  ROS:   Please see the history of present illness.  ROS  All other systems reviewed and negative.   EKGs/Labs/Other Studies Reviewed:    The following studies were reviewed today: PAP download  EKG:  EKG is not ordered today.    Recent Labs: 12/22/2017: Hemoglobin 11.4; Platelets 212.0   Recent Lipid Panel    Component Value Date/Time   CHOL  04/25/2009 0155    134        ATP III CLASSIFICATION:  <200     mg/dL   Desirable  200-239  mg/dL   Borderline High  >=240    mg/dL   High          TRIG 57 04/25/2009 0155   HDL 73 04/25/2009 0155   CHOLHDL 1.8 04/25/2009 0155   VLDL 11 04/25/2009 0155   LDLCALC  04/25/2009 0155    50        Total Cholesterol/HDL:CHD Risk Coronary Heart Disease Risk Table                     Men   Women  1/2 Average Risk   3.4   3.3  Average Risk       5.0   4.4  2 X Average Risk   9.6   7.1  3 X Average Risk  23.4   11.0        Use the calculated Patient Ratio above and the CHD Risk Table to determine the patient's CHD Risk.        ATP III CLASSIFICATION (LDL):  <100     mg/dL   Optimal  100-129  mg/dL   Near or Above                    Optimal  130-159  mg/dL   Borderline  160-189  mg/dL   High  >190     mg/dL   Very High    Physical Exam:    VS:  BP (!) 124/58   Pulse 72   Ht 5' 7"  (1.702 m)   Wt 139 lb (63 kg)   BMI 21.77 kg/m     Wt Readings from Last 3 Encounters:  12/08/18 139 lb (63 kg)  12/22/17 140 lb (63.5 kg)  08/24/17 145 lb 6.4 oz (66 kg)     GEN:  Well nourished, well developed in no acute distress HEENT: Normal NECK: No JVD; No carotid bruits LYMPHATICS: No lymphadenopathy CARDIAC: RRR, no murmurs, rubs, gallops RESPIRATORY:  Clear to auscultation without rales, wheezing or rhonchi  ABDOMEN: Soft, non-tender,  non-distended MUSCULOSKELETAL:  No edema; No deformity  SKIN: Warm and dry NEUROLOGIC:  Alert and oriented x 3 PSYCHIATRIC:  Normal affect   ASSESSMENT:    1. OSA on CPAP   2. Essential hypertension, benign    PLAN:    In order of problems listed above:  1.  OSA - the patient is tolerating PAP therapy well without any problems. The PAP download was reviewed today and showed an AHI of 1.9/hr on 9 cm H2O with 50% compliance in using more than 4 hours nightly.  The patient has been using and benefiting from PAP use and will continue to benefit from therapy.   2. HTN - BP is controlled on exam today.  He will continue on Amlodipine 32m daily and doxazosin 235mdaily.     Medication Adjustments/Labs and Tests Ordered: Current medicines are reviewed at length with the patient today.  Concerns regarding medicines are outlined above.  No orders of the  defined types were placed in this encounter.  No orders of the defined types were placed in this encounter.   Signed, Fransico Him, MD  12/08/2018 8:13 AM    [place]

## 2018-12-08 NOTE — Patient Instructions (Signed)

## 2019-01-13 DIAGNOSIS — R05 Cough: Secondary | ICD-10-CM | POA: Diagnosis not present

## 2019-01-13 DIAGNOSIS — J309 Allergic rhinitis, unspecified: Secondary | ICD-10-CM | POA: Diagnosis not present

## 2019-01-13 DIAGNOSIS — G8929 Other chronic pain: Secondary | ICD-10-CM | POA: Diagnosis not present

## 2019-01-13 DIAGNOSIS — G4733 Obstructive sleep apnea (adult) (pediatric): Secondary | ICD-10-CM | POA: Diagnosis not present

## 2019-02-18 DIAGNOSIS — I10 Essential (primary) hypertension: Secondary | ICD-10-CM | POA: Diagnosis not present

## 2019-02-18 DIAGNOSIS — R197 Diarrhea, unspecified: Secondary | ICD-10-CM | POA: Diagnosis not present

## 2019-04-13 DIAGNOSIS — I10 Essential (primary) hypertension: Secondary | ICD-10-CM | POA: Diagnosis not present

## 2019-04-13 DIAGNOSIS — G8929 Other chronic pain: Secondary | ICD-10-CM | POA: Diagnosis not present

## 2019-04-13 DIAGNOSIS — F439 Reaction to severe stress, unspecified: Secondary | ICD-10-CM | POA: Diagnosis not present

## 2019-04-13 DIAGNOSIS — K900 Celiac disease: Secondary | ICD-10-CM | POA: Diagnosis not present

## 2019-04-13 DIAGNOSIS — E785 Hyperlipidemia, unspecified: Secondary | ICD-10-CM | POA: Diagnosis not present

## 2019-04-13 DIAGNOSIS — G4733 Obstructive sleep apnea (adult) (pediatric): Secondary | ICD-10-CM | POA: Diagnosis not present

## 2019-04-13 DIAGNOSIS — M1990 Unspecified osteoarthritis, unspecified site: Secondary | ICD-10-CM | POA: Diagnosis not present

## 2019-04-26 ENCOUNTER — Telehealth: Payer: Self-pay | Admitting: Internal Medicine

## 2019-04-26 DIAGNOSIS — D1801 Hemangioma of skin and subcutaneous tissue: Secondary | ICD-10-CM | POA: Diagnosis not present

## 2019-04-26 DIAGNOSIS — L821 Other seborrheic keratosis: Secondary | ICD-10-CM | POA: Diagnosis not present

## 2019-04-26 DIAGNOSIS — L570 Actinic keratosis: Secondary | ICD-10-CM | POA: Diagnosis not present

## 2019-04-26 DIAGNOSIS — Z85828 Personal history of other malignant neoplasm of skin: Secondary | ICD-10-CM | POA: Diagnosis not present

## 2019-04-26 NOTE — Telephone Encounter (Signed)
Pts wife states that everything her husband eats runs right through him. States he is having issues with diarrhea. States PCP told him to take pepto and Imodium but he continues to have issues. Pt scheduled to see Dr. Hilarie Fredrickson 05/30/19@3 :40pm. Wife aware of appts.

## 2019-04-26 NOTE — Telephone Encounter (Signed)
stomach pains, diarrhea 5-6times a day, cant keep food down/scheudle with patient wife/bb please call patient wife.

## 2019-04-28 DIAGNOSIS — I10 Essential (primary) hypertension: Secondary | ICD-10-CM | POA: Diagnosis not present

## 2019-05-06 ENCOUNTER — Telehealth: Payer: Self-pay | Admitting: Internal Medicine

## 2019-05-06 NOTE — Telephone Encounter (Signed)
Wife reported that pt has diarrhea and weight loss.  She requested a sooner apt with PA.  OK to book for 2 PM slot with Amy?

## 2019-05-06 NOTE — Telephone Encounter (Signed)
Yes, thanks

## 2019-05-18 ENCOUNTER — Telehealth: Payer: Self-pay | Admitting: Internal Medicine

## 2019-05-18 DIAGNOSIS — R197 Diarrhea, unspecified: Secondary | ICD-10-CM | POA: Diagnosis not present

## 2019-05-18 DIAGNOSIS — I10 Essential (primary) hypertension: Secondary | ICD-10-CM | POA: Diagnosis not present

## 2019-05-18 DIAGNOSIS — K921 Melena: Secondary | ICD-10-CM | POA: Diagnosis not present

## 2019-05-18 DIAGNOSIS — K900 Celiac disease: Secondary | ICD-10-CM | POA: Diagnosis not present

## 2019-05-18 NOTE — Telephone Encounter (Signed)
Pt having multiple diarrhea stools that are dark. Requesting pt be seen asap. Pt scheduled to see Amy [place] AP tomorrow at 4pm, PCP office to notify pt of appt.

## 2019-05-19 ENCOUNTER — Encounter: Payer: Self-pay | Admitting: Gastroenterology

## 2019-05-19 ENCOUNTER — Other Ambulatory Visit (INDEPENDENT_AMBULATORY_CARE_PROVIDER_SITE_OTHER): Payer: Medicare Other

## 2019-05-19 ENCOUNTER — Ambulatory Visit (INDEPENDENT_AMBULATORY_CARE_PROVIDER_SITE_OTHER): Payer: Medicare Other | Admitting: Gastroenterology

## 2019-05-19 VITALS — BP 90/48 | HR 88 | Temp 98.1°F | Ht 67.0 in | Wt 137.8 lb

## 2019-05-19 DIAGNOSIS — R197 Diarrhea, unspecified: Secondary | ICD-10-CM | POA: Diagnosis not present

## 2019-05-19 DIAGNOSIS — K529 Noninfective gastroenteritis and colitis, unspecified: Secondary | ICD-10-CM | POA: Insufficient documentation

## 2019-05-19 LAB — CBC WITH DIFFERENTIAL/PLATELET
Basophils Absolute: 0.1 10*3/uL (ref 0.0–0.1)
Basophils Relative: 1.4 % (ref 0.0–3.0)
Eosinophils Absolute: 0.3 10*3/uL (ref 0.0–0.7)
Eosinophils Relative: 5.4 % — ABNORMAL HIGH (ref 0.0–5.0)
HCT: 33.2 % — ABNORMAL LOW (ref 39.0–52.0)
Hemoglobin: 11 g/dL — ABNORMAL LOW (ref 13.0–17.0)
Lymphocytes Relative: 23.9 % (ref 12.0–46.0)
Lymphs Abs: 1.2 10*3/uL (ref 0.7–4.0)
MCHC: 33.1 g/dL (ref 30.0–36.0)
MCV: 89.9 fl (ref 78.0–100.0)
Monocytes Absolute: 0.6 10*3/uL (ref 0.1–1.0)
Monocytes Relative: 12.3 % — ABNORMAL HIGH (ref 3.0–12.0)
Neutro Abs: 2.8 10*3/uL (ref 1.4–7.7)
Neutrophils Relative %: 57 % (ref 43.0–77.0)
Platelets: 207 10*3/uL (ref 150.0–400.0)
RBC: 3.69 Mil/uL — ABNORMAL LOW (ref 4.22–5.81)
RDW: 13.8 % (ref 11.5–15.5)
WBC: 4.9 10*3/uL (ref 4.0–10.5)

## 2019-05-19 NOTE — Progress Notes (Addendum)
05/19/2019 [name] [id number] Feb 12, 1935   HISTORY OF PRESENT ILLNESS: This is an 84 year old male who is a patient of Dr. [name].  He has celiac disease on gluten-free diet.  He presents to our office today for complaints of diarrhea.  This is been occurring now for the past 2 months.  Has diarrhea 5-6 times daily.  Does not see any blood, but notes that his black "Jell-O" looking stool.  Says it has a very foul odor.  Reports a generalized abdominal cramping pain worse before bowel movements.  He has had a lot of urgency and incontinence episodes.  He reports occasional nocturnal stools.  He tried Pepto-Bismol in the beginning, but did not get much relief from that.  He then tried Imodium with minimal help as well.  His PCP apparently did not do any work-up, but placed him on Creon pancreatic enzymes, which he has been taking without relief.  He denies any similar issues in the past.  He denies any recent antibiotic use, travel, or contact with someone with similar complaints.  His last colonoscopy was with Dr. Earlean Shawl on 12/04/2015 --colonoscopy with excellent preparation to the cecum.  No colorectal neoplasia.  Extensive sigmoid diverticulosis.  Perianal skin rash.  Past Medical History:  Diagnosis Date  . Anemia   . Arthritis    osteoarthritis  back  . Celiac disease   . Chronic kidney disease   . Diverticulosis   . Enlarged prostate    followed by dr  Dorina Hoyer  . Essential hypertension, benign 03/29/2014  . Full dentures   . GERD (gastroesophageal reflux disease)   . Hemorrhoids   . Hiatal hernia   . IBS (irritable bowel syndrome)   . Internal hemorrhoids   . OSA on CPAP 10/17/2018  . Osteoporosis   . Postoperative anemia due to acute blood loss 03/29/2014  . Schatzki's ring   . Ulcer    Past Surgical History:  Procedure Laterality Date  . APPENDECTOMY  1963  . EYE SURGERY Bilateral    cataract removal - implants  . HERNIA REPAIR  2002   right inguinal   . HIP  SURGERY Left   . INGUINAL HERNIA REPAIR  08/19/2011   Procedure: HERNIA REPAIR  left INGUINAL ADULT;  Surgeon: Odis Hollingshead, MD;  Location: [facility];  Service: General;  Laterality: Left;  . INTRAMEDULLARY (IM) NAIL INTERTROCHANTERIC Left 03/27/2014   Procedure: INTRAMEDULLARY (IM) NAIL INTERTROCHANTRIC;  Surgeon: Mauri Pole, MD;  Location: [facility];  Service: Orthopedics;  Laterality: Left;  . RIGHT/LEFT HEART CATH AND CORONARY ANGIOGRAPHY N/A 03/12/2017   Procedure: Right/Left Heart Cath and Coronary Angiography;  Surgeon: Jolaine Artist, MD;  Location: [facility];  Service: Cardiovascular;  Laterality: N/A;    reports that he quit smoking about 37 years ago. He has never used smokeless tobacco. He reports that he does not drink alcohol or use drugs. family history includes Brain cancer in his brother; Breast cancer in his sister; Cervical cancer in his mother; Colon cancer in his mother; Heart attack in his father; Liver cancer in his brother; Stroke in his father; Throat cancer in his brother. No Known Allergies    Outpatient Encounter Medications as of 05/19/2019  Medication Sig  . acetaminophen (TYLENOL) 325 MG tablet Take 325 mg by mouth 2 (two) times daily.  Marland Kitchen albuterol (PROVENTIL HFA;VENTOLIN HFA) 108 (90 Base) MCG/ACT inhaler Inhale 2 puffs into the lungs every 6 (six) hours as needed for wheezing  or shortness of breath.  Marland Kitchen amLODipine (NORVASC) 10 MG tablet Take 10 mg by mouth daily.  Marland Kitchen azelastine (ASTELIN) 0.1 % nasal spray Place 2 sprays into both nostrils daily as needed. Use in each nostril as directed   . Cholecalciferol (DIALYVITE VITAMIN D 5000 PO) Take 2 capsules by mouth daily.  Marland Kitchen docusate sodium 100 MG CAPS Take 100 mg by mouth 2 (two) times daily. (Patient taking differently: Take 100 mg by mouth daily as needed (constipation). )  . doxazosin (CARDURA) 8 MG tablet Take 8 mg by mouth daily.  . furosemide (LASIX) 40 MG tablet Take 20 mg by mouth daily.   Marland Kitchen  HYDROcodone-acetaminophen (NORCO) 5-325 MG per tablet Take 1-2 tablets by mouth every 6 (six) hours as needed. (Patient taking differently: Take 1 tablet by mouth 3 (three) times daily. )  . lipase/protease/amylase (CREON) 36000 UNITS CPEP capsule Take 36,000 Units by mouth 3 (three) times daily.  Marland Kitchen loratadine (CLARITIN) 10 MG tablet Take 10 mg by mouth daily.  . montelukast (SINGULAIR) 10 MG tablet Take 10 mg by mouth daily.   Marland Kitchen omeprazole (PRILOSEC) 40 MG capsule Take 40 mg by mouth 2 (two) times daily.   Vladimir Faster Glycol-Propyl Glycol (SYSTANE OP) Apply to eye as needed.  Marland Kitchen rOPINIRole (REQUIP) 1 MG tablet Take 1 mg by mouth at bedtime.   . sertraline (ZOLOFT) 50 MG tablet 25 mg.   . tiZANidine (ZANAFLEX) 4 MG tablet Take 4 mg by mouth 2 (two) times daily. For pain; 1 -2 tablets depending on pain level  . zolpidem (AMBIEN) 10 MG tablet Take 10 mg by mouth at bedtime as needed for sleep.  . [DISCONTINUED] alendronate (FOSAMAX) 70 MG tablet Take 70 mg by mouth once a week. Take with a full glass of water on an empty stomach.  . [DISCONTINUED] aspirin EC 81 MG tablet Take 81 mg by mouth daily.  . [DISCONTINUED] cholecalciferol (VITAMIN D) 1000 UNITS tablet Take 1,000 Units by mouth daily.    . [DISCONTINUED] doxazosin (CARDURA) 2 MG tablet Take 2 mg by mouth daily.  . [DISCONTINUED] ferrous sulfate 325 (65 FE) MG tablet Take 1 tablet (325 mg total) by mouth 3 (three) times daily after meals. (Patient taking differently: Take 325 mg by mouth daily. )  . [DISCONTINUED] fluticasone (FLONASE) 50 MCG/ACT nasal spray Place 1 spray into both nostrils at bedtime.  . [DISCONTINUED] loratadine-pseudoephedrine (CLARITIN-D 24-HOUR) 10-240 MG 24 hr tablet Take 1 tablet by mouth at bedtime.  . [DISCONTINUED] meloxicam (MOBIC) 15 MG tablet Take 15 mg by mouth 2 (two) times daily.   . [DISCONTINUED] Multiple Vitamin (MULTIVITAMIN) tablet Take 1 tablet by mouth daily.   . [DISCONTINUED] pregabalin (LYRICA) 100 MG  capsule Take 100 mg by mouth daily.    No facility-administered encounter medications on file as of 05/19/2019.      REVIEW OF SYSTEMS  : All other systems reviewed and negative except where noted in the History of Present Illness.   PHYSICAL EXAM: BP (!) 90/48   Pulse 88   Temp 98.1 F (36.7 C)   Ht 5\' 7"  (1.702 m)   Wt 137 lb 12.8 oz (62.5 kg)   BMI 21.58 kg/m  General: Well developed white male in no acute distress Head: Normocephalic and atraumatic Eyes:  Sclerae anicteric, conjunctiva pink. Ears: Normal auditory acuity Lungs: Clear throughout to auscultation; no increased WOB. Heart: Regular rate and rhythm; no M/R/G. Abdomen: Soft, non-distended.  BS present, hyperactive.  Mild diffuse TTP. Rectal:  No external abnormalities noted.  DRE revealed no masses.  Light brown, heme negative stool on exam glove. Musculoskeletal: Symmetrical with no gross deformities  Skin: No lesions on visible extremities Extremities: No edema  Neurological: Alert oriented x 4, grossly non-focal Psychological:  Alert and cooperative. Normal mood and affect  ASSESSMENT AND PLAN: *Diarrhea: Quite significant with urgency and incontinence episodes for the past 2 months.  No evaluation has been done at this point.  Has had minimal to no improvement with Imodium or Pepto-Bismol.  Reports black stools, but are heme-negative today.  Will check CBC and BMP along with stool for C. difficile and a GI pathogen panel.  Advised on staying hydrated and a bland/low residue diet.  Will start Floristor BID.   CC:  Rigoberto Noel, MD  Addendum: Reviewed and agree with assessment and management plan. Pyrtle, Lajuan Lines, MD

## 2019-05-19 NOTE — Patient Instructions (Signed)
Go to the basement for labs today  Thank You for choosing [facility]

## 2019-05-20 ENCOUNTER — Other Ambulatory Visit: Payer: Medicare Other

## 2019-05-20 DIAGNOSIS — R197 Diarrhea, unspecified: Secondary | ICD-10-CM

## 2019-05-20 LAB — BASIC METABOLIC PANEL
BUN: 15 mg/dL (ref 6–23)
CO2: 26 mEq/L (ref 19–32)
Calcium: 9.1 mg/dL (ref 8.4–10.5)
Chloride: 104 mEq/L (ref 96–112)
Creatinine, Ser: 1.25 mg/dL (ref 0.40–1.50)
GFR: 55.29 mL/min — ABNORMAL LOW (ref >60.00)
Glucose, Bld: 79 mg/dL (ref 70–99)
Potassium: 3.6 mEq/L (ref 3.5–5.1)
Sodium: 140 mEq/L (ref 135–145)

## 2019-05-23 LAB — CLOSTRIDIUM DIFFICILE TOXIN B, QUALITATIVE, REAL-TIME PCR: Toxigenic C. Difficile by PCR: NOT DETECTED

## 2019-05-23 LAB — GASTROINTESTINAL PATHOGEN PANEL PCR
C. difficile Tox A/B, PCR: NOT DETECTED
Campylobacter, PCR: NOT DETECTED
Cryptosporidium, PCR: NOT DETECTED
E coli (ETEC) LT/ST PCR: NOT DETECTED
E coli (STEC) stx1/stx2, PCR: NOT DETECTED
E coli 0157, PCR: NOT DETECTED
Giardia lamblia, PCR: NOT DETECTED
Norovirus, PCR: NOT DETECTED
Rotavirus A, PCR: NOT DETECTED
Salmonella, PCR: NOT DETECTED
Shigella, PCR: NOT DETECTED

## 2019-05-26 ENCOUNTER — Ambulatory Visit: Payer: Medicare Other | Admitting: Physician Assistant

## 2019-05-30 ENCOUNTER — Ambulatory Visit: Payer: Medicare Other | Admitting: Internal Medicine

## 2019-06-02 DIAGNOSIS — Z23 Encounter for immunization: Secondary | ICD-10-CM | POA: Diagnosis not present

## 2019-06-07 ENCOUNTER — Ambulatory Visit (AMBULATORY_SURGERY_CENTER): Payer: Self-pay

## 2019-06-07 ENCOUNTER — Encounter: Payer: Self-pay | Admitting: Internal Medicine

## 2019-06-07 ENCOUNTER — Other Ambulatory Visit: Payer: Self-pay

## 2019-06-07 VITALS — Ht 67.0 in | Wt 138.0 lb

## 2019-06-07 DIAGNOSIS — R197 Diarrhea, unspecified: Secondary | ICD-10-CM

## 2019-06-07 MED ORDER — NA SULFATE-K SULFATE-MG SULF 17.5-3.13-1.6 GM/177ML PO SOLN: 1.0000 | Freq: Once | ORAL | 0 refills | Status: AC

## 2019-06-07 NOTE — Progress Notes (Signed)
Denies allergies to eggs or soy products. Denies complication of anesthesia or sedation. Denies use of weight loss medication. Denies use of O2.   Emmi instructions given for colonoscopy.  

## 2019-06-15 ENCOUNTER — Telehealth: Payer: Self-pay

## 2019-06-15 NOTE — Telephone Encounter (Signed)
Covid-19 screening questions   Do you now or have you had a fever in the last 14 days? No  Do you have any respiratory symptoms of shortness of breath or cough now or in the last 14 days? No  Do you have any family members or close contacts with diagnosed or suspected Covid-19 in the past 14 days? No  Have you been tested for Covid-19 and found to be positive? No        

## 2019-06-16 ENCOUNTER — Other Ambulatory Visit: Payer: Medicare Other

## 2019-06-16 ENCOUNTER — Other Ambulatory Visit: Payer: Self-pay

## 2019-06-16 ENCOUNTER — Other Ambulatory Visit: Payer: Self-pay | Admitting: General Surgery

## 2019-06-16 ENCOUNTER — Encounter: Payer: Self-pay | Admitting: Gastroenterology

## 2019-06-16 ENCOUNTER — Ambulatory Visit (AMBULATORY_SURGERY_CENTER): Payer: Medicare Other | Admitting: Gastroenterology

## 2019-06-16 ENCOUNTER — Encounter: Payer: Medicare Other | Admitting: Internal Medicine

## 2019-06-16 VITALS — BP 122/69 | HR 56 | Temp 98.2°F | Resp 20 | Ht 67.0 in | Wt 138.0 lb

## 2019-06-16 DIAGNOSIS — K52831 Collagenous colitis: Secondary | ICD-10-CM

## 2019-06-16 DIAGNOSIS — R197 Diarrhea, unspecified: Secondary | ICD-10-CM

## 2019-06-16 DIAGNOSIS — K648 Other hemorrhoids: Secondary | ICD-10-CM

## 2019-06-16 DIAGNOSIS — K5730 Diverticulosis of large intestine without perforation or abscess without bleeding: Secondary | ICD-10-CM | POA: Diagnosis not present

## 2019-06-16 DIAGNOSIS — R634 Abnormal weight loss: Secondary | ICD-10-CM

## 2019-06-16 MED ORDER — SODIUM CHLORIDE 0.9 % IV SOLN: 500.0000 mL | INTRAVENOUS | Status: DC

## 2019-06-16 NOTE — Patient Instructions (Signed)
YOU HAD AN ENDOSCOPIC PROCEDURE TODAY AT [facility]:   Refer to the procedure report that was given to you for any specific questions about what was found during the examination.  If the procedure report does not answer your questions, please call your gastroenterologist to clarify.  If you requested that your care partner not be given the details of your procedure findings, then the procedure report has been included in a sealed envelope for you to review at your convenience later.  YOU SHOULD EXPECT: Some feelings of bloating in the abdomen. Passage of more gas than usual.  Walking can help get rid of the air that was put into your GI tract during the procedure and reduce the bloating. If you had a lower endoscopy (such as a colonoscopy or flexible sigmoidoscopy) you may notice spotting of blood in your stool or on the toilet paper. If you underwent a bowel prep for your procedure, you may not have a normal bowel movement for a few days.  Please Note:  You might notice some irritation and congestion in your nose or some drainage.  This is from the oxygen used during your procedure.  There is no need for concern and it should clear up in a day or so.  SYMPTOMS TO REPORT IMMEDIATELY:   Following lower endoscopy (colonoscopy or flexible sigmoidoscopy):  Excessive amounts of blood in the stool  Significant tenderness or worsening of abdominal pains  Swelling of the abdomen that is new, acute  Fever of 100F or higher   For urgent or emergent issues, a gastroenterologist can be reached at any hour by calling [phone].   DIET:  We do recommend a small meal at first, but then you may proceed to your regular diet.  Drink plenty of fluids but you should avoid alcoholic beverages for 24 hours.  MEDICATIONS: Continue present medications. Continue Florastor twice daily.  Labs: Get blood drawn for tTG prior to leaving today.  Follow up with Dr. Hilarie Fredrickson in his office in about 1  month.  Please see handouts given to you by your recovery nurse.  ACTIVITY:  You should plan to take it easy for the rest of today and you should NOT DRIVE or use heavy machinery until tomorrow (because of the sedation medicines used during the test).    FOLLOW UP: Our staff will call the number listed on your records 48-72 hours following your procedure to check on you and address any questions or concerns that you may have regarding the information given to you following your procedure. If we do not reach you, we will leave a message.  We will attempt to reach you two times.  During this call, we will ask if you have developed any symptoms of COVID 19. If you develop any symptoms (ie: fever, flu-like symptoms, shortness of breath, cough etc.) before then, please call [phone].  If you test positive for Covid 19 in the 2 weeks post procedure, please call and report this information to [place].    If any biopsies were taken you will be contacted by phone or by letter within the next 1-3 weeks.  Please call us at [phone] if you have not heard about the biopsies in 3 weeks.   Thank you for allowing [place] to provide for your healthcare needs today.   SIGNATURES/CONFIDENTIALITY: You and/or your care partner have signed paperwork which will be entered into your electronic medical record.  These signatures attest to the fact that that  the information above on your After Visit Summary has been reviewed and is understood.  Full responsibility of the confidentiality of this discharge information lies with you and/or your care-partner.

## 2019-06-16 NOTE — Progress Notes (Signed)
Called to room to assist during endoscopic procedure.  Patient ID and intended procedure confirmed with present staff. Received instructions for my participation in the procedure from the performing physician.  

## 2019-06-16 NOTE — Op Note (Signed)
[facility] Patient Name: [name] Procedure Date: 06/16/2019 3:43 PM MRN: [id number] Endoscopist: Ladene Artist , MD Age: 84 Referring MD:  Date of Birth: 11-13-1934 Gender: Male Account #: [id number] Procedure:                Colonoscopy Indications:              Clinically significant diarrhea of unexplained                            origin, Weight loss Medicines:                Monitored Anesthesia Care Procedure:                Pre-Anesthesia Assessment:                           - Prior to the procedure, a History and Physical                            was performed, and patient medications and                            allergies were reviewed. The patient's tolerance of                            previous anesthesia was also reviewed. The risks                            and benefits of the procedure and the sedation                            options and risks were discussed with the patient.                            All questions were answered, and informed consent                            was obtained. Prior Anticoagulants: The patient has                            taken no previous anticoagulant or antiplatelet                            agents. ASA Grade Assessment: II - A patient with                            mild systemic disease. After reviewing the risks                            and benefits, the patient was deemed in                            satisfactory condition to undergo the procedure.  After obtaining informed consent, the colonoscope                            was passed under direct vision. Throughout the                            procedure, the patient's blood pressure, pulse, and                            oxygen saturations were monitored continuously. The                            Colonoscope was introduced through the anus and                            advanced to the the terminal ileum, with                        identification of the appendiceal orifice and IC                            valve. The terminal ileum, ileocecal valve,                            appendiceal orifice, and rectum were photographed.                            The appendiceal orifice photograph did not capture.                            The quality of the bowel preparation was good after                            moderate lavage and suctioning. The colonoscopy was                            performed without difficulty. The patient tolerated                            the procedure well. Scope In: 3:48:28 PM Scope Out: 4:03:35 PM Scope Withdrawal Time: 0 hours 12 minutes 40 seconds  Total Procedure Duration: 0 hours 15 minutes 7 seconds  Findings:                 The perianal and digital rectal examinations were                            normal.                           The terminal ileum appeared normal.                           Multiple small-mouthed diverticula were found in  the left colon. There was narrowing of the colon in                            association with the diverticular opening. There                            was no evidence of diverticular bleeding.                           External hemorrhoids were found during                            retroflexion. The hemorrhoids were small.                           The exam was otherwise without abnormality on                            direct and retroflexion views. Random biopsies                            obtained throughout the colon. Complications:            No immediate complications. Estimated blood loss:                            None. Estimated Blood Loss:     Estimated blood loss: none. Impression:               - The examined portion of the ileum was normal.                           - Moderate diverticulosis in the left colon.                           - External hemorrhoids.                            - The examination was otherwise normal on direct                            and retroflexion views. Biopsies obtained. Recommendation:           - Patient has a contact number available for                            emergencies. The signs and symptoms of potential                            delayed complications were discussed with the                            patient. Return to normal activities tomorrow.                            Written discharge instructions were provided to the  patient.                           - Resume previous diet.                           - Continue present medications.                           - Await pathology results.                           - Check tTG.                           - Continue Florastor bid.                           - Return to GI office in about 1 month with Dr.                            Hilarie Fredrickson or APP. Ladene Artist, MD 06/16/2019 4:11:28 PM This report has been signed electronically.

## 2019-06-16 NOTE — Progress Notes (Signed)
Pt's states no medical or surgical changes since previsit or office visit.  CW vitals, JG IV and JB temps. SM

## 2019-06-16 NOTE — Progress Notes (Signed)
Pt Drowsy. VSS. To PACU, report to RN. No anesthetic complications noted.  

## 2019-06-17 LAB — TISSUE TRANSGLUTAMINASE, IGA: (tTG) Ab, IgA: 2 U/mL

## 2019-06-21 ENCOUNTER — Telehealth: Payer: Self-pay

## 2019-06-21 NOTE — Telephone Encounter (Signed)
  Follow up Call-  Call back number 06/16/2019  Post procedure Call Back phone  # [phone]  Permission to leave phone message Yes  Some recent data might be hidden     Patient questions:  Do you have a fever, pain , or abdominal swelling? No. Pain Score  0 *  Have you tolerated food without any problems? Yes.    Have you been able to return to your normal activities? Yes.    Do you have any questions about your discharge instructions: Diet   No. Medications  No. Follow up visit  No.  Do you have questions or concerns about your Care? No.  Actions: * If pain score is 4 or above: No action needed, pain <4.  1. Have you developed a fever since your procedure? no  2.   Have you had an respiratory symptoms (SOB or cough) since your procedure? no  3.   Have you tested positive for COVID 19 since your procedure no  4.   Have you had any family members/close contacts diagnosed with the COVID 19 since your procedure?  no   If yes to any of these questions please route to Joylene John, RN and Alphonsa Gin, Therapist, sports.

## 2019-06-27 DIAGNOSIS — D044 Carcinoma in situ of skin of scalp and neck: Secondary | ICD-10-CM | POA: Diagnosis not present

## 2019-06-27 DIAGNOSIS — L570 Actinic keratosis: Secondary | ICD-10-CM | POA: Diagnosis not present

## 2019-06-27 DIAGNOSIS — D485 Neoplasm of uncertain behavior of skin: Secondary | ICD-10-CM | POA: Diagnosis not present

## 2019-06-27 DIAGNOSIS — Z85828 Personal history of other malignant neoplasm of skin: Secondary | ICD-10-CM | POA: Diagnosis not present

## 2019-06-29 ENCOUNTER — Other Ambulatory Visit: Payer: Self-pay

## 2019-06-29 ENCOUNTER — Telehealth: Payer: Self-pay | Admitting: Internal Medicine

## 2019-06-29 MED ORDER — BUDESONIDE 3 MG PO CPEP: 9.0000 mg | ORAL_CAPSULE | Freq: Every day | ORAL | 1 refills | Status: DC

## 2019-06-29 NOTE — Telephone Encounter (Signed)
[name] has spoken with pt regarding results, see result note.

## 2019-08-01 ENCOUNTER — Encounter: Payer: Self-pay | Admitting: *Deleted

## 2019-08-11 ENCOUNTER — Encounter: Payer: Self-pay | Admitting: Internal Medicine

## 2019-08-11 ENCOUNTER — Ambulatory Visit (INDEPENDENT_AMBULATORY_CARE_PROVIDER_SITE_OTHER): Payer: Medicare Other | Admitting: Internal Medicine

## 2019-08-11 ENCOUNTER — Other Ambulatory Visit: Payer: Self-pay

## 2019-08-11 VITALS — BP 130/56 | HR 76 | Temp 98.1°F | Ht 67.0 in | Wt 139.2 lb

## 2019-08-11 DIAGNOSIS — K900 Celiac disease: Secondary | ICD-10-CM | POA: Diagnosis not present

## 2019-08-11 DIAGNOSIS — K52831 Collagenous colitis: Secondary | ICD-10-CM | POA: Diagnosis not present

## 2019-08-11 DIAGNOSIS — K219 Gastro-esophageal reflux disease without esophagitis: Secondary | ICD-10-CM

## 2019-08-11 MED ORDER — BUDESONIDE 3 MG PO CPEP: ORAL_CAPSULE | ORAL | 0 refills | Status: DC

## 2019-08-11 MED ORDER — DICYCLOMINE HCL 10 MG PO CAPS: 10.0000 mg | ORAL_CAPSULE | Freq: Three times a day (TID) | ORAL | 1 refills | Status: DC | PRN

## 2019-08-11 MED ORDER — FAMOTIDINE 20 MG PO TABS: 20.0000 mg | ORAL_TABLET | Freq: Two times a day (BID) | ORAL | 4 refills | Status: DC

## 2019-08-11 NOTE — Progress Notes (Signed)
Subjective:    Patient ID: [name], male    DOB: Feb 27, 1935, 84 y.o.   MRN: [id number]  HPI [name] is an 84 year old male with a history of celiac disease, GERD with history of Schatzki's ring, history of colonic diverticulosis, history of internal hemorrhoids status post banding, recently evaluated for diarrhea by colonoscopy found to have collagenous colitis who is here for follow-up.  He is here today with his daughter.  Colonoscopy was performed by Dr. Fuller Plan in my absence on 06/16/2019.  This revealed a normal terminal ileum.  Multiple diverticuli are in the left colon with associated narrowing.  External hemorrhoids.  Otherwise normal exam.  Random biopsies showed collagenous colitis.  He was started on budesonide 9 mg daily and also recently started Florastor 250 mg twice daily.  He reports that his diarrhea is significantly better with the budesonide therapy.  He was having 5-6 or more bowel movements per day including nocturnal stools.  Now he is having 1-2 stools per day but they are not yet formed.  They can be urgent and associated with a cramping mid and lower abdominal pain.  He does report borborygmi.  No upper GI or hepatobiliary complaint today.  He continues to follow a strict gluten-free diet.  He had a TTG level done around the time of his colonoscopy which was normal.   Review of Systems As per HPI, otherwise negative  Current Medications, Allergies, Past Medical History, Past Surgical History, Family History and Social History were reviewed in [employer] record.     Objective:   Physical Exam BP (!) 130/56   Pulse 76   Temp 98.1 F (36.7 C)   Ht 5' 7"  (1.702 m)   Wt 139 lb 3.2 oz (63.1 kg)   BMI 21.80 kg/m  Gen: awake, alert, NAD HEENT: anicteric, op clear CV: RRR, no mrg Pulm: CTA b/l Abd: soft, NT/ND, +BS throughout Ext: no c/c/e Neuro: nonfocal  CBC    Component Value Date/Time   WBC 4.9 05/19/2019 1649   RBC 3.69  (L) 05/19/2019 1649   HGB 11.0 (L) 05/19/2019 1649   HCT 33.2 (L) 05/19/2019 1649   PLT 207.0 05/19/2019 1649   MCV 89.9 05/19/2019 1649   MCH 28.6 05/11/2017 1146   MCHC 33.1 05/19/2019 1649   RDW 13.8 05/19/2019 1649   LYMPHSABS 1.2 05/19/2019 1649   MONOABS 0.6 05/19/2019 1649   EOSABS 0.3 05/19/2019 1649   BASOSABS 0.1 05/19/2019 1649   CMP     Component Value Date/Time   NA 140 05/19/2019 1649   K 3.6 05/19/2019 1649   CL 104 05/19/2019 1649   CO2 26 05/19/2019 1649   GLUCOSE 79 05/19/2019 1649   BUN 15 05/19/2019 1649   CREATININE 1.25 05/19/2019 1649   CALCIUM 9.1 05/19/2019 1649   PROT 6.3 08/14/2011 1027   ALBUMIN 3.8 08/14/2011 1027   AST 18 08/14/2011 1027   ALT 12 08/14/2011 1027   ALKPHOS 60 08/14/2011 1027   BILITOT 0.5 08/14/2011 1027   GFRNONAA 48 (L) 05/11/2017 1146   GFRAA 55 (L) 05/11/2017 1146          Assessment & Plan:  84 year old male with a history of celiac disease, GERD with history of Schatzki's ring, history of colonic diverticulosis, history of internal hemorrhoids status post banding, recently evaluated for diarrhea by colonoscopy found to have collagenous colitis who is here for follow-up.  1.  Collagenous colitis --we discussed microscopic colitis at length today.  He is responding favorably to budesonide therapy though I do not think he is yet in full remission.  We discussed stepping the medication dose down slowly but that if symptoms return or worsen we would need to step up therapy again.  We discussed the association with medications with microscopic colitis but also with celiac disease.  He does follow a strict gluten-free diet.  The 2 medications of possible involvement with microscopic colitis are omeprazole and sertraline.  I have recommended we do the following: --Continue budesonide 9 mg daily to complete a total weeks, then decrease to 6 mg x 8 weeks then 3 mg x 8 weeks.  Again if anytime during taper symptoms worsen he should  notify me immediately --Prescribe Bentyl 10 mg, 1 to 2 tablets 3 times daily as needed for crampy abdominal pain --Change omeprazole to Pepcid 20 mg twice daily given association with PPI microscopic colitis --Discussed discontinuation of sertraline with Dr. Elsworth Soho, he is on a very low dose and thus would likely not need to be tapered.  Both he and his daughter do not feel like stopping this medication would be a problem. --Can continue Florastor per box instruction  2.  GERD --no GERD symptoms, changing to H2 blocker as above; notify me with this change if return of GERD or dyspeptic symptoms  3.  Celiac disease --continue to follow strict gluten-free diet.  Normal TTG indicates good control of his disease  25 minutes spent with the patient today. Greater than 50% was spent in counseling and coordination of care with the patient

## 2019-08-11 NOTE — Patient Instructions (Addendum)
Continue Florastor if you prefer.  Talk to your PCP about about discontinuing Zoloft given association with microscopic colitis.  We have sent the following medications to your pharmacy for you to pick up at your convenience: Bentyl 10-20 mg three times daily as needed for crampy abdominal pain Pepcid 20 mg twice daily Budesonide 9 mg (total of 8 weeks), 6 mg x 8 weeks, 3 mg x 8 weeks.  Discontinue omeprazole.  If your symptoms worsen after reduction of budesonide, please call us.  Please follow up with Dr Hilarie Fredrickson in 3 months.  If you are age 82 or older, your body mass index should be between 23-30. Your Body mass index is 21.8 kg/m. If this is out of the aforementioned range listed, please consider follow up with your Primary Care Provider.  If you are age 65 or younger, your body mass index should be between 19-25. Your Body mass index is 21.8 kg/m. If this is out of the aformentioned range listed, please consider follow up with your Primary Care Provider.

## 2019-08-23 ENCOUNTER — Telehealth: Payer: Self-pay | Admitting: Internal Medicine

## 2019-08-23 NOTE — Telephone Encounter (Signed)
Pt's wife [name] states that copay for budisonide is $200. They would like something more affordable.

## 2019-08-23 NOTE — Telephone Encounter (Signed)
Patient's wife is advised that unfortunately, for microscopic colitis, budesonide is the best medication option. I offered to give a goodrx card but she indicates they will purchase budesonide as it has really been helping him. She states that they just wanted to make sure there were no cheaper options.

## 2019-09-15 ENCOUNTER — Ambulatory Visit (INDEPENDENT_AMBULATORY_CARE_PROVIDER_SITE_OTHER): Payer: Medicare Other | Admitting: Internal Medicine

## 2019-09-15 ENCOUNTER — Encounter: Payer: Self-pay | Admitting: Internal Medicine

## 2019-09-15 VITALS — BP 110/52 | HR 72 | Temp 97.7°F | Ht 64.5 in | Wt 145.1 lb

## 2019-09-15 DIAGNOSIS — K219 Gastro-esophageal reflux disease without esophagitis: Secondary | ICD-10-CM | POA: Diagnosis not present

## 2019-09-15 DIAGNOSIS — K52831 Collagenous colitis: Secondary | ICD-10-CM | POA: Diagnosis not present

## 2019-09-15 DIAGNOSIS — K900 Celiac disease: Secondary | ICD-10-CM | POA: Diagnosis not present

## 2019-09-15 NOTE — Progress Notes (Signed)
   Subjective:    Patient ID: [name], male    DOB: [birth year], 84 y.o.   MRN: [id number]  HPI [name] is an 84 year old male with a history of celiac disease, GERD with history of Schatzki's ring, history of colonic diverticulosis, fairly recent diagnosis of collagenous colitis who is here for follow-up.  He is here today with his daughter and I saw him last on 08/11/2019.  He has successfully weaned budesonide down to 6 mg a day and had plans for 1 additional week at 6 mg and then tapering to 3 mg.  His diarrhea has completely resolved.  He is now having regular bowel movements which are formed.  No further crampy abdominal pain.  He has been taking 10 mg of dicyclomine twice a day.  For this he is feeling great.  His reflux however, has become significant for him since changing off of omeprazole.  He has been using Pepcid 20 mg twice a day.  With this he is having pyrosis, heartburn.  Using Tums as relief for breakthrough.  No dysphagia.  No abdominal pain.  He is also off of sertraline.   Review of Systems As per HPI, otherwise negative  Current Medications, Allergies, Past Medical History, Past Surgical History, Family History and Social History were reviewed in [employer] record.     Objective:   Physical Exam BP (!) 110/52 (BP Location: Left Arm, Patient Position: Sitting, Cuff Size: Normal)   Pulse 72   Temp 97.7 F (36.5 C)   Ht 5' 4.5" (1.638 m) Comment: height measured without shoes  Wt 145 lb 2 oz (65.8 kg)   BMI 24.53 kg/m  Gen: awake, alert, NAD Neuro: nonfocal     Assessment & Plan:  84 year old male with a history of celiac disease, GERD with history of Schatzki's ring, history of colonic diverticulosis, fairly recent diagnosis of collagenous colitis who is here for follow-up.  1.  Collagenous colitis --under great control with 6 mg budesonide daily.  He is also stopped the potential offending PPI and SSRI medications.  I am  going to go ahead and taper him to 3 mg daily to complete the supply he has left.  I reminded him that if symptoms flare during taper or after stopping that he should let me know immediately --Reduce budesonide to 3 mg once daily; plan to complete current supply and then stop therapy --Notify me at any time he has recurrent diarrhea or crampy abdominal pain --He has been on scheduled dicyclomine and I told him he could back off and use this 10 mg 2-3 times a day only as needed for abdominal discomfort cramping  2.  GERD --previously no GERD symptoms on omeprazole.  I had recommended we stop this which she did given association of PPI and microscopic colitis.  Unfortunately he has been having heartburn despite 20 mg of famotidine twice daily. --Increase famotidine to 40 mg twice a day.  He should try this for 2 to 3 weeks and then let me know if symptoms remain uncontrolled.  If so we would likely try to reinstitute PPI therapy but would need to monitor closely his microscopic colitis --He can use Tums or Gaviscon for breakthrough heartburn as needed  3.  Celiac disease --continue gluten-free diet

## 2019-09-15 NOTE — Patient Instructions (Addendum)
Increase your Pepcid back to 40 mg twice daily.  Call and let us know if the newer pepcid dose is effective for your reflux. Our phone number is [phone].  Change your dicyclomine dosage to as needed usage only.  Decrease your budesonide to 3 mg (1 tablet) daily until completely gone.  If you are age 82 or older, your body mass index should be between 23-30. Your Body mass index is 24.53 kg/m. If this is out of the aforementioned range listed, please consider follow up with your Primary Care Provider.  If you are age 31 or younger, your body mass index should be between 19-25. Your Body mass index is 24.53 kg/m. If this is out of the aformentioned range listed, please consider follow up with your Primary Care Provider.

## 2019-09-26 ENCOUNTER — Telehealth: Payer: Self-pay | Admitting: Internal Medicine

## 2019-09-26 MED ORDER — FAMOTIDINE 40 MG PO TABS: 40.0000 mg | ORAL_TABLET | Freq: Two times a day (BID) | ORAL | 3 refills | Status: DC

## 2019-09-26 NOTE — Telephone Encounter (Signed)
Rx sent 

## 2019-10-10 ENCOUNTER — Telehealth: Payer: Self-pay | Admitting: Internal Medicine

## 2019-10-10 NOTE — Telephone Encounter (Signed)
Patient advised that he should only be on 1 tablet twice daily. He was originally on famotidine 20 mg twice daily but we increased him to famotidine 40 mg twice daily and sent a new script to reflect that. The new script is for 40 mg tablet so he will still take 1 tablet two times daily. Patient verbalizes understanding of this.

## 2019-10-10 NOTE — Telephone Encounter (Signed)
Pt called stating that rf for famotidine needs to be for 2 pills BID. Pt states that dose was increased from 1 BID to 2 BID. Pharmacy needs updated prescription.

## 2019-10-19 DIAGNOSIS — N401 Enlarged prostate with lower urinary tract symptoms: Secondary | ICD-10-CM | POA: Diagnosis not present

## 2019-10-19 DIAGNOSIS — R351 Nocturia: Secondary | ICD-10-CM | POA: Diagnosis not present

## 2019-10-20 DIAGNOSIS — I129 Hypertensive chronic kidney disease with stage 1 through stage 4 chronic kidney disease, or unspecified chronic kidney disease: Secondary | ICD-10-CM | POA: Diagnosis not present

## 2019-10-20 DIAGNOSIS — K900 Celiac disease: Secondary | ICD-10-CM | POA: Diagnosis not present

## 2019-10-20 DIAGNOSIS — G4733 Obstructive sleep apnea (adult) (pediatric): Secondary | ICD-10-CM | POA: Diagnosis not present

## 2019-10-20 DIAGNOSIS — R197 Diarrhea, unspecified: Secondary | ICD-10-CM | POA: Diagnosis not present

## 2019-10-20 DIAGNOSIS — G8929 Other chronic pain: Secondary | ICD-10-CM | POA: Diagnosis not present

## 2019-10-20 DIAGNOSIS — M810 Age-related osteoporosis without current pathological fracture: Secondary | ICD-10-CM | POA: Diagnosis not present

## 2019-10-20 DIAGNOSIS — K219 Gastro-esophageal reflux disease without esophagitis: Secondary | ICD-10-CM | POA: Diagnosis not present

## 2019-10-20 DIAGNOSIS — N1831 Chronic kidney disease, stage 3a: Secondary | ICD-10-CM | POA: Diagnosis not present

## 2019-10-20 DIAGNOSIS — F439 Reaction to severe stress, unspecified: Secondary | ICD-10-CM | POA: Diagnosis not present

## 2019-10-20 DIAGNOSIS — G4700 Insomnia, unspecified: Secondary | ICD-10-CM | POA: Diagnosis not present

## 2019-10-20 DIAGNOSIS — Z0000 Encounter for general adult medical examination without abnormal findings: Secondary | ICD-10-CM | POA: Diagnosis not present

## 2019-10-20 DIAGNOSIS — E785 Hyperlipidemia, unspecified: Secondary | ICD-10-CM | POA: Diagnosis not present

## 2019-11-02 ENCOUNTER — Ambulatory Visit: Payer: Medicare Other | Attending: Internal Medicine

## 2019-11-02 DIAGNOSIS — Z23 Encounter for immunization: Secondary | ICD-10-CM | POA: Insufficient documentation

## 2019-11-02 NOTE — Progress Notes (Signed)
   Covid-19 Vaccination Clinic  Name:  [name]    MRN: [id number] DOB: 02-21-1935  11/02/2019  [name] was observed post Covid-19 immunization for 15 minutes without incidence. He was provided with Vaccine Information Sheet and instruction to access the V-Safe system.   [name] was instructed to call 911 with any severe reactions post vaccine: Marland Kitchen Difficulty breathing  . Swelling of your face and throat  . A fast heartbeat  . A bad rash all over your body  . Dizziness and weakness    Immunizations Administered    Name Date Dose VIS Date Route   Pfizer COVID-19 Vaccine 11/02/2019  9:38 AM 0.3 mL 09/23/2019 Intramuscular   Manufacturer: [employer]   Lot: [id number]   [place]: [address]-8

## 2019-11-15 ENCOUNTER — Telehealth: Payer: Self-pay | Admitting: Internal Medicine

## 2019-11-16 MED ORDER — FAMOTIDINE 40 MG PO TABS: 40.0000 mg | ORAL_TABLET | Freq: Two times a day (BID) | ORAL | 2 refills | Status: DC

## 2019-11-16 NOTE — Telephone Encounter (Signed)
Needs famotidine Rx sent to [facility]. I have sent refills to [facility] for patient.

## 2019-11-22 ENCOUNTER — Other Ambulatory Visit: Payer: Self-pay

## 2019-11-22 ENCOUNTER — Ambulatory Visit (INDEPENDENT_AMBULATORY_CARE_PROVIDER_SITE_OTHER): Payer: Medicare Other | Admitting: Internal Medicine

## 2019-11-22 ENCOUNTER — Encounter: Payer: Self-pay | Admitting: Internal Medicine

## 2019-11-22 VITALS — BP 124/60 | HR 70 | Temp 97.7°F | Ht 67.0 in | Wt 142.0 lb

## 2019-11-22 DIAGNOSIS — K219 Gastro-esophageal reflux disease without esophagitis: Secondary | ICD-10-CM

## 2019-11-22 DIAGNOSIS — K52831 Collagenous colitis: Secondary | ICD-10-CM | POA: Diagnosis not present

## 2019-11-22 DIAGNOSIS — K900 Celiac disease: Secondary | ICD-10-CM

## 2019-11-22 NOTE — Patient Instructions (Signed)
If you are age 83 or older, your body mass index should be between 23-30. Your Body mass index is 22.24 kg/m. If this is out of the aforementioned range listed, please consider follow up with your Primary Care Provider.  If you are age 42 or younger, your body mass index should be between 19-25. Your Body mass index is 22.24 kg/m. If this is out of the aformentioned range listed, please consider follow up with your Primary Care Provider.   We have sent the following medications to your pharmacy for you to pick up at your convenience: Continue Famotidine 40 mg twice daily Continue Bentyl as needed STOP Budesonide (If diarrhea returns please call and let us know)  Please purchase the following medications over the counter and take as directed: Gaviscon

## 2019-11-22 NOTE — Progress Notes (Signed)
   Subjective:    Patient ID: [name], male    DOB: 1935-07-07, 85 y.o.   MRN: [id number]  HPI [name] is an 85 year old male with a history of celiac disease, GERD with history of Schatzki's ring, history of colonic diverticulosis, collagenous colitis who is here for follow-up.  He is here today with his wife and was last seen on 09/15/2019.  He reports that he has been doing well.  He had a flare of his reflux after changing PPI to H2 blocker.  We increase famotidine to 40 mg which he is using roughly every 12 hours.  This has significantly improved his reflux.  He has having to use Gaviscon though not on a daily basis for breakthrough heartburn and reflux.  He is happy with the current control of his heartburn symptoms.  No dysphagia or odynophagia.  Diarrhea has completely resolved though he is still on budesonide 3 mg a day.  He rarely uses dicyclomine if he has crampy abdominal pain but estimates that this is maybe twice per week.   Review of Systems As per HPI, otherwise negative  Current Medications, Allergies, Past Medical History, Past Surgical History, Family History and Social History were reviewed in [employer] record.     Objective:   Physical Exam BP 124/60   Pulse 70   Temp 97.7 F (36.5 C)   Ht 5' 7"  (1.702 m)   Wt 142 lb (64.4 kg)   BMI 22.24 kg/m  Gen: awake, alert, NAD HEENT: anicteric Abd: soft, NT/ND, +BS throughout Ext: no c/c/e Neuro: nonfocal     Assessment & Plan:  85 year old male with a history of celiac disease, GERD with history of Schatzki's ring, history of colonic diverticulosis, collagenous colitis who is here for follow-up.   1.  GERD --under better control with increased H2 blocker dose. --Continue famotidine 40 mg every 12 hours --Okay for Gaviscon per bottle instruction as needed breakthrough heartburn --Avoiding PPI due to history of microscopic colitis  2.  Collagenous colitis --in remission on  budesonide 3 mg daily.  We discussed this at length today and going to stop budesonide altogether.  He is reminded to let me know if diarrhea returns after stopping this medication.  He is also off of sertraline and we discussed previously how SSRIs associated with microscopic colitis as is PPI, see #1 --Discontinue budesonide altogether and let me know if diarrhea returns --Okay to use Bentyl 10 mg every 4-6 hours as needed for crampy abdominal pain  3.  Celiac disease --continue gluten-free diet  6-monthfollow-up, sooner if needed  30 minutes total spent today including patient facing time, coordination of care, reviewing medical history/procedures/pertinent radiology studies, and documentation of the encounter.

## 2019-11-23 ENCOUNTER — Ambulatory Visit: Payer: Medicare Other | Attending: Internal Medicine

## 2019-11-23 DIAGNOSIS — Z23 Encounter for immunization: Secondary | ICD-10-CM | POA: Insufficient documentation

## 2019-11-23 NOTE — Progress Notes (Signed)
   Covid-19 Vaccination Clinic  Name:  [name]    MRN: [id number] DOB: [birth year]  11/23/2019  [name] was observed post Covid-19 immunization for 15 minutes without incidence. He was provided with Vaccine Information Sheet and instruction to access the V-Safe system.   [name] was instructed to call 911 with any severe reactions post vaccine: Marland Kitchen Difficulty breathing  . Swelling of your face and throat  . A fast heartbeat  . A bad rash all over your body  . Dizziness and weakness    Immunizations Administered    Name Date Dose VIS Date Route   Pfizer COVID-19 Vaccine 11/23/2019 12:35 PM 0.3 mL 09/23/2019 Intramuscular   Manufacturer: [employer]   Lot: [id number]   [place]: [id number]

## 2019-12-09 ENCOUNTER — Telehealth: Payer: Self-pay | Admitting: *Deleted

## 2019-12-09 ENCOUNTER — Telehealth (INDEPENDENT_AMBULATORY_CARE_PROVIDER_SITE_OTHER): Payer: Medicare Other | Admitting: Cardiology

## 2019-12-09 ENCOUNTER — Other Ambulatory Visit: Payer: Self-pay

## 2019-12-09 ENCOUNTER — Encounter: Payer: Self-pay | Admitting: Cardiology

## 2019-12-09 VITALS — BP 131/58 | HR 86 | Temp 96.6°F | Ht 67.0 in | Wt 140.0 lb

## 2019-12-09 DIAGNOSIS — I10 Essential (primary) hypertension: Secondary | ICD-10-CM | POA: Diagnosis not present

## 2019-12-09 DIAGNOSIS — G4733 Obstructive sleep apnea (adult) (pediatric): Secondary | ICD-10-CM

## 2019-12-09 DIAGNOSIS — Z9989 Dependence on other enabling machines and devices: Secondary | ICD-10-CM

## 2019-12-09 DIAGNOSIS — Z87891 Personal history of nicotine dependence: Secondary | ICD-10-CM | POA: Diagnosis not present

## 2019-12-09 NOTE — Patient Instructions (Signed)
Medication Instructions:  Your physician recommends that you continue on your current medications as directed. Please refer to the Current Medication list given to you today.  *If you need a refill on your cardiac medications before your next appointment, please call your pharmacy*   Follow-Up: At [facility], you and your health needs are our priority.  As part of our continuing mission to provide you with exceptional heart care, we have created designated Provider Care Teams.  These Care Teams include your primary Cardiologist (physician) and Advanced Practice Providers (APPs -  Physician Assistants and Nurse Practitioners) who all work together to provide you with the care you need, when you need it.  We recommend signing up for the patient portal called "MyChart".  Sign up information is provided on this After Visit Summary.  MyChart is used to connect with patients for Virtual Visits (Telemedicine).  Patients are able to view lab/test results, encounter notes, upcoming appointments, etc.  Non-urgent messages can be sent to your provider as well.   To learn more about what you can do with MyChart, go to [email or web address].    Your next appointment:   1 year(s)  The format for your next appointment:   Either In Person or Virtual  Provider:   Fransico Him, MD

## 2019-12-09 NOTE — Progress Notes (Signed)
Virtual Visit via Telephone Note   This visit type was conducted due to national recommendations for restrictions regarding the COVID-19 Pandemic (e.g. social distancing) in an effort to limit this patient's exposure and mitigate transmission in our community.  Due to his co-morbid illnesses, this patient is at least at moderate risk for complications without adequate follow up.  This format is felt to be most appropriate for this patient at this time.  The patient did not have access to video technology/had technical difficulties with video requiring transitioning to audio format only (telephone).  All issues noted in this document were discussed and addressed.  No physical exam could be performed with this format.  Please refer to the patient's chart for his  consent to telehealth for [facility].   Evaluation Performed:  Follow-up visit  This visit type was conducted due to national recommendations for restrictions regarding the COVID-19 Pandemic (e.g. social distancing).  This format is felt to be most appropriate for this patient at this time.  All issues noted in this document were discussed and addressed.  No physical exam was performed (except for noted visual exam findings with Video Visits).  Please refer to the patient's chart (MyChart message for video visits and phone note for telephone visits) for the patient's consent to telehealth for [facility]  Date:  12/09/2019   ID:  [name], DOB January 13, 1935, MRN [id number]  Patient Location:  Home  Provider location:   [place]  PCP:  Rigoberto Noel, MD  Sleep Medicine:  Fransico Him, MD Electrophysiologist:  None   Chief Complaint:  OSA, HTN  History of Present Illness:    [name] is a 85 y.o. male who presents via audio/video conferencing for a telehealth visit today.    [name] is a 85 y.o. male with a hx of mild OSA with an AHI of 12/hr and oxygen desaturations as low as 79%. He is on CPAP at   9cm H2O. He is doing well with his CPAP device and thinks that he has gotten used to it.  He tolerates the full face mask and feels the pressure is adequate.  Since going on CPAP He feels rested in the am and has no significant daytime sleepiness.  He says that he gets significant mouth dryness at night but no nasal dryness or nasal congestion.  He does not think that he snores.    The patient does not have symptoms concerning for COVID-19 infection (fever, chills, cough, or new shortness of breath).   Prior CV studies:   The following studies were reviewed today:  PAP compliance download  Past Medical History:  Diagnosis Date  . Anemia   . Arthritis    osteoarthritis  back  . Celiac disease   . Chronic kidney disease   . Collagenous colitis   . Diverticulosis   . Enlarged prostate    followed by dr  Dorina Hoyer  . Essential hypertension, benign 03/29/2014  . External hemorrhoids   . Full dentures   . GERD (gastroesophageal reflux disease)   . Hemorrhoids   . Hiatal hernia   . IBS (irritable bowel syndrome)   . Internal hemorrhoids   . OSA on CPAP 10/17/2018  . Osteoporosis   . Postoperative anemia due to acute blood loss 03/29/2014  . Schatzki's ring   . Sleep apnea    CPAP Machine  . Ulcer    Past Surgical History:  Procedure Laterality Date  . APPENDECTOMY  1963  .  EYE SURGERY Bilateral    cataract removal - implants  . HERNIA REPAIR  2002   right inguinal   . HIP SURGERY Left   . INGUINAL HERNIA REPAIR  08/19/2011   Procedure: HERNIA REPAIR  left INGUINAL ADULT;  Surgeon: Odis Hollingshead, MD;  Location: [facility];  Service: General;  Laterality: Left;  . INTRAMEDULLARY (IM) NAIL INTERTROCHANTERIC Left 03/27/2014   Procedure: INTRAMEDULLARY (IM) NAIL INTERTROCHANTRIC;  Surgeon: Mauri Pole, MD;  Location: [facility];  Service: Orthopedics;  Laterality: Left;  . RIGHT/LEFT HEART CATH AND CORONARY ANGIOGRAPHY N/A 03/12/2017   Procedure: Right/Left Heart Cath and Coronary  Angiography;  Surgeon: Jolaine Artist, MD;  Location: [facility];  Service: Cardiovascular;  Laterality: N/A;     Current Meds  Medication Sig  . acetaminophen (TYLENOL) 325 MG tablet Take 325 mg by mouth 2 (two) times daily.  Marland Kitchen albuterol (PROVENTIL HFA;VENTOLIN HFA) 108 (90 Base) MCG/ACT inhaler Inhale 2 puffs into the lungs every 6 (six) hours as needed for wheezing or shortness of breath.  Marland Kitchen alendronate (FOSAMAX) 70 MG tablet Take 70 mg by mouth once a week.   Marland Kitchen amLODipine (NORVASC) 10 MG tablet Take 10 mg by mouth daily.  Marland Kitchen azelastine (ASTELIN) 0.1 % nasal spray Place 2 sprays into both nostrils daily as needed. Use in each nostril as directed   . Cholecalciferol (DIALYVITE VITAMIN D 5000 PO) Take 2 capsules by mouth daily.  Marland Kitchen dicyclomine (BENTYL) 10 MG capsule Take 1-2 capsules (10-20 mg total) by mouth 3 (three) times daily as needed for spasms.  Marland Kitchen docusate sodium 100 MG CAPS Take 100 mg by mouth 2 (two) times daily. (Patient taking differently: Take 100 mg by mouth daily as needed (constipation). )  . doxazosin (CARDURA) 8 MG tablet Take 8 mg by mouth daily.  . famotidine (PEPCID) 40 MG tablet Take 1 tablet (40 mg total) by mouth 2 (two) times daily.  . furosemide (LASIX) 40 MG tablet Take 20 mg by mouth daily.   Marland Kitchen HYDROcodone-acetaminophen (NORCO) 5-325 MG per tablet Take 1-2 tablets by mouth every 6 (six) hours as needed.  . loratadine (CLARITIN) 10 MG tablet Take 10 mg by mouth daily.  . montelukast (SINGULAIR) 10 MG tablet Take 10 mg by mouth daily.   Vladimir Faster Glycol-Propyl Glycol (SYSTANE OP) Apply to eye as needed.  Marland Kitchen rOPINIRole (REQUIP) 1 MG tablet Take 1 mg by mouth at bedtime.   . sertraline (ZOLOFT) 50 MG tablet Take 25 mg by mouth daily.   Marland Kitchen tiZANidine (ZANAFLEX) 4 MG tablet Take 4 mg by mouth 2 (two) times daily. For pain; 1 -2 tablets depending on pain level  . zolpidem (AMBIEN) 10 MG tablet Take 10 mg by mouth at bedtime as needed for sleep.      Allergies:   Patient has no known allergies.   Social History   Tobacco Use  . Smoking status: Former Smoker    Quit date: 08/11/1981    Years since quitting: 38.3  . Smokeless tobacco: Never Used  Substance Use Topics  . Alcohol use: Yes    Comment: beer- socially  . Drug use: No     Family Hx: The patient's family history includes Brain cancer in his brother; Breast cancer in his sister; Cervical cancer in his mother; Colon cancer in his mother; Heart attack in his father; Liver cancer in his brother; Stroke in his father; Throat cancer in his brother. There is no history of Esophageal cancer,  Rectal cancer, or Stomach cancer.  ROS:   Please see the history of present illness.     All other systems reviewed and are negative.   Labs/Other Tests and Data Reviewed:    Recent Labs: 05/19/2019: BUN 15; Creatinine, Ser 1.25; Hemoglobin 11.0; Platelets 207.0; Potassium 3.6; Sodium 140   Recent Lipid Panel Lab Results  Component Value Date/Time   CHOL  04/25/2009 01:55 AM    134        ATP III CLASSIFICATION:  <200     mg/dL   Desirable  200-239  mg/dL   Borderline High  >=240    mg/dL   High          TRIG 57 04/25/2009 01:55 AM   HDL 73 04/25/2009 01:55 AM   CHOLHDL 1.8 04/25/2009 01:55 AM   LDLCALC  04/25/2009 01:55 AM    50        Total Cholesterol/HDL:CHD Risk Coronary Heart Disease Risk Table                     Men   Women  1/2 Average Risk   3.4   3.3  Average Risk       5.0   4.4  2 X Average Risk   9.6   7.1  3 X Average Risk  23.4   11.0        Use the calculated Patient Ratio above and the CHD Risk Table to determine the patient's CHD Risk.        ATP III CLASSIFICATION (LDL):  <100     mg/dL   Optimal  100-129  mg/dL   Near or Above                    Optimal  130-159  mg/dL   Borderline  160-189  mg/dL   High  >190     mg/dL   Very High    Wt Readings from Last 3 Encounters:  12/09/19 140 lb (63.5 kg)  11/22/19 142 lb (64.4 kg)  09/15/19  145 lb 2 oz (65.8 kg)     Objective:    Vital Signs:  BP (!) 131/58   Pulse 86   Temp (!) 96.6 F (35.9 C)   Ht 5' 7"  (1.702 m)   Wt 140 lb (63.5 kg)   BMI 21.93 kg/m     ASSESSMENT & PLAN:    1.  OSA - The PAP download was reviewed today and showed an AHI of 3.3/hr on 9 cm H2O with 63% compliance in using more than 4 hours nightly.  The patient has been using and benefiting from PAP use and will continue to benefit from therapy. I encouraged him to adjust his humidity to help with mouth dryness.  His compliance was down some due to his wife being in the hospital so he was spending time there with her.   2.  HTN  -BP controlled -continue amlodipine 4m daily, Doxazosin 874mdaily  COVID-19 Education: The signs and symptoms of COVID-19 were discussed with the patient and how to seek care for testing (follow up with PCP or arrange E-visit).  The importance of social distancing was discussed today.  Patient Risk:   After full review of this patient's clinical status, I feel that they are at least moderate risk at this time.  Time:   Today, I have spent 15 minutes on telemedicine discussing medical problems including OSA, HTN and reviewing patient's chart including  PAP compliance download from DME on [employer].  Medication Adjustments/Labs and Tests Ordered: Current medicines are reviewed at length with the patient today.  Concerns regarding medicines are outlined above.  Tests Ordered: No orders of the defined types were placed in this encounter.  Medication Changes: No orders of the defined types were placed in this encounter.   Disposition:  Follow up in 1 year(s)  Signed, Fransico Him, MD  12/09/2019 11:05 AM    [facility]

## 2019-12-09 NOTE — Telephone Encounter (Signed)
-----   Message from Antonieta Iba, RN sent at 12/09/2019 11:13 AM EST ----- From Dr. Elige Radon - followup with me in 1 year.  Send note to [facility] to order PAP supplies"  I have put in 1 year FU recall.  Thanks! Carly

## 2019-12-09 NOTE — Telephone Encounter (Signed)
Order sent to [employer] via community message.

## 2019-12-28 DIAGNOSIS — H26493 Other secondary cataract, bilateral: Secondary | ICD-10-CM | POA: Diagnosis not present

## 2019-12-28 DIAGNOSIS — H524 Presbyopia: Secondary | ICD-10-CM | POA: Diagnosis not present

## 2019-12-28 DIAGNOSIS — H43813 Vitreous degeneration, bilateral: Secondary | ICD-10-CM | POA: Diagnosis not present

## 2020-01-19 DIAGNOSIS — S61319A Laceration without foreign body of unspecified finger with damage to nail, initial encounter: Secondary | ICD-10-CM | POA: Diagnosis not present

## 2020-01-19 DIAGNOSIS — Z23 Encounter for immunization: Secondary | ICD-10-CM | POA: Diagnosis not present

## 2020-01-23 ENCOUNTER — Telehealth: Payer: Self-pay | Admitting: Internal Medicine

## 2020-01-23 NOTE — Telephone Encounter (Signed)
Patient's wife indicates that patient was sent omeprazole again from [facility] in addition to his famotidine. After discussing with patient's wife, it appears Dr Elsworth Soho is sending this medication. She indicates that Dr Elsworth Soho says Dr [name] office never made them aware that patient was to discontinue omeprazole. I advised that we did send out 11/22/19 note to him but will resend it today. We are avoiding PPI's due to microscopic colitis risk. I have asked that Mrs.Crisantos call me back should she continue to have issues with this. I have sent our note to both Dr Elsworth Soho and Dr Dagmar Hait since patient sees both of these physicians and I am unsure which physician is prescribing this medication.

## 2020-01-23 NOTE — Telephone Encounter (Signed)
Pt's wife [name] would like to speak with you about pt's medications. Pls call her.

## 2020-02-09 DIAGNOSIS — H26492 Other secondary cataract, left eye: Secondary | ICD-10-CM | POA: Diagnosis not present

## 2020-02-20 DIAGNOSIS — I129 Hypertensive chronic kidney disease with stage 1 through stage 4 chronic kidney disease, or unspecified chronic kidney disease: Secondary | ICD-10-CM | POA: Diagnosis not present

## 2020-02-20 DIAGNOSIS — R252 Cramp and spasm: Secondary | ICD-10-CM | POA: Diagnosis not present

## 2020-02-20 DIAGNOSIS — G4733 Obstructive sleep apnea (adult) (pediatric): Secondary | ICD-10-CM | POA: Diagnosis not present

## 2020-02-20 DIAGNOSIS — R197 Diarrhea, unspecified: Secondary | ICD-10-CM | POA: Diagnosis not present

## 2020-02-20 DIAGNOSIS — E876 Hypokalemia: Secondary | ICD-10-CM | POA: Diagnosis not present

## 2020-02-20 DIAGNOSIS — N1831 Chronic kidney disease, stage 3a: Secondary | ICD-10-CM | POA: Diagnosis not present

## 2020-02-20 DIAGNOSIS — K900 Celiac disease: Secondary | ICD-10-CM | POA: Diagnosis not present

## 2020-02-20 DIAGNOSIS — R609 Edema, unspecified: Secondary | ICD-10-CM | POA: Diagnosis not present

## 2020-02-20 DIAGNOSIS — K219 Gastro-esophageal reflux disease without esophagitis: Secondary | ICD-10-CM | POA: Diagnosis not present

## 2020-02-20 DIAGNOSIS — Z1331 Encounter for screening for depression: Secondary | ICD-10-CM | POA: Diagnosis not present

## 2020-02-21 ENCOUNTER — Encounter: Payer: Self-pay | Admitting: Internal Medicine

## 2020-02-21 DIAGNOSIS — D649 Anemia, unspecified: Secondary | ICD-10-CM | POA: Diagnosis not present

## 2020-03-05 ENCOUNTER — Telehealth: Payer: Self-pay | Admitting: Cardiology

## 2020-03-05 NOTE — Telephone Encounter (Signed)
   Went to chart to check new notes, pt is trying to get eye surgery appt. Advised he called Dr. Theodosia Blender office

## 2020-03-08 DIAGNOSIS — H26491 Other secondary cataract, right eye: Secondary | ICD-10-CM | POA: Diagnosis not present

## 2020-03-22 DIAGNOSIS — R05 Cough: Secondary | ICD-10-CM | POA: Diagnosis not present

## 2020-03-22 DIAGNOSIS — K219 Gastro-esophageal reflux disease without esophagitis: Secondary | ICD-10-CM | POA: Diagnosis not present

## 2020-03-22 DIAGNOSIS — J309 Allergic rhinitis, unspecified: Secondary | ICD-10-CM | POA: Diagnosis not present

## 2020-03-26 ENCOUNTER — Encounter: Payer: Self-pay | Admitting: Internal Medicine

## 2020-03-26 ENCOUNTER — Ambulatory Visit (INDEPENDENT_AMBULATORY_CARE_PROVIDER_SITE_OTHER): Payer: Medicare Other | Admitting: Internal Medicine

## 2020-03-26 VITALS — BP 120/64 | HR 78 | Ht 67.0 in | Wt 140.2 lb

## 2020-03-26 DIAGNOSIS — K900 Celiac disease: Secondary | ICD-10-CM

## 2020-03-26 DIAGNOSIS — R1310 Dysphagia, unspecified: Secondary | ICD-10-CM

## 2020-03-26 DIAGNOSIS — K52831 Collagenous colitis: Secondary | ICD-10-CM | POA: Diagnosis not present

## 2020-03-26 DIAGNOSIS — R1319 Other dysphagia: Secondary | ICD-10-CM

## 2020-03-26 DIAGNOSIS — K219 Gastro-esophageal reflux disease without esophagitis: Secondary | ICD-10-CM

## 2020-03-26 NOTE — Patient Instructions (Signed)
If you are age 83 or older, your body mass index should be between 23-30. Your Body mass index is 21.97 kg/m. If this is out of the aforementioned range listed, please consider follow up with your Primary Care Provider.  If you are age 63 or younger, your body mass index should be between 19-25. Your Body mass index is 21.97 kg/m. If this is out of the aformentioned range listed, please consider follow up with your Primary Care Provider.   You have been scheduled for a Barium Esophogram at [facility] (1st floor of the hospital) on Monday, 04-02-20 at 10:30am. Please arrive 15 minutes prior to your appointment for registration. Make certain not to have anything to eat or drink 3 hours prior to your test. If you need to reschedule for any reason, please contact radiology at [phone] to do so. __________________________________________________________________ A barium swallow is an examination that concentrates on views of the esophagus. This tends to be a double contrast exam (barium and two liquids which, when combined, create a gas to distend the wall of the oesophagus) or single contrast (non-ionic iodine based). The study is usually tailored to your symptoms so a good history is essential. Attention is paid during the study to the form, structure and configuration of the esophagus, looking for functional disorders (such as aspiration, dysphagia, achalasia, motility and reflux) EXAMINATION You may be asked to change into a gown, depending on the type of swallow being performed. A radiologist and radiographer will perform the procedure. The radiologist will advise you of the type of contrast selected for your procedure and direct you during the exam. You will be asked to stand, sit or lie in several different positions and to hold a small amount of fluid in your mouth before being asked to swallow while the imaging is performed .In some instances you may be asked to swallow barium coated  marshmallows to assess the motility of a solid food bolus. The exam can be recorded as a digital or video fluoroscopy procedure. POST PROCEDURE It will take 1-2 days for the barium to pass through your system. To facilitate this, it is important, unless otherwise directed, to increase your fluids for the next 24-48hrs and to resume your normal diet.  This test typically takes about 30 minutes to perform. ____________________________________________________________________    Continue famotidine 74m twice daily.   Thank you for entrusting me with your care and for choosing [facility] Dr. JZenovia Jarred

## 2020-03-26 NOTE — Progress Notes (Signed)
   Subjective:    Patient ID: [name], male    DOB: [birth year], 85 y.o.   MRN: [id number]  HPI [name] is an 85 year old male with a history of GERD and Schatzki's ring, history of celiac disease, history of collagenous colitis, colonic diverticulosis who is here for follow-up.  He is here today with his wife and was last seen in February 2021.  He reports he has been doing well but having increasing issues with solid food dysphagia.  This is worse with foods like meats, breads and potatoes.  There are times during eating that he will have to stop eating for as long as 30 or 45 minutes.  During this time he will try to get food to pass into the stomach or bring food up and vomited out.  No odynophagia.  No trouble with liquids.  Heartburn is much better controlled since increasing famotidine to 40 mg twice daily.  If he does have breakthrough heartburn he uses Gaviscon.  No abdominal pain.  Diarrhea well controlled and he is off of budesonide.   Review of Systems As per HPI, otherwise negative  Current Medications, Allergies, Past Medical History, Past Surgical History, Family History and Social History were reviewed in [employer] record.      Objective:   Physical Exam BP 120/64   Pulse 78   Ht 5' 7"  (1.702 m)   Wt 140 lb 4 oz (63.6 kg)   BMI 21.97 kg/m  Gen: awake, alert, NAD HEENT: anicteric, op clear CV: RRR, no mrg Pulm: CTA b/l Abd: soft, NT/ND, +BS throughout Ext: no c/c/e Neuro: nonfocal      Assessment & Plan:  85 year old male with a history of GERD and Schatzki's ring, history of celiac disease, history of collagenous colitis, colonic diverticulosis who is here for follow-up.  1.  Dysphagia/history of GERD and Schatzki's ring --we discussed his symptoms today and they are concerning given that he can have what sounds like a transient food impaction for as long as 30 to 45 minutes.  He may benefit from EGD with dilation but I would  like to further evaluate first with a barium esophagram plus tablet.  Reflux seems mostly controlled on high-dose H2 blocker.  We stopped PPI given his history of collagenous colitis --Barium esophagram with tablet --Continue famotidine 40 mg twice daily --Continue Gaviscon per bottle instruction for breakthrough heartburn --Upper endoscopy if warranted after barium esophagram  2.  Collagenous colitis --in remission after budesonide taper.  He is also budesonide altogether. --Observe and monitor for recurrence  3.  Celiac disease --continue gluten-free diet  30 minutes total spent today including patient facing time, coordination of care, reviewing medical history/procedures/pertinent radiology studies, and documentation of the encounter.

## 2020-03-27 ENCOUNTER — Telehealth: Payer: Self-pay | Admitting: Internal Medicine

## 2020-03-27 NOTE — Telephone Encounter (Signed)
Patients wife calling in requesting to speak with you in reference to his medications

## 2020-03-28 ENCOUNTER — Telehealth: Payer: Self-pay

## 2020-03-28 MED ORDER — FAMOTIDINE 40 MG PO TABS: 40.0000 mg | ORAL_TABLET | Freq: Two times a day (BID) | ORAL | 2 refills | Status: DC

## 2020-03-28 NOTE — Telephone Encounter (Signed)
LM on VM.

## 2020-03-28 NOTE — Telephone Encounter (Signed)
Patient's wife stated he is almost out of Pepcid, which he takes twice a day.  I refilled it for him through [facility].

## 2020-04-02 ENCOUNTER — Ambulatory Visit (HOSPITAL_COMMUNITY)
Admission: RE | Admit: 2020-04-02 | Discharge: 2020-04-02 | Disposition: A | Discharge status: Home / Self Care | Payer: Medicare Other | Source: Ambulatory Visit | Attending: Internal Medicine | Admitting: Internal Medicine

## 2020-04-02 ENCOUNTER — Other Ambulatory Visit: Payer: Self-pay

## 2020-04-02 DIAGNOSIS — R1310 Dysphagia, unspecified: Secondary | ICD-10-CM | POA: Diagnosis present

## 2020-04-02 DIAGNOSIS — R1319 Other dysphagia: Secondary | ICD-10-CM

## 2020-04-12 ENCOUNTER — Other Ambulatory Visit: Payer: Self-pay

## 2020-04-12 DIAGNOSIS — D509 Iron deficiency anemia, unspecified: Secondary | ICD-10-CM

## 2020-04-12 DIAGNOSIS — R109 Unspecified abdominal pain: Secondary | ICD-10-CM

## 2020-04-13 ENCOUNTER — Other Ambulatory Visit: Payer: Self-pay

## 2020-04-13 DIAGNOSIS — R1319 Other dysphagia: Secondary | ICD-10-CM

## 2020-04-19 ENCOUNTER — Encounter: Payer: Self-pay | Admitting: Internal Medicine

## 2020-04-19 ENCOUNTER — Ambulatory Visit (AMBULATORY_SURGERY_CENTER): Payer: Medicare Other | Admitting: Internal Medicine

## 2020-04-19 ENCOUNTER — Other Ambulatory Visit: Payer: Self-pay

## 2020-04-19 VITALS — BP 115/56 | HR 59 | Temp 98.0°F | Resp 11 | Ht 67.0 in | Wt 140.0 lb

## 2020-04-19 DIAGNOSIS — R1310 Dysphagia, unspecified: Secondary | ICD-10-CM | POA: Diagnosis not present

## 2020-04-19 DIAGNOSIS — K317 Polyp of stomach and duodenum: Secondary | ICD-10-CM

## 2020-04-19 DIAGNOSIS — K5989 Other specified functional intestinal disorders: Secondary | ICD-10-CM | POA: Diagnosis not present

## 2020-04-19 DIAGNOSIS — R933 Abnormal findings on diagnostic imaging of other parts of digestive tract: Secondary | ICD-10-CM

## 2020-04-19 DIAGNOSIS — R1319 Other dysphagia: Secondary | ICD-10-CM

## 2020-04-19 DIAGNOSIS — K900 Celiac disease: Secondary | ICD-10-CM

## 2020-04-19 DIAGNOSIS — K222 Esophageal obstruction: Secondary | ICD-10-CM | POA: Diagnosis not present

## 2020-04-19 MED ORDER — FLUCONAZOLE 100 MG PO TABS: ORAL_TABLET | ORAL | 0 refills | Status: DC

## 2020-04-19 MED ORDER — SODIUM CHLORIDE 0.9 % IV SOLN: 500.0000 mL | Freq: Once | INTRAVENOUS | Status: DC

## 2020-04-19 NOTE — Op Note (Addendum)
[name] Patient Name: [name] Procedure Date: 04/19/2020 8:10 AM MRN: [id number] Endoscopist: Jerene Bears , MD Age: 85 Referring MD:  Date of Birth: [birth year] Gender: Male Account #: [id number] Procedure:                Upper GI endoscopy Indications:              Dysphagia, abnormal barium esophagram                            (dysmotility), history of celiac disease Medicines:                Monitored Anesthesia Care Procedure:                Pre-Anesthesia Assessment:                           - Prior to the procedure, a History and Physical                            was performed, and patient medications and                            allergies were reviewed. The patient's tolerance of                            previous anesthesia was also reviewed. The risks                            and benefits of the procedure and the sedation                            options and risks were discussed with the patient.                            All questions were answered, and informed consent                            was obtained. Prior Anticoagulants: The patient has                            taken no previous anticoagulant or antiplatelet                            agents. ASA Grade Assessment: II - A patient with                            mild systemic disease. After reviewing the risks                            and benefits, the patient was deemed in                            satisfactory condition to undergo the procedure.  After obtaining informed consent, the endoscope was                            passed under direct vision. Throughout the                            procedure, the patient's blood pressure, pulse, and                            oxygen saturations were monitored continuously. The                            Endoscope was introduced through the mouth, and                            advanced to the second part of  duodenum. The upper                            GI endoscopy was accomplished without difficulty.                            The patient tolerated the procedure well. Scope In: Scope Out: Findings:                 Patchy, white plaques were found in the middle                            third of the esophagus and in the lower third of                            the esophagus.                           One benign-appearing, intrinsic moderate                            (circumferential scarring or stenosis; an endoscope                            may pass) stenosis was found 40 cm from the                            incisors. This stenosis measured 1.2 cm (inner                            diameter) x less than one cm (in length). The                            stenosis was traversed. A TTS dilator was passed                            through the scope. Dilation with a 16-17-18 mm  balloon dilator was performed to 17 mm. The                            dilation site was examined and showed moderate                            mucosal disruption.                           The entire examined stomach was normal.                           The cardia and gastric fundus were normal on                            retroflexion.                           Two 3 to 5 mm sessile polyps were found in the                            second portion of the duodenum. These polyps were                            removed with a cold snare. Resection and retrieval                            were complete.                           The exam of the duodenum was otherwise normal. Complications:            No immediate complications. Estimated Blood Loss:     Estimated blood loss was minimal. Impression:               - Esophageal plaques were found, consistent with                            candidiasis.                           - Benign-appearing esophageal stenosis. Dilated to                             17 mm with balloon.                           - Normal stomach.                           - Two duodenal polyps. Resected and retrieved. Recommendation:           - Patient has a contact number available for                            emergencies. The signs and symptoms of potential  delayed complications were discussed with the                            patient. Return to normal activities tomorrow.                            Written discharge instructions were provided to the                            patient.                           - Post-dilation diet and then advance diet as                            tolerated.                           - Continue present medications.                           - Fluconazole 200 mg x 1 day then 100 mg x 13 days.                           - Await pathology results.                           - EGD can be repeated if needed for dysphagia and                            repeat dilation.                           - Office followup in 2-3 months to ensure                            improvement in symptoms, though if trouble                            swallowing persists call me before next appointment. Jerene Bears, MD 04/19/2020 8:49:39 AM This report has been signed electronically.

## 2020-04-19 NOTE — Progress Notes (Signed)
Pt's states no medical or surgical changes since previsit or office visit.  CW - vitals

## 2020-04-19 NOTE — Progress Notes (Signed)
Report to PACU, RN, vss, BBS= Clear.  

## 2020-04-19 NOTE — Patient Instructions (Addendum)
Follow dilatation diet today (dilatation diet given to you )   Await pathology results on duodenal polyps removed today  Pick up medication for yeast infection seen in your esophagus ( sent to your pharmacy)  FOLLOW UP OFFICE APPOINTMENT IN 2-3 MONTHS-MAKE THIS APPOINTMENT   YOU HAD AN ENDOSCOPIC PROCEDURE TODAY AT [place]:   Refer to the procedure report that was given to you for any specific questions about what was found during the examination.  If the procedure report does not answer your questions, please call your gastroenterologist to clarify.  If you requested that your care partner not be given the details of your procedure findings, then the procedure report has been included in a sealed envelope for you to review at your convenience later.  YOU SHOULD EXPECT: Some feelings of bloating in the abdomen. Passage of more gas than usual.  Walking can help get rid of the air that was put into your GI tract during the procedure and reduce the bloating. If you had a lower endoscopy (such as a colonoscopy or flexible sigmoidoscopy) you may notice spotting of blood in your stool or on the toilet paper. If you underwent a bowel prep for your procedure, you may not have a normal bowel movement for a few days.  Please Note:  You might notice some irritation and congestion in your nose or some drainage.  This is from the oxygen used during your procedure.  There is no need for concern and it should clear up in a day or so.  SYMPTOMS TO REPORT IMMEDIATELY:     Following upper endoscopy (EGD)  Vomiting of blood or coffee ground material  New chest pain or pain under the shoulder blades  Painful or persistently difficult swallowing  New shortness of breath  Fever of 100F or higher  Black, tarry-looking stools  For urgent or emergent issues, a gastroenterologist can be reached at any hour by calling [phone]. Do not use MyChart messaging for urgent concerns.     DIET:  FOLLOW DILATATION DIET GIVEN TO YOU    Drink plenty of fluids but you should avoid alcoholic beverages for 24 hours.  ACTIVITY:  You should plan to take it easy for the rest of today and you should NOT DRIVE or use heavy machinery until tomorrow (because of the sedation medicines used during the test).    FOLLOW UP: Our staff will call the number listed on your records 48-72 hours following your procedure to check on you and address any questions or concerns that you may have regarding the information given to you following your procedure. If we do not reach you, we will leave a message.  We will attempt to reach you two times.  During this call, we will ask if you have developed any symptoms of COVID 19. If you develop any symptoms (ie: fever, flu-like symptoms, shortness of breath, cough etc.) before then, please call [phone].  If you test positive for Covid 19 in the 2 weeks post procedure, please call and report this information to [place].    If any biopsies were taken you will be contacted by phone or by letter within the next 1-3 weeks.  Please call us at [phone] if you have not heard about the biopsies in 3 weeks.    SIGNATURES/CONFIDENTIALITY: You and/or your care partner have signed paperwork which will be entered into your electronic medical record.  These signatures attest to the fact that that the information  above on your After Visit Summary has been reviewed and is understood.  Full responsibility of the confidentiality of this discharge information lies with you and/or your care-partner.

## 2020-04-19 NOTE — Progress Notes (Signed)
Called to room to assist during endoscopic procedure.  Patient ID and intended procedure confirmed with present staff. Received instructions for my participation in the procedure from the performing physician.  

## 2020-04-23 ENCOUNTER — Telehealth: Payer: Self-pay | Admitting: *Deleted

## 2020-04-23 ENCOUNTER — Encounter: Payer: Self-pay | Admitting: Internal Medicine

## 2020-04-23 DIAGNOSIS — L570 Actinic keratosis: Secondary | ICD-10-CM | POA: Diagnosis not present

## 2020-04-23 DIAGNOSIS — L814 Other melanin hyperpigmentation: Secondary | ICD-10-CM | POA: Diagnosis not present

## 2020-04-23 DIAGNOSIS — Z85828 Personal history of other malignant neoplasm of skin: Secondary | ICD-10-CM | POA: Diagnosis not present

## 2020-04-23 DIAGNOSIS — L820 Inflamed seborrheic keratosis: Secondary | ICD-10-CM | POA: Diagnosis not present

## 2020-04-23 NOTE — Telephone Encounter (Signed)
  Follow up Call-  Call back number 04/19/2020 06/16/2019  Post procedure Call Back phone  # [phone]  Permission to leave phone message Yes Yes  Some recent data might be hidden     Patient questions:  Do you have a fever, pain , or abdominal swelling? No. Pain Score  0 *  Have you tolerated food without any problems? Yes.    Have you been able to return to your normal activities? Yes.    Do you have any questions about your discharge instructions: Diet   No. Medications  No. Follow up visit  No.  Do you have questions or concerns about your Care? No.  Actions: * If pain score is 4 or above: No action needed, pain <4.  1. Have you developed a fever since your procedure? no  2.   Have you had an respiratory symptoms (SOB or cough) since your procedure? no  3.   Have you tested positive for COVID 19 since your procedure no  4.   Have you had any family members/close contacts diagnosed with the COVID 19 since your procedure?  no   If yes to any of these questions please route to Joylene John, RN and Erenest Rasher, RN

## 2020-05-30 ENCOUNTER — Encounter: Payer: Self-pay | Admitting: *Deleted

## 2020-06-20 DIAGNOSIS — J019 Acute sinusitis, unspecified: Secondary | ICD-10-CM | POA: Diagnosis not present

## 2020-07-03 ENCOUNTER — Ambulatory Visit (INDEPENDENT_AMBULATORY_CARE_PROVIDER_SITE_OTHER): Payer: Medicare Other | Admitting: Internal Medicine

## 2020-07-03 ENCOUNTER — Other Ambulatory Visit (INDEPENDENT_AMBULATORY_CARE_PROVIDER_SITE_OTHER): Payer: Medicare Other

## 2020-07-03 ENCOUNTER — Encounter: Payer: Self-pay | Admitting: Internal Medicine

## 2020-07-03 VITALS — BP 124/60 | HR 70 | Ht 67.0 in | Wt 140.0 lb

## 2020-07-03 DIAGNOSIS — R1032 Left lower quadrant pain: Secondary | ICD-10-CM

## 2020-07-03 DIAGNOSIS — K219 Gastro-esophageal reflux disease without esophagitis: Secondary | ICD-10-CM | POA: Diagnosis not present

## 2020-07-03 DIAGNOSIS — R1310 Dysphagia, unspecified: Secondary | ICD-10-CM

## 2020-07-03 DIAGNOSIS — K900 Celiac disease: Secondary | ICD-10-CM | POA: Diagnosis not present

## 2020-07-03 DIAGNOSIS — K224 Dyskinesia of esophagus: Secondary | ICD-10-CM

## 2020-07-03 DIAGNOSIS — R1319 Other dysphagia: Secondary | ICD-10-CM

## 2020-07-03 LAB — BUN: BUN: 16 mg/dL (ref 6–23)

## 2020-07-03 LAB — CREATININE, SERUM: Creatinine, Ser: 1.51 mg/dL — ABNORMAL HIGH (ref 0.40–1.50)

## 2020-07-03 NOTE — Patient Instructions (Addendum)
Continue famotidine twice daily. ________________________________________________________________  Continue dicyclomine as needed. ________________________________________________________________  Please purchase the following medications over the counter and take as directed: Miralax 17 grams (1 capful) dissolved in at least 8 ounces water/juice once daily.  _______________________________________________________________  Please follow up with Dr Hilarie Fredrickson in the office on 09/13/20 at 2:30 pm. ________________________________________________________________  Your provider has requested that you go to the basement level for lab work before leaving today. Press "B" on the elevator. The lab is located at the first door on the left as you exit the elevator.  ________________________________________________________________  Dennis Bast have been scheduled for a CT scan of the abdomen and pelvis at [facility] (1st floor of hospital)  You are scheduled on Friday, 07/06/20  at 2:30 pm. You should arrive 15 minutes prior to your appointment time for registration. Please follow the written instructions below on the day of your exam:  WARNING: IF YOU ARE ALLERGIC TO IODINE/X-RAY DYE, PLEASE NOTIFY RADIOLOGY IMMEDIATELY AT [phone]! YOU WILL BE GIVEN A 13 HOUR PREMEDICATION PREP.  1) Do not eat or drink anything after 10: 30 am (4 hours prior to your test) 2) You have been given 2 bottles of oral contrast to drink. The solution may taste better if refrigerated, but do NOT add ice or any other liquid to this solution. Shake well before drinking.    Drink 1 bottle of contrast @ 12:30 pm (2 hours prior to your exam)  Drink 1 bottle of contrast @ 1:30 pm (1 hour prior to your exam)  You may take any medications as prescribed with a small amount of water, if necessary. If you take any of the following medications: METFORMIN, GLUCOPHAGE, GLUCOVANCE, AVANDAMET, RIOMET, FORTAMET, [place] MET, JANUMET,  GLUMETZA or METAGLIP, you MAY be asked to HOLD this medication 48 hours AFTER the exam.  The purpose of you drinking the oral contrast is to aid in the visualization of your intestinal tract. The contrast solution may cause some diarrhea. Depending on your individual set of symptoms, you may also receive an intravenous injection of x-ray contrast/dye. Plan on being at [facility] for 30 minutes or longer, depending on the type of exam you are having performed.  This test typically takes 30-45 minutes to complete.  If you have any questions regarding your exam or if you need to reschedule, you may call the CT department at [phone] between the hours of 8:00 am and 5:00 pm, Monday-Friday.  _____________________________________________________________________

## 2020-07-03 NOTE — Progress Notes (Signed)
Subjective:    Patient ID: [name], male    DOB: 1935-07-23, 85 y.o.   MRN: [id number]  HPI [name] is an 85 year old male with a history of GERD, Schatzki's ring, esophageal dysmotility, esophageal candidiasis, celiac disease, collagenous colitis, diverticulosis who is here for follow-up.  He is here alone today.  He reports that he continues to have some issues with swallowing.  This is better since upper endoscopy with balloon dilation.  He reports at times it simply feels like solid and liquid will sit in his esophagus and take extra time to go down.  He reports it finally does go down on its own.  He is not having much issue with heartburn.  He reports his appetite for the most part has been normal though he is trying to eat smaller meals.  Of late he is reports moderate to severe lower abdominal pain usually before bowel movement.  He is having a bowel movement once occasionally twice a day.  He is not having diarrhea.  The pain is across the lower abdomen and intense.  Bentyl seems to help only a little.  He has not had blood in his stool or melena.  He feels that his abdomen is swollen and bloated at times.  He has continued famotidine 40 mg twice daily.  He also continues on chronic hydrocodone for chronic pain.  EGD was performed on 04/19/2020.  There was candidiasis.  There was a moderate stenosis at 40 cm from the incisors.  This was dilated to 17 mm with balloon with moderate mucosal disruption.  The stomach was normal.  There were 2 small sessile polyps in the duodenum removed with cold snare.  Pathology showed these polyps to be benign without adenomatous change.  He was treated with fluconazole for 2 weeks after this procedure.  Review of Systems As per HPI, otherwise negative  Current Medications, Allergies, Past Medical History, Past Surgical History, Family History and Social History were reviewed in [employer] record.     Objective:    Physical Exam BP 124/60   Pulse 70   Ht 5' 7"  (1.702 m)   Wt 140 lb (63.5 kg)   BMI 21.93 kg/m  Gen: awake, alert, NAD HEENT: anicteric CV: RRR, no mrg Pulm: CTA b/l Abd: soft, there is significant left lower quadrant tenderness with deep palpation with mild rebound, no guarding, bowel sounds are present, abdomen is not distended.   Ext: no c/c/e Neuro: nonfocal      Assessment & Plan:  85 year old male with a history of GERD, Schatzki's ring, esophageal dysmotility, esophageal candidiasis, celiac disease, collagenous colitis, diverticulosis who is here for follow-up.  1.  Esophageal dysphagia/GERD --after dilation a 17 mm I feel that his residual dysphagia symptoms are related to esophageal dysmotility.  We discussed this today.  Unfortunately there is no great medical treatment for esophageal dysmotility.  Given that we dilated the esophagus to 17 mm I would not expect dilating again to a larger size to improve symptoms significantly.  He understands this.  We will continue to control GERD that we are avoiding PPI given his history of microscopic colitis.  If swallowing definitively worsens he is asked to let me know --Continue famotidine 40 mg twice daily --Soft diet with attention to chewing food well and eating slowly  2.  Lower abdominal pain/left lower quadrant tenderness --this could be related to constipation though given his history of diverticulosis and tenderness on exam I would like  to exclude diverticulitis.  I recommended the following --CT scan of the abdomen pelvis with contrast --Begin MiraLAX therapy 17 g daily to help soften stool and aid in bowel movement which may be causing some of the left lower quadrant tenderness and pain before defecation --He is up-to-date with colonoscopy  3.  Collagenous colitis --in remission after previous budesonide taper, no recurrent diarrhea.  4.  Celiac disease --continue gluten-free diet  2 to 58-monthfollow-up  30 minutes  total spent today including patient facing time, coordination of care, reviewing medical history/procedures/pertinent radiology studies, and documentation of the encounter.

## 2020-07-06 ENCOUNTER — Other Ambulatory Visit: Payer: Self-pay

## 2020-07-06 ENCOUNTER — Ambulatory Visit (HOSPITAL_COMMUNITY)
Admission: RE | Admit: 2020-07-06 | Discharge: 2020-07-06 | Disposition: A | Discharge status: Home / Self Care | Payer: Medicare Other | Source: Ambulatory Visit | Attending: Internal Medicine | Admitting: Internal Medicine

## 2020-07-06 DIAGNOSIS — R1032 Left lower quadrant pain: Secondary | ICD-10-CM | POA: Insufficient documentation

## 2020-07-06 DIAGNOSIS — R109 Unspecified abdominal pain: Secondary | ICD-10-CM | POA: Diagnosis not present

## 2020-07-06 MED ORDER — IOHEXOL 300 MG/ML  SOLN
100.0000 mL | Freq: Once | INTRAMUSCULAR | Status: AC | PRN
  Administered 2020-07-06: 75 mL via INTRAVENOUS

## 2020-09-04 ENCOUNTER — Encounter: Payer: Self-pay | Admitting: *Deleted

## 2020-09-13 ENCOUNTER — Ambulatory Visit (INDEPENDENT_AMBULATORY_CARE_PROVIDER_SITE_OTHER): Payer: Medicare Other | Admitting: Internal Medicine

## 2020-09-13 ENCOUNTER — Encounter: Payer: Self-pay | Admitting: Internal Medicine

## 2020-09-13 VITALS — BP 110/40 | HR 67 | Ht 66.0 in | Wt 143.0 lb

## 2020-09-13 DIAGNOSIS — R1030 Lower abdominal pain, unspecified: Secondary | ICD-10-CM | POA: Diagnosis not present

## 2020-09-13 DIAGNOSIS — K224 Dyskinesia of esophagus: Secondary | ICD-10-CM | POA: Diagnosis not present

## 2020-09-13 DIAGNOSIS — K589 Irritable bowel syndrome without diarrhea: Secondary | ICD-10-CM | POA: Diagnosis not present

## 2020-09-13 DIAGNOSIS — K5730 Diverticulosis of large intestine without perforation or abscess without bleeding: Secondary | ICD-10-CM

## 2020-09-13 DIAGNOSIS — K900 Celiac disease: Secondary | ICD-10-CM

## 2020-09-13 DIAGNOSIS — K219 Gastro-esophageal reflux disease without esophagitis: Secondary | ICD-10-CM

## 2020-09-13 NOTE — Progress Notes (Signed)
   Subjective:    Patient ID: [name], male    DOB: 08-May-1935, 85 y.o.   MRN: [id number]  HPI Daniyal Tabor is an 85 year old male with a history of GERD, esophageal ring, esophageal dysmotility, prior esophageal candidiasis, celiac disease, collagenous colitis in remission, diverticulosis and IBS with abdominal pain who is here for follow-up.  He is here today with his daughter.  He was last seen on 07/03/2020.  After his last visit due to left-sided pain I ordered a CT scan of the abdomen and pelvis.  This was done and did not show diverticulitis.  I recommended MiraLAX therapy thinking that the pain was secondary to constipation and symptomatic diverticulosis.   He reports that the acute left-sided pain improved but he does still have intermittent issues with lower abdominal pain.  This does seem to be a little more prevalent before bowel movement.  He uses Bentyl 10 mg 1 or 2 tablets and 30 to 45 minutes later the pain is resolved.  He is happy with how this medication is working.  His heartburn has been under good control and his dysphagia symptom has also not been bothersome of late.  He does occasionally have coughing with eating or swallowing which he thinks may be getting fluid "down the wrong pipe".   Review of Systems As per HPI, otherwise negative  Current Medications, Allergies, Past Medical History, Past Surgical History, Family History and Social History were reviewed in [employer] record.     Objective:   Physical Exam BP (!) 110/40   Pulse 67   Ht 5' 6"  (1.676 m)   Wt 143 lb (64.9 kg)   SpO2 98%   BMI 23.08 kg/m  Gen: awake, alert, NAD HEENT: anicteric  CV: RRR, no mrg Pulm: CTA b/l Abd: soft, NT/ND, +BS throughout Ext: no c/c/e Neuro: nonfocal     Assessment & Plan:  85 year old male with a history of GERD, esophageal ring, esophageal dysmotility, prior esophageal candidiasis, celiac disease, collagenous colitis in remission,  diverticulosis and IBS with abdominal pain who is here for follow-up.  1.  IBS with abdominal pain/constipation --CT scan reviewed today which was reassuring.  I do think he would do better with MiraLAX therapy on a daily basis.  We discussed this today.  He has been reluctant and also does not like the taste.  He will try adding this to other fluids other than water where he will be able to taste it.  We discussed this symptom today and he will also continue the Bentyl as needed --MiraLAX 17 g daily --Bentyl 10 mg, 1 to 2 tablets up to 3 times a day as needed for crampy abdominal pain --He is up-to-date with colonoscopy  2.  GERD/esophageal dysphagia/esophageal dysmotility--17 mm esophageal dilation helped but with some residual symptoms.  We discussed how this is secondary to esophageal dysmotility.  Heartburn symptoms are well controlled on higher dose Pepcid twice daily.  We are avoiding PPIs given his history of collagenous colitis --Continue famotidine 40 mg twice daily  3.  Collagenous colitis --in remission after previous budesonide taper  4.  Celiac disease --he continues gluten-free diet  20 minutes total spent today including patient facing time, coordination of care, reviewing medical history/procedures/pertinent radiology studies, and documentation of the encounter.

## 2020-09-13 NOTE — Patient Instructions (Signed)
Please follow up with Dr Hilarie Fredrickson in 6-12 months in the office.  Continue famotidine.  Continue bentyl.  Make sure to take Miralax consistently.  If you are age 83 or older, your body mass index should be between 23-30. Your Body mass index is 23.08 kg/m. If this is out of the aforementioned range listed, please consider follow up with your Primary Care Provider.  If you are age 45 or younger, your body mass index should be between 19-25. Your Body mass index is 23.08 kg/m. If this is out of the aformentioned range listed, please consider follow up with your Primary Care Provider.   Due to recent changes in healthcare laws, you may see the results of your imaging and laboratory studies on MyChart before your provider has had a chance to review them.  We understand that in some cases there may be results that are confusing or concerning to you. Not all laboratory results come back in the same time frame and the provider may be waiting for multiple results in order to interpret others.  Please give [place] 48 hours in order for your provider to thoroughly review all the results before contacting the office for clarification of your results.

## 2020-09-17 DIAGNOSIS — Z23 Encounter for immunization: Secondary | ICD-10-CM | POA: Diagnosis not present

## 2020-10-19 DIAGNOSIS — N5201 Erectile dysfunction due to arterial insufficiency: Secondary | ICD-10-CM | POA: Diagnosis not present

## 2020-10-19 DIAGNOSIS — R351 Nocturia: Secondary | ICD-10-CM | POA: Diagnosis not present

## 2020-10-19 DIAGNOSIS — N401 Enlarged prostate with lower urinary tract symptoms: Secondary | ICD-10-CM | POA: Diagnosis not present

## 2020-10-24 DIAGNOSIS — Z85828 Personal history of other malignant neoplasm of skin: Secondary | ICD-10-CM | POA: Diagnosis not present

## 2020-10-24 DIAGNOSIS — L821 Other seborrheic keratosis: Secondary | ICD-10-CM | POA: Diagnosis not present

## 2020-10-24 DIAGNOSIS — L570 Actinic keratosis: Secondary | ICD-10-CM | POA: Diagnosis not present

## 2020-10-24 DIAGNOSIS — D224 Melanocytic nevi of scalp and neck: Secondary | ICD-10-CM | POA: Diagnosis not present

## 2020-10-24 DIAGNOSIS — L300 Nummular dermatitis: Secondary | ICD-10-CM | POA: Diagnosis not present

## 2020-11-06 DIAGNOSIS — Z125 Encounter for screening for malignant neoplasm of prostate: Secondary | ICD-10-CM | POA: Diagnosis not present

## 2020-11-06 DIAGNOSIS — M810 Age-related osteoporosis without current pathological fracture: Secondary | ICD-10-CM | POA: Diagnosis not present

## 2020-11-06 DIAGNOSIS — E785 Hyperlipidemia, unspecified: Secondary | ICD-10-CM | POA: Diagnosis not present

## 2020-11-08 ENCOUNTER — Other Ambulatory Visit: Payer: Self-pay | Admitting: *Deleted

## 2020-11-08 MED ORDER — FAMOTIDINE 40 MG PO TABS: 40.0000 mg | ORAL_TABLET | Freq: Two times a day (BID) | ORAL | 2 refills | Status: DC

## 2020-11-09 DIAGNOSIS — E785 Hyperlipidemia, unspecified: Secondary | ICD-10-CM | POA: Diagnosis not present

## 2020-11-13 DIAGNOSIS — N400 Enlarged prostate without lower urinary tract symptoms: Secondary | ICD-10-CM | POA: Diagnosis not present

## 2020-11-13 DIAGNOSIS — Z1331 Encounter for screening for depression: Secondary | ICD-10-CM | POA: Diagnosis not present

## 2020-11-13 DIAGNOSIS — R82998 Other abnormal findings in urine: Secondary | ICD-10-CM | POA: Diagnosis not present

## 2020-11-13 DIAGNOSIS — F439 Reaction to severe stress, unspecified: Secondary | ICD-10-CM | POA: Diagnosis not present

## 2020-11-13 DIAGNOSIS — G8929 Other chronic pain: Secondary | ICD-10-CM | POA: Diagnosis not present

## 2020-11-13 DIAGNOSIS — E785 Hyperlipidemia, unspecified: Secondary | ICD-10-CM | POA: Diagnosis not present

## 2020-11-13 DIAGNOSIS — M1990 Unspecified osteoarthritis, unspecified site: Secondary | ICD-10-CM | POA: Diagnosis not present

## 2020-11-13 DIAGNOSIS — Z1339 Encounter for screening examination for other mental health and behavioral disorders: Secondary | ICD-10-CM | POA: Diagnosis not present

## 2020-11-13 DIAGNOSIS — I129 Hypertensive chronic kidney disease with stage 1 through stage 4 chronic kidney disease, or unspecified chronic kidney disease: Secondary | ICD-10-CM | POA: Diagnosis not present

## 2020-11-13 DIAGNOSIS — I10 Essential (primary) hypertension: Secondary | ICD-10-CM | POA: Diagnosis not present

## 2020-11-13 DIAGNOSIS — Z0000 Encounter for general adult medical examination without abnormal findings: Secondary | ICD-10-CM | POA: Diagnosis not present

## 2020-11-13 DIAGNOSIS — M810 Age-related osteoporosis without current pathological fracture: Secondary | ICD-10-CM | POA: Diagnosis not present

## 2020-11-13 DIAGNOSIS — N1831 Chronic kidney disease, stage 3a: Secondary | ICD-10-CM | POA: Diagnosis not present

## 2020-11-27 DIAGNOSIS — G4733 Obstructive sleep apnea (adult) (pediatric): Secondary | ICD-10-CM | POA: Diagnosis not present

## 2020-12-09 NOTE — Progress Notes (Signed)
Virtual Visit via Telephone Note   This visit type was conducted due to national recommendations for restrictions regarding the COVID-19 Pandemic (e.g. social distancing) in an effort to limit this patient's exposure and mitigate transmission in our community.  Due to his co-morbid illnesses, this patient is at least at moderate risk for complications without adequate follow up.  This format is felt to be most appropriate for this patient at this time.  The patient did not have access to video technology/had technical difficulties with video requiring transitioning to audio format only (telephone).  All issues noted in this document were discussed and addressed.  No physical exam could be performed with this format.  Please refer to the patient's chart for his  consent to telehealth for [facility].   Evaluation Performed:  Follow-up visit  This visit type was conducted due to national recommendations for restrictions regarding the COVID-19 Pandemic (e.g. social distancing).  This format is felt to be most appropriate for this patient at this time.  All issues noted in this document were discussed and addressed.  No physical exam was performed (except for noted visual exam findings with Video Visits).  Please refer to the patient's chart (MyChart message for video visits and phone note for telephone visits) for the patient's consent to telehealth for [facility].  Date:  12/10/2020   ID:  [name], DOB Jun 17, 1935, MRN [id number]  Patient Location:  Home  Provider location:   [place]  PCP:  Rigoberto Noel, MD  Sleep Medicine:  Fransico Him, MD Electrophysiologist:  None   Chief Complaint:  OSA, HTN  History of Present Illness:    [name] is a 86 y.o. male  with a hx of mild OSA with an AHI of 12/hr and oxygen desaturations as low as 79%. He is on CPAP at  9cm H2O. He is doing well with his CPAP device and thinks that he has gotten used to it.  He tolerates the mask  and feels the pressure is adequate except on occasion he will feel like he is not getting enough air when he firsts starts it.   Since going on CPAP he feels rested in the am and has no significant daytime sleepiness.  He does have problems with dry mouth and uses a mouthwash for dry mouth that helps.  He denies any nasal dryness or nasal congestion.  He does not think that he snores.    The patient does not have symptoms concerning for COVID-19 infection (fever, chills, cough, or new shortness of breath).   Prior CV studies:   The following studies were reviewed today:  PAP compliance download  Past Medical History:  Diagnosis Date  . Anemia   . Arthritis    osteoarthritis  back  . Celiac disease   . Chronic kidney disease   . Collagenous colitis   . Diverticulosis   . Enlarged prostate    followed by dr  Dorina Hoyer  . Esophageal dysmotility   . Esophageal dysmotility   . Esophageal stenosis   . Essential hypertension, benign 03/29/2014  . External hemorrhoids   . External hemorrhoids   . Full dentures   . GERD (gastroesophageal reflux disease)   . Hemorrhoids   . Hiatal hernia   . IBS (irritable bowel syndrome)   . Internal hemorrhoids   . OSA on CPAP 10/17/2018  . Osteoporosis   . Postoperative anemia due to acute blood loss 03/29/2014  . Schatzki's ring   .  Sleep apnea    CPAP Machine  . Ulcer    Past Surgical History:  Procedure Laterality Date  . APPENDECTOMY  1963  . EYE SURGERY Bilateral    cataract removal - implants  . HERNIA REPAIR  2002   right inguinal   . HIP SURGERY Left   . INGUINAL HERNIA REPAIR  08/19/2011   Procedure: HERNIA REPAIR  left INGUINAL ADULT;  Surgeon: Odis Hollingshead, MD;  Location: [facility];  Service: General;  Laterality: Left;  . INTRAMEDULLARY (IM) NAIL INTERTROCHANTERIC Left 03/27/2014   Procedure: INTRAMEDULLARY (IM) NAIL INTERTROCHANTRIC;  Surgeon: Mauri Pole, MD;  Location: [facility];  Service: Orthopedics;  Laterality: Left;  .  RIGHT/LEFT HEART CATH AND CORONARY ANGIOGRAPHY N/A 03/12/2017   Procedure: Right/Left Heart Cath and Coronary Angiography;  Surgeon: Jolaine Artist, MD;  Location: [facility];  Service: Cardiovascular;  Laterality: N/A;     Current Meds  Medication Sig  . acetaminophen (TYLENOL) 325 MG tablet Take 325 mg by mouth 2 (two) times daily.  Marland Kitchen albuterol (PROVENTIL HFA;VENTOLIN HFA) 108 (90 Base) MCG/ACT inhaler Inhale 2 puffs into the lungs every 6 (six) hours as needed for wheezing or shortness of breath.  Marland Kitchen alendronate (FOSAMAX) 70 MG tablet Take 70 mg by mouth once a week.   Marland Kitchen amLODipine (NORVASC) 10 MG tablet Take 10 mg by mouth daily.  Marland Kitchen azelastine (ASTELIN) 0.1 % nasal spray Place 2 sprays into both nostrils daily as needed. Use in each nostril as directed  . Cholecalciferol (DIALYVITE VITAMIN D 5000 PO) Take 2 capsules by mouth daily.  Marland Kitchen dicyclomine (BENTYL) 10 MG capsule Take 1-2 capsules (10-20 mg total) by mouth 3 (three) times daily as needed for spasms.  Marland Kitchen docusate sodium 100 MG CAPS Take 100 mg by mouth 2 (two) times daily. (Patient taking differently: Take 100 mg by mouth daily as needed (constipation).)  . doxazosin (CARDURA) 8 MG tablet Take 8 mg by mouth daily.  . famotidine (PEPCID) 40 MG tablet Take 1 tablet (40 mg total) by mouth 2 (two) times daily.  . furosemide (LASIX) 40 MG tablet Take 20 mg by mouth daily.   Marland Kitchen HYDROcodone-acetaminophen (NORCO) 5-325 MG per tablet Take 1-2 tablets by mouth every 6 (six) hours as needed.  . loratadine (CLARITIN) 10 MG tablet Take 10 mg by mouth daily.  . montelukast (SINGULAIR) 10 MG tablet Take 10 mg by mouth daily.  Vladimir Faster Glycol-Propyl Glycol (SYSTANE OP) Apply to eye as needed.  Marland Kitchen rOPINIRole (REQUIP) 1 MG tablet Take 1 mg by mouth at bedtime.   . sertraline (ZOLOFT) 50 MG tablet Take 25 mg by mouth daily.   Marland Kitchen tiZANidine (ZANAFLEX) 4 MG tablet Take 4 mg by mouth 2 (two) times daily. For pain; 1 -2 tablets depending on pain  level  . zolpidem (AMBIEN) 10 MG tablet Take 10 mg by mouth at bedtime as needed for sleep.     Allergies:   Patient has no known allergies.   Social History   Tobacco Use  . Smoking status: Former Smoker    Quit date: 08/11/1981    Years since quitting: 39.3  . Smokeless tobacco: Never Used  Vaping Use  . Vaping Use: Never used  Substance Use Topics  . Alcohol use: Yes    Comment: ocassionally  . Drug use: No     Family Hx: The patient's family history includes Brain cancer in his brother; Breast cancer in his sister; Cervical cancer in his  mother; Colon cancer in his mother; Heart attack in his father; Liver cancer in his brother; Stroke in his father; Throat cancer in his brother. There is no history of Esophageal cancer, Rectal cancer, or Stomach cancer.  ROS:   Please see the history of present illness.     All other systems reviewed and are negative.   Labs/Other Tests and Data Reviewed:    Recent Labs: 07/03/2020: BUN 16; Creatinine, Ser 1.51   Recent Lipid Panel Lab Results  Component Value Date/Time   CHOL  04/25/2009 01:55 AM    134        ATP III CLASSIFICATION:  <200     mg/dL   Desirable  200-239  mg/dL   Borderline High  >=240    mg/dL   High          TRIG 57 04/25/2009 01:55 AM   HDL 73 04/25/2009 01:55 AM   CHOLHDL 1.8 04/25/2009 01:55 AM   LDLCALC  04/25/2009 01:55 AM    50        Total Cholesterol/HDL:CHD Risk Coronary Heart Disease Risk Table                     Men   Women  1/2 Average Risk   3.4   3.3  Average Risk       5.0   4.4  2 X Average Risk   9.6   7.1  3 X Average Risk  23.4   11.0        Use the calculated Patient Ratio above and the CHD Risk Table to determine the patient's CHD Risk.        ATP III CLASSIFICATION (LDL):  <100     mg/dL   Optimal  100-129  mg/dL   Near or Above                    Optimal  130-159  mg/dL   Borderline  160-189  mg/dL   High  >190     mg/dL   Very High    Wt Readings from Last 3  Encounters:  12/10/20 140 lb (63.5 kg)  09/13/20 143 lb (64.9 kg)  07/03/20 140 lb (63.5 kg)     Objective:    Vital Signs:  BP (!) 141/71   Pulse 80   Ht 5' 6"  (1.676 m)   Wt 140 lb (63.5 kg)   BMI 22.60 kg/m     ASSESSMENT & PLAN:    1. OSA - The patient is tolerating PAP therapy well without any problems. The PAP download was reviewed today and showed an AHI of 2/hr on 9 cm H2O with 57% compliance in using more than 4 hours nightly.  The patient has been using and benefiting from PAP use and will continue to benefit from therapy.  -I have encouraged her to be more compliant with her device  2.  HTN  -BP is well controlled on exam today -continue amlodipine 30m daily, Doxazosin 867mdaily  COVID-19 Education: The signs and symptoms of COVID-19 were discussed with the patient and how to seek care for testing (follow up with PCP or arrange E-visit).  The importance of social distancing was discussed today.  Patient Risk:   After full review of this patient's clinical status, I feel that they are at least moderate risk at this time.  Time:   Today, I have spent 20 minutes on telemedicine discussing medical problems including OSA, HTN  and reviewing patient's chart including PAP compliance download from DME on [employer].  Medication Adjustments/Labs and Tests Ordered: Current medicines are reviewed at length with the patient today.  Concerns regarding medicines are outlined above.  Tests Ordered: No orders of the defined types were placed in this encounter.  Medication Changes: No orders of the defined types were placed in this encounter.   Disposition:  Follow up in 1 year(s)  Signed, Fransico Him, MD  12/10/2020 10:00 AM    [facility]

## 2020-12-10 ENCOUNTER — Other Ambulatory Visit: Payer: Self-pay

## 2020-12-10 ENCOUNTER — Telehealth (INDEPENDENT_AMBULATORY_CARE_PROVIDER_SITE_OTHER): Payer: Medicare HMO | Admitting: Cardiology

## 2020-12-10 ENCOUNTER — Encounter: Payer: Self-pay | Admitting: Cardiology

## 2020-12-10 VITALS — BP 141/71 | HR 80 | Ht 66.0 in | Wt 140.0 lb

## 2020-12-10 DIAGNOSIS — I10 Essential (primary) hypertension: Secondary | ICD-10-CM | POA: Diagnosis not present

## 2020-12-10 DIAGNOSIS — G4733 Obstructive sleep apnea (adult) (pediatric): Secondary | ICD-10-CM

## 2020-12-10 DIAGNOSIS — Z9989 Dependence on other enabling machines and devices: Secondary | ICD-10-CM

## 2020-12-10 NOTE — Patient Instructions (Signed)

## 2020-12-13 DIAGNOSIS — M810 Age-related osteoporosis without current pathological fracture: Secondary | ICD-10-CM | POA: Diagnosis not present

## 2021-03-18 DIAGNOSIS — G4733 Obstructive sleep apnea (adult) (pediatric): Secondary | ICD-10-CM | POA: Diagnosis not present

## 2021-04-02 DIAGNOSIS — H04213 Epiphora due to excess lacrimation, bilateral lacrimal glands: Secondary | ICD-10-CM | POA: Diagnosis not present

## 2021-04-02 DIAGNOSIS — H524 Presbyopia: Secondary | ICD-10-CM | POA: Diagnosis not present

## 2021-04-02 DIAGNOSIS — H52203 Unspecified astigmatism, bilateral: Secondary | ICD-10-CM | POA: Diagnosis not present

## 2021-04-02 DIAGNOSIS — H43813 Vitreous degeneration, bilateral: Secondary | ICD-10-CM | POA: Diagnosis not present

## 2021-04-23 DIAGNOSIS — D1801 Hemangioma of skin and subcutaneous tissue: Secondary | ICD-10-CM | POA: Diagnosis not present

## 2021-04-23 DIAGNOSIS — L821 Other seborrheic keratosis: Secondary | ICD-10-CM | POA: Diagnosis not present

## 2021-04-23 DIAGNOSIS — Z85828 Personal history of other malignant neoplasm of skin: Secondary | ICD-10-CM | POA: Diagnosis not present

## 2021-04-23 DIAGNOSIS — L570 Actinic keratosis: Secondary | ICD-10-CM | POA: Diagnosis not present

## 2021-05-14 DIAGNOSIS — G8929 Other chronic pain: Secondary | ICD-10-CM | POA: Diagnosis not present

## 2021-05-14 DIAGNOSIS — N1831 Chronic kidney disease, stage 3a: Secondary | ICD-10-CM | POA: Diagnosis not present

## 2021-05-14 DIAGNOSIS — E785 Hyperlipidemia, unspecified: Secondary | ICD-10-CM | POA: Diagnosis not present

## 2021-05-14 DIAGNOSIS — K900 Celiac disease: Secondary | ICD-10-CM | POA: Diagnosis not present

## 2021-05-14 DIAGNOSIS — M1990 Unspecified osteoarthritis, unspecified site: Secondary | ICD-10-CM | POA: Diagnosis not present

## 2021-05-14 DIAGNOSIS — N400 Enlarged prostate without lower urinary tract symptoms: Secondary | ICD-10-CM | POA: Diagnosis not present

## 2021-05-14 DIAGNOSIS — I129 Hypertensive chronic kidney disease with stage 1 through stage 4 chronic kidney disease, or unspecified chronic kidney disease: Secondary | ICD-10-CM | POA: Diagnosis not present

## 2021-05-14 DIAGNOSIS — K219 Gastro-esophageal reflux disease without esophagitis: Secondary | ICD-10-CM | POA: Diagnosis not present

## 2021-05-14 DIAGNOSIS — M810 Age-related osteoporosis without current pathological fracture: Secondary | ICD-10-CM | POA: Diagnosis not present

## 2021-06-03 NOTE — Progress Notes (Signed)
Cardiology Office Note   Date:  06/07/2021   ID:  [name], [name] 12-17-1934, MRN [id number]  PCP:  Charles Noel, MD  Cardiologist:   Charles Wilms Martin[place], MD   Chief Complaint  Patient presents with   Leg Pain      History of Present Illness: [name] is a 86 y.o. male who presents for evaluation of leg pain/possible PAD. I care for his wife [name] and he is Dr [name] father in law. He has a history of HTN and OSA on CPAP. Prior Myoview in 2010 was normal. He had a cardiac cath in 2018 showing normal coronaries and normal right heart hemodynamics. Echo in 2018 showed mild MR-otherwise normal.   He reports problems with restless legs. Worse at night but also during the day. Requip has helped some. He is now complaining of bilateral leg pain in calves and top of his feet. Pain is worse with walking. Has some chronic swelling.     Past Medical History:  Diagnosis Date   Anemia    Arthritis    osteoarthritis  back   Celiac disease    Chronic kidney disease    Collagenous colitis    Diverticulosis    Enlarged prostate    followed by dr  Dorina Hoyer   Esophageal dysmotility    Esophageal dysmotility    Esophageal stenosis    Essential hypertension, benign 03/29/2014   External hemorrhoids    External hemorrhoids    Full dentures    GERD (gastroesophageal reflux disease)    Hemorrhoids    Hiatal hernia    IBS (irritable bowel syndrome)    Internal hemorrhoids    OSA on CPAP 10/17/2018   Osteoporosis    Postoperative anemia due to acute blood loss 03/29/2014   Schatzki's ring    Sleep apnea    CPAP Machine   Ulcer     Past Surgical History:  Procedure Laterality Date   APPENDECTOMY  1963   EYE SURGERY Bilateral    cataract removal - implants   HERNIA REPAIR  2002   right inguinal    HIP SURGERY Left    INGUINAL HERNIA REPAIR  08/19/2011   Procedure: HERNIA REPAIR  left INGUINAL ADULT;  Surgeon: Odis Hollingshead, MD;  Location: [facility];  Service: General;   Laterality: Left;   INTRAMEDULLARY (IM) NAIL INTERTROCHANTERIC Left 03/27/2014   Procedure: INTRAMEDULLARY (IM) NAIL INTERTROCHANTRIC;  Surgeon: Mauri Pole, MD;  Location: [facility];  Service: Orthopedics;  Laterality: Left;   RIGHT/LEFT HEART CATH AND CORONARY ANGIOGRAPHY N/A 03/12/2017   Procedure: Right/Left Heart Cath and Coronary Angiography;  Surgeon: Jolaine Artist, MD;  Location: [facility];  Service: Cardiovascular;  Laterality: N/A;     Current Outpatient Medications  Medication Sig Dispense Refill   acetaminophen (TYLENOL) 325 MG tablet Take 325 mg by mouth 2 (two) times daily.     albuterol (PROVENTIL HFA;VENTOLIN HFA) 108 (90 Base) MCG/ACT inhaler Inhale 2 puffs into the lungs every 6 (six) hours as needed for wheezing or shortness of breath.     alendronate (FOSAMAX) 70 MG tablet Take 70 mg by mouth once a week.      amLODipine (NORVASC) 10 MG tablet Take 10 mg by mouth daily.     Cholecalciferol (DIALYVITE VITAMIN D 5000 PO) Take 2 capsules by mouth daily.     diclofenac Sodium (VOLTAREN) 1 % GEL Apply topically as needed.     docusate sodium 100 MG  CAPS Take 100 mg by mouth 2 (two) times daily. (Patient taking differently: Take 100 mg by mouth daily as needed (constipation).) 10 capsule 0   doxazosin (CARDURA) 8 MG tablet Take 8 mg by mouth daily.     famotidine (PEPCID) 40 MG tablet Take 1 tablet (40 mg total) by mouth 2 (two) times daily. 180 tablet 2   furosemide (LASIX) 40 MG tablet Take 20 mg by mouth daily.      HYDROcodone-acetaminophen (NORCO) 5-325 MG per tablet Take 1-2 tablets by mouth every 6 (six) hours as needed. 90 tablet 0   loratadine (CLARITIN) 10 MG tablet Take 10 mg by mouth daily.     montelukast (SINGULAIR) 10 MG tablet Take 10 mg by mouth daily.     Polyethyl Glycol-Propyl Glycol (SYSTANE OP) Apply to eye as needed.     rOPINIRole (REQUIP) 1 MG tablet Take 1 mg by mouth at bedtime.      sertraline (ZOLOFT) 50 MG tablet Take 25 mg by mouth  daily.      tiZANidine (ZANAFLEX) 4 MG tablet Take 4 mg by mouth 2 (two) times daily. For pain; 1 -2 tablets depending on pain level     zolpidem (AMBIEN) 10 MG tablet Take 10 mg by mouth at bedtime as needed for sleep.     No current facility-administered medications for this visit.    Allergies:   Patient has no known allergies.    Social History:  The patient  reports that he quit smoking about 39 years ago. His smoking use included cigarettes. He has never used smokeless tobacco. He reports current alcohol use. He reports that he does not use drugs.   Family History:  The patient's family history includes Brain cancer in his brother; Breast cancer in his sister; Cervical cancer in his mother; Colon cancer in his mother; Heart attack in his father; Liver cancer in his brother; Stroke in his father; Throat cancer in his brother.    ROS:  Please see the history of present illness.   Otherwise, review of systems are positive for none.   All other systems are reviewed and negative.    PHYSICAL EXAM: VS:  BP 126/60 (BP Location: Left Arm, Patient Position: Sitting, Cuff Size: Normal)   Pulse 66   Ht 5' 7"  (1.702 m)   Wt 139 lb 3.2 oz (63.1 kg)   SpO2 95%   BMI 21.80 kg/m  , BMI Body mass index is 21.8 kg/m. GEN: Well nourished, well developed, in no acute distress HEENT: normal Neck: no JVD, carotid bruits, or masses Cardiac: RRR; no murmurs, rubs, or gallops,no edema  Respiratory:  clear to auscultation bilaterally, normal work of breathing GI: soft, nontender, nondistended, + BS MS: no deformity or atrophy. Pedal pulse are good. He does have a right femoral bruit.  Skin: warm and dry, no rash Neuro:  Strength and sensation are intact Psych: euthymic mood, full affect   EKG:  EKG is ordered today. The ekg ordered today demonstrates NSR rate 66. Normal. I have personally reviewed and interpreted this study.    Recent Labs: 07/03/2020: BUN 16; Creatinine, Ser 1.51   Dated  11/06/20: cholesterol 158, triglycerides 119, HDL 49, LDL 85, creatinine 1.5. otherwise CMET normal. TSH normal Lipid Panel    Component Value Date/Time   CHOL  04/25/2009 0155    134        ATP III CLASSIFICATION:  <200     mg/dL   Desirable  200-239  mg/dL  Borderline High  >=240    mg/dL   High          TRIG 57 04/25/2009 0155   HDL 73 04/25/2009 0155   CHOLHDL 1.8 04/25/2009 0155   VLDL 11 04/25/2009 0155   LDLCALC  04/25/2009 0155    50        Total Cholesterol/HDL:CHD Risk Coronary Heart Disease Risk Table                     Men   Women  1/2 Average Risk   3.4   3.3  Average Risk       5.0   4.4  2 X Average Risk   9.6   7.1  3 X Average Risk  23.4   11.0        Use the calculated Patient Ratio above and the CHD Risk Table to determine the patient's CHD Risk.        ATP III CLASSIFICATION (LDL):  <100     mg/dL   Optimal  100-129  mg/dL   Near or Above                    Optimal  130-159  mg/dL   Borderline  160-189  mg/dL   High  >190     mg/dL   Very High      Wt Readings from Last 3 Encounters:  06/07/21 139 lb 3.2 oz (63.1 kg)  12/10/20 140 lb (63.5 kg)  09/13/20 143 lb (64.9 kg)      Other studies Reviewed: Additional studies/ records that were reviewed today include:   Cardiac cath 03/12/17:  Right/Left Heart Cath and Coronary Angiography   Conclusion    The left ventricular ejection fraction is 55-65% by visual estimate.   Findings:   RA = 2 RV = 24/1 PA = 22/3 (13) PCW = 3 Fick cardiac output/index = 5.9/3.4 PVR = 1.7 WU Ao sat = 97% PA sat = 70%, 74%   Assessment: 1. Normal hemodynamics 2. EF 60% 3. Normal coronary arteries   Plan/Discussion:   Normal cath. Continue medical management.    Glori Bickers, MD  11:07 AM   Echo 03/19/17: Study Conclusions   - Left ventricle: The cavity size was normal. Systolic function was    normal. The estimated ejection fraction was in the range of 55%    to 60%. Wall motion was  normal; there were no regional wall    motion abnormalities.  - Mitral valve: There was mild regurgitation.  - Right atrium: Cannot r/o RA cor triatriatom should not be of any    clinical significance.  - Atrial septum: No defect or patent foramen ovale was identified.  - Pulmonary arteries: PA peak pressure: 34 mm Hg (S).   ASSESSMENT AND PLAN:  1.  Leg pain. Suspect more related to neuropathy and/or restless leg syndrome. He does have a femoral bruit on the right. Will arrange for LE arterial dopplers to make sure he doesn't have significant PAD.  2. HTN 3. OSA on CPAP   Current medicines are reviewed at length with the patient today.  The patient does not have concerns regarding medicines.  The following changes have been made:  no change  Labs/ tests ordered today include:   Orders Placed This Encounter  Procedures   EKG 12-Lead   VAS [place] LOWER EXTREMITY ARTERIAL DUPLEX      Disposition:   FU TBD  Signed, Parminder Cupples Martin[place],  MD  06/07/2021 2:52 PM    [facility] [address] [place], [place], [address] Phone [phone], Fax [phone]

## 2021-06-07 ENCOUNTER — Ambulatory Visit: Payer: Medicare HMO | Admitting: Cardiology

## 2021-06-07 ENCOUNTER — Other Ambulatory Visit: Payer: Self-pay | Admitting: Cardiology

## 2021-06-07 ENCOUNTER — Encounter: Payer: Self-pay | Admitting: Cardiology

## 2021-06-07 ENCOUNTER — Other Ambulatory Visit: Payer: Self-pay

## 2021-06-07 VITALS — BP 126/60 | HR 66 | Ht 67.0 in | Wt 139.2 lb

## 2021-06-07 DIAGNOSIS — M79604 Pain in right leg: Secondary | ICD-10-CM

## 2021-06-07 DIAGNOSIS — M79605 Pain in left leg: Secondary | ICD-10-CM

## 2021-06-07 DIAGNOSIS — G2581 Restless legs syndrome: Secondary | ICD-10-CM

## 2021-06-07 DIAGNOSIS — I739 Peripheral vascular disease, unspecified: Secondary | ICD-10-CM

## 2021-06-07 NOTE — Patient Instructions (Signed)
Medication Instructions:   No change   Lab Work:  None ordered   Testing/Procedures:  Schedule Lower ext arterial dopplers   Follow-Up: At [facility], you and your health needs are our priority.  As part of our continuing mission to provide you with exceptional heart care, we have created designated Provider Care Teams.  These Care Teams include your primary Cardiologist (physician) and Advanced Practice Providers (APPs -  Physician Assistants and Nurse Practitioners) who all work together to provide you with the care you need, when you need it.  We recommend signing up for the patient portal called "MyChart".  Sign up information is provided on this After Visit Summary.  MyChart is used to connect with patients for Virtual Visits (Telemedicine).  Patients are able to view lab/test results, encounter notes, upcoming appointments, etc.  Non-urgent messages can be sent to your provider as well.   To learn more about what you can do with MyChart, go to [email or web address].     Your next appointment:  To be determined after test   The format for your next appointment: Office   Provider:  Dr.Jordan

## 2021-06-11 ENCOUNTER — Other Ambulatory Visit: Payer: Self-pay

## 2021-06-11 ENCOUNTER — Ambulatory Visit (HOSPITAL_COMMUNITY)
Admission: RE | Admit: 2021-06-11 | Discharge: 2021-06-11 | Disposition: A | Discharge status: Home / Self Care | Payer: Medicare HMO | Source: Ambulatory Visit | Attending: Cardiology | Admitting: Cardiology

## 2021-06-11 DIAGNOSIS — M79605 Pain in left leg: Secondary | ICD-10-CM | POA: Insufficient documentation

## 2021-06-11 DIAGNOSIS — M79604 Pain in right leg: Secondary | ICD-10-CM | POA: Insufficient documentation

## 2021-06-11 DIAGNOSIS — G2581 Restless legs syndrome: Secondary | ICD-10-CM | POA: Insufficient documentation

## 2021-06-11 DIAGNOSIS — I739 Peripheral vascular disease, unspecified: Secondary | ICD-10-CM | POA: Insufficient documentation

## 2021-06-21 DIAGNOSIS — G4733 Obstructive sleep apnea (adult) (pediatric): Secondary | ICD-10-CM | POA: Diagnosis not present

## 2021-09-25 ENCOUNTER — Telehealth: Payer: Self-pay

## 2021-09-25 NOTE — Telephone Encounter (Signed)
Called patient to reschdule appointment

## 2021-09-25 NOTE — Telephone Encounter (Signed)
Called patient rescheduled appointment with [name] for 12/16/ 8:50 Am

## 2021-09-26 ENCOUNTER — Ambulatory Visit: Payer: Medicare HMO | Admitting: Physician Assistant

## 2021-09-26 NOTE — Progress Notes (Signed)
Cardiology Office Note   Date:  09/27/2021   ID:  [name], DOB 04-Dec-1934, MRN [id number]  PCP:  Charles Noel, MD  Cardiologist:  Dr. Martinique  CC: Follow-up chronic restless legs    History of Present Illness: [name] is a 86 y.o. male who presents for ongoing assessment and management of chronic leg pain with possible PAD.  Dr. Martinique also cares for his wife [name], and the patient is Dr. Laurene Prince father-in-law.  He has a history of hypertension, cardiac catheterization in 2018 showing normal coronaries and normal right heart hemodynamics.  Echocardiogram in 2018 showed mild MR otherwise normal, also history of OSA on CPAP.  Was last seen in the office by Dr. Martinique on 06/07/2021 with complaints of restless legs worse at night but also does occur during the day.  Requip has been helpful but now complaining of bilateral leg pain in his calves and the top of his feet worse with walking with some lower extremity edema.  Lower extremity Dopplers were ordered for assessment.  These were completed on 06/11/2021 with results revealing good blood flow to his legs with no evidence of stenosis or PAD.   He comes today without any new complaints.  He continues to have burning and some tingling in his lower extremities especially his feet.  He also has a lot of restless legs symptoms, especially at nighttime.  He has been given prescription for Requip which he can take 3 times a day [place] because it makes him sleepy.  Otherwise he continues to be active and does not have complaints of leg discomfort during the daytime.  Past Medical History:  Diagnosis Date   Anemia    Arthritis    osteoarthritis  back   Celiac disease    Chronic kidney disease    Collagenous colitis    Diverticulosis    Enlarged prostate    followed by dr  Dorina Hoyer   Esophageal dysmotility    Esophageal dysmotility    Esophageal stenosis    Essential hypertension, benign 03/29/2014   External hemorrhoids     External hemorrhoids    Full dentures    GERD (gastroesophageal reflux disease)    Hemorrhoids    Hiatal hernia    IBS (irritable bowel syndrome)    Internal hemorrhoids    OSA on CPAP 10/17/2018   Osteoporosis    Postoperative anemia due to acute blood loss 03/29/2014   Schatzki's ring    Sleep apnea    CPAP Machine   Ulcer     Past Surgical History:  Procedure Laterality Date   APPENDECTOMY  1963   EYE SURGERY Bilateral    cataract removal - implants   HERNIA REPAIR  2002   right inguinal    HIP SURGERY Left    INGUINAL HERNIA REPAIR  08/19/2011   Procedure: HERNIA REPAIR  left INGUINAL ADULT;  Surgeon: Odis Hollingshead, MD;  Location: [facility];  Service: General;  Laterality: Left;   INTRAMEDULLARY (IM) NAIL INTERTROCHANTERIC Left 03/27/2014   Procedure: INTRAMEDULLARY (IM) NAIL INTERTROCHANTRIC;  Surgeon: Mauri Pole, MD;  Location: [facility];  Service: Orthopedics;  Laterality: Left;   RIGHT/LEFT HEART CATH AND CORONARY ANGIOGRAPHY N/A 03/12/2017   Procedure: Right/Left Heart Cath and Coronary Angiography;  Surgeon: Jolaine Artist, MD;  Location: [place] [facility];  Service: Cardiovascular;  Laterality: N/A;     Current Outpatient Medications  Medication Sig Dispense Refill   acetaminophen (TYLENOL) 325 MG tablet Take 325  mg by mouth 2 (two) times daily.     albuterol (PROVENTIL HFA;VENTOLIN HFA) 108 (90 Base) MCG/ACT inhaler Inhale 2 puffs into the lungs every 6 (six) hours as needed for wheezing or shortness of breath.     alendronate (FOSAMAX) 70 MG tablet Take 70 mg by mouth once a week.      amLODipine (NORVASC) 10 MG tablet Take 10 mg by mouth daily.     Cholecalciferol (DIALYVITE VITAMIN D 5000 PO) Take 2 capsules by mouth daily.     diclofenac Sodium (VOLTAREN) 1 % GEL Apply topically as needed.     docusate sodium 100 MG CAPS Take 100 mg by mouth 2 (two) times daily. (Patient taking differently: Take 100 mg by mouth daily as needed (constipation).) 10 capsule 0    doxazosin (CARDURA) 8 MG tablet Take 8 mg by mouth daily.     famotidine (PEPCID) 40 MG tablet Take 1 tablet (40 mg total) by mouth 2 (two) times daily. 180 tablet 2   furosemide (LASIX) 40 MG tablet Take 20 mg by mouth daily.      HYDROcodone-acetaminophen (NORCO) 5-325 MG per tablet Take 1-2 tablets by mouth every 6 (six) hours as needed. 90 tablet 0   loratadine (CLARITIN) 10 MG tablet Take 10 mg by mouth daily.     Magnesium 200 MG CHEW Chew 200 mg by mouth daily. 30 tablet 11   montelukast (SINGULAIR) 10 MG tablet Take 10 mg by mouth daily.     Polyethyl Glycol-Propyl Glycol (SYSTANE OP) Apply to eye as needed.     rOPINIRole (REQUIP) 1 MG tablet Take 1 mg by mouth at bedtime.      sertraline (ZOLOFT) 50 MG tablet Take 25 mg by mouth daily.      tiZANidine (ZANAFLEX) 4 MG tablet Take 4 mg by mouth 2 (two) times daily. For pain; 1 -2 tablets depending on pain level     zolpidem (AMBIEN) 10 MG tablet Take 10 mg by mouth at bedtime as needed for sleep.     No current facility-administered medications for this visit.    Allergies:   Patient has no known allergies.    Social History:  The patient  reports that he quit smoking about 40 years ago. His smoking use included cigarettes. He has never used smokeless tobacco. He reports current alcohol use. He reports that he does not use drugs.   Family History:  The patient's family history includes Brain cancer in his brother; Breast cancer in his sister; Cervical cancer in his mother; Colon cancer in his mother; Heart attack in his father; Liver cancer in his brother; Stroke in his father; Throat cancer in his brother.    ROS: All other systems are reviewed and negative. Unless otherwise mentioned in H&P    PHYSICAL EXAM: VS:  BP 122/60    Pulse 82    Ht 5\' 7"  (1.702 m)    Wt 143 lb 6.4 oz (65 kg)    SpO2 96%    BMI 22.46 kg/m  , BMI Body mass index is 22.46 kg/m. GEN: Well nourished, well developed, in no acute distress HEENT:  normal Neck: no JVD, carotid bruits, or masses.  Cardiac: RRR; no murmurs, rubs, or gallops, minimal dependent edema  Respiratory:  Clear to auscultation bilaterally, normal work of breathing GI: soft, nontender, nondistended, + BS MS: no deformity or atrophy Skin: warm and dry, no rash Neuro:  Strength and sensation are intact Psych: euthymic mood, full affect  EKG:  EKG is not ordered today.    Recent Labs: No results found for requested labs within last 8760 hours.    Lipid Panel    Component Value Date/Time   CHOL  04/25/2009 0155    134        ATP III CLASSIFICATION:  <200     mg/dL   Desirable  200-239  mg/dL   Borderline High  >=240    mg/dL   High          TRIG 57 04/25/2009 0155   HDL 73 04/25/2009 0155   CHOLHDL 1.8 04/25/2009 0155   VLDL 11 04/25/2009 0155   LDLCALC  04/25/2009 0155    50        Total Cholesterol/HDL:CHD Risk Coronary Heart Disease Risk Table                     Men   Women  1/2 Average Risk   3.4   3.3  Average Risk       5.0   4.4  2 X Average Risk   9.6   7.1  3 X Average Risk  23.4   11.0        Use the calculated Patient Ratio above and the CHD Risk Table to determine the patient's CHD Risk.        ATP III CLASSIFICATION (LDL):  <100     mg/dL   Optimal  100-129  mg/dL   Near or Above                    Optimal  130-159  mg/dL   Borderline  160-189  mg/dL   High  >190     mg/dL   Very High      Wt Readings from Last 3 Encounters:  09/27/21 143 lb 6.4 oz (65 kg)  06/07/21 139 lb 3.2 oz (63.1 kg)  12/10/20 140 lb (63.5 kg)      Other studies Reviewed: Doppler Ultrasound of LE 06/11/2021 Right: Resting right ankle-brachial index indicates noncompressible right  lower extremity arteries. The right toe-brachial index is normal. Brisk,  multiphasic waveforms from the groin to ankle consistent with no evidence  of significant peripheral  arterial disease.   Left: Resting left ankle-brachial index is within normal range.  No  evidence of significant left lower extremity arterial disease. The left  toe-brachial index is mildly abnormal. Brisk, multiphasic waveforms from  the groin to ankle consistent with no  evidence of significant peripheral arterial disease Cardiac cath 03/12/17:  Right/Left Heart Cath and Coronary Angiography    Conclusion     The left ventricular ejection fraction is 55-65% by visual estimate.   Findings:   RA = 2 RV = 24/1 PA = 22/3 (13) PCW = 3 Fick cardiac output/index = 5.9/3.4 PVR = 1.7 WU Ao sat = 97% PA sat = 70%, 74%   Assessment: 1. Normal hemodynamics 2. EF 60% 3. Normal coronary arteries  Echo 03/19/17: Study Conclusions   - Left ventricle: The cavity size was normal. Systolic function was    normal. The estimated ejection fraction was in the range of 55%    to 60%. Wall motion was normal; there were no regional wall    motion abnormalities.  - Mitral valve: There was mild regurgitation.  - Right atrium: Cannot r/o RA cor triatriatom should not be of any    clinical significance.  - Atrial septum: No defect or patent foramen  ovale was identified.  - Pulmonary arteries: PA peak pressure: 34 mm Hg (  ASSESSMENT AND PLAN:  1.  Lower extremity neurologic: Associated restless legs.  He is on ropinirole which he can take at night and during the day as needed.  He does not take it during the day because he states it makes him feel sleepy.  I have suggested maybe taking a half a tablet as needed during the day as she does not feel it is often until he rests at night.  Of also suggested that he start magnesium 200 mg today but have cautioned him concerning the loose stool which can help with magnesium.  I reviewed his lower extremity Doppler studies with him all of which were fine without any evidence of stenosis or blockages.  We will follow-up with Dr. Martinique in 1 year.  2.  OSA: On CPAP compliant followed by PCP.  3.  Hypertension: Very well controlled, he remains  on amlodipine 10 mg daily which can cause some dependent edema which has been explained to him.  He verbalizes understanding.   Current medicines are reviewed at length with the patient today.  I have spent 25 min's  dedicated to the care of this patient on the date of this encounter to include pre-visit review of records, assessment, management and diagnostic testing,with shared decision making.  Labs/ tests ordered today include: None done by PCP scheduled in January.   Phill Myron. West Pugh, ANP, AACC   09/27/2021 9:47 AM    [facility] [place] [address] [phone] Fax [phone]  Notice: This dictation was prepared with Dragon dictation along with smaller phrase technology. Any transcriptional errors that result from this process are unintentional and may not be corrected upon review.

## 2021-09-27 ENCOUNTER — Other Ambulatory Visit: Payer: Self-pay

## 2021-09-27 ENCOUNTER — Encounter: Payer: Self-pay | Admitting: Adult Health

## 2021-09-27 ENCOUNTER — Ambulatory Visit: Payer: Medicare HMO | Admitting: Adult Health

## 2021-09-27 VITALS — BP 122/60 | HR 82 | Ht 67.0 in | Wt 143.4 lb

## 2021-09-27 DIAGNOSIS — M79605 Pain in left leg: Secondary | ICD-10-CM | POA: Diagnosis not present

## 2021-09-27 DIAGNOSIS — G4733 Obstructive sleep apnea (adult) (pediatric): Secondary | ICD-10-CM | POA: Diagnosis not present

## 2021-09-27 DIAGNOSIS — G2581 Restless legs syndrome: Secondary | ICD-10-CM

## 2021-09-27 DIAGNOSIS — I10 Essential (primary) hypertension: Secondary | ICD-10-CM | POA: Diagnosis not present

## 2021-09-27 DIAGNOSIS — Z9989 Dependence on other enabling machines and devices: Secondary | ICD-10-CM

## 2021-09-27 DIAGNOSIS — M79604 Pain in right leg: Secondary | ICD-10-CM

## 2021-09-27 MED ORDER — MAGNESIUM 200 MG PO CHEW: 200.0000 mg | CHEWABLE_TABLET | Freq: Every day | ORAL | 11 refills | Status: DC

## 2021-09-27 NOTE — Patient Instructions (Signed)
Medication Instructions:  Start Magnesium 200 mg ( 1 Tablet Daily). *If you need a refill on your cardiac medications before your next appointment, please call your pharmacy*   Lab Work: No Labs If you have labs (blood work) drawn today and your tests are completely normal, you will receive your results only by: [place] (if you have MyChart) OR A paper copy in the mail If you have any lab test that is abnormal or we need to change your treatment, we will call you to review the results.   Testing/Procedures: No Testing   Follow-Up: At [facility], you and your health needs are our priority.  As part of our continuing mission to provide you with exceptional heart care, we have created designated Provider Care Teams.  These Care Teams include your primary Cardiologist (physician) and Advanced Practice Providers (APPs -  Physician Assistants and Nurse Practitioners) who all work together to provide you with the care you need, when you need it.  We recommend signing up for the patient portal called "MyChart".  Sign up information is provided on this After Visit Summary.  MyChart is used to connect with patients for Virtual Visits (Telemedicine).  Patients are able to view lab/test results, encounter notes, upcoming appointments, etc.  Non-urgent messages can be sent to your provider as well.   To learn more about what you can do with MyChart, go to [email or web address].    Your next appointment:   1 year(s)  The format for your next appointment:   In Person  Provider:   Peter Martinique, MD

## 2021-10-02 ENCOUNTER — Other Ambulatory Visit: Payer: Self-pay

## 2021-10-02 MED ORDER — MAGNESIUM 200 MG PO CHEW: 200.0000 mg | CHEWABLE_TABLET | Freq: Every day | ORAL | 11 refills | Status: DC

## 2021-10-02 NOTE — Telephone Encounter (Signed)
Pt's medication was resent to preferred pharmacy as requested. Confirmation received.

## 2021-10-04 ENCOUNTER — Other Ambulatory Visit: Payer: Self-pay

## 2021-10-04 ENCOUNTER — Emergency Department (HOSPITAL_BASED_OUTPATIENT_CLINIC_OR_DEPARTMENT_OTHER)
Admission: EM | Admit: 2021-10-04 | Discharge: 2021-10-04 | Disposition: A | Discharge status: Home / Self Care | Payer: PRIVATE HEALTH INSURANCE | Attending: Emergency Medicine | Admitting: Emergency Medicine

## 2021-10-04 ENCOUNTER — Encounter (HOSPITAL_BASED_OUTPATIENT_CLINIC_OR_DEPARTMENT_OTHER): Payer: Self-pay

## 2021-10-04 DIAGNOSIS — I10 Essential (primary) hypertension: Secondary | ICD-10-CM | POA: Insufficient documentation

## 2021-10-04 DIAGNOSIS — Z87891 Personal history of nicotine dependence: Secondary | ICD-10-CM | POA: Diagnosis not present

## 2021-10-04 DIAGNOSIS — Z79899 Other long term (current) drug therapy: Secondary | ICD-10-CM | POA: Insufficient documentation

## 2021-10-04 DIAGNOSIS — U071 COVID-19: Secondary | ICD-10-CM | POA: Insufficient documentation

## 2021-10-04 DIAGNOSIS — R059 Cough, unspecified: Secondary | ICD-10-CM | POA: Diagnosis present

## 2021-10-04 LAB — RESP PANEL BY RT-PCR (FLU A&B, COVID) ARPGX2
Influenza A by PCR: NEGATIVE
Influenza B by PCR: NEGATIVE
SARS Coronavirus 2 by RT PCR: POSITIVE — AB

## 2021-10-04 MED ORDER — NIRMATRELVIR/RITONAVIR (PAXLOVID) TABLET (RENAL DOSING): 2.0000 | ORAL_TABLET | Freq: Two times a day (BID) | ORAL | 0 refills | Status: AC

## 2021-10-04 MED ORDER — SODIUM CHLORIDE 0.9 % IV BOLUS: 1000.0000 mL | Freq: Once | INTRAVENOUS | Status: DC

## 2021-10-04 NOTE — Discharge Instructions (Addendum)
You were seen in the emergency department today for viral upper respiratory symptoms.  You were diagnosed with COVID-19.  While you are here we offered you fluids which you declined at this time.  Please continue to push fluids by mouth.  I have prescribed you antiretroviral, Paxlovid, which is used for coronavirus.  Please take this as prescribed.  Please return to the emergency department for worsening shortness of breath, chest pain or fevers that are unresponsive to Tylenol and Motrin.  Please continue to take over-the-counter cough and cold medications as needed for your symptoms.

## 2021-10-04 NOTE — ED Provider Notes (Signed)
[facility] EMERGENCY DEPT Provider Note   CSN: [id number] Arrival date & time: 10/04/21  1749     History Chief Complaint  Patient presents with   URI    [name] is a 86 y.o. male.  With past medical history of chronic kidney disease, GERD, obstructive sleep apnea on CPAP at night who presents to the emergency department with cough.  Patient states that symptoms began yesterday with cough.  He states that he did have a slight scratchy and sore throat yesterday.  He also noted that he was dizzy this morning but he contributed that to the cough medication he had been taking.  He endorses fever of 103.3 today.  He states he took Tylenol with relief of symptoms.  Endorses nausea without vomiting.  He is tolerating liquid intake at home, no solid foods today.  He endorses anorexia.  Denies chest pain, shortness of breath, sick contacts, recent travel, diarrhea.   URI Presenting symptoms: congestion, cough, fatigue, fever and sore throat   Associated symptoms: no headaches and no myalgias       Past Medical History:  Diagnosis Date   Anemia    Arthritis    osteoarthritis  back   Celiac disease    Chronic kidney disease    Collagenous colitis    Diverticulosis    Enlarged prostate    followed by dr  Dorina Hoyer   Esophageal dysmotility    Esophageal dysmotility    Esophageal stenosis    Essential hypertension, benign 03/29/2014   External hemorrhoids    External hemorrhoids    Full dentures    GERD (gastroesophageal reflux disease)    Hemorrhoids    Hiatal hernia    IBS (irritable bowel syndrome)    Internal hemorrhoids    OSA on CPAP 10/17/2018   Osteoporosis    Postoperative anemia due to acute blood loss 03/29/2014   Schatzki's ring    Sleep apnea    CPAP Machine   Ulcer     Patient Active Problem List   Diagnosis Date Noted   Diarrhea 05/19/2019   OSA on CPAP 10/17/2018   Essential hypertension, benign 03/29/2014   BPH (benign prostatic  hyperplasia) 03/29/2014   Postoperative anemia due to acute blood loss 03/29/2014   Fracture, intertrochanteric, left femur ([place]) 03/27/2014   Hip fracture ([place]) 03/27/2014   DYSPNEA 06/05/2009   ESOPHAGEAL STRICTURE 06/04/2009   GERD 06/04/2009   PUD 06/04/2009   DEGENERATIVE [place] DISEASE 06/04/2009   MUSCLE SPASM, BACK 06/04/2009    Past Surgical History:  Procedure Laterality Date   APPENDECTOMY  1963   EYE SURGERY Bilateral    cataract removal - implants   HERNIA REPAIR  2002   right inguinal    HIP SURGERY Left    INGUINAL HERNIA REPAIR  08/19/2011   Procedure: HERNIA REPAIR  left INGUINAL ADULT;  Surgeon: Odis Hollingshead, MD;  Location: [facility];  Service: General;  Laterality: Left;   INTRAMEDULLARY (IM) NAIL INTERTROCHANTERIC Left 03/27/2014   Procedure: INTRAMEDULLARY (IM) NAIL INTERTROCHANTRIC;  Surgeon: Mauri Pole, MD;  Location: [facility];  Service: Orthopedics;  Laterality: Left;   RIGHT/LEFT HEART CATH AND CORONARY ANGIOGRAPHY N/A 03/12/2017   Procedure: Right/Left Heart Cath and Coronary Angiography;  Surgeon: Jolaine Artist, MD;  Location: [place] [facility];  Service: Cardiovascular;  Laterality: N/A;       Family History  Problem Relation Age of Onset   Cervical cancer Mother    Colon cancer Mother  Stroke Father    Heart attack Father    Breast cancer Sister    Throat cancer Brother    Liver cancer Brother    Brain cancer Brother    Esophageal cancer Neg Hx    Rectal cancer Neg Hx    Stomach cancer Neg Hx     Social History   Tobacco Use   Smoking status: Former    Types: Cigarettes    Quit date: 08/11/1981    Years since quitting: 40.1   Smokeless tobacco: Never  Vaping Use   Vaping Use: Never used  Substance Use Topics   Alcohol use: Yes    Comment: ocassionally   Drug use: No    Home Medications Prior to Admission medications   Medication Sig Start Date End Date Taking? Authorizing Provider  acetaminophen (TYLENOL) 325 MG  tablet Take 325 mg by mouth 2 (two) times daily.    [provider]  albuterol (PROVENTIL HFA;VENTOLIN HFA) 108 (90 Base) MCG/ACT inhaler Inhale 2 puffs into the lungs every 6 (six) hours as needed for wheezing or shortness of breath.    [provider]  alendronate (FOSAMAX) 70 MG tablet Take 70 mg by mouth once a week.  06/26/19   [provider]  amLODipine (NORVASC) 10 MG tablet Take 10 mg by mouth daily.    [provider]  Cholecalciferol (DIALYVITE VITAMIN D 5000 PO) Take 2 capsules by mouth daily.    [provider]  diclofenac Sodium (VOLTAREN) 1 % GEL Apply topically as needed.    [provider]  docusate sodium 100 MG CAPS Take 100 mg by mouth 2 (two) times daily. Patient taking differently: Take 100 mg by mouth daily as needed (constipation). 03/30/14   Rama, Venetia Maxon, MD  doxazosin (CARDURA) 8 MG tablet Take 8 mg by mouth daily.    [provider]  famotidine (PEPCID) 40 MG tablet Take 1 tablet (40 mg total) by mouth 2 (two) times daily. 11/08/20   Pyrtle, Lajuan Lines, MD  furosemide (LASIX) 40 MG tablet Take 20 mg by mouth daily.     [provider]  HYDROcodone-acetaminophen (NORCO) 5-325 MG per tablet Take 1-2 tablets by mouth every 6 (six) hours as needed. 03/29/14   Danae Orleans, PA-C  loratadine (CLARITIN) 10 MG tablet Take 10 mg by mouth daily.    [provider]  Magnesium 200 MG CHEW Chew 200 mg by mouth daily. 10/02/21   Martinique, Peter M, MD  montelukast (SINGULAIR) 10 MG tablet Take 10 mg by mouth daily.    [provider]  Polyethyl Glycol-Propyl Glycol (SYSTANE OP) Apply to eye as needed.    [provider]  rOPINIRole (REQUIP) 1 MG tablet Take 1 mg by mouth at bedtime.  06/24/11   [provider]  sertraline (ZOLOFT) 50 MG tablet Take 25 mg by mouth daily.     [provider]  tiZANidine (ZANAFLEX) 4 MG tablet Take 4 mg by mouth 2 (two) times daily. For pain; 1  -2 tablets depending on pain level 06/24/11   [provider]  zolpidem (AMBIEN) 10 MG tablet Take 10 mg by mouth at bedtime as needed for sleep.    [provider]    Allergies    Patient has no known allergies.  Review of Systems   Review of Systems  Constitutional:  Positive for appetite change, fatigue and fever.  HENT:  Positive for congestion and sore throat.   Respiratory:  Positive for  cough. Negative for shortness of breath.   Cardiovascular:  Negative for chest pain and palpitations.  Gastrointestinal:  Positive for nausea. Negative for abdominal pain, diarrhea and vomiting.  Musculoskeletal:  Negative for myalgias.  Skin:  Negative for rash.  Neurological:  Positive for dizziness. Negative for syncope, light-headedness and headaches.  All other systems reviewed and are negative.  Physical Exam Updated Vital Signs BP 114/66    Pulse 63    Temp 98.4 F (36.9 C)    Resp (!) 22    Ht 5\' 7"  (1.702 m)    Wt 63.5 kg    SpO2 97%    BMI 21.93 kg/m   Physical Exam Vitals and nursing note reviewed.  Constitutional:      General: He is not in acute distress.    Appearance: Normal appearance. He is ill-appearing. He is not toxic-appearing.  HENT:     Head: Normocephalic and atraumatic.     Nose: Congestion present.     Mouth/Throat:     Mouth: Mucous membranes are moist.     Pharynx: Oropharynx is clear. Posterior oropharyngeal erythema present.  Eyes:     General: No scleral icterus.    Extraocular Movements: Extraocular movements intact.     Conjunctiva/sclera: Conjunctivae normal.     Pupils: Pupils are equal, round, and reactive to light.  Cardiovascular:     Rate and Rhythm: Normal rate and regular rhythm.     Pulses: Normal pulses.     Heart sounds: Normal heart sounds. No murmur heard. Pulmonary:     Effort: Pulmonary effort is normal. No respiratory distress.     Breath sounds: Normal breath sounds.  Abdominal:     General: Bowel sounds are  normal. There is no distension.     Palpations: Abdomen is soft.     Tenderness: There is no abdominal tenderness.  Musculoskeletal:        General: No swelling. Normal range of motion.     Cervical back: Normal range of motion. No tenderness.  Skin:    General: Skin is warm and dry.     Capillary Refill: Capillary refill takes less than 2 seconds.     Findings: No rash.  Neurological:     General: No focal deficit present.     Mental Status: He is alert and oriented to person, place, and time. Mental status is at baseline.  Psychiatric:        Mood and Affect: Mood normal.        Behavior: Behavior normal.        Thought Content: Thought content normal.        Judgment: Judgment normal.    ED Results / Procedures / Treatments   Labs (all labs ordered are listed, but only abnormal results are displayed) Labs Reviewed  RESP PANEL BY RT-PCR (FLU A&B, COVID) [id number] - Abnormal; Notable for the following components:      Result Value   SARS Coronavirus 2 by RT PCR POSITIVE (*)    All other components within normal limits    EKG None  Radiology No results found.  Procedures Procedures   Medications Ordered in ED Medications - No data to display  ED Course  I have reviewed the triage vital signs and the nursing notes.  Pertinent labs & imaging results that were available during my care of the patient were reviewed by me and considered in my medical decision making (see chart for details).    MDM Rules/Calculators/A&P 86 year old male  who presents to the emergency department with viral upper respiratory symptoms.  This patient presents with symptoms suspicious for likely viral upper respiratory infection.  Respiratory swab positive for COVID-19.  Do not suspect underlying cardiopulmonary process. I considered, but think unlikely, dangerous causes of this patients symptoms to include ACS, CHF or COPD exacerbations, pneumonia, pneumothorax. Patient is nontoxic appearing  and not in need of emergent medical intervention. Given dizziness obtained orthostatic vital signs.  He is slightly orthostatic.  I offered him IV fluids however he declined. Given age will provide with Paxlovid   Vital signs are stable.  The patient is not in any respiratory distress.  I discussed with the patient return precautions such as increasing shortness of breath, chest pain or palpitations.  He verbalized understanding.  Safe for discharge.  Final Clinical Impression(s) / ED Diagnoses Final diagnoses:  [id number]    Rx / DC Orders ED Discharge Orders          Ordered    nirmatrelvir/ritonavir EUA, renal dosing, (PAXLOVID) 10 x 150 MG & 10 x 100MG  TABS  2 times daily        10/04/21 2118             Mickie Hillier, PA-C 10/05/21 1619    Fredia Sorrow, MD 10/16/21 [phone]

## 2021-10-04 NOTE — ED Notes (Signed)
Pt discharged home after verbalizing understanding of discharge instructions; nad noted. 

## 2021-10-04 NOTE — ED Notes (Signed)
With fever today, cough x 2 days; hip pain today, some dizziness that lasted an hour but has resolved. Pt states he feels better; denies any pain at this time.

## 2021-10-04 NOTE — ED Triage Notes (Signed)
Pt c/o fever, dizziness, nausea. Pt states he had fever of 103 earlier today. One dose of tylenol given to pt one hour prior to arrival.

## 2021-10-13 ENCOUNTER — Other Ambulatory Visit: Payer: Self-pay | Admitting: Urology

## 2021-11-01 DIAGNOSIS — N5201 Erectile dysfunction due to arterial insufficiency: Secondary | ICD-10-CM | POA: Diagnosis not present

## 2021-11-01 DIAGNOSIS — R351 Nocturia: Secondary | ICD-10-CM | POA: Diagnosis not present

## 2021-11-01 DIAGNOSIS — N401 Enlarged prostate with lower urinary tract symptoms: Secondary | ICD-10-CM | POA: Diagnosis not present

## 2021-11-12 DIAGNOSIS — I10 Essential (primary) hypertension: Secondary | ICD-10-CM | POA: Diagnosis not present

## 2021-11-12 DIAGNOSIS — E785 Hyperlipidemia, unspecified: Secondary | ICD-10-CM | POA: Diagnosis not present

## 2021-11-12 DIAGNOSIS — M810 Age-related osteoporosis without current pathological fracture: Secondary | ICD-10-CM | POA: Diagnosis not present

## 2021-11-12 DIAGNOSIS — Z125 Encounter for screening for malignant neoplasm of prostate: Secondary | ICD-10-CM | POA: Diagnosis not present

## 2021-11-13 DIAGNOSIS — R6884 Jaw pain: Secondary | ICD-10-CM | POA: Diagnosis not present

## 2021-11-13 DIAGNOSIS — U099 Post covid-19 condition, unspecified: Secondary | ICD-10-CM | POA: Diagnosis not present

## 2021-11-13 DIAGNOSIS — Z7409 Other reduced mobility: Secondary | ICD-10-CM | POA: Diagnosis not present

## 2021-11-13 DIAGNOSIS — R5383 Other fatigue: Secondary | ICD-10-CM | POA: Diagnosis not present

## 2021-11-13 DIAGNOSIS — S0300XA Dislocation of jaw, unspecified side, initial encounter: Secondary | ICD-10-CM | POA: Diagnosis not present

## 2021-11-13 DIAGNOSIS — F439 Reaction to severe stress, unspecified: Secondary | ICD-10-CM | POA: Diagnosis not present

## 2021-11-18 DIAGNOSIS — G8929 Other chronic pain: Secondary | ICD-10-CM | POA: Diagnosis not present

## 2021-11-18 DIAGNOSIS — U099 Post covid-19 condition, unspecified: Secondary | ICD-10-CM | POA: Diagnosis not present

## 2021-11-18 DIAGNOSIS — Z7409 Other reduced mobility: Secondary | ICD-10-CM | POA: Diagnosis not present

## 2021-11-18 DIAGNOSIS — Z0000 Encounter for general adult medical examination without abnormal findings: Secondary | ICD-10-CM | POA: Diagnosis not present

## 2021-11-18 DIAGNOSIS — S0300XA Dislocation of jaw, unspecified side, initial encounter: Secondary | ICD-10-CM | POA: Diagnosis not present

## 2021-11-18 DIAGNOSIS — E785 Hyperlipidemia, unspecified: Secondary | ICD-10-CM | POA: Diagnosis not present

## 2021-11-18 DIAGNOSIS — N1831 Chronic kidney disease, stage 3a: Secondary | ICD-10-CM | POA: Diagnosis not present

## 2021-11-18 DIAGNOSIS — R82998 Other abnormal findings in urine: Secondary | ICD-10-CM | POA: Diagnosis not present

## 2021-11-18 DIAGNOSIS — I129 Hypertensive chronic kidney disease with stage 1 through stage 4 chronic kidney disease, or unspecified chronic kidney disease: Secondary | ICD-10-CM | POA: Diagnosis not present

## 2021-11-18 DIAGNOSIS — I10 Essential (primary) hypertension: Secondary | ICD-10-CM | POA: Diagnosis not present

## 2021-11-18 DIAGNOSIS — G2581 Restless legs syndrome: Secondary | ICD-10-CM | POA: Diagnosis not present

## 2021-11-20 ENCOUNTER — Ambulatory Visit: Payer: Medicare HMO | Admitting: Internal Medicine

## 2021-11-21 ENCOUNTER — Encounter: Payer: Self-pay | Admitting: Internal Medicine

## 2021-11-21 ENCOUNTER — Ambulatory Visit (INDEPENDENT_AMBULATORY_CARE_PROVIDER_SITE_OTHER): Payer: Medicare PPO | Admitting: Internal Medicine

## 2021-11-21 VITALS — BP 120/60 | HR 75 | Ht 67.0 in | Wt 146.6 lb

## 2021-11-21 DIAGNOSIS — K900 Celiac disease: Secondary | ICD-10-CM | POA: Diagnosis not present

## 2021-11-21 DIAGNOSIS — K219 Gastro-esophageal reflux disease without esophagitis: Secondary | ICD-10-CM | POA: Diagnosis not present

## 2021-11-21 DIAGNOSIS — K224 Dyskinesia of esophagus: Secondary | ICD-10-CM

## 2021-11-21 DIAGNOSIS — R1319 Other dysphagia: Secondary | ICD-10-CM

## 2021-11-21 NOTE — Progress Notes (Signed)
° °  Subjective:    Patient ID: [name], male    DOB: 06/13/1935, 87 y.o.   MRN: [id number]  HPI [name] is an 87 year old male with a history of GERD, esophageal ring with prior dilation, esophageal dysmotility, history of esophageal candidiasis, celiac disease, collagenous colitis in remission, diverticulosis, IBS with intermittent abdominal pain who is here for follow-up.  He is here today with his wife.  I last saw him in December 2021.  He reports that for the most part he has been doing well but he has issues with near daily esophageal dysphagia.  He reports that with eating he has to stop and regurgitate food back up.  This seems to happen almost every day with solid food.  Pills seem to go down fine.  He has had some breakthrough heartburn despite famotidine 40 mg twice daily.  He is not having nausea, vomiting, abdominal pain or diarrhea.  He has not had much abdominal pain of late.  He reports his weight is stable.   Review of Systems As per HPI, otherwise negative  Current Medications, Allergies, Past Medical History, Past Surgical History, Family History and Social History were reviewed in [employer] record.    Objective:   Physical Exam BP 120/60    Pulse 75    Ht 5\' 7"  (1.702 m)    Wt 146 lb 9.6 oz (66.5 kg)    SpO2 98%    BMI 22.96 kg/m  Gen: awake, alert, NAD HEENT: anicteric, op clear CV: RRR Pulm: CTA b/l Abd: soft, NT/ND, +BS throughout Ext: no c/c/e Neuro: nonfocal  Last EGD July 2021 --patchy candidiasis, benign moderate Schatzki's ring/stenosis at the GE junction.  Dilated to 17 mm with balloon with moderate mucosal disruption.  Duodenal polyps removed were not adenomatous.     Assessment & Plan:  87 year old male with a history of GERD, esophageal ring with prior dilation, esophageal dysmotility, history of esophageal candidiasis, celiac disease, collagenous colitis in remission, diverticulosis, IBS with intermittent abdominal  pain who is here for follow-up.   Esophageal dysphagia/GERD history/esophageal dysmotility --he has esophageal dysmotility defined by prior barium esophagram though he also has esophageal stenosis seemingly responsive to dilation in the past.  He is interested in repeat upper endoscopy for dilation.  This is reasonable but I would like to discuss this further with his daughter.  Overall he is doing well though the test likely has slightly higher than average risk based on his age. --Consideration of EGD with dilation; I will discuss with his daughter; patient wishes to proceed --Continue famotidine 40 mg twice daily; we are avoiding PPI given history of microscopic colitis  2.  Celiac disease --in remission on gluten-free diet  3.  Collagenous colitis --in remission, previously responsive to budesonide  4.  IBS with history of abdominal pain --not an issue of late.  He will occasionally use MiraLAX for constipation when this occurs but has not needed this of late.  She just  30 minutes total spent today including patient facing time, coordination of care, reviewing medical history/procedures/pertinent radiology studies, and documentation of the encounter.

## 2021-11-21 NOTE — Progress Notes (Signed)
Gerd

## 2021-11-21 NOTE — Patient Instructions (Signed)
Continue Famotidine twice daily.   Will discuss EGD with daughter and give you a call to schedule at a later time.   If you are age 85 or older, your body mass index should be between 23-30. Your Body mass index is 22.96 kg/m. If this is out of the aforementioned range listed, please consider follow up with your Primary Care Provider.  The [facility] providers would like to encourage you to use [facility] to communicate with providers for non-urgent requests or questions.  Due to long hold times on the telephone, sending your provider a message by [facility] may be a faster and more efficient way to get a response.  Please allow 48 business hours for a response.  Please remember that this is for non-urgent requests.  _______________________________________________________  Thank you for choosing me and [facility].  Dr.Jay Pyrtle

## 2021-12-17 ENCOUNTER — Telehealth: Payer: Self-pay | Admitting: Internal Medicine

## 2021-12-17 DIAGNOSIS — R1319 Other dysphagia: Secondary | ICD-10-CM

## 2021-12-17 DIAGNOSIS — K224 Dyskinesia of esophagus: Secondary | ICD-10-CM

## 2021-12-17 DIAGNOSIS — K219 Gastro-esophageal reflux disease without esophagitis: Secondary | ICD-10-CM

## 2021-12-17 NOTE — Telephone Encounter (Signed)
Patient's wife called and stated patient is ready to proceed with the EGD with dilation.  Please call patient to schedule. ?

## 2021-12-17 NOTE — Telephone Encounter (Signed)
Patient has been scheduled for endoscopy with dilation on 01/09/22. He verbalizes understanding. ?

## 2022-01-07 DIAGNOSIS — Z85828 Personal history of other malignant neoplasm of skin: Secondary | ICD-10-CM | POA: Diagnosis not present

## 2022-01-07 DIAGNOSIS — L821 Other seborrheic keratosis: Secondary | ICD-10-CM | POA: Diagnosis not present

## 2022-01-07 DIAGNOSIS — L570 Actinic keratosis: Secondary | ICD-10-CM | POA: Diagnosis not present

## 2022-01-09 ENCOUNTER — Encounter: Payer: Self-pay | Admitting: Internal Medicine

## 2022-01-09 ENCOUNTER — Ambulatory Visit (AMBULATORY_SURGERY_CENTER): Payer: Medicare PPO | Admitting: Internal Medicine

## 2022-01-09 VITALS — BP 100/56 | HR 63 | Temp 98.6°F | Resp 13 | Ht 67.0 in | Wt 146.0 lb

## 2022-01-09 DIAGNOSIS — R1310 Dysphagia, unspecified: Secondary | ICD-10-CM | POA: Diagnosis not present

## 2022-01-09 DIAGNOSIS — K224 Dyskinesia of esophagus: Secondary | ICD-10-CM | POA: Diagnosis not present

## 2022-01-09 DIAGNOSIS — K449 Diaphragmatic hernia without obstruction or gangrene: Secondary | ICD-10-CM | POA: Diagnosis not present

## 2022-01-09 DIAGNOSIS — K900 Celiac disease: Secondary | ICD-10-CM | POA: Diagnosis not present

## 2022-01-09 DIAGNOSIS — R1319 Other dysphagia: Secondary | ICD-10-CM

## 2022-01-09 DIAGNOSIS — K219 Gastro-esophageal reflux disease without esophagitis: Secondary | ICD-10-CM

## 2022-01-09 HISTORY — PX: ESOPHAGOGASTRODUODENOSCOPY: SHX1529

## 2022-01-09 MED ORDER — SODIUM CHLORIDE 0.9 % IV SOLN: 500.0000 mL | Freq: Once | INTRAVENOUS | Status: DC

## 2022-01-09 NOTE — Progress Notes (Signed)
Pt in recovery with monitors in place, VSS. Report given to receiving RN. Bite guard was placed with pt awake to ensure comfort. No dental or soft tissue damage noted.  ?

## 2022-01-09 NOTE — Progress Notes (Signed)
Pt's states no medical or surgical changes since previsit or office visit. 

## 2022-01-09 NOTE — Patient Instructions (Signed)
Handouts given for esophagitis.  Resume previous diet.  Continue present medications.   ? ?YOU HAD AN ENDOSCOPIC PROCEDURE TODAY AT [facility]:   Refer to the procedure report that was given to you for any specific questions about what was found during the examination.  If the procedure report does not answer your questions, please call your gastroenterologist to clarify.  If you requested that your care partner not be given the details of your procedure findings, then the procedure report has been included in a sealed envelope for you to review at your convenience later. ? ?YOU SHOULD EXPECT: Some feelings of bloating in the abdomen. Passage of more gas than usual.  Walking can help get rid of the air that was put into your GI tract during the procedure and reduce the bloating. If you had a lower endoscopy (such as a colonoscopy or flexible sigmoidoscopy) you may notice spotting of blood in your stool or on the toilet paper. If you underwent a bowel prep for your procedure, you may not have a normal bowel movement for a few days. ? ?Please Note:  You might notice some irritation and congestion in your nose or some drainage.  This is from the oxygen used during your procedure.  There is no need for concern and it should clear up in a day or so. ? ?SYMPTOMS TO REPORT IMMEDIATELY: ? ? ?Following upper endoscopy (EGD) ? Vomiting of blood or coffee ground material ? New chest pain or pain under the shoulder blades ? Painful or persistently difficult swallowing ? New shortness of breath ? Fever of 100?F or higher ? Black, tarry-looking stools ? ?For urgent or emergent issues, a gastroenterologist can be reached at any hour by calling [phone]. ?Do not use MyChart messaging for urgent concerns.  ? ? ?DIET:  We do recommend a small meal at first, but then you may proceed to your regular diet.  Drink plenty of fluids but you should avoid alcoholic beverages for 24 hours. ? ?ACTIVITY:  You should  plan to take it easy for the rest of today and you should NOT DRIVE or use heavy machinery until tomorrow (because of the sedation medicines used during the test).   ? ?FOLLOW UP: ?Our staff will call the number listed on your records 48-72 hours following your procedure to check on you and address any questions or concerns that you may have regarding the information given to you following your procedure. If we do not reach you, we will leave a message.  We will attempt to reach you two times.  During this call, we will ask if you have developed any symptoms of COVID 19. If you develop any symptoms (ie: fever, flu-like symptoms, shortness of breath, cough etc.) before then, please call [phone].  If you test positive for Covid 19 in the 2 weeks post procedure, please call and report this information to [place].   ? ?If any biopsies were taken you will be contacted by phone or by letter within the next 1-3 weeks.  Please call us at [phone] if you have not heard about the biopsies in 3 weeks.  ? ? ?SIGNATURES/CONFIDENTIALITY: ?You and/or your care partner have signed paperwork which will be entered into your electronic medical record.  These signatures attest to the fact that that the information above on your After Visit Summary has been reviewed and is understood.  Full responsibility of the confidentiality of this discharge information lies with you and/or your  care-partner.  ?

## 2022-01-09 NOTE — Progress Notes (Signed)
? ?GASTROENTEROLOGY PROCEDURE H&P NOTE  ? ?Primary Care Physician: ?Rigoberto Noel, MD ? ? ? ?Reason for Procedure:  Dysphagia with history of stricture responsive to dilation ? ?Plan:    EGD with dilation ? ?Patient is appropriate for endoscopic procedure(s) in the ambulatory ([facility]) setting. ? ?The nature of the procedure, as well as the risks, benefits, and alternatives were carefully and thoroughly reviewed with the patient. Ample time for discussion and questions allowed. The patient understood, was satisfied, and agreed to proceed.  ? ? ? ?HPI: ?[name] is a 87 y.o. male who presents for EGD with dilation.  Medical history as below.   No recent chest pain or shortness of breath.  No abdominal pain today. ? ?Past Medical History:  ?Diagnosis Date  ? Anemia   ? Arthritis   ? osteoarthritis  back  ? Celiac disease   ? Chronic kidney disease   ? Collagenous colitis   ? Diverticulosis   ? Enlarged prostate   ? followed by dr  Dorina Hoyer  ? Esophageal dysmotility   ? Esophageal dysmotility   ? Esophageal stenosis   ? Essential hypertension, benign 03/29/2014  ? External hemorrhoids   ? External hemorrhoids   ? Full dentures   ? GERD (gastroesophageal reflux disease)   ? Hemorrhoids   ? Hiatal hernia   ? IBS (irritable bowel syndrome)   ? Internal hemorrhoids   ? OSA on CPAP 10/17/2018  ? Osteoporosis   ? Postoperative anemia due to acute blood loss 03/29/2014  ? Schatzki's ring   ? Sleep apnea   ? CPAP Machine  ? Ulcer   ? ? ?Past Surgical History:  ?Procedure Laterality Date  ? APPENDECTOMY  1963  ? ESOPHAGOGASTRODUODENOSCOPY  01/09/2022  ? EYE SURGERY Bilateral   ? cataract removal - implants  ? HERNIA REPAIR  2002  ? right inguinal   ? HIP SURGERY Left   ? INGUINAL HERNIA REPAIR  08/19/2011  ? Procedure: HERNIA REPAIR  left INGUINAL ADULT;  Surgeon: Odis Hollingshead, MD;  Location: [facility];  Service: General;  Laterality: Left;  ? INTRAMEDULLARY (IM) NAIL INTERTROCHANTERIC Left 03/27/2014  ? Procedure:  INTRAMEDULLARY (IM) NAIL INTERTROCHANTRIC;  Surgeon: Mauri Pole, MD;  Location: [facility];  Service: Orthopedics;  Laterality: Left;  ? RIGHT/LEFT HEART CATH AND CORONARY ANGIOGRAPHY N/A 03/12/2017  ? Procedure: Right/Left Heart Cath and Coronary Angiography;  Surgeon: Jolaine Artist, MD;  Location: [facility];  Service: Cardiovascular;  Laterality: N/A;  ? UPPER GASTROINTESTINAL ENDOSCOPY  2008  ? ? ?Prior to Admission medications   ?Medication Sig Start Date End Date Taking? Authorizing Provider  ?alendronate (FOSAMAX) 70 MG tablet Take 70 mg by mouth once a week.  06/26/19  Yes [provider]  ?amLODipine (NORVASC) 10 MG tablet Take 10 mg by mouth daily.   Yes [provider]  ?D 1000 25 MCG (1000 UT) capsule SMARTSIG:1 By Mouth 01/07/22  Yes [provider]  ?dicyclomine (BENTYL) 10 MG capsule Take by mouth. 01/07/22  Yes [provider]  ?furosemide (LASIX) 40 MG tablet Take 20 mg by mouth daily.    Yes [provider]  ?HYDROcodone-acetaminophen (NORCO) 5-325 MG per tablet Take 1-2 tablets by mouth every 6 (six) hours as needed. 03/29/14  Yes Babish, Rodman Key, PA-C  ?loratadine (CLARITIN) 10 MG tablet Take 10 mg by mouth daily.   Yes [provider]  ?meloxicam (MOBIC) 15 MG tablet  01/07/22  Yes [provider]  ?  montelukast (SINGULAIR) 10 MG tablet Take 10 mg by mouth daily.   Yes [provider]  ?rOPINIRole (REQUIP) 1 MG tablet Take 1 mg by mouth at bedtime.  06/24/11  Yes [provider]  ?sertraline (ZOLOFT) 50 MG tablet Take 25 mg by mouth daily.    Yes [provider]  ?tiZANidine (ZANAFLEX) 4 MG tablet Take 4 mg by mouth 2 (two) times daily. For pain; 1 -2 tablets depending on pain level 06/24/11  Yes [provider]  ?ZANAFLEX 4 MG capsule SMARTSIG:1 By Mouth 01/07/22  Yes [provider]  ?zolpidem (AMBIEN) 10 MG tablet Take 10 mg by mouth at bedtime as needed for sleep.   Yes [provider]  ?acetaminophen (TYLENOL) 325 MG tablet Take 325 mg by mouth 2 (two) times daily.    [provider]  ?albuterol (PROVENTIL HFA;VENTOLIN HFA) 108 (90 Base) MCG/ACT inhaler Inhale 2 puffs into the lungs every 6 (six) hours as needed for wheezing or shortness of breath.    [provider]  ?budesonide (ENTOCORT EC) 3 MG 24 hr capsule Take by mouth. 01/07/22   [provider]  ?diclofenac Sodium (VOLTAREN) 1 % GEL Apply topically as needed.    [provider]  ?docusate sodium 100 MG CAPS Take 100 mg by mouth 2 (two) times daily. ?Patient taking differently: Take 100 mg by mouth daily as needed (constipation). 03/30/14   Rama, Venetia Maxon, MD  ?doxazosin (CARDURA) 2 MG tablet SMARTSIG:1 Tablet(s) By Mouth 01/07/22   [provider]  ?doxazosin (CARDURA) 8 MG tablet Take 8 mg by mouth daily.    [provider]  ?famotidine (PEPCID) 40 MG tablet Take 1 tablet (40 mg total) by mouth 2 (two) times daily. 11/08/20   Tashawn Greff, Lajuan Lines, MD  ?KLOR-CON 20 MEQ packet SMARTSIG:1 Pack(s) By Mouth 01/07/22   [provider]  ?Magnesium 200 MG CHEW Chew 200 mg by mouth daily. ?Patient not taking: Reported on 01/09/2022 10/02/21   Martinique, Peter M, MD  ?NEXIUM 20 MG capsule Take by mouth. 01/07/22   [provider]  ?omeprazole (PRILOSEC) 40 MG capsule SMARTSIG:1 By Mouth 01/07/22   [provider]  ?PEPCID 20 MG tablet SMARTSIG:1 Tablet(s) By Mouth 01/07/22   [provider]  ?Polyethyl Glycol-Propyl Glycol (SYSTANE OP) Apply to eye as needed.    [provider]  ?valACYclovir (VALTREX) 1000 MG tablet SMARTSIG:2 Tablet(s) By Mouth Every 12 Hours PRN 10/11/21   [provider]  ? ? ?Current Outpatient Medications  ?Medication Sig Dispense Refill  ? alendronate (FOSAMAX) 70 MG tablet Take 70 mg by mouth once a week.     ? amLODipine (NORVASC) 10 MG tablet Take 10 mg by mouth daily.    ? D 1000 25 MCG (1000 UT) capsule  SMARTSIG:1 By Mouth    ? dicyclomine (BENTYL) 10 MG capsule Take by mouth.    ? furosemide (LASIX) 40 MG tablet Take 20 mg by mouth daily.     ? HYDROcodone-acetaminophen (NORCO) 5-325 MG per tablet Take 1-2 tablets by mouth every 6 (six) hours as needed. 90 tablet 0  ? loratadine (CLARITIN) 10 MG tablet Take 10 mg by mouth daily.    ? meloxicam (MOBIC) 15 MG tablet     ? montelukast (SINGULAIR) 10 MG tablet Take 10 mg by mouth daily.    ? rOPINIRole (REQUIP) 1 MG tablet Take 1 mg by mouth at bedtime.     ? sertraline (ZOLOFT) 50 MG tablet  Take 25 mg by mouth daily.     ? tiZANidine (ZANAFLEX) 4 MG tablet Take 4 mg by mouth 2 (two) times daily. For pain; 1 -2 tablets depending on pain level    ? ZANAFLEX 4 MG capsule SMARTSIG:1 By Mouth    ? zolpidem (AMBIEN) 10 MG tablet Take 10 mg by mouth at bedtime as needed for sleep.    ? acetaminophen (TYLENOL) 325 MG tablet Take 325 mg by mouth 2 (two) times daily.    ? albuterol (PROVENTIL HFA;VENTOLIN HFA) 108 (90 Base) MCG/ACT inhaler Inhale 2 puffs into the lungs every 6 (six) hours as needed for wheezing or shortness of breath.    ? budesonide (ENTOCORT EC) 3 MG 24 hr capsule Take by mouth.    ? diclofenac Sodium (VOLTAREN) 1 % GEL Apply topically as needed.    ? docusate sodium 100 MG CAPS Take 100 mg by mouth 2 (two) times daily. (Patient taking differently: Take 100 mg by mouth daily as needed (constipation).) 10 capsule 0  ? doxazosin (CARDURA) 2 MG tablet SMARTSIG:1 Tablet(s) By Mouth    ? doxazosin (CARDURA) 8 MG tablet Take 8 mg by mouth daily.    ? famotidine (PEPCID) 40 MG tablet Take 1 tablet (40 mg total) by mouth 2 (two) times daily. 180 tablet 2  ? KLOR-CON 20 MEQ packet SMARTSIG:1 Pack(s) By Mouth    ? Magnesium 200 MG CHEW Chew 200 mg by mouth daily. (Patient not taking: Reported on 01/09/2022) 30 tablet 11  ? NEXIUM 20 MG capsule Take by mouth.    ? omeprazole (PRILOSEC) 40 MG capsule SMARTSIG:1 By Mouth    ? PEPCID 20 MG tablet SMARTSIG:1 Tablet(s) By  Mouth    ? Polyethyl Glycol-Propyl Glycol (SYSTANE OP) Apply to eye as needed.    ? valACYclovir (VALTREX) 1000 MG tablet SMARTSIG:2 Tablet(s) By Mouth Every 12 Hours PRN    ? ?Current Facility-Administered Medications

## 2022-01-09 NOTE — Op Note (Signed)
[place] ?Patient Name: [name] ?Procedure Date: 01/09/2022 2:17 PM ?MRN: [id number] ?Endoscopist: Jerene Bears , MD ?Age: 87 ?Referring MD:  ?Date of Birth: 1934/11/26 ?Gender: Male ?Account #: [id number] ?Procedure:                Upper GI endoscopy ?Indications:              Dysphagia, hx of stricture in distal esophagus  ?                          responsive to dilation last in July 2021 ?Medicines:                Propofol per Anesthesia ?Procedure:                Pre-Anesthesia Assessment: ?                          - Prior to the procedure, a History and Physical  ?                          was performed, and patient medications and  ?                          allergies were reviewed. The patient's tolerance of  ?                          previous anesthesia was also reviewed. The risks  ?                          and benefits of the procedure and the sedation  ?                          options and risks were discussed with the patient.  ?                          All questions were answered, and informed consent  ?                          was obtained. Prior Anticoagulants: The patient has  ?                          taken no previous anticoagulant or antiplatelet  ?                          agents. ASA Grade Assessment: III - A patient with  ?                          severe systemic disease. After reviewing the risks  ?                          and benefits, the patient was deemed in  ?                          satisfactory condition to undergo the procedure. ?  After obtaining informed consent, the endoscope was  ?                          passed under direct vision. Throughout the  ?                          procedure, the patient's blood pressure, pulse, and  ?                          oxygen saturations were monitored continuously. The  ?                          GIF [id number] #[id number] was introduced through the  ?                          mouth, and advanced to  the second part of duodenum.  ?                          The upper GI endoscopy was accomplished without  ?                          difficulty. The patient tolerated the procedure  ?                          well. ?Scope In: ?Scope Out: ?Findings:                 LA Grade A (one or more mucosal breaks less than 5  ?                          mm, not extending between tops of 2 mucosal folds)  ?                          esophagitis with no bleeding was found at the  ?                          gastroesophageal junction. ?                          One benign-appearing, intrinsic moderate  ?                          (circumferential scarring or stenosis; an endoscope  ?                          may pass) stenosis was found 38 cm from the  ?                          incisors. This stenosis measured 1.2 cm (inner  ?                          diameter) x less than one cm (in length). The  ?  stenosis was traversed. A TTS dilator was passed  ?                          through the scope. Dilation with a 16-17-18 mm  ?                          balloon dilator was performed to 16 mm. The  ?                          dilation site was examined and showed moderate  ?                          mucosal disruption. ?                          A 1 cm hiatal hernia was present. ?                          The entire examined stomach was normal. ?                          The examined duodenum was normal. ?Complications:            No immediate complications. ?Estimated Blood Loss:     Estimated blood loss was minimal. ?Impression:               - LA Grade A reflux esophagitis with no bleeding. ?                          - Benign-appearing esophageal stenosis. Dilated to  ?                          16 mm with balloon with moderate mucosal disruption. ?                          - 1 cm hiatal hernia. ?                          - Normal stomach. ?                          - Normal examined duodenum. ?                           - No specimens collected. ?Recommendation:           - Patient has a contact number available for  ?                          emergencies. The signs and symptoms of potential  ?                          delayed complications were discussed with the  ?                          patient. Return to normal activities tomorrow.  ?  Written discharge instructions were provided to the  ?                          patient. ?                          - Resume previous diet. ?                          - Continue present medications. Esophagitis is  ?                          present despite high dose famotidine 40 mg BID. We  ?                          have been avoid PPI due to hx of microscopic  ?                          colitis (which has been in remission). We can  ?                          consider re-addition of PPI if heartburn or  ?                          dysphagia returns in short order. ?Jerene Bears, MD ?01/09/2022 2:47:38 PM ?This report has been signed electronically. ?

## 2022-01-13 ENCOUNTER — Telehealth: Payer: Self-pay

## 2022-01-13 NOTE — Telephone Encounter (Signed)
?  Follow up Call- ? ? ?  01/09/2022  ?  1:43 PM 04/19/2020  ?  8:09 AM 06/16/2019  ?  2:40 PM  ?Call back number  ?Post procedure Call Back phone  # [phone]  ?Permission to leave phone message Yes Yes Yes  ?  ? ?Patient questions: ? ?Do you have a fever, pain , or abdominal swelling? No. ?Pain Score  0 * ? ?Have you tolerated food without any problems? Yes.   ? ?Have you been able to return to your normal activities? Yes.   ? ?Do you have any questions about your discharge instructions: ?Diet   No. ?Medications  No. ?Follow up visit  No. ? ?Do you have questions or concerns about your Care? No. ? ?Actions: ?* If pain score is 4 or above: ?No action needed, pain <4. ? ? ?

## 2022-02-04 NOTE — Progress Notes (Signed)
? ?Virtual Visit via Video Note  ? ?This visit type was conducted due to national recommendations for restrictions regarding the COVID-19 Pandemic (e.g. social distancing) in an effort to limit this patient's exposure and mitigate transmission in our community.  Due to his co-morbid illnesses, this patient is at least at moderate risk for complications without adequate follow up.  This format is felt to be most appropriate for this patient at this time.   All issues noted in this document were discussed and addressed.  A limited physical exam was performed with this format.  Please refer to the patient's chart for his  consent to telehealth for [facility].  ? ?Evaluation Performed:  Follow-up visit ? ?Date:  02/05/2022  ? ?ID:  [name], DOB 1935/09/30, MRN [id number] ? ?Patient Location:  ?Home ? ?Provider location:   ?[place] ? ?PCP:  Rigoberto Noel, MD  ?Sleep Medicine:  Fransico Him, MD ?Electrophysiologist:  None  ? ?Chief Complaint:  OSA, HTN ? ?History of Present Illness:   ? ?[name] is a 87 y.o. male  with a hx of mild OSA with an AHI of 12/hr and oxygen desaturations as low as 79%.  He is on CPAP at  9cm H2O. ? ?he is doing well with his CPAP device and thinks that he has gotten used to it.  He tolerates the mask and feels the pressure is adequate but sometimes gets claustrophobic with the mask on and takes it off.  This only occurs occasionally.  Since going on CPAP he feels rested in the am and has no significant daytime sleepiness.  He still occasionally will nap during the day.  He denies any significant  nasal dryness or nasal congestion.  He does have some problems with dry mouth at times.  He does not think that he snores.    ? ?The patient does not have symptoms concerning for COVID-19 infection (fever, chills, cough, or new shortness of breath).  ? ?Prior CV studies:   ?The following studies were reviewed today: ? ?PAP compliance download ? ?Past Medical History:  ?Diagnosis  Date  ? Anemia   ? Arthritis   ? osteoarthritis  back  ? Celiac disease   ? Chronic kidney disease   ? Collagenous colitis   ? Diverticulosis   ? Enlarged prostate   ? followed by dr  Dorina Hoyer  ? Esophageal dysmotility   ? Esophageal dysmotility   ? Esophageal stenosis   ? Essential hypertension, benign 03/29/2014  ? External hemorrhoids   ? External hemorrhoids   ? Full dentures   ? GERD (gastroesophageal reflux disease)   ? Hemorrhoids   ? Hiatal hernia   ? IBS (irritable bowel syndrome)   ? Internal hemorrhoids   ? OSA on CPAP 10/17/2018  ? Osteoporosis   ? Postoperative anemia due to acute blood loss 03/29/2014  ? Schatzki's ring   ? Sleep apnea   ? CPAP Machine  ? Ulcer   ? ?Past Surgical History:  ?Procedure Laterality Date  ? APPENDECTOMY  1963  ? ESOPHAGOGASTRODUODENOSCOPY  01/09/2022  ? EYE SURGERY Bilateral   ? cataract removal - implants  ? HERNIA REPAIR  2002  ? right inguinal   ? HIP SURGERY Left   ? INGUINAL HERNIA REPAIR  08/19/2011  ? Procedure: HERNIA REPAIR  left INGUINAL ADULT;  Surgeon: Odis Hollingshead, MD;  Location: [facility];  Service: General;  Laterality: Left;  ? INTRAMEDULLARY (IM) NAIL INTERTROCHANTERIC Left 03/27/2014  ?  Procedure: INTRAMEDULLARY (IM) NAIL INTERTROCHANTRIC;  Surgeon: Mauri Pole, MD;  Location: [facility];  Service: Orthopedics;  Laterality: Left;  ? RIGHT/LEFT HEART CATH AND CORONARY ANGIOGRAPHY N/A 03/12/2017  ? Procedure: Right/Left Heart Cath and Coronary Angiography;  Surgeon: Jolaine Artist, MD;  Location: [place];  Service: Cardiovascular;  Laterality: N/A;  ? UPPER GASTROINTESTINAL ENDOSCOPY  2008  ?  ? ?Current Meds  ?Medication Sig  ? acetaminophen (TYLENOL) 325 MG tablet Take 325 mg by mouth 2 (two) times daily.  ? albuterol (PROVENTIL HFA;VENTOLIN HFA) 108 (90 Base) MCG/ACT inhaler Inhale 2 puffs into the lungs every 6 (six) hours as needed for wheezing or shortness of breath.  ? alendronate (FOSAMAX) 70 MG tablet Take 70 mg by mouth once a week.    ? amLODipine (NORVASC) 10 MG tablet Take 10 mg by mouth daily.  ? budesonide (ENTOCORT EC) 3 MG 24 hr capsule Take by mouth.  ? D 1000 25 MCG (1000 UT) capsule SMARTSIG:1 By Mouth  ? diclofenac Sodium (VOLTAREN) 1 % GEL Apply topically as needed.  ? dicyclomine (BENTYL) 10 MG capsule Take by mouth.  ? docusate sodium 100 MG CAPS Take 100 mg by mouth 2 (two) times daily. (Patient taking differently: Take 100 mg by mouth daily as needed (constipation).)  ? doxazosin (CARDURA) 2 MG tablet SMARTSIG:1 Tablet(s) By Mouth  ? doxazosin (CARDURA) 8 MG tablet Take 8 mg by mouth daily.  ? famotidine (PEPCID) 40 MG tablet Take 1 tablet (40 mg total) by mouth 2 (two) times daily.  ? furosemide (LASIX) 40 MG tablet Take 20 mg by mouth daily.   ? HYDROcodone-acetaminophen (NORCO) 5-325 MG per tablet Take 1-2 tablets by mouth every 6 (six) hours as needed.  ? KLOR-CON 20 MEQ packet SMARTSIG:1 Pack(s) By Mouth  ? loratadine (CLARITIN) 10 MG tablet Take 10 mg by mouth daily.  ? meloxicam (MOBIC) 15 MG tablet   ? montelukast (SINGULAIR) 10 MG tablet Take 10 mg by mouth daily.  ? NEXIUM 20 MG capsule Take by mouth.  ? omeprazole (PRILOSEC) 40 MG capsule SMARTSIG:1 By Mouth  ? PEPCID 20 MG tablet SMARTSIG:1 Tablet(s) By Mouth  ? Polyethyl Glycol-Propyl Glycol (SYSTANE OP) Apply to eye as needed.  ? rOPINIRole (REQUIP) 1 MG tablet Take 1 mg by mouth at bedtime.   ? sertraline (ZOLOFT) 50 MG tablet Take 25 mg by mouth daily.   ? tiZANidine (ZANAFLEX) 4 MG tablet Take 4 mg by mouth 2 (two) times daily. For pain; 1 -2 tablets depending on pain level  ? valACYclovir (VALTREX) 1000 MG tablet SMARTSIG:2 Tablet(s) By Mouth Every 12 Hours PRN  ? ZANAFLEX 4 MG capsule SMARTSIG:1 By Mouth  ? zolpidem (AMBIEN) 10 MG tablet Take 10 mg by mouth at bedtime as needed for sleep.  ?  ? ?Allergies:   Wheat bran  ? ?Social History  ? ?Tobacco Use  ? Smoking status: Former  ?  Types: Cigarettes  ?  Quit date: 08/11/1981  ?  Years since quitting: 40.5  ?  Smokeless tobacco: Never  ?Vaping Use  ? Vaping Use: Never used  ?Substance Use Topics  ? Alcohol use: Yes  ?  Comment: ocassionally  ? Drug use: No  ?  ? ?Family Hx: ?The patient's family history includes Brain cancer in his brother; Breast cancer in his sister; Cervical cancer in his mother; Colon cancer in his mother; Heart attack in his father; Liver cancer in his brother; Stroke in his father;  Throat cancer in his brother. There is no history of Esophageal cancer, Rectal cancer, or Stomach cancer. ? ?ROS:   ?Please see the history of present illness.    ? ?All other systems reviewed and are negative. ? ? ?Labs/Other Tests and Data Reviewed:   ? ?Recent Labs: ?No results found for requested labs within last 8760 hours.  ? ?Recent Lipid Panel ?Lab Results  ?Component Value Date/Time  ? CHOL  04/25/2009 01:55 AM  ?  134        ?ATP III CLASSIFICATION: ? <200     mg/dL   Desirable ? 200-239  mg/dL   Borderline High ? >=240    mg/dL   High ?        ? TRIG 57 04/25/2009 01:55 AM  ? HDL 73 04/25/2009 01:55 AM  ? CHOLHDL 1.8 04/25/2009 01:55 AM  ? LDLCALC  04/25/2009 01:55 AM  ?  50        ?Total Cholesterol/HDL:CHD Risk ?Coronary Heart Disease Risk Table ?                    Men   Women ? 1/2 Average Risk   3.4   3.3 ? Average Risk       5.0   4.4 ? 2 X Average Risk   9.6   7.1 ? 3 X Average Risk  23.4   11.0 ?       ?Use the calculated Patient Ratio ?above and the CHD Risk Table ?to determine the patient's CHD Risk. ?       ?ATP III CLASSIFICATION (LDL): ? <100     mg/dL   Optimal ? 100-129  mg/dL   Near or Above ?                   Optimal ? 130-159  mg/dL   Borderline ? 160-189  mg/dL   High ? >190     mg/dL   Very High  ? ? ?Wt Readings from Last 3 Encounters:  ?02/05/22 142 lb (64.4 kg)  ?01/09/22 146 lb (66.2 kg)  ?11/21/21 146 lb 9.6 oz (66.5 kg)  ?  ? ?Objective:   ? ?Vital Signs:  Ht 5' 7"  (1.702 m)   Wt 142 lb (64.4 kg)   BMI 22.24 kg/m?   ?Well nourished, well developed male in no acute distress. ?Well  appearing, alert and conversant, regular work of breathing,  good skin color  ?Eyes- anicteric ?mouth- oral mucosa is pink  ?neuro- grossly intact ?skin- no apparent rash or lesions or cyanosis  ? ?ASSESSMENT

## 2022-02-05 ENCOUNTER — Encounter: Payer: Self-pay | Admitting: Cardiology

## 2022-02-05 ENCOUNTER — Telehealth (INDEPENDENT_AMBULATORY_CARE_PROVIDER_SITE_OTHER): Payer: Medicare PPO | Admitting: Cardiology

## 2022-02-05 VITALS — Ht 67.0 in | Wt 142.0 lb

## 2022-02-05 DIAGNOSIS — G4733 Obstructive sleep apnea (adult) (pediatric): Secondary | ICD-10-CM | POA: Diagnosis not present

## 2022-02-05 DIAGNOSIS — Z9989 Dependence on other enabling machines and devices: Secondary | ICD-10-CM | POA: Diagnosis not present

## 2022-02-05 DIAGNOSIS — I10 Essential (primary) hypertension: Secondary | ICD-10-CM | POA: Diagnosis not present

## 2022-02-05 NOTE — Patient Instructions (Signed)
Medication Instructions:  ?Your physician recommends that you continue on your current medications as directed. Please refer to the Current Medication list given to you today. ? ?*If you need a refill on your cardiac medications before your next appointment, please call your pharmacy* ? ?Follow-Up: ?At [facility], you and your health needs are our priority.  As part of our continuing mission to provide you with exceptional heart care, we have created designated Provider Care Teams.  These Care Teams include your primary Cardiologist (physician) and Advanced Practice Providers (APPs -  Physician Assistants and Nurse Practitioners) who all work together to provide you with the care you need, when you need it. ? ?Your next appointment:   ?1 year(s) ? ?The format for your next appointment:   ?Virtual Visit  ? ?Provider:   ?Fransico Him, MD ? ? ?Important Information About Sugar ? ? ? ? ?  ?

## 2022-02-07 ENCOUNTER — Telehealth: Payer: Self-pay | Admitting: *Deleted

## 2022-02-07 DIAGNOSIS — G4733 Obstructive sleep apnea (adult) (pediatric): Secondary | ICD-10-CM

## 2022-02-07 NOTE — Telephone Encounter (Signed)
-----   Message from Antonieta Iba, RN sent at 02/05/2022 11:04 AM EDT ----- ?Per Dr. Radford Pax: ?Please change him to auto CPAP from 4 to 12cm H2O and get a download in 4 weeks.  ?Thanks! ?Carly  ? ?

## 2022-02-07 NOTE — Telephone Encounter (Signed)
Order placed to [facility] via community message. 

## 2022-02-12 NOTE — Telephone Encounter (Signed)
From: Ayesha Rumpf <[email or web address] ?Sent: Tuesday, Feb 11, 2022 4:48 PM ?To: Darlina Guys <  ?Patient: [name] ?DOB: 1935-06-04  ?[place]  ? ?Received prescription for pressure change the patient currently has a machine that does not have Auto Capability so we are unable to change settings. The patient is going to be eligible for replacement after 05/26/2022.  ? ?Thank you!  ? ?Melissa Stenson <[email or web address]> ?Sydell Axon;Fransico Him ?Good morning Dr. Radford Pax, ? ?Please see update below.  Do you think this patient can wait until August for a new auto titrating CPAP?  If not, I can check on possibly getting him a loaner.  ? ?Thanks, ?Melissa ? ? ?

## 2022-02-13 NOTE — Telephone Encounter (Addendum)
I have notified [name] by via email [facility], [place] that Per dr Radford Pax "the patient needs his machine now because he feels like he is smothering at night." ?

## 2022-03-05 ENCOUNTER — Ambulatory Visit (INDEPENDENT_AMBULATORY_CARE_PROVIDER_SITE_OTHER): Payer: Medicare PPO | Admitting: Orthopaedic Surgery

## 2022-03-05 ENCOUNTER — Ambulatory Visit (INDEPENDENT_AMBULATORY_CARE_PROVIDER_SITE_OTHER): Payer: Medicare PPO

## 2022-03-05 DIAGNOSIS — M7062 Trochanteric bursitis, left hip: Secondary | ICD-10-CM

## 2022-03-05 DIAGNOSIS — M25552 Pain in left hip: Secondary | ICD-10-CM

## 2022-03-05 NOTE — Progress Notes (Signed)
Office Visit Note   Patient: [name]           Date of Birth: 11/19/1934           MRN: [id number] Visit Date: 03/05/2022              Requested by: Rigoberto Noel, [place] [place],  [place] [id number] PCP: Rigoberto Noel, MD   Assessment & Plan: Visit Diagnoses:  1. Pain in left hip   2. Trochanteric bursitis, left hip     Plan: I did give him reassurance that his hip does not need any further surgery at all.  I did recommend a steroid injection of the trochanteric area and he agreed to this and tolerated it well.  He will also try Voltaren gel on this area 2-3 times a day.  He will try not to sleep on that side.  Follow-up is as needed.  We can always repeat injection 3 to 4 months from now if needed.  Follow-Up Instructions: Return if symptoms worsen or fail to improve.   Orders:  Orders Placed This Encounter  Procedures   XR HIP UNILAT W OR W/O PELVIS 1V LEFT   No orders of the defined types were placed in this encounter.     Procedures: No procedures performed   Clinical Data: No additional findings.   Subjective: Chief Complaint  Patient presents with   Left Hip - Pain  The patient, I am seeing for the first time.  He is an 87 year old gentleman who comes in for evaluation treatment of left hip pain and he points to the trochanteric area as a source of his left hip pain.  8 years ago he had a mechanical fall and sustained a intertrochanteric hip fracture.  This was fixed by one of my colleagues in town.  He denies any groin pain.  He does not walk with assistive device.  He says most of his pain is at night when he lays on that side.  He is not a diabetic either.  He has had no acute change in medical status.  This is been hurting for some time now.  HPI  Review of Systems There is currently listed no fever, chills, nausea, vomiting  Objective: Vital Signs: There were no vitals taken for this visit.  Physical Exam He is alert and  orient x3 and in no acute distress Ortho Exam Examination of his left hip shows it moves smoothly and fluidly.  He only has pain over the trochanteric area of the left hip.  There is well-healed surgical incisions on that side. Specialty Comments:  No specialty comments available.  Imaging: XR HIP UNILAT W OR W/O PELVIS 1V LEFT  Result Date: 03/05/2022 An AP pelvis and lateral left hip shows hardware from her previous intertrochanteric fracture of the left hip.  The fracture is healed completely.  The hip joint space is well-maintained.  There is no complicating features of the hardware.    PMFS History: Patient Active Problem List   Diagnosis Date Noted   Diarrhea 05/19/2019   OSA on CPAP 10/17/2018   Essential hypertension, benign 03/29/2014   BPH (benign prostatic hyperplasia) 03/29/2014   Postoperative anemia due to acute blood loss 03/29/2014   Fracture, intertrochanteric, left femur ([place]) 03/27/2014   Hip fracture ([place]) 03/27/2014   DYSPNEA 06/05/2009   ESOPHAGEAL STRICTURE 06/04/2009   GERD 06/04/2009   PUD 06/04/2009   DEGENERATIVE [place] DISEASE 06/04/2009  MUSCLE SPASM, BACK 06/04/2009   Past Medical History:  Diagnosis Date   Anemia    Arthritis    osteoarthritis  back   Celiac disease    Chronic kidney disease    Collagenous colitis    Diverticulosis    Enlarged prostate    followed by dr  Dorina Hoyer   Esophageal dysmotility    Esophageal dysmotility    Esophageal stenosis    Essential hypertension, benign 03/29/2014   External hemorrhoids    External hemorrhoids    Full dentures    GERD (gastroesophageal reflux disease)    Hemorrhoids    Hiatal hernia    IBS (irritable bowel syndrome)    Internal hemorrhoids    OSA on CPAP 10/17/2018   Osteoporosis    Postoperative anemia due to acute blood loss 03/29/2014   Schatzki's ring    Sleep apnea    CPAP Machine   Ulcer     Family History  Problem Relation Age of Onset   Cervical cancer Mother    Colon  cancer Mother    Stroke Father    Heart attack Father    Breast cancer Sister    Throat cancer Brother    Liver cancer Brother    Brain cancer Brother    Esophageal cancer Neg Hx    Rectal cancer Neg Hx    Stomach cancer Neg Hx     Past Surgical History:  Procedure Laterality Date   APPENDECTOMY  1963   ESOPHAGOGASTRODUODENOSCOPY  01/09/2022   EYE SURGERY Bilateral    cataract removal - implants   HERNIA REPAIR  2002   right inguinal    HIP SURGERY Left    INGUINAL HERNIA REPAIR  08/19/2011   Procedure: HERNIA REPAIR  left INGUINAL ADULT;  Surgeon: Odis Hollingshead, MD;  Location: [facility];  Service: General;  Laterality: Left;   INTRAMEDULLARY (IM) NAIL INTERTROCHANTERIC Left 03/27/2014   Procedure: INTRAMEDULLARY (IM) NAIL INTERTROCHANTRIC;  Surgeon: Mauri Pole, MD;  Location: [facility];  Service: Orthopedics;  Laterality: Left;   RIGHT/LEFT HEART CATH AND CORONARY ANGIOGRAPHY N/A 03/12/2017   Procedure: Right/Left Heart Cath and Coronary Angiography;  Surgeon: Jolaine Artist, MD;  Location: [facility];  Service: Cardiovascular;  Laterality: N/A;   UPPER GASTROINTESTINAL ENDOSCOPY  2008   Social History   Occupational History   Occupation: retired  Tobacco Use   Smoking status: Former    Types: Cigarettes    Quit date: 08/11/1981    Years since quitting: 40.5   Smokeless tobacco: Never  Vaping Use   Vaping Use: Never used  Substance and Sexual Activity   Alcohol use: Yes    Comment: ocassionally   Drug use: No   Sexual activity: Not on file

## 2022-03-19 DIAGNOSIS — E785 Hyperlipidemia, unspecified: Secondary | ICD-10-CM | POA: Diagnosis not present

## 2022-03-19 DIAGNOSIS — G8929 Other chronic pain: Secondary | ICD-10-CM | POA: Diagnosis not present

## 2022-03-19 DIAGNOSIS — I129 Hypertensive chronic kidney disease with stage 1 through stage 4 chronic kidney disease, or unspecified chronic kidney disease: Secondary | ICD-10-CM | POA: Diagnosis not present

## 2022-03-19 DIAGNOSIS — N1831 Chronic kidney disease, stage 3a: Secondary | ICD-10-CM | POA: Diagnosis not present

## 2022-03-19 DIAGNOSIS — F439 Reaction to severe stress, unspecified: Secondary | ICD-10-CM | POA: Diagnosis not present

## 2022-03-19 DIAGNOSIS — M810 Age-related osteoporosis without current pathological fracture: Secondary | ICD-10-CM | POA: Diagnosis not present

## 2022-03-19 DIAGNOSIS — G2581 Restless legs syndrome: Secondary | ICD-10-CM | POA: Diagnosis not present

## 2022-03-19 DIAGNOSIS — D649 Anemia, unspecified: Secondary | ICD-10-CM | POA: Diagnosis not present

## 2022-03-19 DIAGNOSIS — M1990 Unspecified osteoarthritis, unspecified site: Secondary | ICD-10-CM | POA: Diagnosis not present

## 2022-04-04 DIAGNOSIS — H43813 Vitreous degeneration, bilateral: Secondary | ICD-10-CM | POA: Diagnosis not present

## 2022-04-04 DIAGNOSIS — Z961 Presence of intraocular lens: Secondary | ICD-10-CM | POA: Diagnosis not present

## 2022-04-04 DIAGNOSIS — H04213 Epiphora due to excess lacrimation, bilateral lacrimal glands: Secondary | ICD-10-CM | POA: Diagnosis not present

## 2022-04-04 DIAGNOSIS — H52203 Unspecified astigmatism, bilateral: Secondary | ICD-10-CM | POA: Diagnosis not present

## 2022-04-28 ENCOUNTER — Telehealth: Payer: Self-pay | Admitting: Cardiology
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
## 2022-05-01 DIAGNOSIS — M545 Low back pain, unspecified: Secondary | ICD-10-CM | POA: Diagnosis not present

## 2022-05-13 DIAGNOSIS — M5451 Vertebrogenic low back pain: Secondary | ICD-10-CM | POA: Diagnosis not present
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
## 2022-05-27 DIAGNOSIS — M5451 Vertebrogenic low back pain: Secondary | ICD-10-CM | POA: Diagnosis not present

## 2022-05-27 DIAGNOSIS — M546 Pain in thoracic spine: Secondary | ICD-10-CM | POA: Diagnosis not present
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
## 2022-06-08 DIAGNOSIS — M546 Pain in thoracic spine: Secondary | ICD-10-CM | POA: Diagnosis not present

## 2022-06-08 DIAGNOSIS — M5451 Vertebrogenic low back pain: Secondary | ICD-10-CM | POA: Diagnosis not present

## 2022-06-13 ENCOUNTER — Encounter: Payer: Self-pay | Admitting: Cardiology

## 2022-06-13 ENCOUNTER — Ambulatory Visit: Payer: Medicare PPO | Attending: Cardiology | Admitting: Cardiology

## 2022-06-13 VITALS — BP 128/50 | HR 74 | Ht 67.0 in | Wt 140.0 lb

## 2022-06-13 DIAGNOSIS — G4733 Obstructive sleep apnea (adult) (pediatric): Secondary | ICD-10-CM

## 2022-06-13 DIAGNOSIS — I10 Essential (primary) hypertension: Secondary | ICD-10-CM

## 2022-06-13 DIAGNOSIS — Z9989 Dependence on other enabling machines and devices: Secondary | ICD-10-CM

## 2022-06-13 DIAGNOSIS — G2581 Restless legs syndrome: Secondary | ICD-10-CM

## 2022-06-13 MED ORDER — DOXAZOSIN MESYLATE 8 MG PO TABS: 8.0000 mg | ORAL_TABLET | Freq: Every day | ORAL | Status: DC

## 2022-06-17 DIAGNOSIS — M5451 Vertebrogenic low back pain: Secondary | ICD-10-CM | POA: Diagnosis not present

## 2022-06-17 DIAGNOSIS — M546 Pain in thoracic spine: Secondary | ICD-10-CM | POA: Diagnosis not present

## 2022-07-01 DIAGNOSIS — L570 Actinic keratosis: Secondary | ICD-10-CM | POA: Diagnosis not present

## 2022-07-01 DIAGNOSIS — L821 Other seborrheic keratosis: Secondary | ICD-10-CM | POA: Diagnosis not present

## 2022-07-01 DIAGNOSIS — Z85828 Personal history of other malignant neoplasm of skin: Secondary | ICD-10-CM | POA: Diagnosis not present

## 2022-07-01 DIAGNOSIS — D485 Neoplasm of uncertain behavior of skin: Secondary | ICD-10-CM | POA: Diagnosis not present

## 2022-07-04 DIAGNOSIS — S32010A Wedge compression fracture of first lumbar vertebra, initial encounter for closed fracture: Secondary | ICD-10-CM | POA: Diagnosis not present

## 2022-07-07 ENCOUNTER — Ambulatory Visit: Payer: Medicare PPO | Admitting: Orthopaedic Surgery

## 2022-07-16 DIAGNOSIS — M546 Pain in thoracic spine: Secondary | ICD-10-CM | POA: Diagnosis not present

## 2022-07-16 DIAGNOSIS — M5451 Vertebrogenic low back pain: Secondary | ICD-10-CM | POA: Diagnosis not present

## 2022-07-23 DIAGNOSIS — M5451 Vertebrogenic low back pain: Secondary | ICD-10-CM | POA: Diagnosis not present

## 2022-07-29 DIAGNOSIS — M5451 Vertebrogenic low back pain: Secondary | ICD-10-CM | POA: Diagnosis not present

## 2022-07-31 DIAGNOSIS — M5451 Vertebrogenic low back pain: Secondary | ICD-10-CM | POA: Diagnosis not present

## 2022-08-05 DIAGNOSIS — M5451 Vertebrogenic low back pain: Secondary | ICD-10-CM | POA: Diagnosis not present

## 2022-08-07 DIAGNOSIS — M5451 Vertebrogenic low back pain: Secondary | ICD-10-CM | POA: Diagnosis not present

## 2022-08-14 DIAGNOSIS — E785 Hyperlipidemia, unspecified: Secondary | ICD-10-CM | POA: Diagnosis not present

## 2022-08-14 DIAGNOSIS — G2581 Restless legs syndrome: Secondary | ICD-10-CM | POA: Diagnosis not present

## 2022-08-14 DIAGNOSIS — I129 Hypertensive chronic kidney disease with stage 1 through stage 4 chronic kidney disease, or unspecified chronic kidney disease: Secondary | ICD-10-CM | POA: Diagnosis not present

## 2022-08-14 DIAGNOSIS — Z23 Encounter for immunization: Secondary | ICD-10-CM | POA: Diagnosis not present

## 2022-08-14 DIAGNOSIS — K900 Celiac disease: Secondary | ICD-10-CM | POA: Diagnosis not present

## 2022-08-14 DIAGNOSIS — M1990 Unspecified osteoarthritis, unspecified site: Secondary | ICD-10-CM | POA: Diagnosis not present

## 2022-08-14 DIAGNOSIS — G8929 Other chronic pain: Secondary | ICD-10-CM | POA: Diagnosis not present

## 2022-08-14 DIAGNOSIS — N1831 Chronic kidney disease, stage 3a: Secondary | ICD-10-CM | POA: Diagnosis not present

## 2022-08-14 DIAGNOSIS — I872 Venous insufficiency (chronic) (peripheral): Secondary | ICD-10-CM | POA: Diagnosis not present

## 2022-08-14 DIAGNOSIS — M810 Age-related osteoporosis without current pathological fracture: Secondary | ICD-10-CM | POA: Diagnosis not present

## 2022-09-18 DIAGNOSIS — D485 Neoplasm of uncertain behavior of skin: Secondary | ICD-10-CM | POA: Diagnosis not present

## 2022-09-18 DIAGNOSIS — L821 Other seborrheic keratosis: Secondary | ICD-10-CM | POA: Diagnosis not present

## 2022-09-18 DIAGNOSIS — Z85828 Personal history of other malignant neoplasm of skin: Secondary | ICD-10-CM | POA: Diagnosis not present

## 2022-09-18 DIAGNOSIS — L570 Actinic keratosis: Secondary | ICD-10-CM | POA: Diagnosis not present

## 2022-10-09 ENCOUNTER — Telehealth: Payer: Self-pay | Admitting: Cardiology

## 2022-10-09 NOTE — Telephone Encounter (Signed)
Patient called in to schedule his yearly f/u in April with Dr. Radford Pax. He also wanted to f/u on his message from April 2023 - see phone note from 02/07/22. Patient states that he never heard anything regarding his CPAP and that it is still messing up. Please advise what he needs to do.

## 2022-10-10 NOTE — Telephone Encounter (Signed)
Reached out to patient and he states he wants to wait until the new year when he has new insurance to move forward.

## 2022-10-23 DIAGNOSIS — M5451 Vertebrogenic low back pain: Secondary | ICD-10-CM | POA: Diagnosis not present

## 2022-11-17 DIAGNOSIS — N401 Enlarged prostate with lower urinary tract symptoms: Secondary | ICD-10-CM | POA: Diagnosis not present

## 2022-11-17 DIAGNOSIS — R351 Nocturia: Secondary | ICD-10-CM | POA: Diagnosis not present

## 2022-11-21 DIAGNOSIS — Z125 Encounter for screening for malignant neoplasm of prostate: Secondary | ICD-10-CM | POA: Diagnosis not present

## 2022-11-21 DIAGNOSIS — E785 Hyperlipidemia, unspecified: Secondary | ICD-10-CM | POA: Diagnosis not present

## 2022-11-21 DIAGNOSIS — D649 Anemia, unspecified: Secondary | ICD-10-CM | POA: Diagnosis not present

## 2022-11-21 DIAGNOSIS — M810 Age-related osteoporosis without current pathological fracture: Secondary | ICD-10-CM | POA: Diagnosis not present

## 2022-11-21 DIAGNOSIS — K219 Gastro-esophageal reflux disease without esophagitis: Secondary | ICD-10-CM | POA: Diagnosis not present

## 2022-11-28 DIAGNOSIS — G2581 Restless legs syndrome: Secondary | ICD-10-CM | POA: Diagnosis not present

## 2022-11-28 DIAGNOSIS — Z7409 Other reduced mobility: Secondary | ICD-10-CM | POA: Diagnosis not present

## 2022-11-28 DIAGNOSIS — I872 Venous insufficiency (chronic) (peripheral): Secondary | ICD-10-CM | POA: Diagnosis not present

## 2022-11-28 DIAGNOSIS — I129 Hypertensive chronic kidney disease with stage 1 through stage 4 chronic kidney disease, or unspecified chronic kidney disease: Secondary | ICD-10-CM | POA: Diagnosis not present

## 2022-11-28 DIAGNOSIS — D649 Anemia, unspecified: Secondary | ICD-10-CM | POA: Diagnosis not present

## 2022-11-28 DIAGNOSIS — Z23 Encounter for immunization: Secondary | ICD-10-CM | POA: Diagnosis not present

## 2022-11-28 DIAGNOSIS — G8929 Other chronic pain: Secondary | ICD-10-CM | POA: Diagnosis not present

## 2022-11-28 DIAGNOSIS — N1831 Chronic kidney disease, stage 3a: Secondary | ICD-10-CM | POA: Diagnosis not present

## 2022-11-28 DIAGNOSIS — M1990 Unspecified osteoarthritis, unspecified site: Secondary | ICD-10-CM | POA: Diagnosis not present

## 2022-11-28 DIAGNOSIS — Z0000 Encounter for general adult medical examination without abnormal findings: Secondary | ICD-10-CM | POA: Diagnosis not present

## 2022-11-28 DIAGNOSIS — I10 Essential (primary) hypertension: Secondary | ICD-10-CM | POA: Diagnosis not present

## 2022-12-05 ENCOUNTER — Telehealth: Payer: Self-pay

## 2022-12-05 DIAGNOSIS — R0602 Shortness of breath: Secondary | ICD-10-CM | POA: Diagnosis not present

## 2022-12-05 DIAGNOSIS — J189 Pneumonia, unspecified organism: Secondary | ICD-10-CM | POA: Diagnosis not present

## 2022-12-05 DIAGNOSIS — I4891 Unspecified atrial fibrillation: Secondary | ICD-10-CM | POA: Diagnosis not present

## 2022-12-05 DIAGNOSIS — I130 Hypertensive heart and chronic kidney disease with heart failure and stage 1 through stage 4 chronic kidney disease, or unspecified chronic kidney disease: Secondary | ICD-10-CM | POA: Diagnosis not present

## 2022-12-05 DIAGNOSIS — R600 Localized edema: Secondary | ICD-10-CM | POA: Diagnosis not present

## 2022-12-05 DIAGNOSIS — I872 Venous insufficiency (chronic) (peripheral): Secondary | ICD-10-CM | POA: Diagnosis not present

## 2022-12-05 DIAGNOSIS — N1831 Chronic kidney disease, stage 3a: Secondary | ICD-10-CM | POA: Diagnosis not present

## 2022-12-05 DIAGNOSIS — I509 Heart failure, unspecified: Secondary | ICD-10-CM | POA: Diagnosis not present

## 2022-12-05 NOTE — Telephone Encounter (Signed)
Call received from patient's PCP who saw the patient today. He was found to be in atrial fibrillation . He has also gained 8lbs in one week. He was started on Eliquis and metoprolol. He was advised to double his Lasix for the weekend and is getting repeat labs on Monday. PCP requesting patient be seen by cardiology next week for follow up. Patient has been scheduled to see Dr. Martinique next Wednesday.

## 2022-12-08 DIAGNOSIS — D649 Anemia, unspecified: Secondary | ICD-10-CM | POA: Diagnosis not present

## 2022-12-08 DIAGNOSIS — J189 Pneumonia, unspecified organism: Secondary | ICD-10-CM | POA: Diagnosis not present

## 2022-12-08 DIAGNOSIS — R600 Localized edema: Secondary | ICD-10-CM | POA: Diagnosis not present

## 2022-12-08 DIAGNOSIS — I129 Hypertensive chronic kidney disease with stage 1 through stage 4 chronic kidney disease, or unspecified chronic kidney disease: Secondary | ICD-10-CM | POA: Diagnosis not present

## 2022-12-08 DIAGNOSIS — I509 Heart failure, unspecified: Secondary | ICD-10-CM | POA: Diagnosis not present

## 2022-12-08 DIAGNOSIS — I4891 Unspecified atrial fibrillation: Secondary | ICD-10-CM | POA: Diagnosis not present

## 2022-12-08 DIAGNOSIS — N1831 Chronic kidney disease, stage 3a: Secondary | ICD-10-CM | POA: Diagnosis not present

## 2022-12-08 DIAGNOSIS — R0602 Shortness of breath: Secondary | ICD-10-CM | POA: Diagnosis not present

## 2022-12-08 NOTE — Progress Notes (Unsigned)
Cardiology Office Note   Date:  12/10/2022   ID:  Charles Charles, DOB 30-Apr-1935, MRN [id number]  PCP:  Charles Solian, MD  Cardiologist:   Charles Mccarey Martinique, MD   Chief Complaint  Patient presents with   Hypertension      History of Present Illness: Charles Charles is a 88 y.o. male who is seen for follow up. I care for his wife Charles Charles and he is Dr Vivi Martens father in law. He has a history of HTN and OSA on CPAP. Prior Myoview in 2010 was normal. He had a cardiac cath in 2018 showing normal coronaries and normal right heart hemodynamics. Echo in 2018 showed mild MR-otherwise normal. He is followed by Dr Radford Pax for OSA. Has restless leg syndrome. Prior arterial dopplers normal.   He was seen 3 weeks ago by Dr Dagmar Hait for general physical and was doing well. About 9 days ago he developed worsening SOB and edema. Noted dry cough. Was seen back at Dr Danna Hefty office and found to be in AFib with rate 110s. Was placed on Eliquis and metoprolol.  Lasix dose was increased to 80 mg daily from 40 mg daily. On follow up Monday creatinine had increased from 1.4 to 1.9 so lasix was reduced to 40 mg daily. CXR done. Was placed on antibiotics for possible PNA. Swelling has improved but he still complains of SOB at rest, orthopnea. Weight did go up to 152 but has improved. He denies any palpitations. Does note slight chest pain at times.   Past Medical History:  Diagnosis Date   Anemia    Arthritis    osteoarthritis  back   Celiac disease    Chronic kidney disease    Collagenous colitis    Diverticulosis    Enlarged prostate    followed by dr  Dorina Hoyer   Esophageal dysmotility    Esophageal dysmotility    Esophageal stenosis    Essential hypertension, benign 03/29/2014   External hemorrhoids    External hemorrhoids    Full dentures    GERD (gastroesophageal reflux disease)    Hemorrhoids    Hiatal hernia    IBS (irritable bowel syndrome)    Internal hemorrhoids    OSA on CPAP 10/17/2018    Osteoporosis    Postoperative anemia due to acute blood loss 03/29/2014   Schatzki's ring    Sleep apnea    CPAP Machine   Ulcer     Past Surgical History:  Procedure Laterality Date   APPENDECTOMY  1963   ESOPHAGOGASTRODUODENOSCOPY  01/09/2022   EYE SURGERY Bilateral    cataract removal - implants   HERNIA REPAIR  2002   right inguinal    HIP SURGERY Left    INGUINAL HERNIA REPAIR  08/19/2011   Procedure: HERNIA REPAIR  left INGUINAL ADULT;  Surgeon: Odis Hollingshead, MD;  Location: [facility];  Service: General;  Laterality: Left;   INTRAMEDULLARY (IM) NAIL INTERTROCHANTERIC Left 03/27/2014   Procedure: INTRAMEDULLARY (IM) NAIL INTERTROCHANTRIC;  Surgeon: Mauri Pole, MD;  Location: [facility];  Service: Orthopedics;  Laterality: Left;   RIGHT/LEFT HEART CATH AND CORONARY ANGIOGRAPHY N/A 03/12/2017   Procedure: Right/Left Heart Cath and Coronary Angiography;  Surgeon: Jolaine Artist, MD;  Location: [facility];  Service: Cardiovascular;  Laterality: N/A;   UPPER GASTROINTESTINAL ENDOSCOPY  2008     Current Outpatient Medications  Medication Sig Dispense Refill   acetaminophen (TYLENOL) 325 MG tablet Take 325 mg by mouth 2 (two)  times daily.     albuterol (PROVENTIL HFA;VENTOLIN HFA) 108 (90 Base) MCG/ACT inhaler Inhale 2 puffs into the lungs every 6 (six) hours as needed for wheezing or shortness of breath.     alendronate (FOSAMAX) 70 MG tablet Take 70 mg by mouth once a week.      amLODipine (NORVASC) 10 MG tablet Take 10 mg by mouth daily.     budesonide (ENTOCORT EC) 3 MG 24 hr capsule Take by mouth.     cefdinir (OMNICEF) 300 MG capsule Take 300 mg by mouth 2 (two) times daily.     D 1000 25 MCG (1000 UT) capsule SMARTSIG:1 By Mouth     diclofenac Sodium (VOLTAREN) 1 % GEL Apply topically as needed.     dicyclomine (BENTYL) 10 MG capsule Take by mouth.     docusate sodium 100 MG CAPS Take 100 mg by mouth 2 (two) times daily. (Patient taking differently: Take 100 mg  by mouth daily as needed (constipation).) 10 capsule 0   doxazosin (CARDURA) 8 MG tablet Take 1 tablet (8 mg total) by mouth daily.     ELIQUIS 5 MG TABS tablet Take 5 mg by mouth 2 (two) times daily.     famotidine (PEPCID) 40 MG tablet Take 1 tablet (40 mg total) by mouth 2 (two) times daily. 180 tablet 2   furosemide (LASIX) 40 MG tablet Take 40 mg by mouth daily.     HYDROcodone-acetaminophen (NORCO) 5-325 MG per tablet Take 1-2 tablets by mouth every 6 (six) hours as needed. 90 tablet 0   KLOR-CON 20 MEQ packet SMARTSIG:1 Pack(s) By Mouth     loratadine (CLARITIN) 10 MG tablet Take 10 mg by mouth daily.     Magnesium 200 MG CHEW Chew 200 mg by mouth daily. 30 tablet 11   metoprolol tartrate (LOPRESSOR) 25 MG tablet Take 12.5 mg by mouth 2 (two) times daily.     montelukast (SINGULAIR) 10 MG tablet Take 10 mg by mouth daily.     NEXIUM 20 MG capsule Take by mouth.     omeprazole (PRILOSEC) 40 MG capsule SMARTSIG:1 By Mouth     PEPCID 20 MG tablet SMARTSIG:1 Tablet(s) By Mouth     Polyethyl Glycol-Propyl Glycol (SYSTANE OP) Apply to eye as needed.     rOPINIRole (REQUIP) 1 MG tablet Take 1 mg by mouth at bedtime.      sertraline (ZOLOFT) 50 MG tablet Take 25 mg by mouth daily.      tiZANidine (ZANAFLEX) 4 MG tablet Take 4 mg by mouth 2 (two) times daily. For pain; 1 -2 tablets depending on pain level     valACYclovir (VALTREX) 1000 MG tablet SMARTSIG:2 Tablet(s) By Mouth Every 12 Hours PRN     ZANAFLEX 4 MG capsule SMARTSIG:1 By Mouth     zolpidem (AMBIEN) 10 MG tablet Take 10 mg by mouth at bedtime as needed for sleep.     No current facility-administered medications for this visit.    Allergies:   Wheat bran    Social History:  The patient  reports that he quit smoking about 41 years ago. His smoking use included cigarettes. He has never used smokeless tobacco. He reports current alcohol use. He reports that he does not use drugs.   Family History:  The patient's family history  includes Brain cancer in his brother; Breast cancer in his sister; Cervical cancer in his mother; Colon cancer in his mother; Heart attack in his father; Liver cancer in his  brother; Stroke in his father; Throat cancer in his brother.    ROS:  Please see the history of present illness.   Otherwise, review of systems are positive for none.   All other systems are reviewed and negative.    PHYSICAL EXAM: VS:  BP (!) 136/54   Pulse 72   Ht '5\' 7"'$  (1.702 m)   Wt 148 lb (67.1 kg)   SpO2 90%   BMI 23.18 kg/m  , BMI Body mass index is 23.18 kg/m. GEN: Well nourished, well developed, in no acute distress HEENT: normal Neck: + JVD to jaw, carotid bruits, or masses Cardiac: IRRR; no murmurs, rubs, or gallops,no edema  Respiratory:  minimal rales.  GI: soft, nontender, nondistended, + BS MS: no deformity or atrophy. Pedal pulse are good. Trace edema. Skin: warm and dry, no rash Neuro:  Strength and sensation are intact Psych: euthymic mood, full affect   EKG:  EKG is ordered today.Afib  rate 72. Mild nonspecific ST abnormality. I have personally reviewed and interpreted this study.    Recent Labs: No results found for requested labs within last 365 days.   Dated 11/06/20: cholesterol 158, triglycerides 119, HDL 49, LDL 85, creatinine 1.5. otherwise CMET normal. TSH normal  Dated 11/12/21: cholesterol 169, triglycerides 100, HDL 50, LDL 99. LFTs normal.    Lipid Panel    Component Value Date/Time   CHOL  04/25/2009 0155    134        ATP III CLASSIFICATION:  <200     mg/dL   Desirable  200-239  mg/dL   Borderline High  >=240    mg/dL   High          TRIG 57 04/25/2009 0155   HDL 73 04/25/2009 0155   CHOLHDL 1.8 04/25/2009 0155   VLDL 11 04/25/2009 0155   LDLCALC  04/25/2009 0155    50        Total Cholesterol/HDL:CHD Risk Coronary Heart Disease Risk Table                     Men   Women  1/2 Average Risk   3.4   3.3  Average Risk       5.0   4.4  2 X Average Risk   9.6    7.1  3 X Average Risk  23.4   11.0        Use the calculated Patient Ratio above and the CHD Risk Table to determine the patient's CHD Risk.        ATP III CLASSIFICATION (LDL):  <100     mg/dL   Optimal  100-129  mg/dL   Near or Above                    Optimal  130-159  mg/dL   Borderline  160-189  mg/dL   High  >190     mg/dL   Very High      Wt Readings from Last 3 Encounters:  12/10/22 148 lb (67.1 kg)  06/13/22 140 lb (63.5 kg)  02/05/22 142 lb (64.4 kg)      Other studies Reviewed: Additional studies/ records that were reviewed today include:   Cardiac cath 03/12/17:  Right/Left Heart Cath and Coronary Angiography   Conclusion    The left ventricular ejection fraction is 55-65% by visual estimate.   Findings:   RA = 2 RV = 24/1 PA = 22/3 (13) PCW = 3 Fick  cardiac output/index = 5.9/3.4 PVR = 1.7 WU Ao sat = 97% PA sat = 70%, 74%   Assessment: 1. Normal hemodynamics 2. EF 60% 3. Normal coronary arteries   Plan/Discussion:   Normal cath. Continue medical management.    Glori Bickers, MD  11:07 AM   Echo 03/19/17: Study Conclusions   - Left ventricle: The cavity size was normal. Systolic function was    normal. The estimated ejection fraction was in the range of 55%    to 60%. Wall motion was normal; there were no regional wall    motion abnormalities.  - Mitral valve: There was mild regurgitation.  - Right atrium: Cannot r/o RA cor triatriatom should not be of any    clinical significance.  - Atrial septum: No defect or patent foramen ovale was identified.  - Pulmonary arteries: PA peak pressure: 34 mm Hg (S).   LE dopplers 06/11/21: Summary:  Right: Resting right ankle-brachial index indicates noncompressible right  lower extremity arteries. The right toe-brachial index is normal. Brisk,  multiphasic waveforms from the groin to ankle consistent with no evidence  of significant peripheral  arterial disease.   Left: Resting left  ankle-brachial index is within normal range. No  evidence of significant left lower extremity arterial disease. The left  toe-brachial index is mildly abnormal. Brisk, multiphasic waveforms from  the groin to ankle consistent with no  evidence of significant peripheral arterial disease.     *See table(s) above for measurements and observations.   ASSESSMENT AND PLAN:  1.  Atrial fibrillation new onset associated with acute diastolic CHF. Rate now well controlled. Has been on Eliquis for 5 days. Given significant symptoms will arrange for TEE cardioversion early next week- March 5. Procedure and risks discussed in detail with patient and daughter.  2. Acute diastolic CHF. Volume status improved but Creatinine increased with 80 mg lasix. Recommend he take lasix 40 mg in am and 20 mg in the pm. Restrict salt intake. He needs to stop taking meloxicam given need to take anticoagulation and with CKD. Will repeat BMET and BNP today. Requested copy of recent labs and CXR from PCP.  3.  HTN on amlodipine, Cardura and lasix.now on metoprolol as well for rate control.  3. OSA on CPAP   Current medicines are reviewed at length with the patient today.  The patient does not have concerns regarding medicines.  The following changes have been made:  stop Meloxicam. Increase lasix to 40 mg in am and 20 mg in pm. Low sodium diet. Continue Eliquis and metoprolol.   Labs/ tests ordered today include:   Orders Placed This Encounter  Procedures   Basic Metabolic Panel (BMET)   CBC   Pro b natriuretic peptide (BNP)[id number])   EKG 12-Lead      Disposition:  plan TEE cardioversion on Tuesday March 5. I will follow up on March 11.   Signed, Shaida Route Martinique, MD  12/10/2022 9:26 AM    [facility] [address] [place], [place], [address] Phone [phone], Fax [phone]

## 2022-12-10 ENCOUNTER — Other Ambulatory Visit: Payer: Self-pay | Admitting: Cardiology

## 2022-12-10 ENCOUNTER — Ambulatory Visit: Payer: PRIVATE HEALTH INSURANCE | Attending: Cardiology | Admitting: Cardiology

## 2022-12-10 ENCOUNTER — Encounter: Payer: Self-pay | Admitting: Cardiology

## 2022-12-10 VITALS — BP 136/54 | HR 72 | Ht 67.0 in | Wt 148.0 lb

## 2022-12-10 DIAGNOSIS — I10 Essential (primary) hypertension: Secondary | ICD-10-CM | POA: Diagnosis not present

## 2022-12-10 DIAGNOSIS — G4733 Obstructive sleep apnea (adult) (pediatric): Secondary | ICD-10-CM | POA: Diagnosis not present

## 2022-12-10 DIAGNOSIS — I5031 Acute diastolic (congestive) heart failure: Secondary | ICD-10-CM

## 2022-12-10 DIAGNOSIS — I4819 Other persistent atrial fibrillation: Secondary | ICD-10-CM

## 2022-12-10 MED ORDER — FUROSEMIDE 40 MG PO TABS: ORAL_TABLET | ORAL | 3 refills | Status: DC

## 2022-12-10 NOTE — Patient Instructions (Signed)
Medication Instructions:   REDUCE FUROSEMIDE TO 40 MG IN THE MORNING AND 20 MG IN THE AFTERNOON= 1 TABLET IN THE MORNING AND 1/2 TABLET AROUND NOON  STOP MELOXICAM  *If you need a refill on your cardiac medications before your next appointment, please call your pharmacy*   Testing/Procedures:  You are scheduled for a TEE (Transesophageal Echocardiogram) Guided Cardioversion on Tuesday, March 5 with Dr. Stanford Breed.  Please arrive at the [facility] (Main Entrance A) at [facility]: [address] [place], [place] [address] at 8:30 AM.    DIET:  Nothing to eat or drink after midnight except a sip of water with medications (see medication instructions below)  MEDICATION INSTRUCTIONS:  DO NOT TAKE FUROSEMIDE TUESDAY MORNING  TAKE ALL OTHER MEDICATIONS WITH SIPS OF WATER  FYI:  For your safety, and to allow [place] to monitor your vital signs accurately during the surgery/procedure we request: If you have artificial nails, gel coating, SNS etc, please have those removed prior to your surgery/procedure. Not having the nail coverings /polish removed may result in cancellation or delay of your surgery/procedure.  You must have a responsible person to drive you home and stay in the waiting area during your procedure. Failure to do so could result in cancellation.  Bring your insurance cards.  *Special Note: Every effort is made to have your procedure done on time. Occasionally there are emergencies that occur at the hospital that may cause delays. Please be patient if a delay does occur.     Follow-Up: At [facility], you and your health needs are our priority.  As part of our continuing mission to provide you with exceptional heart care, we have created designated Provider Care Teams.  These Care Teams include your primary Cardiologist (physician) and Advanced Practice Providers (APPs -  Physician Assistants and Nurse Practitioners) who all work together to provide you with the  care you need, when you need it.  We recommend signing up for the patient portal called "MyChart".  Sign up information is provided on this After Visit Summary.  MyChart is used to connect with patients for Virtual Visits (Telemedicine).  Patients are able to view lab/test results, encounter notes, upcoming appointments, etc.  Non-urgent messages can be sent to your provider as well.   To learn more about what you can do with MyChart, go to [email or web address].    Your next appointment:    12/22/22 @ 4:40 PM

## 2022-12-11 LAB — BASIC METABOLIC PANEL
BUN/Creatinine Ratio: 14 (ref 10–24)
BUN: 29 mg/dL — ABNORMAL HIGH (ref 8–27)
CO2: 25 mmol/L (ref 20–29)
Calcium: 9.2 mg/dL (ref 8.6–10.2)
Chloride: 101 mmol/L (ref 96–106)
Creatinine, Ser: 2.12 mg/dL — ABNORMAL HIGH (ref 0.76–1.27)
Glucose: 74 mg/dL (ref 70–99)
Potassium: 4.4 mmol/L (ref 3.5–5.2)
Sodium: 142 mmol/L (ref 134–144)
eGFR: 30 mL/min/{1.73_m2} — ABNORMAL LOW (ref >59)

## 2022-12-11 LAB — CBC
Hematocrit: 35 % — ABNORMAL LOW (ref 37.5–51.0)
Hemoglobin: 11.1 g/dL — ABNORMAL LOW (ref 13.0–17.7)
MCH: 29.8 pg (ref 26.6–33.0)
MCHC: 31.7 g/dL (ref 31.5–35.7)
MCV: 94 fL (ref 79–97)
Platelets: 179 10*3/uL (ref 150–450)
RBC: 3.73 x10E6/uL — ABNORMAL LOW (ref 4.14–5.80)
RDW: 13.1 % (ref 11.6–15.4)
WBC: 4.3 10*3/uL (ref 3.4–10.8)

## 2022-12-11 LAB — PRO B NATRIURETIC PEPTIDE: NT-Pro BNP: 2546 pg/mL — ABNORMAL HIGH (ref 0–486)

## 2022-12-12 ENCOUNTER — Encounter (HOSPITAL_COMMUNITY): Payer: Self-pay | Admitting: Internal Medicine

## 2022-12-12 ENCOUNTER — Ambulatory Visit (HOSPITAL_COMMUNITY)
Admission: RE | Admit: 2022-12-12 | Discharge: 2022-12-12 | Disposition: A | Discharge status: Home / Self Care | Payer: Medicare HMO | Source: Ambulatory Visit | Attending: Internal Medicine | Admitting: Internal Medicine

## 2022-12-12 VITALS — BP 106/58 | HR 68 | Wt 144.6 lb

## 2022-12-12 DIAGNOSIS — N179 Acute kidney failure, unspecified: Secondary | ICD-10-CM | POA: Insufficient documentation

## 2022-12-12 DIAGNOSIS — G4733 Obstructive sleep apnea (adult) (pediatric): Secondary | ICD-10-CM | POA: Insufficient documentation

## 2022-12-12 DIAGNOSIS — N1832 Chronic kidney disease, stage 3b: Secondary | ICD-10-CM | POA: Insufficient documentation

## 2022-12-12 DIAGNOSIS — I130 Hypertensive heart and chronic kidney disease with heart failure and stage 1 through stage 4 chronic kidney disease, or unspecified chronic kidney disease: Secondary | ICD-10-CM | POA: Diagnosis not present

## 2022-12-12 DIAGNOSIS — I5031 Acute diastolic (congestive) heart failure: Secondary | ICD-10-CM | POA: Insufficient documentation

## 2022-12-12 DIAGNOSIS — I480 Paroxysmal atrial fibrillation: Secondary | ICD-10-CM | POA: Diagnosis not present

## 2022-12-12 DIAGNOSIS — R7989 Other specified abnormal findings of blood chemistry: Secondary | ICD-10-CM | POA: Insufficient documentation

## 2022-12-12 DIAGNOSIS — Z79899 Other long term (current) drug therapy: Secondary | ICD-10-CM | POA: Diagnosis not present

## 2022-12-12 DIAGNOSIS — Z7901 Long term (current) use of anticoagulants: Secondary | ICD-10-CM | POA: Insufficient documentation

## 2022-12-12 DIAGNOSIS — G2581 Restless legs syndrome: Secondary | ICD-10-CM | POA: Insufficient documentation

## 2022-12-12 LAB — BRAIN NATRIURETIC PEPTIDE: B Natriuretic Peptide: 719.4 pg/mL — ABNORMAL HIGH (ref 0.0–100.0)

## 2022-12-12 LAB — BASIC METABOLIC PANEL
Anion gap: 12 (ref 5–15)
BUN: 25 mg/dL — ABNORMAL HIGH (ref 8–23)
CO2: 26 mmol/L (ref 22–32)
Calcium: 8.9 mg/dL (ref 8.9–10.3)
Chloride: 99 mmol/L (ref 98–111)
Creatinine, Ser: 1.94 mg/dL — ABNORMAL HIGH (ref 0.61–1.24)
GFR, Estimated: 33 mL/min — ABNORMAL LOW (ref >60)
Glucose, Bld: 96 mg/dL (ref 70–99)
Potassium: 4.1 mmol/L (ref 3.5–5.1)
Sodium: 137 mmol/L (ref 135–145)

## 2022-12-12 LAB — TSH: TSH: 1.595 u[IU]/mL (ref 0.350–4.500)

## 2022-12-12 NOTE — Progress Notes (Signed)
ReDS Vest / Clip - 12/12/22 1200       ReDS Vest / Clip   Station Marker C    Ruler Value 26    ReDS Value Range High volume overload    ReDS Actual Value 41

## 2022-12-12 NOTE — Progress Notes (Signed)
ADVANCED HF CLINIC CONSULT NOTE  Referring Physician: Prince Solian, MD Primary Care: Charles Solian, MD Primary Cardiologist: Charles Martinique, MD  HPI:  Charles Charles is a 88 y.o. male who is seen for follow up. I care for his wife Charles Charles and he is Dr Charles Charles father in law. He has a history of HTN and OSA on CPAP. Prior Myoview in 2010 was normal. He had a cardiac cath in 2018 showing normal coronaries and normal right heart hemodynamics. Echo in 2018 showed mild MR-otherwise normal. He is followed by Dr Charles Charles for OSA. Has restless leg syndrome. Prior arterial dopplers normal.    He was seen 3 weeks ago by Dr Charles Charles for general physical and was doing well. About 9 days ago he developed worsening SOB and edema. Noted dry cough. Was seen back at Dr Charles Charles office Charles Agent, NP) and found to be in AFib with rate 110s. Was placed on Eliquis and metoprolol. Lasix dose was increased to 80 mg daily from 40 mg daily. On follow up Monday creatinine had increased from 1.4 to 1.9 so lasix was reduced to 40 mg daily. CXR done. Was placed on antibiotics for possible PNA.   Swelling has improved but he still complains of SOB at rest, orthopnea. Weight did go up to 152 but has improved. He denies any palpitations. Does note slight chest pain at times.   Was seen by Dr. Martinique earlier this week. Lasix increased to 40/20. Scr now 2.1   Still SOB with mild exertion. Breathing somewhat improved Unable to walk to the mailbox. No orthopnea or PND. Mild needlelike CP.   Started feeling bad on Thursday noted he was SOB with    Echo 6/18: EF 55-60% RV normal RVSP 26mHG    Review of Systems: [y] = yes, '[ ]'$  = no   General: Weight gain '[ ]'$ ; Weight loss '[ ]'$ ; Anorexia '[ ]'$ ; Fatigue [ y]; Fever '[ ]'$ ; Chills '[ ]'$ ; Weakness '[ ]'$   Cardiac: Chest pain/pressure '[ ]'$ ; Resting SOB [ y]; Exertional SOB [ y]; Orthopnea '[ ]'$ ; Pedal Edema [ y]; Palpitations '[ ]'$ ; Syncope '[ ]'$ ; Presyncope '[ ]'$ ; Paroxysmal nocturnal  dyspnea'[ ]'$   Pulmonary: Cough '[ ]'$ ; Wheezing'[ ]'$ ; Hemoptysis'[ ]'$ ; Sputum '[ ]'$ ; Snoring '[ ]'$   GI: Vomiting'[ ]'$ ; Dysphagia'[ ]'$ ; Melena'[ ]'$ ; Hematochezia '[ ]'$ ; Heartburn'[ ]'$ ; Abdominal pain '[ ]'$ ; Constipation '[ ]'$ ; Diarrhea '[ ]'$ ; BRBPR '[ ]'$   GU: Hematuria'[ ]'$ ; Dysuria '[ ]'$ ; Nocturia'[ ]'$   Vascular: Pain in legs with walking '[ ]'$ ; Pain in feet with lying flat '[ ]'$ ; Non-healing sores '[ ]'$ ; Stroke '[ ]'$ ; TIA '[ ]'$ ; Slurred speech '[ ]'$ ;  Neuro: Headaches'[ ]'$ ; Vertigo'[ ]'$ ; Seizures'[ ]'$ ; Paresthesias'[ ]'$ ;Blurred vision '[ ]'$ ; Diplopia '[ ]'$ ; Vision changes '[ ]'$   Ortho/Skin: Arthritis [Blue.Reese]; Joint pain [Blue.Reese]; Muscle pain '[ ]'$ ; Joint swelling '[ ]'$ ; Back Pain '[ ]'$ ; Rash '[ ]'$   Psych: Depression'[ ]'$ ; Anxiety'[ ]'$   Heme: Bleeding problems '[ ]'$ ; Clotting disorders '[ ]'$ ; Anemia '[ ]'$   Endocrine: Diabetes '[ ]'$ ; Thyroid dysfunction'[ ]'$    Past Medical History:  Diagnosis Date   Anemia    Arthritis    osteoarthritis  back   Celiac disease    Chronic kidney disease    Collagenous colitis    Diverticulosis    Enlarged prostate    followed by dr  dDorina Hoyer  Esophageal dysmotility    Esophageal dysmotility    Esophageal stenosis    Essential hypertension, benign  03/29/2014   External hemorrhoids    External hemorrhoids    Full dentures    GERD (gastroesophageal reflux disease)    Hemorrhoids    Hiatal hernia    IBS (irritable bowel syndrome)    Internal hemorrhoids    OSA on CPAP 10/17/2018   Osteoporosis    Postoperative anemia due to acute blood loss 03/29/2014   Schatzki's ring    Sleep apnea    CPAP Machine   Ulcer     Current Outpatient Medications  Medication Sig Dispense Refill   acetaminophen (TYLENOL) 650 MG CR tablet Take 650 mg by mouth every evening.     albuterol (PROVENTIL HFA;VENTOLIN HFA) 108 (90 Base) MCG/ACT inhaler Inhale 2 puffs into the lungs every 6 (six) hours as needed for wheezing or shortness of breath.     alendronate (FOSAMAX) 70 MG tablet Take 70 mg by mouth every Monday.     amLODipine (NORVASC) 10 MG tablet Take 10  mg by mouth every evening.     cefdinir (OMNICEF) 300 MG capsule Take 300 mg by mouth 2 (two) times daily.     diclofenac Sodium (VOLTAREN) 1 % GEL Apply 2 g topically 4 (four) times daily as needed (pain.).     dicyclomine (BENTYL) 10 MG capsule Take 10 mg by mouth 2 (two) times daily as needed (abdominal pain/spasms.).     DOCUSATE SODIUM PO Take 100 mg by mouth daily as needed.     doxazosin (CARDURA) 8 MG tablet Take 8 mg by mouth every evening.     ELIQUIS 5 MG TABS tablet Take 5 mg by mouth 2 (two) times daily.     famotidine (PEPCID) 40 MG tablet Take 1 tablet (40 mg total) by mouth 2 (two) times daily. 180 tablet 2   ferrous sulfate 325 (65 FE) MG tablet Take 325 mg by mouth at bedtime.     furosemide (LASIX) 40 MG tablet TAKE 40 MG IN THE MORNING AND 20 MG IN THE EVENING 135 tablet 3   gabapentin (NEURONTIN) 600 MG tablet Take 600 mg by mouth at bedtime.     HYDROcodone-acetaminophen (NORCO) 5-325 MG per tablet Take 1-2 tablets by mouth every 6 (six) hours as needed. 90 tablet 0   loratadine (CLARITIN) 10 MG tablet Take 10 mg by mouth in the morning.     magnesium oxide (MAG-OX) 400 (240 Mg) MG tablet Take 400 mg by mouth in the morning.     metoprolol tartrate (LOPRESSOR) 25 MG tablet Take 12.5 mg by mouth 2 (two) times daily.     montelukast (SINGULAIR) 10 MG tablet Take 10 mg by mouth in the morning.     Polyethyl Glycol-Propyl Glycol (SYSTANE OP) Place 1 drop into both eyes 3 (three) times daily as needed (dry/irritated eyes.).     potassium chloride SA (KLOR-CON M) 20 MEQ tablet Take 20 mEq by mouth daily with supper.     sertraline (ZOLOFT) 50 MG tablet Take 25 mg by mouth at bedtime.     tiZANidine (ZANAFLEX) 4 MG capsule Take 4 mg by mouth as needed for muscle spasms.     zolpidem (AMBIEN) 10 MG tablet Take 5 mg by mouth at bedtime as needed for sleep.     No current facility-administered medications for this encounter.    Allergies  Allergen Reactions   Wheat Bran Other  (See Comments)    Celiac Disease      Social History   Socioeconomic History   Marital status: Married  Spouse name: Not on file   Number of children: 3   Years of education: Not on file   Highest education level: Not on file  Occupational History   Occupation: retired  Tobacco Use   Smoking status: Former    Types: Cigarettes    Quit date: 08/11/1981    Years since quitting: 41.3   Smokeless tobacco: Never  Vaping Use   Vaping Use: Never used  Substance and Sexual Activity   Alcohol use: Yes    Comment: ocassionally   Drug use: No   Sexual activity: Not on file  Other Topics Concern   Not on file  Social History Narrative   Not on file   Social Determinants of Health   Financial Resource Strain: Not on file  Food Insecurity: Not on file  Transportation Needs: Not on file  Physical Activity: Not on file  Stress: Not on file  Social Connections: Not on file  Intimate Partner Violence: Not on file      Family History  Problem Relation Age of Onset   Cervical cancer Mother    Colon cancer Mother    Stroke Father    Heart attack Father    Breast cancer Sister    Throat cancer Brother    Liver cancer Brother    Brain cancer Brother    Esophageal cancer Neg Hx    Rectal cancer Neg Hx    Stomach cancer Neg Hx     Vitals:   12/12/22 1128  BP: (!) 106/58  Pulse: 68  SpO2: 90%  Weight: 65.6 kg (144 lb 9.6 oz)    PHYSICAL EXAM: General:  Elderly Well appearing. No respiratory difficulty HEENT: normal Neck: supple. no JVD. Carotids 2+ bilat; no bruits. No lymphadenopathy or thryomegaly appreciated. Cor: PMI nondisplaced. Irregular rate & rhythm. No rubs, gallops or murmurs. Lungs: clear Abdomen: soft, nontender, nondistended. No hepatosplenomegaly. No bruits or masses. Good bowel sounds. Extremities: no cyanosis, clubbing, rash, 2+ edema Neuro: alert & oriented x 3, cranial nerves grossly intact. moves all 4 extremities w/o difficulty. Affect  pleasant.  ECG: AF 76 narrow QRS   ASSESSMENT & PLAN:  1. Acute diastolic HF in setting of new-onset AF - Bedside echo today in clinic EF 60-65% - volume overloaded and not responding to po lasix - will give 2 doses of Furoscix - Increase lasix to 60 daily - Check labs  2. PAF - new-onset - rate controlled but not tolerating well - scheduled for TEE/DC-CV on Tuesday with Dr. Stanford Breed - Continue Eliquis - If has ERAF will need AAD (?flecainide)  3. OSA - continue CPAP - follow with Dr. Radford Prince to make sure under good control  4. AKI on CKD 3b - likely cardiorenal - repeat labs today  Glori Bickers, MD  12:13 PM

## 2022-12-12 NOTE — Patient Instructions (Addendum)
Good to see you today!  Take Furoscix  as directed for 2 days  Your provider has order Furoscix for you. This is an on-body infuser that gives you a dose of Furosemide.   It will be shipped to your home   [employer] will call you to discuss before shipping so, PLEASE answer unknown calls  For questions regarding the device call [employer] at [phone]  Ensure you write down the time you start your infusion so that if there is a problem you will know how long the infusion lasted  Use Furoscix only AS DIRECTED by our office  Dosing Directions:   Day 1=  Day 2=  Day 3=   Labs done today, your results will be available in MyChart, we will contact you for abnormal readings.  Your physician recommends that you schedule a follow-up appointment in: 1 month  If you have any questions or concerns before your next appointment please send [place] a message through [place] or call our office at [phone].    TO LEAVE A MESSAGE FOR THE NURSE SELECT OPTION 2, PLEASE LEAVE A MESSAGE INCLUDING: YOUR NAME DATE OF BIRTH CALL BACK NUMBER REASON FOR CALL**this is important as we prioritize the call backs  YOU WILL RECEIVE A CALL BACK THE SAME DAY AS LONG AS YOU CALL BEFORE 4:00 PM  At the [facility], you and your health needs are our priority. As part of our continuing mission to provide you with exceptional heart care, we have created designated Provider Care Teams. These Care Teams include your primary Cardiologist (physician) and Advanced Practice Providers (APPs- Physician Assistants and Nurse Practitioners) who all work together to provide you with the care you need, when you need it.   You may see any of the following providers on your designated Care Team at your next follow up: Dr Glori Bickers Dr Loralie Champagne Dr. Roxana Hires, NP Lyda Jester, [place] [facility] [place], [place] Forestine Na, NP Audry Riles,  PharmD   Please be sure to bring in all your medications bottles to every appointment.    Thank you for choosing [facility]

## 2022-12-12 NOTE — Progress Notes (Signed)
Medication Samples have been provided to the patient.  Drug name: furoscix       Strength: 80 mg        Qty: 2  [id number]:[id number]  Exp.Date: 08/12/2024  Dosing instructions: Take as directed by clinic  The patient has been instructed regarding the correct time, dose, and frequency of taking this medication, including desired effects and most common side effects.   Juanita Laster Willadean Guyton 1:08 PM 12/12/2022

## 2022-12-16 ENCOUNTER — Ambulatory Visit (HOSPITAL_BASED_OUTPATIENT_CLINIC_OR_DEPARTMENT_OTHER): Payer: Medicare HMO | Admitting: Certified Registered Nurse Anesthetist

## 2022-12-16 ENCOUNTER — Ambulatory Visit (HOSPITAL_BASED_OUTPATIENT_CLINIC_OR_DEPARTMENT_OTHER): Payer: Medicare HMO

## 2022-12-16 ENCOUNTER — Ambulatory Visit (HOSPITAL_COMMUNITY)
Admission: RE | Admit: 2022-12-16 | Discharge: 2022-12-16 | Disposition: A | Discharge status: Home / Self Care | Payer: Medicare HMO | Attending: Cardiology | Admitting: Cardiology

## 2022-12-16 ENCOUNTER — Ambulatory Visit (HOSPITAL_COMMUNITY): Payer: Medicare HMO | Admitting: Certified Registered Nurse Anesthetist

## 2022-12-16 ENCOUNTER — Encounter (HOSPITAL_COMMUNITY)
Admission: RE | Disposition: A | Discharge status: Home / Self Care | Payer: Self-pay | Source: Home / Self Care | Attending: Cardiology

## 2022-12-16 ENCOUNTER — Other Ambulatory Visit: Payer: Self-pay

## 2022-12-16 ENCOUNTER — Encounter (HOSPITAL_COMMUNITY): Payer: Self-pay | Admitting: Cardiology

## 2022-12-16 DIAGNOSIS — I4891 Unspecified atrial fibrillation: Secondary | ICD-10-CM | POA: Diagnosis not present

## 2022-12-16 DIAGNOSIS — I700 Atherosclerosis of aorta: Secondary | ICD-10-CM | POA: Insufficient documentation

## 2022-12-16 DIAGNOSIS — I4819 Other persistent atrial fibrillation: Secondary | ICD-10-CM

## 2022-12-16 DIAGNOSIS — I340 Nonrheumatic mitral (valve) insufficiency: Secondary | ICD-10-CM | POA: Diagnosis not present

## 2022-12-16 DIAGNOSIS — Z79899 Other long term (current) drug therapy: Secondary | ICD-10-CM | POA: Diagnosis not present

## 2022-12-16 DIAGNOSIS — I130 Hypertensive heart and chronic kidney disease with heart failure and stage 1 through stage 4 chronic kidney disease, or unspecified chronic kidney disease: Secondary | ICD-10-CM | POA: Diagnosis not present

## 2022-12-16 DIAGNOSIS — D649 Anemia, unspecified: Secondary | ICD-10-CM

## 2022-12-16 DIAGNOSIS — Z87891 Personal history of nicotine dependence: Secondary | ICD-10-CM | POA: Diagnosis not present

## 2022-12-16 DIAGNOSIS — I10 Essential (primary) hypertension: Secondary | ICD-10-CM

## 2022-12-16 DIAGNOSIS — I5031 Acute diastolic (congestive) heart failure: Secondary | ICD-10-CM | POA: Insufficient documentation

## 2022-12-16 DIAGNOSIS — G4733 Obstructive sleep apnea (adult) (pediatric): Secondary | ICD-10-CM | POA: Insufficient documentation

## 2022-12-16 DIAGNOSIS — N1832 Chronic kidney disease, stage 3b: Secondary | ICD-10-CM | POA: Diagnosis not present

## 2022-12-16 DIAGNOSIS — Z7901 Long term (current) use of anticoagulants: Secondary | ICD-10-CM | POA: Insufficient documentation

## 2022-12-16 DIAGNOSIS — N179 Acute kidney failure, unspecified: Secondary | ICD-10-CM | POA: Insufficient documentation

## 2022-12-16 DIAGNOSIS — G2581 Restless legs syndrome: Secondary | ICD-10-CM | POA: Diagnosis not present

## 2022-12-16 HISTORY — PX: CARDIOVERSION: SHX1299

## 2022-12-16 HISTORY — PX: TEE WITHOUT CARDIOVERSION: SHX5443

## 2022-12-16 LAB — ECHO TEE
MV M vel: 3.88 m/s
MV Peak grad: 60.1 mmHg
Radius: 0.55 cm

## 2022-12-16 SURGERY — ECHOCARDIOGRAM, TRANSESOPHAGEAL
Anesthesia: Monitor Anesthesia Care

## 2022-12-16 MED ORDER — EPHEDRINE SULFATE-NACL 50-0.9 MG/10ML-% IV SOSY
PREFILLED_SYRINGE | INTRAVENOUS | Status: DC | PRN
  Administered 2022-12-16 (×2): 5 mg via INTRAVENOUS

## 2022-12-16 MED ORDER — PROPOFOL 10 MG/ML IV BOLUS
INTRAVENOUS | Status: DC | PRN
  Administered 2022-12-16: 30 mg via INTRAVENOUS

## 2022-12-16 MED ORDER — PROPOFOL 500 MG/50ML IV EMUL
INTRAVENOUS | Status: DC | PRN
  Administered 2022-12-16: 125 ug/kg/min via INTRAVENOUS

## 2022-12-16 MED ORDER — LIDOCAINE 2% (20 MG/ML) 5 ML SYRINGE
INTRAMUSCULAR | Status: DC | PRN
  Administered 2022-12-16: 80 mg via INTRAVENOUS

## 2022-12-16 MED ORDER — PHENYLEPHRINE 80 MCG/ML (10ML) SYRINGE FOR IV PUSH (FOR BLOOD PRESSURE SUPPORT)
PREFILLED_SYRINGE | INTRAVENOUS | Status: DC | PRN
  Administered 2022-12-16: 80 ug via INTRAVENOUS
  Administered 2022-12-16: 40 ug via INTRAVENOUS
  Administered 2022-12-16: 80 ug via INTRAVENOUS

## 2022-12-16 MED ORDER — SODIUM CHLORIDE 0.9 % IV SOLN: INTRAVENOUS | Status: DC

## 2022-12-16 MED ORDER — PHENYLEPHRINE HCL-NACL 20-0.9 MG/250ML-% IV SOLN
INTRAVENOUS | Status: DC | PRN
  Administered 2022-12-16: 75 ug/min via INTRAVENOUS

## 2022-12-16 NOTE — Transfer of Care (Signed)
Immediate Anesthesia Transfer of Care Note  Patient: [name]  Procedure(s) Performed: TRANSESOPHAGEAL ECHOCARDIOGRAM (TEE) CARDIOVERSION  Patient Location: Endoscopy Unit  Anesthesia Type:MAC  Level of Consciousness: awake, alert , and oriented  Airway & Oxygen Therapy: Patient Spontanous Breathing and Patient connected to nasal cannula oxygen  Post-op Assessment: Report given to RN and Post -op Vital signs reviewed and stable  Post vital signs: Reviewed and stable  Last Vitals:  Vitals Value Taken Time  BP    Temp    Pulse    Resp    SpO2      Last Pain:  Vitals:   12/16/22 0857  TempSrc: Tympanic  PainSc: 0-No pain         Complications: No notable events documented.

## 2022-12-16 NOTE — Anesthesia Preprocedure Evaluation (Addendum)
Anesthesia Evaluation  Patient identified by MRN, date of birth, ID band Patient awake    Reviewed: Allergy & Precautions, NPO status , Patient's Chart, lab work & pertinent test results, reviewed documented beta blocker date and time   Airway Mallampati: I       Dental  (+) Poor Dentition, Partial Upper, Lower Dentures   Pulmonary former smoker   Pulmonary exam normal        Cardiovascular hypertension, Pt. on medications and Pt. on home beta blockers Normal cardiovascular exam     Neuro/Psych    GI/Hepatic hiatal hernia, PUD,GERD  Medicated,,  Endo/Other    Renal/GU      Musculoskeletal   Abdominal Normal abdominal exam  (+)   Peds  Hematology  (+) Blood dyscrasia, anemia   Anesthesia Other Findings   Reproductive/Obstetrics                             Anesthesia Physical Anesthesia Plan  ASA: 2  Anesthesia Plan: MAC   Post-op Pain Management:    Induction: Intravenous  PONV Risk Score and Plan: Propofol infusion, TIVA and Treatment may vary due to age or medical condition  Airway Management Planned: Natural Airway and Mask  Additional Equipment: TEE  Intra-op Plan:   Post-operative Plan:   Informed Consent: I have reviewed the patients History and Physical, chart, labs and discussed the procedure including the risks, benefits and alternatives for the proposed anesthesia with the patient or authorized representative who has indicated his/her understanding and acceptance.     Dental advisory given  Plan Discussed with: CRNA  Anesthesia Plan Comments:        Anesthesia Quick Evaluation

## 2022-12-16 NOTE — Anesthesia Postprocedure Evaluation (Signed)
Anesthesia Post Note  Patient: [name]  Procedure(s) Performed: TRANSESOPHAGEAL ECHOCARDIOGRAM (TEE) CARDIOVERSION     Patient location during evaluation: Endoscopy Anesthesia Type: MAC Level of consciousness: awake Pain management: pain level controlled Vital Signs Assessment: post-procedure vital signs reviewed and stable Respiratory status: spontaneous breathing Cardiovascular status: stable Postop Assessment: no apparent nausea or vomiting Anesthetic complications: no  No notable events documented.  Last Vitals:  Vitals:   12/16/22 1010 12/16/22 1020  BP: (!) 100/51 (!) 96/52  Pulse: (!) 57 (!) 52  Resp: 16 15  Temp:    SpO2: 93% 92%    Last Pain:  Vitals:   12/16/22 1020  TempSrc:   PainSc: 0-No pain                 John F Salome Arnt

## 2022-12-16 NOTE — Anesthesia Procedure Notes (Signed)
Procedure Name: MAC Date/Time: 12/16/2022 9:14 AM  Performed by: Valda Favia, CRNAPre-anesthesia Checklist: Patient identified, Emergency Drugs available, Suction available, Patient being monitored and Timeout performed Patient Re-evaluated:Patient Re-evaluated prior to induction Oxygen Delivery Method: Nasal cannula Preoxygenation: Pre-oxygenation with 100% oxygen Induction Type: IV induction Placement Confirmation: positive ETCO2 Dental Injury: Teeth and Oropharynx as per pre-operative assessment

## 2022-12-16 NOTE — Interval H&P Note (Signed)
History and Physical Interval Note:  12/16/2022 8:42 AM  [name]  has presented today for surgery, with the diagnosis of AFIB.  The various methods of treatment have been discussed with the patient and family. After consideration of risks, benefits and other options for treatment, the patient has consented to  Procedure(s): TRANSESOPHAGEAL ECHOCARDIOGRAM (TEE) (N/A) CARDIOVERSION (N/A) as a surgical intervention.  The patient's history has been reviewed, patient examined, no change in status, stable for surgery.  I have reviewed the patient's chart and labs.  Questions were answered to the patient's satisfaction.     Kirk Ruths

## 2022-12-16 NOTE — Discharge Instructions (Signed)
TEE  YOU HAD AN CARDIAC PROCEDURE TODAY: Refer to the procedure report and other information in the discharge instructions given to you for any specific questions about what was found during the examination. If this information does not answer your questions, please call [place] office at [phone] to clarify.   DIET: Your first meal following the procedure should be a light meal and then it is ok to progress to your normal diet. A half-sandwich or bowl of soup is an example of a good first meal. Heavy or fried foods are harder to digest and may make you feel nauseous or bloated. Drink plenty of fluids but you should avoid alcoholic beverages for 24 hours. If you had a esophageal dilation, please see attached instructions for diet.   ACTIVITY: Your care partner should take you home directly after the procedure. You should plan to take it easy, moving slowly for the rest of the day. You can resume normal activity the day after the procedure however YOU SHOULD NOT DRIVE, use power tools, machinery or perform tasks that involve climbing or major physical exertion for 24 hours (because of the sedation medicines used during the test).   SYMPTOMS TO REPORT IMMEDIATELY: A cardiologist can be reached at any hour. Please call [phone] for any of the following symptoms:  Vomiting of blood or coffee ground material  New, significant abdominal pain  New, significant chest pain or pain under the shoulder blades  Painful or persistently difficult swallowing  New shortness of breath  Black, tarry-looking or red, bloody stools  FOLLOW UP:  Please also call with any specific questions about appointments or follow up tests. Electrical Cardioversion Electrical cardioversion is the delivery of a jolt of electricity to restore a normal rhythm to the heart. A rhythm that is too fast or is not regular keeps the heart from pumping well. In this procedure, sticky patches or metal paddles are placed on the  chest to deliver electricity to the heart from a device. This procedure may be done in an emergency if: There is low or no blood pressure as a result of the heart rhythm. Normal rhythm must be restored as fast as possible to protect the brain and heart from further damage. It may save a life. This may also be a scheduled procedure for irregular or fast heart rhythms that are not immediately life-threatening.  What can I expect after the procedure? Your blood pressure, heart rate, breathing rate, and blood oxygen level will be monitored until you leave the hospital or clinic. Your heart rhythm will be watched to make sure it does not change. You may have some redness on the skin where the shocks were given. Over the counter cortizone cream may be helpful.  Follow these instructions at home: Do not drive for 24 hours if you were given a sedative during your procedure. Take over-the-counter and prescription medicines only as told by your health care provider. Ask your health care provider how to check your pulse. Check it often. Rest for 48 hours after the procedure or as told by your health care provider. Avoid or limit your caffeine use as told by your health care provider. Keep all follow-up visits as told by your health care provider. This is important. Contact a health care provider if: You feel like your heart is beating too quickly or your pulse is not regular. You have a serious muscle cramp that does not go away. Get help right away if: You have discomfort in your  chest. You are dizzy or you feel faint. You have trouble breathing or you are short of breath. Your speech is slurred. You have trouble moving an arm or leg on one side of your body. Your fingers or toes turn cold or blue. Summary Electrical cardioversion is the delivery of a jolt of electricity to restore a normal rhythm to the heart. This procedure may be done right away in an emergency or may be a scheduled procedure  if the condition is not an emergency. Generally, this is a safe procedure. After the procedure, check your pulse often as told by your health care provider. This information is not intended to replace advice given to you by your health care provider. Make sure you discuss any questions you have with your health care provider. Document Revised: 05/02/2019 Document Reviewed: 05/02/2019 Elsevier Patient Education  [place].

## 2022-12-16 NOTE — Progress Notes (Signed)
     Transesophageal Echocardiogram Note  RAND FORSETH [id number] [birth year]  Procedure: Transesophageal Echocardiogram Indications: Atrial fibrillation  Procedure Details Consent: Obtained Time Out: Verified patient identification, verified procedure, site/side was marked, verified correct patient position, special equipment/implants available, Radiology Safety Procedures followed,  medications/allergies/relevent history reviewed, required imaging and test results available.  Performed  Medications:  Pt sedated by anesthesia with diprovan 400 mg IV total.  Normal LV function; moderate biatrial enlargement; no LAA thrombus; mild RV dysfunction; severe MR; mild TR.  Pt subsequently had DCCV with 150J to sinus rhythm. No immediate complications.   Complications: No apparent complications Patient did tolerate procedure well.  Charles Ruths, MD

## 2022-12-16 NOTE — H&P (Signed)
Bensimhon, Shaune Pascal, MD  Physician Cardiology   Progress Notes    Signed   Date of Service: 12/12/2022 11:20 AM  Related encounter: New Patient Appointment from 12/12/2022 in [facility] [place]   Signed         ADVANCED HF CLINIC CONSULT NOTE   Referring Physician: Prince Solian, MD Primary Care: Prince Solian, MD Primary Cardiologist: Peter Martinique, MD   HPI:   [name] is a 88 y.o. male who is seen for follow up. I care for his wife [name] and he is Dr Vivi Martens father in law. He has a history of HTN and OSA on CPAP. Prior Myoview in 2010 was normal. He had a cardiac cath in 2018 showing normal coronaries and normal right heart hemodynamics. Echo in 2018 showed mild MR-otherwise normal. He is followed by Dr Radford Pax for OSA. Has restless leg syndrome. Prior arterial dopplers normal.    He was seen 3 weeks ago by Dr Dagmar Hait for general physical and was doing well. About 9 days ago he developed worsening SOB and edema. Noted dry cough. Was seen back at Dr Danna Hefty office Reginold Agent, NP) and found to be in AFib with rate 110s. Was placed on Eliquis and metoprolol. Lasix dose was increased to 80 mg daily from 40 mg daily. On follow up Monday creatinine had increased from 1.4 to 1.9 so lasix was reduced to 40 mg daily. CXR done. Was placed on antibiotics for possible PNA.    Swelling has improved but he still complains of SOB at rest, orthopnea. Weight did go up to 152 but has improved. He denies any palpitations. Does note slight chest pain at times.    Was seen by Dr. Martinique earlier this week. Lasix increased to 40/20. Scr now 2.1    Still SOB with mild exertion. Breathing somewhat improved Unable to walk to the mailbox. No orthopnea or PND. Mild needlelike CP.    Started feeling bad on Thursday noted he was SOB with     Echo 6/18: EF 55-60% RV normal RVSP 25mHG      Review of Systems: [y] = yes, '[ ]'$  = no    General: Weight gain  '[ ]'$ ; Weight loss '[ ]'$ ; Anorexia '[ ]'$ ; Fatigue [ y]; Fever '[ ]'$ ; Chills '[ ]'$ ; Weakness '[ ]'$   Cardiac: Chest pain/pressure '[ ]'$ ; Resting SOB [ y]; Exertional SOB [ y]; Orthopnea '[ ]'$ ; Pedal Edema [ y]; Palpitations '[ ]'$ ; Syncope '[ ]'$ ; Presyncope '[ ]'$ ; Paroxysmal nocturnal dyspnea'[ ]'$   Pulmonary: Cough '[ ]'$ ; Wheezing'[ ]'$ ; Hemoptysis'[ ]'$ ; Sputum '[ ]'$ ; Snoring '[ ]'$   GI: Vomiting'[ ]'$ ; Dysphagia'[ ]'$ ; Melena'[ ]'$ ; Hematochezia '[ ]'$ ; Heartburn'[ ]'$ ; Abdominal pain '[ ]'$ ; Constipation '[ ]'$ ; Diarrhea '[ ]'$ ; BRBPR '[ ]'$   GU: Hematuria'[ ]'$ ; Dysuria '[ ]'$ ; Nocturia'[ ]'$   Vascular: Pain in legs with walking '[ ]'$ ; Pain in feet with lying flat '[ ]'$ ; Non-healing sores '[ ]'$ ; Stroke '[ ]'$ ; TIA '[ ]'$ ; Slurred speech '[ ]'$ ;  Neuro: Headaches'[ ]'$ ; Vertigo'[ ]'$ ; Seizures'[ ]'$ ; Paresthesias'[ ]'$ ;Blurred vision '[ ]'$ ; Diplopia '[ ]'$ ; Vision changes '[ ]'$   Ortho/Skin: Arthritis [Blue.Reese]; Joint pain [Blue.Reese]; Muscle pain '[ ]'$ ; Joint swelling '[ ]'$ ; Back Pain '[ ]'$ ; Rash '[ ]'$   Psych: Depression'[ ]'$ ; Anxiety'[ ]'$   Heme: Bleeding problems '[ ]'$ ; Clotting disorders '[ ]'$ ; Anemia '[ ]'$   Endocrine: Diabetes '[ ]'$ ; Thyroid dysfunction'[ ]'$          Past Medical History:  Diagnosis Date   Anemia     Arthritis      osteoarthritis  back   Celiac disease     Chronic kidney disease     Collagenous colitis     Diverticulosis     Enlarged prostate      followed by dr  Dorina Hoyer   Esophageal dysmotility     Esophageal dysmotility     Esophageal stenosis     Essential hypertension, benign 03/29/2014   External hemorrhoids     External hemorrhoids     Full dentures     GERD (gastroesophageal reflux disease)     Hemorrhoids     Hiatal hernia     IBS (irritable bowel syndrome)     Internal hemorrhoids     OSA on CPAP 10/17/2018   Osteoporosis     Postoperative anemia due to acute blood loss 03/29/2014   Schatzki's ring     Sleep apnea      CPAP Machine   Ulcer              Current Outpatient Medications  Medication Sig Dispense Refill   acetaminophen (TYLENOL) 650 MG CR tablet Take 650 mg by mouth  every evening.       albuterol (PROVENTIL HFA;VENTOLIN HFA) 108 (90 Base) MCG/ACT inhaler Inhale 2 puffs into the lungs every 6 (six) hours as needed for wheezing or shortness of breath.       alendronate (FOSAMAX) 70 MG tablet Take 70 mg by mouth every Monday.       amLODipine (NORVASC) 10 MG tablet Take 10 mg by mouth every evening.       cefdinir (OMNICEF) 300 MG capsule Take 300 mg by mouth 2 (two) times daily.       diclofenac Sodium (VOLTAREN) 1 % GEL Apply 2 g topically 4 (four) times daily as needed (pain.).       dicyclomine (BENTYL) 10 MG capsule Take 10 mg by mouth 2 (two) times daily as needed (abdominal pain/spasms.).       DOCUSATE SODIUM PO Take 100 mg by mouth daily as needed.       doxazosin (CARDURA) 8 MG tablet Take 8 mg by mouth every evening.       ELIQUIS 5 MG TABS tablet Take 5 mg by mouth 2 (two) times daily.       famotidine (PEPCID) 40 MG tablet Take 1 tablet (40 mg total) by mouth 2 (two) times daily. 180 tablet 2   ferrous sulfate 325 (65 FE) MG tablet Take 325 mg by mouth at bedtime.       furosemide (LASIX) 40 MG tablet TAKE 40 MG IN THE MORNING AND 20 MG IN THE EVENING 135 tablet 3   gabapentin (NEURONTIN) 600 MG tablet Take 600 mg by mouth at bedtime.       HYDROcodone-acetaminophen (NORCO) 5-325 MG per tablet Take 1-2 tablets by mouth every 6 (six) hours as needed. 90 tablet 0   loratadine (CLARITIN) 10 MG tablet Take 10 mg by mouth in the morning.       magnesium oxide (MAG-OX) 400 (240 Mg) MG tablet Take 400 mg by mouth in the morning.       metoprolol tartrate (LOPRESSOR) 25 MG tablet Take 12.5 mg by mouth 2 (two) times daily.       montelukast (SINGULAIR) 10 MG tablet Take 10 mg by mouth in the morning.       Polyethyl Glycol-Propyl Glycol (SYSTANE OP) Place 1 drop into  both eyes 3 (three) times daily as needed (dry/irritated eyes.).       potassium chloride SA (KLOR-CON M) 20 MEQ tablet Take 20 mEq by mouth daily with supper.       sertraline (ZOLOFT) 50 MG  tablet Take 25 mg by mouth at bedtime.       tiZANidine (ZANAFLEX) 4 MG capsule Take 4 mg by mouth as needed for muscle spasms.       zolpidem (AMBIEN) 10 MG tablet Take 5 mg by mouth at bedtime as needed for sleep.        No current facility-administered medications for this encounter.           Allergies  Allergen Reactions   Wheat Bran Other (See Comments)      Celiac Disease        Social History         Socioeconomic History   Marital status: Married      Spouse name: Not on file   Number of children: 3   Years of education: Not on file   Highest education level: Not on file  Occupational History   Occupation: retired  Tobacco Use   Smoking status: Former      Types: Cigarettes      Quit date: 08/11/1981      Years since quitting: 41.3   Smokeless tobacco: Never  Vaping Use   Vaping Use: Never used  Substance and Sexual Activity   Alcohol use: Yes      Comment: ocassionally   Drug use: No   Sexual activity: Not on file  Other Topics Concern   Not on file  Social History Narrative   Not on file    Social Determinants of Health    Financial Resource Strain: Not on file  Food Insecurity: Not on file  Transportation Needs: Not on file  Physical Activity: Not on file  Stress: Not on file  Social Connections: Not on file  Intimate Partner Violence: Not on file             Family History  Problem Relation Age of Onset   Cervical cancer Mother     Colon cancer Mother     Stroke Father     Heart attack Father     Breast cancer Sister     Throat cancer Brother     Liver cancer Brother     Brain cancer Brother     Esophageal cancer Neg Hx     Rectal cancer Neg Hx     Stomach cancer Neg Hx           Vitals:    12/12/22 1128  BP: (!) 106/58  Pulse: 68  SpO2: 90%  Weight: 65.6 kg (144 lb 9.6 oz)      PHYSICAL EXAM: General:  Elderly Well appearing. No respiratory difficulty HEENT: normal Neck: supple. no JVD. Carotids 2+ bilat; no bruits. No  lymphadenopathy or thryomegaly appreciated. Cor: PMI nondisplaced. Irregular rate & rhythm. No rubs, gallops or murmurs. Lungs: clear Abdomen: soft, nontender, nondistended. No hepatosplenomegaly. No bruits or masses. Good bowel sounds. Extremities: no cyanosis, clubbing, rash, 2+ edema Neuro: alert & oriented x 3, cranial nerves grossly intact. moves all 4 extremities w/o difficulty. Affect pleasant.   ECG: AF 76 narrow QRS     ASSESSMENT & PLAN:   1. Acute diastolic HF in setting of new-onset AF - Bedside echo today in clinic EF 60-65% - volume overloaded and not responding to po  lasix - will give 2 doses of Furoscix - Increase lasix to 60 daily - Check labs   2. PAF - new-onset - rate controlled but not tolerating well - scheduled for TEE/DC-CV on Tuesday with Dr. Stanford Breed - Continue Eliquis - If has ERAF will need AAD (?flecainide)   3. OSA - continue CPAP - follow with Dr. Radford Pax to make sure under good control   4. AKI on CKD 3b - likely cardiorenal - repeat labs today   Glori Bickers, MD  12:13 PM        For TEE/DCCV; compliant with apixaban; no changes Kirk Ruths

## 2022-12-18 ENCOUNTER — Encounter (HOSPITAL_COMMUNITY): Payer: Self-pay | Admitting: Cardiology

## 2022-12-19 ENCOUNTER — Ambulatory Visit (INDEPENDENT_AMBULATORY_CARE_PROVIDER_SITE_OTHER): Payer: Medicare HMO | Admitting: Internal Medicine

## 2022-12-19 ENCOUNTER — Encounter: Payer: Self-pay | Admitting: Internal Medicine

## 2022-12-19 VITALS — BP 108/54 | HR 65 | Ht 65.0 in | Wt 144.1 lb

## 2022-12-19 DIAGNOSIS — K222 Esophageal obstruction: Secondary | ICD-10-CM | POA: Diagnosis not present

## 2022-12-19 DIAGNOSIS — K224 Dyskinesia of esophagus: Secondary | ICD-10-CM | POA: Diagnosis not present

## 2022-12-19 DIAGNOSIS — K219 Gastro-esophageal reflux disease without esophagitis: Secondary | ICD-10-CM

## 2022-12-19 DIAGNOSIS — K900 Celiac disease: Secondary | ICD-10-CM | POA: Diagnosis not present

## 2022-12-19 MED ORDER — FAMOTIDINE 40 MG PO TABS: 40.0000 mg | ORAL_TABLET | Freq: Two times a day (BID) | ORAL | 2 refills | Status: DC

## 2022-12-19 NOTE — Progress Notes (Signed)
Subjective:    Patient ID: [name], male    DOB: 1935-03-16, 88 y.o.   MRN: [id number]  HPI [name] is an 88 year old male with a history of GERD, esophageal ring with prior dilation, esophageal dysmotility, prior esophageal candidiasis, celiac disease, history of collagenous colitis, IBS with intermittent abdominal pain, diverticulosis who is here for follow-up.  He was last seen in February 2023.  He was just in the hospital with atrial fibrillation and acute heart failure secondary to A-fib.  He was cardioverted on 12/16/2022 after TEE.  He is here today with his daughter.  I performed an EGD on 01/09/2022.  He had mild esophagitis and balloon dilation of GE junction stricture to 16 mm.  He reports he is feeling better after his cardioversion but prior to this he was having shortness of breath and weight retention.  From a GI perspective he is having some intermittent solid food dysphagia but not as bad as it was before his last dilation.  He is not having heartburn.  He is using the famotidine 40 mg twice a day.  He follows a strict gluten-free diet.  His bowel habits have been regular.  No trouble with his diarrhea or microscopic colitis.  He has multiple visits to follow-up with cardiology this month and next.  Review of Systems As per HPI, otherwise negative  Current Medications, Allergies, Past Medical History, Past Surgical History, Family History and Social History were reviewed in [employer] record.    Objective:   Physical Exam BP (!) 108/54   Pulse 65   Ht '5\' 5"'$  (1.651 m)   Wt 144 lb 2 oz (65.4 kg)   BMI 23.98 kg/m  Gen: awake, alert, NAD HEENT: anicteric  Abd: soft, NT/ND, +BS throughout Ext: no c/c, trace to 1+ edema at the ankle Neuro: nonfocal     Latest Ref Rng & Units 12/10/2022    9:38 AM 05/19/2019    4:49 PM 12/22/2017    2:47 PM  CBC  WBC 3.4 - 10.8 x10E3/uL 4.3  4.9  5.3   Hemoglobin 13.0 - 17.7 g/dL 11.1  11.0  11.4    Hematocrit 37.5 - 51.0 % 35.0  33.2  32.6   Platelets 150 - 450 x10E3/uL 179  207.0  212.0    CMP     Component Value Date/Time   NA 137 12/12/2022 1255   NA 142 12/10/2022 0938   K 4.1 12/12/2022 1255   CL 99 12/12/2022 1255   CO2 26 12/12/2022 1255   GLUCOSE 96 12/12/2022 1255   BUN 25 (H) 12/12/2022 1255   BUN 29 (H) 12/10/2022 0938   CREATININE 1.94 (H) 12/12/2022 1255   CALCIUM 8.9 12/12/2022 1255   PROT 6.3 08/14/2011 1027   ALBUMIN 3.8 08/14/2011 1027   AST 18 08/14/2011 1027   ALT 12 08/14/2011 1027   ALKPHOS 60 08/14/2011 1027   BILITOT 0.5 08/14/2011 1027   GFRNONAA 33 (L) 12/12/2022 1255   GFRAA 55 (L) 05/11/2017 1146       Assessment & Plan:  88 year old male with a history of GERD, esophageal ring with prior dilation, esophageal dysmotility, prior esophageal candidiasis, celiac disease, history of collagenous colitis, IBS with intermittent abdominal pain, diverticulosis who is here for follow-up.   Esophageal stricture/GERD/dysphagia and dysmotility --multifactorial dysphagia but responsive to dilation.  Certainly would not want to repeat dilation now as he was just cardioverted and is on Eliquis. -- Continue famotidine 40 mg twice daily --  We discussed that he needs to chew his food very well, drink liquid before eating in between bites if possible.  Of also encouraged him to slow down when eating. -- Can consider repeat upper endoscopy with dilation in the future if symptoms warrant  2.  Celiac disease --continue gluten-free diet  3.  Collagenous colitis --in remission but previously responsive to budesonide  4.  A-fib with CHF --now on Eliquis, recent cardioversion and will follow-up with Dr. Martinique and Bensimhon  20 minutes total spent today including patient facing time, coordination of care, reviewing medical history/procedures/pertinent radiology studies, and documentation of the encounter.

## 2022-12-19 NOTE — Progress Notes (Unsigned)
Cardiology Office Note   Date:  12/19/2022   ID:  [name], DOB December 23, 1934, MRN [id number]  PCP:  Prince Solian, MD  Cardiologist:   Tahji  Martinique, MD   No chief complaint on file.     History of Present Illness: [name] is a 88 y.o. male who is seen for follow up. I care for his wife [name] and he is Dr Vivi Martens father in law. He has a history of HTN and OSA on CPAP. Prior Myoview in 2010 was normal. He had a cardiac cath in 2018 showing normal coronaries and normal right heart hemodynamics. Echo in 2018 showed mild MR-otherwise normal. He is followed by Dr Radford Pax for OSA. Has restless leg syndrome. Prior arterial dopplers normal.   He was seen 3 weeks ago by Dr Dagmar Hait for general physical and was doing well. About 9 days ago he developed worsening SOB and edema. Noted dry cough. Was seen back at Dr Danna Hefty office and found to be in AFib with rate 110s. Was placed on Eliquis and metoprolol.  Lasix dose was increased to 80 mg daily from 40 mg daily. On follow up Monday creatinine had increased from 1.4 to 1.9 so lasix was reduced to 40 mg daily. CXR done. Was placed on antibiotics for possible PNA. Swelling has improved but he still complains of SOB at rest, orthopnea. Weight did go up to 152 but has improved. He denies any palpitations. Does note slight chest pain at times.   We had initially increased lasix to 40 mg in am and 20 mg in PM. Creatinine repeat was up to 2.1. Pro  BNP was elevated to 2546. BNP to 719. He was seen in office by Dr Haroldine Laws on March 1 and given IV Furoscix. Bedside Echo showed normal EF. On March 5 he underwent TEE by Dr Stanford Breed. This showed normal LV function. Moderate bilatrial enlargement. Severe MR. Mild RV dysfunction. No thrombus. He subsequently had successful DCCV to NSR with single shock.   Past Medical History:  Diagnosis Date   Anemia    Arthritis    osteoarthritis  back   Celiac disease    Chronic kidney disease    Collagenous  colitis    Diverticulosis    Enlarged prostate    followed by dr  Dorina Hoyer   Esophageal dysmotility    Esophageal dysmotility    Esophageal stenosis    Essential hypertension, benign 03/29/2014   External hemorrhoids    External hemorrhoids    Full dentures    GERD (gastroesophageal reflux disease)    Hemorrhoids    Hiatal hernia    IBS (irritable bowel syndrome)    Internal hemorrhoids    OSA on CPAP 10/17/2018   Osteoporosis    Postoperative anemia due to acute blood loss 03/29/2014   Schatzki's ring    Sleep apnea    CPAP Machine   Ulcer     Past Surgical History:  Procedure Laterality Date   APPENDECTOMY  1963   CARDIOVERSION N/A 12/16/2022   Procedure: CARDIOVERSION;  Surgeon: Lelon Perla, MD;  Location: [facility] ENDOSCOPY;  Service: Cardiovascular;  Laterality: N/A;   ESOPHAGOGASTRODUODENOSCOPY  01/09/2022   EYE SURGERY Bilateral    cataract removal - implants   HERNIA REPAIR  2002   right inguinal    HIP SURGERY Left    INGUINAL HERNIA REPAIR  08/19/2011   Procedure: HERNIA REPAIR  left INGUINAL ADULT;  Surgeon: Odis Hollingshead, MD;  Location: [facility] ORS;  Service: General;  Laterality: Left;   INTRAMEDULLARY (IM) NAIL INTERTROCHANTERIC Left 03/27/2014   Procedure: INTRAMEDULLARY (IM) NAIL INTERTROCHANTRIC;  Surgeon: Mauri Pole, MD;  Location: [facility] ORS;  Service: Orthopedics;  Laterality: Left;   RIGHT/LEFT HEART CATH AND CORONARY ANGIOGRAPHY N/A 03/12/2017   Procedure: Right/Left Heart Cath and Coronary Angiography;  Surgeon: Jolaine Artist, MD;  Location: [place] CV LAB;  Service: Cardiovascular;  Laterality: N/A;   TEE WITHOUT CARDIOVERSION N/A 12/16/2022   Procedure: TRANSESOPHAGEAL ECHOCARDIOGRAM (TEE);  Surgeon: Lelon Perla, MD;  Location: [facility] ENDOSCOPY;  Service: Cardiovascular;  Laterality: N/A;   UPPER GASTROINTESTINAL ENDOSCOPY  2008     Current Outpatient Medications  Medication Sig Dispense Refill   acetaminophen (TYLENOL) 650 MG CR tablet  Take 650 mg by mouth every evening.     albuterol (PROVENTIL HFA;VENTOLIN HFA) 108 (90 Base) MCG/ACT inhaler Inhale 2 puffs into the lungs every 6 (six) hours as needed for wheezing or shortness of breath.     alendronate (FOSAMAX) 70 MG tablet Take 70 mg by mouth every Monday.     amLODipine (NORVASC) 10 MG tablet Take 10 mg by mouth every evening.     cefdinir (OMNICEF) 300 MG capsule Take 300 mg by mouth 2 (two) times daily.     diclofenac Sodium (VOLTAREN) 1 % GEL Apply 2 g topically 4 (four) times daily as needed (pain.).     dicyclomine (BENTYL) 10 MG capsule Take 10 mg by mouth 2 (two) times daily as needed (abdominal pain/spasms.).     DOCUSATE SODIUM PO Take 100 mg by mouth daily as needed.     doxazosin (CARDURA) 8 MG tablet Take 8 mg by mouth every evening.     ELIQUIS 5 MG TABS tablet Take 5 mg by mouth 2 (two) times daily.     famotidine (PEPCID) 40 MG tablet Take 1 tablet (40 mg total) by mouth 2 (two) times daily. 180 tablet 2   ferrous sulfate 325 (65 FE) MG tablet Take 325 mg by mouth at bedtime.     furosemide (LASIX) 40 MG tablet TAKE 40 MG IN THE MORNING AND 20 MG IN THE EVENING 135 tablet 3   gabapentin (NEURONTIN) 600 MG tablet Take 600 mg by mouth at bedtime.     HYDROcodone-acetaminophen (NORCO) 5-325 MG per tablet Take 1-2 tablets by mouth every 6 (six) hours as needed. 90 tablet 0   loratadine (CLARITIN) 10 MG tablet Take 10 mg by mouth in the morning.     magnesium oxide (MAG-OX) 400 (240 Mg) MG tablet Take 400 mg by mouth in the morning.     metoprolol tartrate (LOPRESSOR) 25 MG tablet Take 12.5 mg by mouth 2 (two) times daily.     montelukast (SINGULAIR) 10 MG tablet Take 10 mg by mouth in the morning.     Polyethyl Glycol-Propyl Glycol (SYSTANE OP) Place 1 drop into both eyes 3 (three) times daily as needed (dry/irritated eyes.).     potassium chloride SA (KLOR-CON M) 20 MEQ tablet Take 20 mEq by mouth daily with supper.     sertraline (ZOLOFT) 50 MG tablet Take 25  mg by mouth at bedtime.     tiZANidine (ZANAFLEX) 4 MG capsule Take 4 mg by mouth as needed for muscle spasms.     zolpidem (AMBIEN) 10 MG tablet Take 5 mg by mouth at bedtime as needed for sleep.     No current facility-administered medications for this visit.    Allergies:   Wheat  Social History:  The patient  reports that he quit smoking about 41 years ago. His smoking use included cigarettes. He has never used smokeless tobacco. He reports current alcohol use. He reports that he does not use drugs.   Family History:  The patient's family history includes Brain cancer in his brother; Breast cancer in his sister; Cervical cancer in his mother; Colon cancer in his mother; Heart attack in his father; Liver cancer in his brother; Stroke in his father; Throat cancer in his brother.    ROS:  Please see the history of present illness.   Otherwise, review of systems are positive for none.   All other systems are reviewed and negative.    PHYSICAL EXAM: VS:  There were no vitals taken for this visit. , BMI There is no height or weight on file to calculate BMI. GEN: Well nourished, well developed, in no acute distress HEENT: normal Neck: + JVD to jaw, carotid bruits, or masses Cardiac: IRRR; no murmurs, rubs, or gallops,no edema  Respiratory:  minimal rales.  GI: soft, nontender, nondistended, + BS MS: no deformity or atrophy. Pedal pulse are good. Trace edema. Skin: warm and dry, no rash Neuro:  Strength and sensation are intact Psych: euthymic mood, full affect   EKG:  EKG is ordered today.Afib  rate 72. Mild nonspecific ST abnormality. I have personally reviewed and interpreted this study.    Recent Labs: 12/10/2022: Hemoglobin 11.1; NT-Pro BNP 2,546; Platelets 179 12/12/2022: B Natriuretic Peptide 719.4; BUN 25; Creatinine, Ser 1.94; Potassium 4.1; Sodium 137; TSH 1.595   Dated 11/06/20: cholesterol 158, triglycerides 119, HDL 49, LDL 85, creatinine 1.5. otherwise CMET normal. TSH  normal  Dated 11/12/21: cholesterol 169, triglycerides 100, HDL 50, LDL 99. LFTs normal.    Lipid Panel    Component Value Date/Time   CHOL  04/25/2009 0155    134        ATP III CLASSIFICATION:  <200     mg/dL   Desirable  200-239  mg/dL   Borderline High  >=240    mg/dL   High          TRIG 57 04/25/2009 0155   HDL 73 04/25/2009 0155   CHOLHDL 1.8 04/25/2009 0155   VLDL 11 04/25/2009 0155   LDLCALC  04/25/2009 0155    50        Total Cholesterol/HDL:CHD Risk Coronary Heart Disease Risk Table                     Men   Women  1/2 Average Risk   3.4   3.3  Average Risk       5.0   4.4  2 X Average Risk   9.6   7.1  3 X Average Risk  23.4   11.0        Use the calculated Patient Ratio above and the CHD Risk Table to determine the patient's CHD Risk.        ATP III CLASSIFICATION (LDL):  <100     mg/dL   Optimal  100-129  mg/dL   Near or Above                    Optimal  130-159  mg/dL   Borderline  160-189  mg/dL   High  >190     mg/dL   Very High      Wt Readings from Last 3 Encounters:  12/16/22 138 lb (62.6 kg)  12/12/22 144 lb 9.6 oz (65.6 kg)  12/10/22 148 lb (67.1 kg)      Other studies Reviewed: Additional studies/ records that were reviewed today include:   Cardiac cath 03/12/17:  Right/Left Heart Cath and Coronary Angiography   Conclusion    The left ventricular ejection fraction is 55-65% by visual estimate.   Findings:   RA = 2 RV = 24/1 PA = 22/3 (13) PCW = 3 Fick cardiac output/index = 5.9/3.4 PVR = 1.7 WU Ao sat = 97% PA sat = 70%, 74%   Assessment: 1. Normal hemodynamics 2. EF 60% 3. Normal coronary arteries   Plan/Discussion:   Normal cath. Continue medical management.    Glori Bickers, MD  11:07 AM   Echo 03/19/17: Study Conclusions   - Left ventricle: The cavity size was normal. Systolic function was    normal. The estimated ejection fraction was in the range of 55%    to 60%. Wall motion was normal; there were no  regional wall    motion abnormalities.  - Mitral valve: There was mild regurgitation.  - Right atrium: Cannot r/o RA cor triatriatom should not be of any    clinical significance.  - Atrial septum: No defect or patent foramen ovale was identified.  - Pulmonary arteries: PA peak pressure: 34 mm Hg (S).   LE dopplers 06/11/21: Summary:  Right: Resting right ankle-brachial index indicates noncompressible right  lower extremity arteries. The right toe-brachial index is normal. Brisk,  multiphasic waveforms from the groin to ankle consistent with no evidence  of significant peripheral  arterial disease.   Left: Resting left ankle-brachial index is within normal range. No  evidence of significant left lower extremity arterial disease. The left  toe-brachial index is mildly abnormal. Brisk, multiphasic waveforms from  the groin to ankle consistent with no  evidence of significant peripheral arterial disease.     *See table(s) above for measurements and observations.   ASSESSMENT AND PLAN:  1.  Atrial fibrillation new onset associated with acute diastolic CHF. Rate now well controlled. Has been on Eliquis for 5 days. Given significant symptoms will arrange for TEE cardioversion early next week- March 5. Procedure and risks discussed in detail with patient and daughter.  2. Acute diastolic CHF. Volume status improved but Creatinine increased with 80 mg lasix. Recommend he take lasix 40 mg in am and 20 mg in the pm. Restrict salt intake. He needs to stop taking meloxicam given need to take anticoagulation and with CKD. Will repeat BMET and BNP today. Requested copy of recent labs and CXR from PCP.  3.  HTN on amlodipine, Cardura and lasix.now on metoprolol as well for rate control.  3. OSA on CPAP   Current medicines are reviewed at length with the patient today.  The patient does not have concerns regarding medicines.  The following changes have been made:  stop Meloxicam. Increase lasix to 40  mg in am and 20 mg in pm. Low sodium diet. Continue Eliquis and metoprolol.   Labs/ tests ordered today include:   No orders of the defined types were placed in this encounter.     Disposition:  plan TEE cardioversion on Tuesday March 5. I will follow up on March 11.   Signed, Allen Egerton Martinique, MD  12/19/2022 9:26 AM    [facility] [address], [place], [place], [address] Phone [phone], Fax [phone]

## 2022-12-19 NOTE — Patient Instructions (Addendum)
Continue current medications as prescribed.  Follow up as needed.  _______________________________________________________  If your blood pressure at your visit was 140/90 or greater, please contact your primary care physician to follow up on this.  _______________________________________________________  If you are age 86 or older, your body mass index should be between 23-30. Your Body mass index is 23.98 kg/m. If this is out of the aforementioned range listed, please consider follow up with your Primary Care Provider.  If you are age 23 or younger, your body mass index should be between 19-25. Your Body mass index is 23.98 kg/m. If this is out of the aformentioned range listed, please consider follow up with your Primary Care Provider.   ________________________________________________________  The [facility] providers would like to encourage you to use [facility] to communicate with providers for non-urgent requests or questions.  Due to long hold times on the telephone, sending your provider a message by [facility] may be a faster and more efficient way to get a response.  Please allow 48 business hours for a response.  Please remember that this is for non-urgent requests.  _______________________________________________________  Due to recent changes in healthcare laws, you may see the results of your imaging and laboratory studies on MyChart before your provider has had a chance to review them.  We understand that in some cases there may be results that are confusing or concerning to you. Not all laboratory results come back in the same time frame and the provider may be waiting for multiple results in order to interpret others.  Please give [place] 48 hours in order for your provider to thoroughly review all the results before contacting the office for clarification of your results.

## 2022-12-22 ENCOUNTER — Ambulatory Visit: Payer: Medicare HMO | Attending: Cardiology | Admitting: Cardiology

## 2022-12-22 ENCOUNTER — Encounter: Payer: Self-pay | Admitting: Cardiology

## 2022-12-22 VITALS — BP 111/56 | HR 72 | Ht 65.0 in | Wt 141.0 lb

## 2022-12-22 DIAGNOSIS — I5031 Acute diastolic (congestive) heart failure: Secondary | ICD-10-CM | POA: Diagnosis not present

## 2022-12-22 DIAGNOSIS — Z01810 Encounter for preprocedural cardiovascular examination: Secondary | ICD-10-CM

## 2022-12-22 DIAGNOSIS — I480 Paroxysmal atrial fibrillation: Secondary | ICD-10-CM

## 2022-12-22 DIAGNOSIS — I340 Nonrheumatic mitral (valve) insufficiency: Secondary | ICD-10-CM

## 2022-12-22 MED ORDER — FLECAINIDE ACETATE 50 MG PO TABS: 50.0000 mg | ORAL_TABLET | Freq: Two times a day (BID) | ORAL | 3 refills | Status: DC

## 2022-12-22 NOTE — Patient Instructions (Signed)
Medication Instructions:   START Flecainide 50 mg 2 times a day for 4 days then if tolerated INCREASE to 100 mg 2 times a day   *If you need a refill on your cardiac medications before your next appointment, please call your pharmacy*   Lab Work: Your physician recommends that you return for lab work prior to cardioversion:  BMP BNP CBC If you have labs (blood work) drawn today and your tests are completely normal, you will receive your results only by: [place] (if you have MyChart) OR A paper copy in the mail If you have any lab test that is abnormal or we need to change your treatment, we will call you to review the results.   Testing/Procedures: Your physician has recommended that you have a Cardioversion (DCCV). Electrical Cardioversion uses a jolt of electricity to your heart either through paddles or wired patches attached to your chest. This is a controlled, usually prescheduled, procedure. Defibrillation is done under light anesthesia in the hospital, and you usually go home the day of the procedure. This is done to get your heart back into a normal rhythm. You are not awake for the procedure. Please see the instruction sheet given to you today.   Follow-Up: At [facility], you and your health needs are our priority.  As part of our continuing mission to provide you with exceptional heart care, we have created designated Provider Care Teams.  These Care Teams include your primary Cardiologist (physician) and Advanced Practice Providers (APPs -  Physician Assistants and Nurse Practitioners) who all work together to provide you with the care you need, when you need it.   Your next appointment:   Post Cardioversion  on 01/07/23 at 4:40 PM   Provider:   Peter Martinique, MD     Other Instructions

## 2022-12-23 ENCOUNTER — Other Ambulatory Visit: Payer: Self-pay | Admitting: Cardiology

## 2022-12-23 DIAGNOSIS — I4819 Other persistent atrial fibrillation: Secondary | ICD-10-CM

## 2022-12-24 ENCOUNTER — Ambulatory Visit: Payer: Medicare HMO | Attending: Cardiovascular Disease

## 2022-12-24 ENCOUNTER — Telehealth: Payer: Self-pay | Admitting: Cardiology

## 2022-12-24 DIAGNOSIS — Z01810 Encounter for preprocedural cardiovascular examination: Secondary | ICD-10-CM | POA: Diagnosis not present

## 2022-12-24 DIAGNOSIS — I480 Paroxysmal atrial fibrillation: Secondary | ICD-10-CM | POA: Diagnosis not present

## 2022-12-24 DIAGNOSIS — I4819 Other persistent atrial fibrillation: Secondary | ICD-10-CM

## 2022-12-24 NOTE — Telephone Encounter (Signed)
Called patient daughter, she will bring patient for labs this morning and will ask for me, I advised with Dr.Jordan, he would like to complete an EKG to review if in sinus rhythm. I will complete when they arrive for lab visit.

## 2022-12-24 NOTE — Telephone Encounter (Signed)
Patient's daughter is calling to let Dr. Martinique and nurse know that patient's heart monitor at home is showing him back in rhythm. Patient is coming this morning to get blood work and daughter wants to know if they could do an EKG to confirm. Patient is having a cardioversion coming up on Tuesday as well

## 2022-12-24 NOTE — Patient Instructions (Addendum)
   Nurse Visit   Date of Encounter: 12/24/2022 ID: Bernarda Caffey, DOB 1934/11/01, MRN [id number]  PCP:  Prince Solian, [place] [facility] Cardiologist:  Peter Martinique, MD Sleep Medicine:  Fransico Him, MD      Visit Details   VS:  There were no vitals taken for this visit. , BMI There is no height or weight on file to calculate BMI.  Wt Readings from Last 3 Encounters:  12/22/22 141 lb (64 kg)  12/19/22 144 lb 2 oz (65.4 kg)  12/16/22 138 lb (62.6 kg)     Reason for visit: EKG Performed today:  Changes (medications, testing, etc.) : Continue Flecainide 50 mg twice a day Length of Visit: 5 minutes  Dr.Jordan reviewed EKG which revealed sinus rhythm rate 52.Advised to continue Flecainide 50 mg twice a day.Cardioversion scheduled for 12/30/22 was cancelled.Advised to keep appointment with Dr.Jordan 3/27 at 4:40 pm.  Medications Adjustments/Labs and Tests Ordered: Orders Placed This Encounter  Procedures   EKG 12-Lead      Signed, Kathyrn Lass, LPN  10/23/7354 70:14 AM

## 2022-12-24 NOTE — Addendum Note (Signed)
Addended by: Wonda Horner on: 12/24/2022 02:27 PM   Modules accepted: Orders

## 2022-12-25 LAB — BRAIN NATRIURETIC PEPTIDE: BNP: 288.4 pg/mL — ABNORMAL HIGH (ref 0.0–100.0)

## 2022-12-25 LAB — BASIC METABOLIC PANEL
BUN/Creatinine Ratio: 14 (ref 10–24)
BUN: 23 mg/dL (ref 8–27)
CO2: 25 mmol/L (ref 20–29)
Calcium: 8.9 mg/dL (ref 8.6–10.2)
Chloride: 103 mmol/L (ref 96–106)
Creatinine, Ser: 1.63 mg/dL — ABNORMAL HIGH (ref 0.76–1.27)
Glucose: 110 mg/dL — ABNORMAL HIGH (ref 70–99)
Potassium: 4.3 mmol/L (ref 3.5–5.2)
Sodium: 141 mmol/L (ref 134–144)
eGFR: 41 mL/min/{1.73_m2} — ABNORMAL LOW (ref >59)

## 2022-12-25 LAB — CBC
Hematocrit: 33.9 % — ABNORMAL LOW (ref 37.5–51.0)
Hemoglobin: 11 g/dL — ABNORMAL LOW (ref 13.0–17.7)
MCH: 29.9 pg (ref 26.6–33.0)
MCHC: 32.4 g/dL (ref 31.5–35.7)
MCV: 92 fL (ref 79–97)
Platelets: 190 10*3/uL (ref 150–450)
RBC: 3.68 x10E6/uL — ABNORMAL LOW (ref 4.14–5.80)
RDW: 13.1 % (ref 11.6–15.4)
WBC: 4.2 10*3/uL (ref 3.4–10.8)

## 2022-12-25 NOTE — Telephone Encounter (Signed)
EKG nurse visit completed 12/24/22

## 2022-12-30 ENCOUNTER — Ambulatory Visit (HOSPITAL_COMMUNITY): Admit: 2022-12-30 | Payer: Medicare HMO | Admitting: Internal Medicine

## 2022-12-30 ENCOUNTER — Telehealth: Payer: Self-pay | Admitting: Cardiology

## 2022-12-30 ENCOUNTER — Encounter (HOSPITAL_COMMUNITY): Payer: Self-pay

## 2022-12-30 SURGERY — CARDIOVERSION
Anesthesia: General

## 2022-12-30 NOTE — Telephone Encounter (Signed)
Spoke to patient's daughter Doreene Nest.She stated father's heart went out of rhythm last night.Stated he don't feel good.He is sob.She wanted to know if he needed to increase Flecainide to 100 mg.B/P at present 108/58 pulse 63.Advised Dr.Jordan is out of office this afternoon.I spoke to DOD Dr.Berry he advised to continue Flecainide 50 mg twice a day and schedule appointment with Dr.Jordan tomorrow.Appointment scheduled with Dr.Jordan 3/20 at 11:00 am.Advised he is being worked in he may have to wait.Advised if he becomes worse he will need to go to ED.

## 2022-12-30 NOTE — Telephone Encounter (Signed)
Patient c/o Palpitations:  High priority if patient c/o lightheadedness, shortness of breath, or chest pain  How long have you had palpitations/irregular HR/ Afib? Are you having the symptoms now?   No  Are you currently experiencing lightheadedness, SOB or CP?   A little SOB, lightheadedness  Do you have a history of afib (atrial fibrillation) or irregular heart rhythm?   New onset, within the past month  Have you checked your BP or HR? (document readings if available):  BP 108/50  HR 63  Are you experiencing any other symptoms?  No  Daughter is concerned patient may need medication change.

## 2022-12-30 NOTE — Telephone Encounter (Signed)
Patient daughter called and states patient in A-Fib again.  He is currently taking the Flecainide 50 mg BID and it put him in normal rhythm the first day.  She states he started last night and monitor states he is in Afib again.  He does "not feel good" and at times he "can't catch his breath" Called to patient and he states he is still not feeling well.  States he is a little SOB with walking and at times when sitting he will have to take a few deep breaths to "catch my breath".   Readings Today BP 108/58  HR 63 and states in Afib Yesterday 119/59  HR 89 and normal rhythm.  His daughter ask should they go ahead and increase his Flecainide to 100 mg Twice a day as discussed at appointment. He was told not to increase when the 50 mg was enough to put him in normal rhythm, but now ask should they do the increase.

## 2022-12-31 ENCOUNTER — Ambulatory Visit: Payer: Medicare HMO | Admitting: Cardiology

## 2022-12-31 ENCOUNTER — Telehealth: Payer: Self-pay | Admitting: Cardiology

## 2022-12-31 NOTE — Telephone Encounter (Signed)
I called daughter to discuss. Patient went back into Afib. Had previously converted on Flecainide 50 mg bid. Recommend he go ahead with higher dose of 100 mg bid. She will check back with me on Friday. If he remains in Afib will arrange DCCV next week on higher Flecainide dose.   Siniyah Evangelist Martinique MD, [facility]

## 2023-01-07 ENCOUNTER — Ambulatory Visit: Payer: Medicare HMO | Admitting: Cardiology

## 2023-01-08 NOTE — Progress Notes (Signed)
Cardiology Office Note   Date:  01/16/2023   ID:  [name], DOB 1935-04-13, MRN [id number]  PCP:  Chilton Greathouse, MD  Cardiologist:   Josephus Harriger [place], MD   No chief complaint on file.     History of Present Illness: [name] is a 89 y.o. male who is seen for follow up. I care for his wife [name] and he is Dr [name] father in law. He has a history of HTN and OSA on CPAP. Prior Myoview in 2010 was normal. He had a cardiac cath in 2018 showing normal coronaries and normal right heart hemodynamics. Echo in 2018 showed mild MR-otherwise normal. He is followed by Dr Mayford Knife for OSA. Has restless leg syndrome. Prior arterial dopplers normal.   He was seen 3 weeks ago by Dr Felipa Eth for general physical and was doing well. About 9 days ago he developed worsening SOB and edema. Noted dry cough. Was seen back at Dr Vicente Males office and found to be in AFib with rate 110s. Was placed on Eliquis and metoprolol.  Lasix dose was increased to 80 mg daily from 40 mg daily. On follow up Monday creatinine had increased from 1.4 to 1.9 so lasix was reduced to 40 mg daily. CXR done. Was placed on antibiotics for possible PNA. Swelling has improved but he still complains of SOB at rest, orthopnea. Weight did go up to 152 but has improved. He denies any palpitations. Does note slight chest pain at times.   We had initially increased lasix to 40 mg in am and 20 mg in PM. Creatinine repeat was up to 2.1. Pro  BNP was elevated to 2546. BNP to 719. He was seen in office by Dr Gala Romney on March 1 and given IV Furoscix. He did have a good diuresis with this but he felt really wiped out with this. Bedside Echo showed normal EF. On March 5 he underwent TEE by Dr Jens Som. This showed normal LV function. Moderate bilatrial enlargement. Severe MR. Mild RV dysfunction. No thrombus. He subsequently had successful DCCV to NSR with single shock.   He had early return of AFib and was symptomatic. He was started on  Flecainide 50 mg bid. Initially converted on therapy but then had recurrence again. Flecainide was increased to 100 mg bid and he converted again. He then developed persistent bradycardia with pulse in the 40s. Metoprolol was discontinued with improvement.   On follow up today he is feeling very well. No SOB or chest pain. No recurrent Afib since on 100 mg bid Flecainide. He does feel a little woozy when he stands up. Weight is down 8-10 lbs. HR now in 60-70 range.  Past Medical History:  Diagnosis Date   Anemia    Arthritis    osteoarthritis  back   Celiac disease    Chronic kidney disease    Collagenous colitis    Diverticulosis    Enlarged prostate    followed by dr  [name]   Esophageal dysmotility    Esophageal dysmotility    Esophageal stenosis    Essential hypertension, benign 03/29/2014   External hemorrhoids    External hemorrhoids    Full dentures    GERD (gastroesophageal reflux disease)    Hemorrhoids    Hiatal hernia    IBS (irritable bowel syndrome)    Internal hemorrhoids    OSA on CPAP 10/17/2018   Osteoporosis    Postoperative anemia due to acute blood loss 03/29/2014   Schatzki's ring  Sleep apnea    CPAP Machine   Ulcer     Past Surgical History:  Procedure Laterality Date   APPENDECTOMY  1963   CARDIOVERSION N/A 12/16/2022   Procedure: CARDIOVERSION;  Surgeon: Lewayne Bunting, MD;  Location: [facility] ENDOSCOPY;  Service: Cardiovascular;  Laterality: N/A;   ESOPHAGOGASTRODUODENOSCOPY  01/09/2022   EYE SURGERY Bilateral    cataract removal - implants   HERNIA REPAIR  2002   right inguinal    HIP SURGERY Left    INGUINAL HERNIA REPAIR  08/19/2011   Procedure: HERNIA REPAIR  left INGUINAL ADULT;  Surgeon: Adolph Pollack, MD;  Location: [facility] ORS;  Service: General;  Laterality: Left;   INTRAMEDULLARY (IM) NAIL INTERTROCHANTERIC Left 03/27/2014   Procedure: INTRAMEDULLARY (IM) NAIL INTERTROCHANTRIC;  Surgeon: Shelda Pal, MD;  Location: [facility] ORS;  Service:  Orthopedics;  Laterality: Left;   RIGHT/LEFT HEART CATH AND CORONARY ANGIOGRAPHY N/A 03/12/2017   Procedure: Right/Left Heart Cath and Coronary Angiography;  Surgeon: Dolores Patty, MD;  Location: [facility] INVASIVE CV LAB;  Service: Cardiovascular;  Laterality: N/A;   TEE WITHOUT CARDIOVERSION N/A 12/16/2022   Procedure: TRANSESOPHAGEAL ECHOCARDIOGRAM (TEE);  Surgeon: Lewayne Bunting, MD;  Location: [facility] ENDOSCOPY;  Service: Cardiovascular;  Laterality: N/A;   UPPER GASTROINTESTINAL ENDOSCOPY  2008     Current Outpatient Medications  Medication Sig Dispense Refill   acetaminophen (TYLENOL) 650 MG CR tablet Take 650 mg by mouth every evening.     albuterol (PROVENTIL HFA;VENTOLIN HFA) 108 (90 Base) MCG/ACT inhaler Inhale 2 puffs into the lungs every 6 (six) hours as needed for wheezing or shortness of breath.     alendronate (FOSAMAX) 70 MG tablet Take 70 mg by mouth every Monday.     diclofenac Sodium (VOLTAREN) 1 % GEL Apply 2 g topically 4 (four) times daily as needed (pain.).     dicyclomine (BENTYL) 10 MG capsule Take 10 mg by mouth 2 (two) times daily as needed (abdominal pain/spasms.).     DOCUSATE SODIUM PO Take 100 mg by mouth daily as needed.     doxazosin (CARDURA) 8 MG tablet Take 8 mg by mouth every evening.     ELIQUIS 5 MG TABS tablet Take 5 mg by mouth 2 (two) times daily.     famotidine (PEPCID) 40 MG tablet Take 1 tablet (40 mg total) by mouth 2 (two) times daily. 180 tablet 2   ferrous sulfate 325 (65 FE) MG tablet Take 325 mg by mouth at bedtime.     flecainide (TAMBOCOR) 100 MG tablet Take 100 mg by mouth 2 (two) times daily.     furosemide (LASIX) 40 MG tablet TAKE 40 MG IN THE MORNING AND 20 MG IN THE EVENING 135 tablet 3   gabapentin (NEURONTIN) 600 MG tablet Take 600 mg by mouth at bedtime.     HYDROcodone-acetaminophen (NORCO) 5-325 MG per tablet Take 1-2 tablets by mouth every 6 (six) hours as needed. 90 tablet 0   loratadine (CLARITIN) 10 MG tablet Take 10 mg by  mouth in the morning.     magnesium oxide (MAG-OX) 400 (240 Mg) MG tablet Take 400 mg by mouth in the morning.     montelukast (SINGULAIR) 10 MG tablet Take 10 mg by mouth in the morning.     Polyethyl Glycol-Propyl Glycol (SYSTANE OP) Place 1 drop into both eyes 3 (three) times daily as needed (dry/irritated eyes.).     potassium chloride SA (KLOR-CON M) 20 MEQ tablet Take 20 mEq by  mouth daily with supper.     sertraline (ZOLOFT) 50 MG tablet Take 25 mg by mouth at bedtime.     tiZANidine (ZANAFLEX) 4 MG capsule Take 4 mg by mouth as needed for muscle spasms.     zolpidem (AMBIEN) 10 MG tablet Take 5 mg by mouth at bedtime as needed for sleep.     amLODipine (NORVASC) 10 MG tablet Take 10 mg by mouth every evening. (Patient not taking: Reported on 01/16/2023)     No current facility-administered medications for this visit.    Allergies:   Wheat    Social History:  The patient  reports that he quit smoking about 41 years ago. His smoking use included cigarettes. He has never used smokeless tobacco. He reports current alcohol use. He reports that he does not use drugs.   Family History:  The patient's family history includes Brain cancer in his brother; Breast cancer in his sister; Cervical cancer in his mother; Colon cancer in his mother; Heart attack in his father; Liver cancer in his brother; Stroke in his father; Throat cancer in his brother.    ROS:  Please see the history of present illness.   Otherwise, review of systems are positive for none.   All other systems are reviewed and negative.    PHYSICAL EXAM: VS:  BP (!) 104/52   Pulse 65   Ht 5\' 5"  (1.651 m)   Wt 136 lb 6.4 oz (61.9 kg)   BMI 22.70 kg/m  , BMI Body mass index is 22.7 kg/m. GEN: Well nourished, well developed, in no acute distress HEENT: normal Neck: JVD flat  carotid bruits, or masses Cardiac: RRR gr 1/6 systolic murmur at the apex. No rubs, or gallops,no edema  Respiratory:  clear GI: soft, nontender,  nondistended, + BS MS: no deformity or atrophy. Pedal pulse are good. Trace edema. Skin: warm and dry, no rash Neuro:  Strength and sensation are intact Psych: euthymic mood, full affect   EKG:  EKG is ordered today.NSR rate 65. First degree AV block. Incomplete LBBB. I have personally reviewed and interpreted this study.   Recent Labs: 12/10/2022: NT-Pro BNP 2,546 12/12/2022: TSH 1.595 12/24/2022: BNP 288.4; BUN 23; Creatinine, Ser 1.63; Hemoglobin 11.0; Platelets 190; Potassium 4.3; Sodium 141   Dated 11/06/20: cholesterol 158, triglycerides 119, HDL 49, LDL 85, creatinine 1.5. otherwise CMET normal. TSH normal  Dated 11/12/21: cholesterol 169, triglycerides 100, HDL 50, LDL 99. LFTs normal.    Lipid Panel    Component Value Date/Time   CHOL  04/25/2009 0155    134        ATP III CLASSIFICATION:  <200     mg/dL   Desirable  [id number]  mg/dL   Borderline High  >=562>=240    mg/dL   High          TRIG 57 04/25/2009 0155   HDL 73 04/25/2009 0155   CHOLHDL 1.8 04/25/2009 0155   VLDL 11 04/25/2009 0155   LDLCALC  04/25/2009 0155    50        Total Cholesterol/HDL:CHD Risk Coronary Heart Disease Risk Table                     Men   Women  1/2 Average Risk   3.4   3.3  Average Risk       5.0   4.4  2 X Average Risk   9.6   7.1  3 X Average Risk  23.4  11.0        Use the calculated Patient Ratio above and the CHD Risk Table to determine the patient's CHD Risk.        ATP III CLASSIFICATION (LDL):  <100     mg/dL   Optimal  761-950  mg/dL   Near or Above                    Optimal  130-159  mg/dL   Borderline  932-671  mg/dL   High  >245     mg/dL   Very High      Wt Readings from Last 3 Encounters:  01/16/23 136 lb 6.4 oz (61.9 kg)  12/22/22 141 lb (64 kg)  12/19/22 144 lb 2 oz (65.4 kg)      Other studies Reviewed: Additional studies/ records that were reviewed today include:   Cardiac cath 03/12/17:  Right/Left Heart Cath and Coronary Angiography    Conclusion    The left ventricular ejection fraction is 55-65% by visual estimate.   Findings:   RA = 2 RV = 24/1 PA = 22/3 (13) PCW = 3 Fick cardiac output/index = 5.9/3.4 PVR = 1.7 WU Ao sat = 97% PA sat = 70%, 74%   Assessment: 1. Normal hemodynamics 2. EF 60% 3. Normal coronary arteries   Plan/Discussion:   Normal cath. Continue medical management.    Arvilla Meres, MD  11:07 AM   Echo 03/19/17: Study Conclusions   - Left ventricle: The cavity size was normal. Systolic function was    normal. The estimated ejection fraction was in the range of 55%    to 60%. Wall motion was normal; there were no regional wall    motion abnormalities.  - Mitral valve: There was mild regurgitation.  - Right atrium: Cannot r/o RA cor triatriatom should not be of any    clinical significance.  - Atrial septum: No defect or patent foramen ovale was identified.  - Pulmonary arteries: PA peak pressure: 34 mm Hg (S).   LE dopplers 06/11/21: Summary:  Right: Resting right ankle-brachial index indicates noncompressible right  lower extremity arteries. The right toe-brachial index is normal. Brisk,  multiphasic waveforms from the groin to ankle consistent with no evidence  of significant peripheral  arterial disease.   Left: Resting left ankle-brachial index is within normal range. No  evidence of significant left lower extremity arterial disease. The left  toe-brachial index is mildly abnormal. Brisk, multiphasic waveforms from  the groin to ankle consistent with no  evidence of significant peripheral arterial disease.     *See table(s) above for measurements and observations.   ASSESSMENT AND PLAN:  1.  Atrial fibrillation new onset associated with acute diastolic CHF. On Eliquis. TEE showed  biatrial enlargement and severe MR. Symptoms clearly improved with restoration of NSR. He had Early return of Afib post DCCV. Converted on Flecainide 50 mg bid but then had recurrence  again. Converted on 100 mg bid and has maintained NSR since.  Metoprolol discontinued due to bradycardia.  2. Chronic  diastolic CHF. Volume status improved. Weigh is down and no edema. Mild orthostatic dizziness. Will reduce lasix from 60 to 40 mg daily.  3.  HTN controlled.  3. OSA on CPAP 4. MR. Was severe on TEE but exam today is really unimpressive. Suspect worsening MR may have been in setting of AFib and acute CHF. Will repeat TTE now to reassess in NSR. If Moderate to severe would consider right and  left heart cath to look at hemodynamics and consider a Mitraclip. If MR is not that bad then will continue medical management.   Current medicines are reviewed at length with the patient today.  The patient does not have concerns regarding medicines.  The following changes have been made:  reduce lasix to 40 mg daily  Labs/ tests ordered today include:   Echo  Follow up 2 months   Signed, Carnel Stegman [place], MD  01/16/2023 10:43 AM    [facility] [address] [place], [place], [address] Phone [phone], Fax [phone]

## 2023-01-12 DIAGNOSIS — Z85828 Personal history of other malignant neoplasm of skin: Secondary | ICD-10-CM | POA: Diagnosis not present

## 2023-01-12 DIAGNOSIS — L570 Actinic keratosis: Secondary | ICD-10-CM | POA: Diagnosis not present

## 2023-01-12 DIAGNOSIS — D1801 Hemangioma of skin and subcutaneous tissue: Secondary | ICD-10-CM | POA: Diagnosis not present

## 2023-01-12 DIAGNOSIS — L821 Other seborrheic keratosis: Secondary | ICD-10-CM | POA: Diagnosis not present

## 2023-01-16 ENCOUNTER — Encounter: Payer: Self-pay | Admitting: Cardiology

## 2023-01-16 ENCOUNTER — Ambulatory Visit: Payer: Medicare HMO | Attending: Cardiology | Admitting: Cardiology

## 2023-01-16 VITALS — BP 104/52 | HR 65 | Ht 65.0 in | Wt 136.4 lb

## 2023-01-16 DIAGNOSIS — I4819 Other persistent atrial fibrillation: Secondary | ICD-10-CM

## 2023-01-16 DIAGNOSIS — I10 Essential (primary) hypertension: Secondary | ICD-10-CM | POA: Diagnosis not present

## 2023-01-16 DIAGNOSIS — I340 Nonrheumatic mitral (valve) insufficiency: Secondary | ICD-10-CM

## 2023-01-16 MED ORDER — FUROSEMIDE 40 MG PO TABS: 40.0000 mg | ORAL_TABLET | Freq: Every day | ORAL | 3 refills | Status: DC

## 2023-01-16 NOTE — Patient Instructions (Signed)
Medication Instructions:  Decrease Lasix to 40 mg daily Continue all other medications *If you need a refill on your cardiac medications before your next appointment, please call your pharmacy*   Lab Work: None ordered   Testing/Procedures: Echo   Follow-Up: At [facility], you and your health needs are our priority.  As part of our continuing mission to provide you with exceptional heart care, we have created designated Provider Care Teams.  These Care Teams include your primary Cardiologist (physician) and Advanced Practice Providers (APPs -  Physician Assistants and Nurse Practitioners) who all work together to provide you with the care you need, when you need it.  We recommend signing up for the patient portal called "MyChart".  Sign up information is provided on this After Visit Summary.  MyChart is used to connect with patients for Virtual Visits (Telemedicine).  Patients are able to view lab/test results, encounter notes, upcoming appointments, etc.  Non-urgent messages can be sent to your provider as well.   To learn more about what you can do with MyChart, go to [email or web address].    Your next appointment:  2 months    Provider:  Dr.Jordan

## 2023-01-21 ENCOUNTER — Telehealth: Payer: Self-pay | Admitting: Cardiology

## 2023-01-21 ENCOUNTER — Other Ambulatory Visit: Payer: Self-pay | Admitting: Pharmacist

## 2023-01-21 ENCOUNTER — Other Ambulatory Visit: Payer: Self-pay

## 2023-01-21 DIAGNOSIS — I480 Paroxysmal atrial fibrillation: Secondary | ICD-10-CM

## 2023-01-21 MED ORDER — APIXABAN 2.5 MG PO TABS: 2.5000 mg | ORAL_TABLET | Freq: Two times a day (BID) | ORAL | 1 refills | Status: DC

## 2023-01-21 NOTE — Telephone Encounter (Signed)
Prescription refill request for Eliquis received. Indication: a fib Last office visit: 01/16/23 Scr: 1.63 epic 12/24/22 Age: 88 Weight: 61 kg  Need dose reduction.  Called and spoke with patient and he is aware.

## 2023-01-21 NOTE — Telephone Encounter (Signed)
Pts daughter, Synetta Fail, states that per the pt, they were called and advised to decrease eliquis from 5 to 2.5. Pt and pts daughter would like to verify this by Elnita Maxwell. Please advise.

## 2023-01-21 NOTE — Telephone Encounter (Signed)
Pt (daughter) Georgiana Shore informed of providers recommendations. Pt (daughter) Georgiana Shore verbalized understanding. She will inform pt.

## 2023-01-23 ENCOUNTER — Other Ambulatory Visit: Payer: Self-pay

## 2023-01-23 MED ORDER — POTASSIUM CHLORIDE CRYS ER 20 MEQ PO TBCR: 20.0000 meq | EXTENDED_RELEASE_TABLET | Freq: Every day | ORAL | 3 refills | Status: DC

## 2023-01-23 MED ORDER — FLECAINIDE ACETATE 100 MG PO TABS: 100.0000 mg | ORAL_TABLET | Freq: Two times a day (BID) | ORAL | 3 refills | Status: DC

## 2023-01-28 ENCOUNTER — Ambulatory Visit (HOSPITAL_COMMUNITY)
Admission: RE | Admit: 2023-01-28 | Discharge: 2023-01-28 | Disposition: A | Discharge status: Home / Self Care | Payer: Medicare HMO | Source: Ambulatory Visit | Attending: Internal Medicine | Admitting: Internal Medicine

## 2023-01-28 ENCOUNTER — Encounter (HOSPITAL_COMMUNITY): Payer: Self-pay | Admitting: Internal Medicine

## 2023-01-28 VITALS — BP 110/68 | HR 70 | Wt 139.0 lb

## 2023-01-28 DIAGNOSIS — G4733 Obstructive sleep apnea (adult) (pediatric): Secondary | ICD-10-CM | POA: Diagnosis not present

## 2023-01-28 DIAGNOSIS — I480 Paroxysmal atrial fibrillation: Secondary | ICD-10-CM

## 2023-01-28 DIAGNOSIS — I5032 Chronic diastolic (congestive) heart failure: Secondary | ICD-10-CM | POA: Diagnosis not present

## 2023-01-28 DIAGNOSIS — N1832 Chronic kidney disease, stage 3b: Secondary | ICD-10-CM | POA: Insufficient documentation

## 2023-01-28 DIAGNOSIS — Z79899 Other long term (current) drug therapy: Secondary | ICD-10-CM | POA: Diagnosis not present

## 2023-01-28 DIAGNOSIS — Z7901 Long term (current) use of anticoagulants: Secondary | ICD-10-CM | POA: Insufficient documentation

## 2023-01-28 DIAGNOSIS — I130 Hypertensive heart and chronic kidney disease with heart failure and stage 1 through stage 4 chronic kidney disease, or unspecified chronic kidney disease: Secondary | ICD-10-CM | POA: Insufficient documentation

## 2023-01-28 DIAGNOSIS — G2581 Restless legs syndrome: Secondary | ICD-10-CM | POA: Insufficient documentation

## 2023-01-28 DIAGNOSIS — I4892 Unspecified atrial flutter: Secondary | ICD-10-CM | POA: Insufficient documentation

## 2023-01-28 DIAGNOSIS — I340 Nonrheumatic mitral (valve) insufficiency: Secondary | ICD-10-CM | POA: Diagnosis not present

## 2023-01-28 LAB — BASIC METABOLIC PANEL
Anion gap: 7 (ref 5–15)
BUN: 24 mg/dL — ABNORMAL HIGH (ref 8–23)
CO2: 30 mmol/L (ref 22–32)
Calcium: 8.7 mg/dL — ABNORMAL LOW (ref 8.9–10.3)
Chloride: 100 mmol/L (ref 98–111)
Creatinine, Ser: 1.94 mg/dL — ABNORMAL HIGH (ref 0.61–1.24)
GFR, Estimated: 33 mL/min — ABNORMAL LOW (ref >60)
Glucose, Bld: 77 mg/dL (ref 70–99)
Potassium: 4.2 mmol/L (ref 3.5–5.1)
Sodium: 137 mmol/L (ref 135–145)

## 2023-01-28 LAB — CBC
HCT: 36.5 % — ABNORMAL LOW (ref 39.0–52.0)
Hemoglobin: 12 g/dL — ABNORMAL LOW (ref 13.0–17.0)
MCH: 29.7 pg (ref 26.0–34.0)
MCHC: 32.9 g/dL (ref 30.0–36.0)
MCV: 90.3 fL (ref 80.0–100.0)
Platelets: 175 10*3/uL (ref 150–400)
RBC: 4.04 MIL/uL — ABNORMAL LOW (ref 4.22–5.81)
RDW: 13.9 % (ref 11.5–15.5)
WBC: 4.1 10*3/uL (ref 4.0–10.5)
nRBC: 0 % (ref 0.0–0.2)

## 2023-01-28 LAB — BRAIN NATRIURETIC PEPTIDE: B Natriuretic Peptide: 134.4 pg/mL — ABNORMAL HIGH (ref 0.0–100.0)

## 2023-01-28 NOTE — Patient Instructions (Signed)
DECREASE Doxazosin to 4 mg daily  Labs done today, your results will be available in MyChart, we will contact you for abnormal readings.  Your physician recommends that you schedule a follow-up appointment in:  2 months(June) Call office in May to schedule an appointment  If you have any questions or concerns before your next appointment please send [place] a message through [place] or call our office at [phone].    TO LEAVE A MESSAGE FOR THE NURSE SELECT OPTION 2, PLEASE LEAVE A MESSAGE INCLUDING: YOUR NAME DATE OF BIRTH CALL BACK NUMBER REASON FOR CALL**this is important as we prioritize the call backs  YOU WILL RECEIVE A CALL BACK THE SAME DAY AS LONG AS YOU CALL BEFORE 4:00 PM  At the [facility], you and your health needs are our priority. As part of our continuing mission to provide you with exceptional heart care, we have created designated Provider Care Teams. These Care Teams include your primary Cardiologist (physician) and Advanced Practice Providers (APPs- Physician Assistants and Nurse Practitioners) who all work together to provide you with the care you need, when you need it.   You may see any of the following providers on your designated Care Team at your next follow up: Dr Arvilla Meres Dr Marca Ancona Dr. Marcos Eke, NP Robbie Lis, [place] [facility] [place], [place] Brynda Peon, NP Karle Plumber, PharmD   Please be sure to bring in all your medications bottles to every appointment.    Thank you for choosing [facility]

## 2023-01-28 NOTE — Progress Notes (Signed)
[facility] CONSULT NOTE  Referring Physician: Chilton Greathouse, MD Primary Care: Chilton Greathouse, MD Primary Cardiologist: Peter [place], MD  HPI:  GARHETT BERNHARD is a 88 y.o. male who is seen for follow up. I care for his wife [name] and he is Dr [name] father in law. He has a history of HTN and OSA on CPAP. Prior Myoview in 2010 was normal. He had a cardiac cath in 2018 showing normal coronaries and normal right heart hemodynamics. Echo in 2018 showed mild MR-otherwise normal. He is followed by Dr Mayford Knife for OSA. Has restless leg syndrome. Prior arterial dopplers normal.    Developed new onset AF in 2/24   Was seen by Dr. [place] earlier this week. Lasix increased to 40/20. Scr now 2.1   Had DC-CV 12/16/22 lasted only a few days. Loaded Flecainide and converted. Had recurrent AF on Flecainide 50 bid. Now maintaining NSR on 100 bid   Feels good. Back to his usual activities. Some dizziness on occasion when he stands. SBP 110-130. No edema, orthopnea or PND  TEE 3/24 EF 55-60% mod-sev MR   Has repeat echo 02/17/23   Past Medical History:  Diagnosis Date   Anemia    Arthritis    osteoarthritis  back   Celiac disease    Chronic kidney disease    Collagenous colitis    Diverticulosis    Enlarged prostate    followed by dr  [name]   Esophageal dysmotility    Esophageal dysmotility    Esophageal stenosis    Essential hypertension, benign 03/29/2014   External hemorrhoids    External hemorrhoids    Full dentures    GERD (gastroesophageal reflux disease)    Hemorrhoids    Hiatal hernia    IBS (irritable bowel syndrome)    Internal hemorrhoids    OSA on CPAP 10/17/2018   Osteoporosis    Postoperative anemia due to acute blood loss 03/29/2014   Schatzki's ring    Sleep apnea    CPAP Machine   Ulcer     Current Outpatient Medications  Medication Sig Dispense Refill   acetaminophen (TYLENOL) 650 MG CR tablet Take 650 mg by mouth every evening.     albuterol  (PROVENTIL HFA;VENTOLIN HFA) 108 (90 Base) MCG/ACT inhaler Inhale 2 puffs into the lungs every 6 (six) hours as needed for wheezing or shortness of breath.     alendronate (FOSAMAX) 70 MG tablet Take 70 mg by mouth every Monday.     apixaban (ELIQUIS) 2.5 MG TABS tablet Take 1 tablet (2.5 mg total) by mouth 2 (two) times daily. 180 tablet 1   diclofenac Sodium (VOLTAREN) 1 % GEL Apply 2 g topically 4 (four) times daily as needed (pain.).     dicyclomine (BENTYL) 10 MG capsule Take 10 mg by mouth 2 (two) times daily as needed (abdominal pain/spasms.).     DOCUSATE SODIUM PO Take 100 mg by mouth daily as needed.     doxazosin (CARDURA) 8 MG tablet Take 8 mg by mouth every evening.     famotidine (PEPCID) 40 MG tablet Take 1 tablet (40 mg total) by mouth 2 (two) times daily. 180 tablet 2   ferrous sulfate 325 (65 FE) MG tablet Take 325 mg by mouth at bedtime.     flecainide (TAMBOCOR) 100 MG tablet Take 1 tablet (100 mg total) by mouth 2 (two) times daily. 180 tablet 3   furosemide (LASIX) 40 MG tablet Take 1 tablet (40 mg total) by  mouth daily. 90 tablet 3   gabapentin (NEURONTIN) 600 MG tablet Take 600 mg by mouth at bedtime.     HYDROcodone-acetaminophen (NORCO) 5-325 MG per tablet Take 1-2 tablets by mouth every 6 (six) hours as needed. 90 tablet 0   loratadine (CLARITIN) 10 MG tablet Take 10 mg by mouth in the morning.     magnesium oxide (MAG-OX) 400 (240 Mg) MG tablet Take 400 mg by mouth in the morning.     montelukast (SINGULAIR) 10 MG tablet Take 10 mg by mouth in the morning.     Polyethyl Glycol-Propyl Glycol (SYSTANE OP) Place 1 drop into both eyes 3 (three) times daily as needed (dry/irritated eyes.).     potassium chloride SA (KLOR-CON M) 20 MEQ tablet Take 1 tablet (20 mEq total) by mouth daily with supper. 90 tablet 3   sertraline (ZOLOFT) 50 MG tablet Take 25 mg by mouth at bedtime.     tiZANidine (ZANAFLEX) 4 MG capsule Take 4 mg by mouth as needed for muscle spasms.     zolpidem  (AMBIEN) 10 MG tablet Take 5 mg by mouth at bedtime as needed for sleep.     No current facility-administered medications for this encounter.    Allergies  Allergen Reactions   Wheat Other (See Comments)    Celiac Disease      Social History   Socioeconomic History   Marital status: Married    Spouse name: Not on file   Number of children: 3   Years of education: Not on file   Highest education level: Not on file  Occupational History   Occupation: retired  Tobacco Use   Smoking status: Former    Types: Cigarettes    Quit date: 08/11/1981    Years since quitting: 41.4   Smokeless tobacco: Never  Vaping Use   Vaping Use: Never used  Substance and Sexual Activity   Alcohol use: Yes    Comment: ocassionally   Drug use: No   Sexual activity: Not on file  Other Topics Concern   Not on file  Social History Narrative   Not on file   Social Determinants of Health   Financial Resource Strain: Not on file  Food Insecurity: Not on file  Transportation Needs: Not on file  Physical Activity: Not on file  Stress: Not on file  Social Connections: Not on file  Intimate Partner Violence: Not on file      Family History  Problem Relation Age of Onset   Cervical cancer Mother    Colon cancer Mother    Stroke Father    Heart attack Father    Breast cancer Sister    Throat cancer Brother    Liver cancer Brother    Brain cancer Brother    Esophageal cancer Neg Hx    Rectal cancer Neg Hx    Stomach cancer Neg Hx     Vitals:   01/28/23 0959  BP: 110/68  Pulse: 70  SpO2: 96%  Weight: 63 kg (139 lb)     PHYSICAL EXAM: General:  Elderly Well appearing. No respiratory difficulty HEENT: normal Neck: supple. no JVD. Carotids 2+ bilat; no bruits. No lymphadenopathy or thryomegaly appreciated. Cor: PMI nondisplaced. Regular rate & rhythm. 2/6 MR  Lungs: clear Abdomen: soft, nontender, nondistended. No hepatosplenomegaly. No bruits or masses. Good bowel  sounds. Extremities: no cyanosis, clubbing, rash, edema Neuro: alert & orientedx3, cranial nerves grossly intact. moves all 4 extremities w/o difficulty. Affect pleasant   ECG:  NSR 71 1AVB 244 ms. QRS 106 No ST-T wave abnormalities.     ASSESSMENT & PLAN:  1. Chronic diastolic HF in setting of AF - TEE 3/24 EF 55-60% mod-sev MR - much improved with return of NSR  - May be a bit dry - Consider switching lasix to every other day or to low-dose [place] pending results of labs   2. PAF - new-onset 2/24 - now mai ntaining NSR on flecainide 100 bid. ECG ok today - Continue Eliquis  3. OSA - continue CPAP  4. CKD 3b - baseline Scr 1.5-1.6 - recheck today  - Consider switching lasix to low-dose Farxiga   5. HTN - BP running on the low end with some othostasis - will cut doxazosin back to 4mg  daily - check labs. May also need to switch lasix to every other day  6. Mitral regurgitation - I reviewed TEE. Seems more moderate range - await repeat TTE next month - Can consider mTEER as needed. We discussed this possibility today.   Arvilla Meres, MD  10:34 AM

## 2023-02-03 DIAGNOSIS — R197 Diarrhea, unspecified: Secondary | ICD-10-CM | POA: Diagnosis not present

## 2023-02-04 ENCOUNTER — Encounter: Payer: Self-pay | Admitting: Cardiology

## 2023-02-04 ENCOUNTER — Ambulatory Visit: Payer: Medicare HMO | Attending: Cardiology | Admitting: Cardiology

## 2023-02-04 VITALS — BP 114/50 | HR 79 | Ht 65.0 in | Wt 138.4 lb

## 2023-02-04 DIAGNOSIS — G4733 Obstructive sleep apnea (adult) (pediatric): Secondary | ICD-10-CM | POA: Diagnosis not present

## 2023-02-04 NOTE — Patient Instructions (Signed)
Medication Instructions:  Your physician recommends that you continue on your current medications as directed. Please refer to the Current Medication list given to you today.  *If you need a refill on your cardiac medications before your next appointment, please call your pharmacy*   Lab Work: None.  If you have labs (blood work) drawn today and your tests are completely normal, you will receive your results only by: MyChart Message (if you have MyChart) OR A paper copy in the mail If you have any lab test that is abnormal or we need to change your treatment, we will call you to review the results.   Testing/Procedures: None.   Follow-Up:  Your next appointment will be dependent on delivery of new CPAP settings/equipment and it will be with:     Provider:   Dr. Armanda Magic, MD   Other Instructions Dr. Mayford Knife has ordered new Resmed CPAP at 9 cm H2), heated humidity and mask of choice. After receipt of this equipment, you will need to follow up with Dr. Mayford Knife. A member of our staff will call you to schedule you for that follow up appointment.

## 2023-02-04 NOTE — Progress Notes (Signed)
Evaluation Performed:  Follow-up visit  Date:  02/04/2023   ID:  [name], DOB 1935-02-15, MRN [id number]  PCP:  Chilton Greathouse, MD  Sleep Medicine:  Armanda Magic, MD Electrophysiologist:  None   Chief Complaint:  OSA, HTN  History of Present Illness:    [name] is a 88 y.o. male  with a hx of mild OSA with an AHI of 12/hr and oxygen desaturations as low as 79%.    At last office visit he was complaining that he felt like he was smothering on a set pressure of 9 cm H2O so he was changed to auto CPAP from 4 to 12 cm H2O put in for this never occurred and he is still on 9 cm H2O.  He is doing well with his PAP device and thinks that he has gotten used to it.  He tolerates the mask and feels the pressure is adequate.  He tells me that sometimes at night he takes his mask off in his sleep.  He has a lot of problems with mouth dryness.  He denies any significant nasal dryness or nasal congestion.  He does not think that he snores.  His device is over 35 years old and he would like a new CPAP.    Prior CV studies:   The following studies were reviewed today:  PAP compliance download  Past Medical History:  Diagnosis Date   Anemia    Arthritis    osteoarthritis  back   Celiac disease    Chronic kidney disease    Collagenous colitis    Diverticulosis    Enlarged prostate    followed by dr  [name]   Esophageal dysmotility    Esophageal dysmotility    Esophageal stenosis    Essential hypertension, benign 03/29/2014   External hemorrhoids    External hemorrhoids    Full dentures    GERD (gastroesophageal reflux disease)    Hemorrhoids    Hiatal hernia    IBS (irritable bowel syndrome)    Internal hemorrhoids    OSA on CPAP 10/17/2018   Osteoporosis    Postoperative anemia due to acute blood loss 03/29/2014   Schatzki's ring    Sleep apnea    CPAP Machine   Ulcer    Past Surgical History:  Procedure Laterality Date   APPENDECTOMY  1963   CARDIOVERSION N/A  12/16/2022   Procedure: CARDIOVERSION;  Surgeon: Lewayne Bunting, MD;  Location: [facility] ENDOSCOPY;  Service: Cardiovascular;  Laterality: N/A;   ESOPHAGOGASTRODUODENOSCOPY  01/09/2022   EYE SURGERY Bilateral    cataract removal - implants   HERNIA REPAIR  2002   right inguinal    HIP SURGERY Left    INGUINAL HERNIA REPAIR  08/19/2011   Procedure: HERNIA REPAIR  left INGUINAL ADULT;  Surgeon: Adolph Pollack, MD;  Location: [facility] ORS;  Service: General;  Laterality: Left;   INTRAMEDULLARY (IM) NAIL INTERTROCHANTERIC Left 03/27/2014   Procedure: INTRAMEDULLARY (IM) NAIL INTERTROCHANTRIC;  Surgeon: Shelda Pal, MD;  Location: [facility] ORS;  Service: Orthopedics;  Laterality: Left;   RIGHT/LEFT HEART CATH AND CORONARY ANGIOGRAPHY N/A 03/12/2017   Procedure: Right/Left Heart Cath and Coronary Angiography;  Surgeon: Dolores Patty, MD;  Location: [facility] INVASIVE CV LAB;  Service: Cardiovascular;  Laterality: N/A;   TEE WITHOUT CARDIOVERSION N/A 12/16/2022   Procedure: TRANSESOPHAGEAL ECHOCARDIOGRAM (TEE);  Surgeon: Lewayne Bunting, MD;  Location: [facility] ENDOSCOPY;  Service: Cardiovascular;  Laterality: N/A;   UPPER GASTROINTESTINAL ENDOSCOPY  2008  Current Meds  Medication Sig   acetaminophen (TYLENOL) 650 MG CR tablet Take 650 mg by mouth every evening.   albuterol (PROVENTIL HFA;VENTOLIN HFA) 108 (90 Base) MCG/ACT inhaler Inhale 2 puffs into the lungs every 6 (six) hours as needed for wheezing or shortness of breath.   alendronate (FOSAMAX) 70 MG tablet Take 70 mg by mouth every Monday.   apixaban (ELIQUIS) 5 MG TABS tablet Take 2.5 mg by mouth 2 (two) times daily.   diclofenac Sodium (VOLTAREN) 1 % GEL Apply 2 g topically 4 (four) times daily as needed (pain.).   dicyclomine (BENTYL) 10 MG capsule Take 10 mg by mouth 2 (two) times daily as needed (abdominal pain/spasms.).   DOCUSATE SODIUM PO Take 100 mg by mouth daily as needed.   doxazosin (CARDURA) 8 MG tablet Take 4 mg by mouth daily.    famotidine (PEPCID) 40 MG tablet Take 1 tablet (40 mg total) by mouth 2 (two) times daily.   ferrous sulfate 325 (65 FE) MG tablet Take 325 mg by mouth at bedtime.   flecainide (TAMBOCOR) 100 MG tablet Take 1 tablet (100 mg total) by mouth 2 (two) times daily.   furosemide (LASIX) 40 MG tablet Take 1 tablet (40 mg total) by mouth daily.   gabapentin (NEURONTIN) 600 MG tablet Take 600 mg by mouth at bedtime.   HYDROcodone-acetaminophen (NORCO) 5-325 MG per tablet Take 1-2 tablets by mouth every 6 (six) hours as needed.   loratadine (CLARITIN) 10 MG tablet Take 10 mg by mouth in the morning.   magnesium oxide (MAG-OX) 400 (240 Mg) MG tablet Take 400 mg by mouth in the morning.   montelukast (SINGULAIR) 10 MG tablet Take 10 mg by mouth in the morning.   Polyethyl Glycol-Propyl Glycol (SYSTANE OP) Place 1 drop into both eyes 3 (three) times daily as needed (dry/irritated eyes.).   potassium chloride SA (KLOR-CON M) 20 MEQ tablet Take 1 tablet (20 mEq total) by mouth daily with supper.   sertraline (ZOLOFT) 50 MG tablet Take 25 mg by mouth at bedtime.   tiZANidine (ZANAFLEX) 4 MG capsule Take 4 mg by mouth as needed for muscle spasms.   zolpidem (AMBIEN) 10 MG tablet Take 5 mg by mouth at bedtime as needed for sleep.   [DISCONTINUED] apixaban (ELIQUIS) 2.5 MG TABS tablet Take 0.5 tablets by mouth 2 (two) times daily.     Allergies:   Wheat   Social History   Tobacco Use   Smoking status: Former    Types: Cigarettes    Quit date: 08/11/1981    Years since quitting: 41.5   Smokeless tobacco: Never  Vaping Use   Vaping Use: Never used  Substance Use Topics   Alcohol use: Yes    Comment: ocassionally   Drug use: No     Family Hx: The patient's family history includes Brain cancer in his brother; Breast cancer in his sister; Cervical cancer in his mother; Colon cancer in his mother; Heart attack in his father; Liver cancer in his brother; Stroke in his father; Throat cancer in his brother.  There is no history of Esophageal cancer, Rectal cancer, or Stomach cancer.  ROS:   Please see the history of present illness.     All other systems reviewed and are negative.   Labs/Other Tests and Data Reviewed:    Recent Labs: 12/10/2022: NT-Pro BNP 2,546 12/12/2022: TSH 1.595 01/28/2023: B Natriuretic Peptide 134.4; BUN 24; Creatinine, Ser 1.94; Hemoglobin 12.0; Platelets 175; Potassium 4.2; Sodium 137  Recent Lipid Panel Lab Results  Component Value Date/Time   CHOL  04/25/2009 01:55 AM    134        ATP III CLASSIFICATION:  <200     mg/dL   Desirable  604-540  mg/dL   Borderline High  >=981    mg/dL   High          TRIG 57 04/25/2009 01:55 AM   HDL 73 04/25/2009 01:55 AM   CHOLHDL 1.8 04/25/2009 01:55 AM   LDLCALC  04/25/2009 01:55 AM    50        Total Cholesterol/HDL:CHD Risk Coronary Heart Disease Risk Table                     Men   Women  1/2 Average Risk   3.4   3.3  Average Risk       5.0   4.4  2 X Average Risk   9.6   7.1  3 X Average Risk  23.4   11.0        Use the calculated Patient Ratio above and the CHD Risk Table to determine the patient's CHD Risk.        ATP III CLASSIFICATION (LDL):  <100     mg/dL   Optimal  191-478  mg/dL   Near or Above                    Optimal  130-159  mg/dL   Borderline  295-621  mg/dL   High  >308     mg/dL   Very High    Wt Readings from Last 3 Encounters:  02/04/23 138 lb 6.4 oz (62.8 kg)  01/28/23 139 lb (63 kg)  01/16/23 136 lb 6.4 oz (61.9 kg)     Objective:    Vital Signs:  BP (!) 114/50   Pulse 79   Ht  (1.651 m)   Wt 138 lb 6.4 oz (62.8 kg)   SpO2 96%   BMI 23.03 kg/m   GEN: Well nourished, well developed in no acute distress HEENT: Normal NECK: No JVD; No carotid bruits LYMPHATICS: No lymphadenopathy CARDIAC:RRR, no murmurs, rubs, gallops RESPIRATORY:  Clear to auscultation without rales, wheezing or rhonchi  ABDOMEN: Soft, non-tender, non-distended MUSCULOSKELETAL:  No edema; No  deformity  SKIN: Warm and dry NEUROLOGIC:  Alert and oriented x 3 PSYCHIATRIC:  Normal affect  ASSESSMENT & PLAN:    1. OSA - The patient is tolerating PAP therapy well without any problems. The PAP download performed by his DME was personally reviewed and interpreted by me today and showed an AHI of 2.8 /hr on 9 cm H2O with 77 % compliance in using more than 4 hours nightly.  The patient has been using and benefiting from PAP use and will continue to benefit from therapy.  -I will order him a new ResMed CPAP at 9cm H2o with heated humidity and mask of choice -he will need followup in 6 weeks after getting his new device per insurance requirements.    2.  HTN  -BP is controlled on exam -Continue prescription drug management with doxazosin 4 mg daily with as needed refills  Medication Adjustments/Labs and Tests Ordered: Current medicines are reviewed at length with the patient today.  Concerns regarding medicines are outlined above.  Tests Ordered: No orders of the defined types were placed in this encounter.  Medication Changes: No orders of the defined types were placed in  this encounter.   Disposition:  Follow up in 1 year(s)  Signed, Armanda Magic, MD  02/04/2023 9:47 AM    [facility]

## 2023-02-10 ENCOUNTER — Ambulatory Visit: Payer: Medicare PPO | Admitting: Cardiology

## 2023-02-17 ENCOUNTER — Ambulatory Visit (HOSPITAL_COMMUNITY): Payer: Medicare HMO | Attending: Cardiology

## 2023-02-17 DIAGNOSIS — I340 Nonrheumatic mitral (valve) insufficiency: Secondary | ICD-10-CM | POA: Diagnosis not present

## 2023-02-17 DIAGNOSIS — I4819 Other persistent atrial fibrillation: Secondary | ICD-10-CM

## 2023-02-17 DIAGNOSIS — I10 Essential (primary) hypertension: Secondary | ICD-10-CM

## 2023-02-17 LAB — ECHOCARDIOGRAM COMPLETE
Area-P 1/2: 2.37 cm2
MV M vel: 4.92 m/s
MV Peak grad: 96.8 mmHg
S' Lateral: 2.4 cm

## 2023-03-23 DIAGNOSIS — R413 Other amnesia: Secondary | ICD-10-CM | POA: Diagnosis not present

## 2023-03-23 DIAGNOSIS — D649 Anemia, unspecified: Secondary | ICD-10-CM | POA: Diagnosis not present

## 2023-03-23 DIAGNOSIS — I4891 Unspecified atrial fibrillation: Secondary | ICD-10-CM | POA: Diagnosis not present

## 2023-03-23 DIAGNOSIS — I509 Heart failure, unspecified: Secondary | ICD-10-CM | POA: Diagnosis not present

## 2023-03-23 DIAGNOSIS — F0390 Unspecified dementia without behavioral disturbance: Secondary | ICD-10-CM | POA: Diagnosis not present

## 2023-03-23 DIAGNOSIS — K900 Celiac disease: Secondary | ICD-10-CM | POA: Diagnosis not present

## 2023-03-23 DIAGNOSIS — N1831 Chronic kidney disease, stage 3a: Secondary | ICD-10-CM | POA: Diagnosis not present

## 2023-03-23 DIAGNOSIS — E785 Hyperlipidemia, unspecified: Secondary | ICD-10-CM | POA: Diagnosis not present

## 2023-03-23 DIAGNOSIS — I130 Hypertensive heart and chronic kidney disease with heart failure and stage 1 through stage 4 chronic kidney disease, or unspecified chronic kidney disease: Secondary | ICD-10-CM | POA: Diagnosis not present

## 2023-03-24 DIAGNOSIS — M25552 Pain in left hip: Secondary | ICD-10-CM | POA: Diagnosis not present

## 2023-03-24 DIAGNOSIS — M1612 Unilateral primary osteoarthritis, left hip: Secondary | ICD-10-CM | POA: Diagnosis not present

## 2023-04-06 DIAGNOSIS — H52203 Unspecified astigmatism, bilateral: Secondary | ICD-10-CM | POA: Diagnosis not present

## 2023-04-06 DIAGNOSIS — H04123 Dry eye syndrome of bilateral lacrimal glands: Secondary | ICD-10-CM | POA: Diagnosis not present

## 2023-04-06 DIAGNOSIS — H524 Presbyopia: Secondary | ICD-10-CM | POA: Diagnosis not present

## 2023-04-06 DIAGNOSIS — Z961 Presence of intraocular lens: Secondary | ICD-10-CM | POA: Diagnosis not present

## 2023-04-12 NOTE — Progress Notes (Unsigned)
Cardiology Office Note   Date:  04/14/2023   ID:  [name], DOB 06-17-1935, MRN [id number]  PCP:  Chilton Greathouse, MD  Cardiologist:   Mariaclara Spear [place], MD   Chief Complaint  Patient presents with   Atrial Fibrillation   Congestive Heart Failure      History of Present Illness: [name] is a 88 y.o. male who is seen for follow up. I care for his wife [name] and he is Dr [name] father in law. He has a history of HTN and OSA on CPAP. Prior Myoview in 2010 was normal. He had a cardiac cath in 2018 showing normal coronaries and normal right heart hemodynamics. Echo in 2018 showed mild MR-otherwise normal. He is followed by Dr Mayford Knife for OSA. Has restless leg syndrome. Prior arterial dopplers normal.   He was seen 3 weeks ago by Dr Felipa Eth for general physical and was doing well. About 9 days ago he developed worsening SOB and edema. Noted dry cough. Was seen back at Dr Vicente Males office and found to be in AFib with rate 110s. Was placed on Eliquis and metoprolol.  Lasix dose was increased to 80 mg daily from 40 mg daily. On follow up Monday creatinine had increased from 1.4 to 1.9 so lasix was reduced to 40 mg daily. CXR done. Was placed on antibiotics for possible PNA. Swelling has improved but he still complains of SOB at rest, orthopnea. Weight did go up to 152 but has improved. He denies any palpitations. Does note slight chest pain at times.   We had initially increased lasix to 40 mg in am and 20 mg in PM. Creatinine repeat was up to 2.1. Pro  BNP was elevated to 2546. BNP to 719. He was seen in office by Dr Gala Romney on March 1 and given IV Furoscix. He did have a good diuresis with this but he felt really wiped out with this. Bedside Echo showed normal EF. On March 5 he underwent TEE by Dr Jens Som. This showed normal LV function. Moderate bilatrial enlargement. Severe MR. Mild RV dysfunction. No thrombus. He subsequently had successful DCCV to NSR with single shock.   He had early  return of AFib and was symptomatic. He was started on Flecainide 50 mg bid. Initially converted on therapy but then had recurrence again. Flecainide was increased to 100 mg bid and he converted again. He then developed persistent bradycardia with pulse in the 40s. Metoprolol was discontinued with improvement.   Repeat Echo done in NSR showed normal LV function with only moderate MR.   On follow up today he is feeling very well. No SOB or chest pain. No recurrent Afib since on 100 mg bid Flecainide. He did have a protracted course of C diff in April and forgot to take Flecainide. He did have recurrent Afib. Once back on Flecainide no more Afib. Appetite is better. Wife has been quite ill as well and is in the hospital.   Past Medical History:  Diagnosis Date   Anemia    Arthritis    osteoarthritis  back   Celiac disease    Chronic kidney disease    Collagenous colitis    Diverticulosis    Enlarged prostate    followed by dr  [name]   Esophageal dysmotility    Esophageal dysmotility    Esophageal stenosis    Essential hypertension, benign 03/29/2014   External hemorrhoids    External hemorrhoids    Full dentures  GERD (gastroesophageal reflux disease)    Hemorrhoids    Hiatal hernia    IBS (irritable bowel syndrome)    Internal hemorrhoids    OSA on CPAP 10/17/2018   Osteoporosis    Postoperative anemia due to acute blood loss 03/29/2014   Schatzki's ring    Sleep apnea    CPAP Machine   Ulcer     Past Surgical History:  Procedure Laterality Date   APPENDECTOMY  1963   CARDIOVERSION N/A 12/16/2022   Procedure: CARDIOVERSION;  Surgeon: Lewayne Bunting, MD;  Location: [facility] ENDOSCOPY;  Service: Cardiovascular;  Laterality: N/A;   ESOPHAGOGASTRODUODENOSCOPY  01/09/2022   EYE SURGERY Bilateral    cataract removal - implants   HERNIA REPAIR  2002   right inguinal    HIP SURGERY Left    INGUINAL HERNIA REPAIR  08/19/2011   Procedure: HERNIA REPAIR  left INGUINAL ADULT;   Surgeon: Adolph Pollack, MD;  Location: [facility] ORS;  Service: General;  Laterality: Left;   INTRAMEDULLARY (IM) NAIL INTERTROCHANTERIC Left 03/27/2014   Procedure: INTRAMEDULLARY (IM) NAIL INTERTROCHANTRIC;  Surgeon: Shelda Pal, MD;  Location: [facility] ORS;  Service: Orthopedics;  Laterality: Left;   RIGHT/LEFT HEART CATH AND CORONARY ANGIOGRAPHY N/A 03/12/2017   Procedure: Right/Left Heart Cath and Coronary Angiography;  Surgeon: Dolores Patty, MD;  Location: [facility] INVASIVE CV LAB;  Service: Cardiovascular;  Laterality: N/A;   TEE WITHOUT CARDIOVERSION N/A 12/16/2022   Procedure: TRANSESOPHAGEAL ECHOCARDIOGRAM (TEE);  Surgeon: Lewayne Bunting, MD;  Location: [facility] ENDOSCOPY;  Service: Cardiovascular;  Laterality: N/A;   UPPER GASTROINTESTINAL ENDOSCOPY  2008     Current Outpatient Medications  Medication Sig Dispense Refill   acetaminophen (TYLENOL) 650 MG CR tablet Take 650 mg by mouth every evening.     albuterol (PROVENTIL HFA;VENTOLIN HFA) 108 (90 Base) MCG/ACT inhaler Inhale 2 puffs into the lungs every 6 (six) hours as needed for wheezing or shortness of breath.     alendronate (FOSAMAX) 70 MG tablet Take 70 mg by mouth every Monday.     amLODipine (NORVASC) 10 MG tablet Take 10 mg by mouth daily.     apixaban (ELIQUIS) 5 MG TABS tablet Take 2.5 mg by mouth 2 (two) times daily.     diclofenac Sodium (VOLTAREN) 1 % GEL Apply 2 g topically 4 (four) times daily as needed (pain.).     dicyclomine (BENTYL) 10 MG capsule Take 10 mg by mouth 2 (two) times daily as needed (abdominal pain/spasms.).     DOCUSATE SODIUM PO Take 100 mg by mouth daily as needed.     doxazosin (CARDURA) 8 MG tablet Take 4 mg by mouth daily.     famotidine (PEPCID) 40 MG tablet Take 1 tablet (40 mg total) by mouth 2 (two) times daily. 180 tablet 2   ferrous sulfate 325 (65 FE) MG tablet Take 325 mg by mouth at bedtime.     flecainide (TAMBOCOR) 100 MG tablet Take 1 tablet (100 mg total) by mouth 2 (two) times daily. 180  tablet 3   furosemide (LASIX) 40 MG tablet Take 1 tablet (40 mg total) by mouth daily. 90 tablet 3   gabapentin (NEURONTIN) 600 MG tablet Take 600 mg by mouth at bedtime.     HYDROcodone-acetaminophen (NORCO) 5-325 MG per tablet Take 1-2 tablets by mouth every 6 (six) hours as needed. 90 tablet 0   loratadine (CLARITIN) 10 MG tablet Take 10 mg by mouth in the morning.     magnesium oxide (MAG-OX)  400 (240 Mg) MG tablet Take 400 mg by mouth in the morning.     montelukast (SINGULAIR) 10 MG tablet Take 10 mg by mouth in the morning.     Polyethyl Glycol-Propyl Glycol (SYSTANE OP) Place 1 drop into both eyes 3 (three) times daily as needed (dry/irritated eyes.).     potassium chloride SA (KLOR-CON M) 20 MEQ tablet Take 1 tablet (20 mEq total) by mouth daily with supper. 90 tablet 3   sertraline (ZOLOFT) 50 MG tablet Take 25 mg by mouth at bedtime.     tiZANidine (ZANAFLEX) 4 MG capsule Take 4 mg by mouth as needed for muscle spasms.     zolpidem (AMBIEN) 10 MG tablet Take 5 mg by mouth at bedtime as needed for sleep.     No current facility-administered medications for this visit.    Allergies:   Wheat    Social History:  The patient  reports that he quit smoking about 41 years ago. His smoking use included cigarettes. He has never used smokeless tobacco. He reports current alcohol use. He reports that he does not use drugs.   Family History:  The patient's family history includes Brain cancer in his brother; Breast cancer in his sister; Cervical cancer in his mother; Colon cancer in his mother; Heart attack in his father; Liver cancer in his brother; Stroke in his father; Throat cancer in his brother.    ROS:  Please see the history of present illness.   Otherwise, review of systems are positive for none.   All other systems are reviewed and negative.    PHYSICAL EXAM: VS:  BP 118/60 (BP Location: Left Arm, Patient Position: Sitting, Cuff Size: Normal)   Pulse 77   Ht 5\' 5"  (1.651 m)    Wt 141 lb 6.4 oz (64.1 kg)   SpO2 96%   BMI 23.53 kg/m  , BMI Body mass index is 23.53 kg/m. GEN: Well nourished, well developed, in no acute distress HEENT: normal Neck: JVD flat  carotid bruits, or masses Cardiac: RRR gr 1/6 systolic murmur at the apex. No rubs, or gallops,no edema  Respiratory:  clear GI: soft, nontender, nondistended, + BS MS: no deformity or atrophy. Pedal pulse are good. Trace edema. Skin: warm and dry, no rash Neuro:  Strength and sensation are intact Psych: euthymic mood, full affect   EKG:  EKG is not ordered today.   Recent Labs: 12/10/2022: NT-Pro BNP 2,546 12/12/2022: TSH 1.595 01/28/2023: B Natriuretic Peptide 134.4; BUN 24; Creatinine, Ser 1.94; Hemoglobin 12.0; Platelets 175; Potassium 4.2; Sodium 137   Dated 11/06/20: cholesterol 158, triglycerides 119, HDL 49, LDL 85, creatinine 1.5. otherwise CMET normal. TSH normal  Dated 11/12/21: cholesterol 169, triglycerides 100, HDL 50, LDL 99. LFTs normal.    Lipid Panel    Component Value Date/Time   CHOL  04/25/2009 0155    134        ATP III CLASSIFICATION:  <200     mg/dL   Desirable  161-096  mg/dL   Borderline High  >=045    mg/dL   High          TRIG 57 04/25/2009 0155   HDL 73 04/25/2009 0155   CHOLHDL 1.8 04/25/2009 0155   VLDL 11 04/25/2009 0155   LDLCALC  04/25/2009 0155    50        Total Cholesterol/HDL:CHD Risk Coronary Heart Disease Risk Table  Men   Women  1/2 Average Risk   3.4   3.3  Average Risk       5.0   4.4  2 X Average Risk   9.6   7.1  3 X Average Risk  23.4   11.0        Use the calculated Patient Ratio above and the CHD Risk Table to determine the patient's CHD Risk.        ATP III CLASSIFICATION (LDL):  <100     mg/dL   Optimal  045-409  mg/dL   Near or Above                    Optimal  130-159  mg/dL   Borderline  811-914  mg/dL   High  >782     mg/dL   Very High      Wt Readings from Last 3 Encounters:  04/14/23 141 lb 6.4 oz (64.1  kg)  02/04/23 138 lb 6.4 oz (62.8 kg)  01/28/23 139 lb (63 kg)      Other studies Reviewed: Additional studies/ records that were reviewed today include:   Cardiac cath 03/12/17:  Right/Left Heart Cath and Coronary Angiography   Conclusion    The left ventricular ejection fraction is 55-65% by visual estimate.   Findings:   RA = 2 RV = 24/1 PA = 22/3 (13) PCW = 3 Fick cardiac output/index = 5.9/3.4 PVR = 1.7 WU Ao sat = 97% PA sat = 70%, 74%   Assessment: 1. Normal hemodynamics 2. EF 60% 3. Normal coronary arteries   Plan/Discussion:   Normal cath. Continue medical management.    Arvilla Meres, MD  11:07 AM   Echo 03/19/17: Study Conclusions   - Left ventricle: The cavity size was normal. Systolic function was    normal. The estimated ejection fraction was in the range of 55%    to 60%. Wall motion was normal; there were no regional wall    motion abnormalities.  - Mitral valve: There was mild regurgitation.  - Right atrium: Cannot r/o RA cor triatriatom should not be of any    clinical significance.  - Atrial septum: No defect or patent foramen ovale was identified.  - Pulmonary arteries: PA peak pressure: 34 mm Hg (S).   LE dopplers 06/11/21: Summary:  Right: Resting right ankle-brachial index indicates noncompressible right  lower extremity arteries. The right toe-brachial index is normal. Brisk,  multiphasic waveforms from the groin to ankle consistent with no evidence  of significant peripheral  arterial disease.   Left: Resting left ankle-brachial index is within normal range. No  evidence of significant left lower extremity arterial disease. The left  toe-brachial index is mildly abnormal. Brisk, multiphasic waveforms from  the groin to ankle consistent with no  evidence of significant peripheral arterial disease.     *See table(s) above for measurements and observations.   Echo 02/17/23: IMPRESSIONS     1. Left ventricular ejection fraction,  by estimation, is 55 to 60%. The  left ventricle has normal function. The left ventricle has no regional  wall motion abnormalities. Left ventricular diastolic parameters are  consistent with Grade I diastolic  dysfunction (impaired relaxation).   2. Right ventricular systolic function is normal. The right ventricular  size is mildly enlarged. There is mildly elevated pulmonary artery  systolic pressure. The estimated right ventricular systolic pressure is  37.6 mmHg.   3. Left atrial size was moderately dilated.   4.  Right atrial size was moderately dilated.   5. The mitral valve is normal in structure. Mild to moderate mitral valve  regurgitation. No evidence of mitral stenosis.   6. The aortic valve is tricuspid. There is mild calcification of the  aortic valve. There is mild thickening of the aortic valve. Aortic valve  regurgitation is not visualized. Aortic valve sclerosis is present, with  no evidence of aortic valve stenosis.   7. The inferior vena cava is dilated in size with >50% respiratory  variability, suggesting right atrial pressure of 8 mmHg.   Comparison(s): Changes from prior study are noted. Compared to TEE from  12/16/22, MR improved, now in sinus rhythm.    ASSESSMENT AND PLAN:  1.  Atrial fibrillation new onset associated with acute diastolic CHF. On Eliquis. TEE showed  biatrial enlargement and severe MR. Symptoms clearly improved with restoration of NSR. He had Early return of Afib post DCCV. Converted on Flecainide 50 mg bid but then had recurrence again. Converted on 100 mg bid and has maintained NSR as long as he is taking it.  Metoprolol discontinued due to bradycardia.  2. Chronic  diastolic CHF. Volume status is normal. No edema. Continue lasix 40 mg daily 3.  HTN controlled.  3. OSA on CPAP 4. MR. Was severe on TEE but exam today is really unimpressive. Repeat Echo in NSR showed only moderate MR. Will continue medical management.   Current medicines are  reviewed at length with the patient today.  The patient does not have concerns regarding medicines.  No change in therapy  Labs/ tests ordered today include:   None  Follow up   Signed, Lorma Heater [place], MD  04/14/2023 8:52 AM    [facility] [address] [place], [place], [address] Phone [phone], Fax [phone]

## 2023-04-14 ENCOUNTER — Encounter: Payer: Self-pay | Admitting: Cardiology

## 2023-04-14 ENCOUNTER — Ambulatory Visit: Payer: Medicare HMO | Attending: Cardiology | Admitting: Cardiology

## 2023-04-14 VITALS — BP 118/60 | HR 77 | Ht 65.0 in | Wt 141.4 lb

## 2023-04-14 DIAGNOSIS — I340 Nonrheumatic mitral (valve) insufficiency: Secondary | ICD-10-CM

## 2023-04-14 DIAGNOSIS — I5032 Chronic diastolic (congestive) heart failure: Secondary | ICD-10-CM | POA: Diagnosis not present

## 2023-04-14 DIAGNOSIS — I480 Paroxysmal atrial fibrillation: Secondary | ICD-10-CM

## 2023-04-14 DIAGNOSIS — I10 Essential (primary) hypertension: Secondary | ICD-10-CM | POA: Diagnosis not present

## 2023-04-14 NOTE — Patient Instructions (Signed)
Medication Instructions:  Continue same medications *If you need a refill on your cardiac medications before your next appointment, please call your pharmacy*   Lab Work: None ordered   Testing/Procedures: None ordered   Follow-Up: At [facility], you and your health needs are our priority.  As part of our continuing mission to provide you with exceptional heart care, we have created designated Provider Care Teams.  These Care Teams include your primary Cardiologist (physician) and Advanced Practice Providers (APPs -  Physician Assistants and Nurse Practitioners) who all work together to provide you with the care you need, when you need it.  We recommend signing up for the patient portal called "MyChart".  Sign up information is provided on this After Visit Summary.  MyChart is used to connect with patients for Virtual Visits (Telemedicine).  Patients are able to view lab/test results, encounter notes, upcoming appointments, etc.  Non-urgent messages can be sent to your provider as well.   To learn more about what you can do with MyChart, go to [email or web address].    Your next appointment:  4 months    Provider:  Dr.Jordan   

## 2023-04-21 DIAGNOSIS — M7062 Trochanteric bursitis, left hip: Secondary | ICD-10-CM | POA: Diagnosis not present

## 2023-04-24 DIAGNOSIS — M7062 Trochanteric bursitis, left hip: Secondary | ICD-10-CM | POA: Diagnosis not present

## 2023-05-06 DIAGNOSIS — R058 Other specified cough: Secondary | ICD-10-CM | POA: Diagnosis not present

## 2023-05-06 DIAGNOSIS — J069 Acute upper respiratory infection, unspecified: Secondary | ICD-10-CM | POA: Diagnosis not present

## 2023-05-06 DIAGNOSIS — J309 Allergic rhinitis, unspecified: Secondary | ICD-10-CM | POA: Diagnosis not present

## 2023-05-06 DIAGNOSIS — I4891 Unspecified atrial fibrillation: Secondary | ICD-10-CM | POA: Diagnosis not present

## 2023-05-06 DIAGNOSIS — G4733 Obstructive sleep apnea (adult) (pediatric): Secondary | ICD-10-CM | POA: Diagnosis not present

## 2023-05-07 ENCOUNTER — Ambulatory Visit: Payer: Medicare HMO | Admitting: Cardiology

## 2023-05-25 ENCOUNTER — Ambulatory Visit: Payer: Medicare HMO

## 2023-05-25 ENCOUNTER — Telehealth: Payer: Self-pay | Admitting: Cardiology

## 2023-05-25 VITALS — BP 158/75 | HR 65 | Wt 142.2 lb

## 2023-05-25 DIAGNOSIS — I4819 Other persistent atrial fibrillation: Secondary | ICD-10-CM | POA: Diagnosis not present

## 2023-05-25 NOTE — Telephone Encounter (Signed)
STAT if patient feels like he/she is going to faint   1. Are you feeling dizzy, lightheaded, or faint right now? Wife states not right now, has been going on since last Friday.     2. Have you passed out?  no (If yes move to .[facility])    3. Do you have any other symptoms? no   4. Have you checked your HR and BP (record if available)? BP  143/72   After I got BP call was disconnected.

## 2023-05-25 NOTE — Telephone Encounter (Signed)
Spoke with wife and advised of provider message  His vitals look OK. Any swelling or weight gain? It is possible he is in Afib- could come to office for Ecg.  Peter [place]il[place] MD, [facility]    She states no swelling or any issues.  His watch does not show A-fib.

## 2023-05-25 NOTE — Patient Instructions (Signed)
Patient into clinic for ECG per Dr Swaziland to see if Afib cause of episodes of dizziness.  Patient EXG done and vitals with BP initially at 138/74.  Dr Swaziland reviewed ECG and states patient currently in NSR.  Advised to monitor BP readings and send readings for review.  Advised to take BP each morning, evening and with episodes as described.  He will send via My chart in one week and then at 2 weeks unless concerns and then will send sooner.

## 2023-05-25 NOTE — Telephone Encounter (Signed)
Patient wife states  patient feeling dizziness and lightheaded off and on since Friday.  No specific activity, walking or sitting at times.  Friday during an episode  BP 133/82  HR 77 Yesterday during an episode  BP 143/72  HR 72 This morning 136/86  HR 89  New onset of SOB since started.  No chest pain, nausea, nor sweating. She states he had an episode 2 weeks ago and black spots.  He had not had another since until this weekend. That time he had not eaten but thes other episodes he has.   States no longer on Amlodipine Eliquis 2.5 BID Lasix 40 mg Daily Flecainide 100 mg BID  No longer on pain meds.  No ear pain .  Hydrated well.  Please advise.

## 2023-05-25 NOTE — Telephone Encounter (Signed)
He will come to office today for EKG.  Placed on nurse schedule for 1:30 pm

## 2023-05-26 ENCOUNTER — Emergency Department (HOSPITAL_COMMUNITY): Payer: Medicare HMO

## 2023-05-26 ENCOUNTER — Other Ambulatory Visit: Payer: Self-pay

## 2023-05-26 ENCOUNTER — Observation Stay (HOSPITAL_COMMUNITY)
Admission: EM | Admit: 2023-05-26 | Discharge: 2023-05-27 | Disposition: A | Discharge status: Home / Self Care | Payer: Medicare HMO | Attending: Emergency Medicine | Admitting: Emergency Medicine

## 2023-05-26 DIAGNOSIS — J324 Chronic pansinusitis: Secondary | ICD-10-CM | POA: Diagnosis not present

## 2023-05-26 DIAGNOSIS — Z7901 Long term (current) use of anticoagulants: Secondary | ICD-10-CM | POA: Insufficient documentation

## 2023-05-26 DIAGNOSIS — Z1152 Encounter for screening for COVID-19: Secondary | ICD-10-CM | POA: Insufficient documentation

## 2023-05-26 DIAGNOSIS — R410 Disorientation, unspecified: Secondary | ICD-10-CM

## 2023-05-26 DIAGNOSIS — I5030 Unspecified diastolic (congestive) heart failure: Secondary | ICD-10-CM | POA: Insufficient documentation

## 2023-05-26 DIAGNOSIS — I130 Hypertensive heart and chronic kidney disease with heart failure and stage 1 through stage 4 chronic kidney disease, or unspecified chronic kidney disease: Secondary | ICD-10-CM | POA: Diagnosis not present

## 2023-05-26 DIAGNOSIS — G459 Transient cerebral ischemic attack, unspecified: Secondary | ICD-10-CM | POA: Diagnosis not present

## 2023-05-26 DIAGNOSIS — G4733 Obstructive sleep apnea (adult) (pediatric): Secondary | ICD-10-CM

## 2023-05-26 DIAGNOSIS — Z87891 Personal history of nicotine dependence: Secondary | ICD-10-CM | POA: Insufficient documentation

## 2023-05-26 DIAGNOSIS — E876 Hypokalemia: Secondary | ICD-10-CM | POA: Insufficient documentation

## 2023-05-26 DIAGNOSIS — Z79899 Other long term (current) drug therapy: Secondary | ICD-10-CM | POA: Diagnosis not present

## 2023-05-26 DIAGNOSIS — I10 Essential (primary) hypertension: Secondary | ICD-10-CM | POA: Diagnosis present

## 2023-05-26 DIAGNOSIS — R42 Dizziness and giddiness: Principal | ICD-10-CM | POA: Insufficient documentation

## 2023-05-26 DIAGNOSIS — I160 Hypertensive urgency: Secondary | ICD-10-CM | POA: Diagnosis not present

## 2023-05-26 DIAGNOSIS — I4821 Permanent atrial fibrillation: Secondary | ICD-10-CM | POA: Diagnosis not present

## 2023-05-26 DIAGNOSIS — J0140 Acute pansinusitis, unspecified: Secondary | ICD-10-CM

## 2023-05-26 DIAGNOSIS — I480 Paroxysmal atrial fibrillation: Secondary | ICD-10-CM | POA: Diagnosis present

## 2023-05-26 DIAGNOSIS — N189 Chronic kidney disease, unspecified: Secondary | ICD-10-CM | POA: Insufficient documentation

## 2023-05-26 DIAGNOSIS — H8120 Vestibular neuronitis, unspecified ear: Secondary | ICD-10-CM

## 2023-05-26 DIAGNOSIS — I6782 Cerebral ischemia: Secondary | ICD-10-CM | POA: Diagnosis not present

## 2023-05-26 LAB — CBC WITH DIFFERENTIAL/PLATELET
Abs Immature Granulocytes: 0.05 10*3/uL (ref 0.00–0.07)
Basophils Absolute: 0 10*3/uL (ref 0.0–0.1)
Basophils Relative: 0 %
Eosinophils Absolute: 0.1 10*3/uL (ref 0.0–0.5)
Eosinophils Relative: 1 %
HCT: 39.3 % (ref 39.0–52.0)
Hemoglobin: 12.5 g/dL — ABNORMAL LOW (ref 13.0–17.0)
Immature Granulocytes: 1 %
Lymphocytes Relative: 9 %
Lymphs Abs: 0.7 10*3/uL (ref 0.7–4.0)
MCH: 30.7 pg (ref 26.0–34.0)
MCHC: 31.8 g/dL (ref 30.0–36.0)
MCV: 96.6 fL (ref 80.0–100.0)
Monocytes Absolute: 0.5 10*3/uL (ref 0.1–1.0)
Monocytes Relative: 7 %
Neutro Abs: 6.6 10*3/uL (ref 1.7–7.7)
Neutrophils Relative %: 82 %
Platelets: 181 10*3/uL (ref 150–400)
RBC: 4.07 MIL/uL — ABNORMAL LOW (ref 4.22–5.81)
RDW: 15 % (ref 11.5–15.5)
WBC: 8 10*3/uL (ref 4.0–10.5)
nRBC: 0 % (ref 0.0–0.2)

## 2023-05-26 LAB — COMPREHENSIVE METABOLIC PANEL
ALT: 17 U/L (ref 0–44)
AST: 21 U/L (ref 15–41)
Albumin: 4 g/dL (ref 3.5–5.0)
Alkaline Phosphatase: 54 U/L (ref 38–126)
Anion gap: 8 (ref 5–15)
BUN: 19 mg/dL (ref 8–23)
CO2: 26 mmol/L (ref 22–32)
Calcium: 8.6 mg/dL — ABNORMAL LOW (ref 8.9–10.3)
Chloride: 104 mmol/L (ref 98–111)
Creatinine, Ser: 1.3 mg/dL — ABNORMAL HIGH (ref 0.61–1.24)
GFR, Estimated: 54 mL/min — ABNORMAL LOW (ref >60)
Glucose, Bld: 175 mg/dL — ABNORMAL HIGH (ref 70–99)
Potassium: 3.2 mmol/L — ABNORMAL LOW (ref 3.5–5.1)
Sodium: 138 mmol/L (ref 135–145)
Total Bilirubin: 0.6 mg/dL (ref 0.3–1.2)
Total Protein: 7.4 g/dL (ref 6.5–8.1)

## 2023-05-26 LAB — RESP PANEL BY RT-PCR (RSV, FLU A&B, COVID)  RVPGX2
Influenza A by PCR: NEGATIVE
Influenza B by PCR: NEGATIVE
Resp Syncytial Virus by PCR: NEGATIVE
SARS Coronavirus 2 by RT PCR: NEGATIVE

## 2023-05-26 LAB — TROPONIN I (HIGH SENSITIVITY)
Troponin I (High Sensitivity): 6 ng/L (ref <18)
Troponin I (High Sensitivity): 5 ng/L (ref <18)

## 2023-05-26 MED ORDER — PREDNISONE 50 MG PO TABS
50.0000 mg | ORAL_TABLET | Freq: Every day | ORAL | Status: DC
  Administered 2023-05-26 – 2023-05-27 (×2): 50 mg via ORAL
  Filled 2023-05-26 (×2): qty 1

## 2023-05-26 MED ORDER — AMLODIPINE BESYLATE 5 MG PO TABS
10.0000 mg | ORAL_TABLET | Freq: Once | ORAL | Status: AC
  Administered 2023-05-26: 10 mg via ORAL
  Filled 2023-05-26: qty 2

## 2023-05-26 MED ORDER — ACETAMINOPHEN 500 MG PO TABS
1000.0000 mg | ORAL_TABLET | Freq: Four times a day (QID) | ORAL | Status: DC | PRN
  Administered 2023-05-27: 1000 mg via ORAL
  Filled 2023-05-26: qty 2

## 2023-05-26 MED ORDER — DOXAZOSIN MESYLATE 4 MG PO TABS
4.0000 mg | ORAL_TABLET | Freq: Every day | ORAL | Status: DC
  Administered 2023-05-27: 4 mg via ORAL
  Filled 2023-05-26: qty 1

## 2023-05-26 MED ORDER — ZOLPIDEM TARTRATE 5 MG PO TABS
5.0000 mg | ORAL_TABLET | Freq: Every evening | ORAL | Status: DC | PRN
  Administered 2023-05-26: 5 mg via ORAL
  Filled 2023-05-26: qty 1

## 2023-05-26 MED ORDER — AMLODIPINE BESYLATE 5 MG PO TABS: 5.0000 mg | ORAL_TABLET | Freq: Every day | ORAL | 0 refills | Status: DC

## 2023-05-26 MED ORDER — FLECAINIDE ACETATE 100 MG PO TABS
100.0000 mg | ORAL_TABLET | Freq: Once | ORAL | Status: AC
  Administered 2023-05-26: 100 mg via ORAL
  Filled 2023-05-26: qty 1

## 2023-05-26 MED ORDER — ACETAMINOPHEN 500 MG PO TABS
1000.0000 mg | ORAL_TABLET | Freq: Once | ORAL | Status: AC
  Administered 2023-05-26: 1000 mg via ORAL
  Filled 2023-05-26: qty 2

## 2023-05-26 MED ORDER — GABAPENTIN 300 MG PO CAPS
600.0000 mg | ORAL_CAPSULE | Freq: Every day | ORAL | Status: DC
  Administered 2023-05-26: 600 mg via ORAL
  Filled 2023-05-26: qty 2

## 2023-05-26 MED ORDER — MONTELUKAST SODIUM 10 MG PO TABS
10.0000 mg | ORAL_TABLET | Freq: Every morning | ORAL | Status: DC
  Administered 2023-05-27: 10 mg via ORAL
  Filled 2023-05-26: qty 1

## 2023-05-26 MED ORDER — AMOXICILLIN-POT CLAVULANATE 875-125 MG PO TABS: 1.0000 | ORAL_TABLET | Freq: Two times a day (BID) | ORAL | 0 refills | Status: DC

## 2023-05-26 MED ORDER — MAGNESIUM OXIDE -MG SUPPLEMENT 400 (240 MG) MG PO TABS
400.0000 mg | ORAL_TABLET | Freq: Every morning | ORAL | Status: DC
  Administered 2023-05-27: 400 mg via ORAL
  Filled 2023-05-26: qty 1

## 2023-05-26 MED ORDER — AMOXICILLIN-POT CLAVULANATE 875-125 MG PO TABS
1.0000 | ORAL_TABLET | Freq: Once | ORAL | Status: AC
  Administered 2023-05-26: 1 via ORAL
  Filled 2023-05-26: qty 1

## 2023-05-26 MED ORDER — FAMOTIDINE 20 MG PO TABS
40.0000 mg | ORAL_TABLET | Freq: Two times a day (BID) | ORAL | Status: DC
  Administered 2023-05-26 – 2023-05-27 (×2): 40 mg via ORAL
  Filled 2023-05-26 (×2): qty 2

## 2023-05-26 MED ORDER — LORATADINE 10 MG PO TABS
10.0000 mg | ORAL_TABLET | Freq: Every morning | ORAL | Status: DC
  Administered 2023-05-27: 10 mg via ORAL
  Filled 2023-05-26: qty 1

## 2023-05-26 MED ORDER — FUROSEMIDE 40 MG PO TABS
40.0000 mg | ORAL_TABLET | Freq: Every day | ORAL | Status: DC
  Administered 2023-05-27: 40 mg via ORAL
  Filled 2023-05-26: qty 1

## 2023-05-26 MED ORDER — ALBUTEROL SULFATE (2.5 MG/3ML) 0.083% IN NEBU: 2.5000 mg | INHALATION_SOLUTION | Freq: Four times a day (QID) | RESPIRATORY_TRACT | Status: DC | PRN

## 2023-05-26 MED ORDER — POTASSIUM CHLORIDE CRYS ER 20 MEQ PO TBCR
40.0000 meq | EXTENDED_RELEASE_TABLET | Freq: Once | ORAL | Status: AC
  Administered 2023-05-26: 40 meq via ORAL
  Filled 2023-05-26: qty 2

## 2023-05-26 MED ORDER — HYDRALAZINE HCL 20 MG/ML IJ SOLN: 10.0000 mg | Freq: Four times a day (QID) | INTRAMUSCULAR | Status: DC | PRN

## 2023-05-26 MED ORDER — APIXABAN 2.5 MG PO TABS
2.5000 mg | ORAL_TABLET | Freq: Two times a day (BID) | ORAL | Status: DC
  Administered 2023-05-26 – 2023-05-27 (×2): 2.5 mg via ORAL
  Filled 2023-05-26 (×2): qty 1

## 2023-05-26 MED ORDER — MECLIZINE HCL 25 MG PO TABS
25.0000 mg | ORAL_TABLET | Freq: Three times a day (TID) | ORAL | Status: DC | PRN
  Administered 2023-05-26: 25 mg via ORAL
  Filled 2023-05-26: qty 1

## 2023-05-26 MED ORDER — FERROUS SULFATE 325 (65 FE) MG PO TABS
325.0000 mg | ORAL_TABLET | Freq: Every day | ORAL | Status: DC
  Administered 2023-05-26: 325 mg via ORAL
  Filled 2023-05-26: qty 1

## 2023-05-26 MED ORDER — SERTRALINE HCL 25 MG PO TABS
25.0000 mg | ORAL_TABLET | Freq: Every day | ORAL | Status: DC
  Administered 2023-05-26: 25 mg via ORAL
  Filled 2023-05-26: qty 1

## 2023-05-26 MED ORDER — FLECAINIDE ACETATE 100 MG PO TABS
100.0000 mg | ORAL_TABLET | Freq: Two times a day (BID) | ORAL | Status: DC
  Administered 2023-05-27: 100 mg via ORAL
  Filled 2023-05-26 (×3): qty 1

## 2023-05-26 MED ORDER — ALBUTEROL SULFATE HFA 108 (90 BASE) MCG/ACT IN AERS: 2.0000 | INHALATION_SPRAY | Freq: Four times a day (QID) | RESPIRATORY_TRACT | Status: DC | PRN

## 2023-05-26 MED ORDER — AMLODIPINE BESYLATE 10 MG PO TABS
10.0000 mg | ORAL_TABLET | Freq: Every day | ORAL | Status: DC
  Administered 2023-05-27: 10 mg via ORAL
  Filled 2023-05-26: qty 1

## 2023-05-26 MED ORDER — AMOXICILLIN-POT CLAVULANATE 875-125 MG PO TABS
1.0000 | ORAL_TABLET | Freq: Two times a day (BID) | ORAL | Status: DC
  Administered 2023-05-27: 1 via ORAL
  Filled 2023-05-26: qty 1

## 2023-05-26 MED ORDER — POTASSIUM CHLORIDE CRYS ER 20 MEQ PO TBCR: 20.0000 meq | EXTENDED_RELEASE_TABLET | Freq: Every day | ORAL | Status: DC

## 2023-05-26 MED ORDER — POTASSIUM CHLORIDE 10 MEQ/100ML IV SOLN
10.0000 meq | INTRAVENOUS | Status: AC
  Administered 2023-05-26 – 2023-05-27 (×2): 10 meq via INTRAVENOUS
  Filled 2023-05-26: qty 100

## 2023-05-26 NOTE — Assessment & Plan Note (Signed)
-  continue nightly CPAP -pt is currently trying to follow back up with pulmonology for an update on his CPAP machine

## 2023-05-26 NOTE — Assessment & Plan Note (Signed)
-  administer oral and IV potassium supplement

## 2023-05-26 NOTE — ED Notes (Signed)
Pt ambulatory to bathroom with walker.

## 2023-05-26 NOTE — ED Triage Notes (Signed)
Pt reports x3 weeks of intermittent BP readings, primarily elevated, with it being more consistent the last week. Denies hx of HTN or meds. On flecanide.  Pt also endorses similar time frame confusion and near syncope.

## 2023-05-26 NOTE — Assessment & Plan Note (Addendum)
Peripheral vertigo Vestibular neuritis  -3 weeks symptoms of vertigo, pre-syncope and balance issues.No focal neurological deficit on exam. No cardiac symptoms. Interestingly symptoms not reproducible on exam with maneuvers.  -Neurology on-call was curbsided and reviewed MRI imaging. Effusion was seen around right mastoid and perhaps some of the left. Symptoms almost certainly is peripheral.  -will start a burst of prednisone with taper over the next week -continue Augmentin for tx of sinusitis  -meclizine PRN  -vestibular PT consulted -will also keep on continuous telemetry given his hx of a.fib although doubt cardiac involvement

## 2023-05-26 NOTE — ED Notes (Signed)
Pt to MRI

## 2023-05-26 NOTE — ED Notes (Signed)
Lab to add on troponin to blood already in lab

## 2023-05-26 NOTE — ED Notes (Signed)
Pt daughter states she is a Engineer, civil (consulting) and married to a cardiovascular doctor and that this patient isnt going to be discharged with a blood pressure of 197/81. Informed her that he had received medication for the pressure. Pt states her husband the cardiovascular doctor has looked over the patients workup and says that he shouldn't go home. Pt and daughter informed that I would go and let the provider know and she would be in to speak with them.

## 2023-05-26 NOTE — Assessment & Plan Note (Addendum)
-  hx of successful DCCV later with return of a.fib. Converted on Flecainide 50 mg bid but then had recurrence again. Converted on 100 mg bid and has maintained NSR as long as he is taking it.  Metoprolol previously discontinued due to bradycardia.  -continue Flecainide and Eliquis

## 2023-05-26 NOTE — Assessment & Plan Note (Addendum)
-  BP has been labile for the past few weeks associated with his symptoms of headache and vertiginous symptoms -he actually had adjustments to his BP medication a few months ago and taken off metoprolo due to low blood pressure and bradycardia.  -BP may be just exacerbated by his symptoms. Will continue home antihypertensives along with IV PRN hydralazine with perimeters and monitor. If he is needing repeated PRN doses, then will start additional antihypertensive. If BP remains normal, will have him record log outpatient and follow up with his primary.  -continue amlodipine, Lasix and doxazosin

## 2023-05-26 NOTE — H&P (Addendum)
History and Physical    Patient: [name]:[id number] DOB: 11-22-1934 DOA: 05/26/2023 DOS: the patient was seen and examined on 05/26/2023 PCP: Chilton Greathouse, MD  Patient coming from: Home  Chief Complaint:  Chief Complaint  Patient presents with   Hypertension   HPI: [name] is a 88 y.o. male with medical history significant of parosymal A.fib s/p DCCV on Flecanide and Eliquis, HTN, OSA on CPAP, HFpEF, C.diff, mitral regurgitation who presents with dizziness.   Daughter at bedside assist with gaps in history as pt has some difficulty recalling.  About 3 weeks ago he took his wife to an appointment, went out the parking lot and could not find his car. Had to honk the horn and then realized the car was just right in front of him. He then drove up to pick up his wife but saw a lot of floaters and almost couldn't see. Felt dizzy with spinning sensation. Had to get his wife to drive afterwards. For the past few weeks continued to have dizziness symptoms when he gets up. Has to hold on to the walls to steady himself. Has trouble seeing and feeling like he might pass out. Has associated generalized weakness. Also has frontal headaches. No nausea or vomiting. No loss of bowel or bladder function. Today he was just sitting and playing solitary on his phone and just seeing the cards move across the screen provoked his symptoms of again seeing floaters and dizziness.  Denies any chest pain or palpitations. No shortness of breath. No cough, congestion or runny nose. No sinus pressure. Although reports being treated for a cold a few weeks ago. Denies any ear fullness or tinnitus.   Throughout these episodes he also noticed labile BP up to SBP of 180. He had some adjustment of his medications several months ago including Flecanide increase, decrease of his Lasix and discontinuation of his amlodipine and metoprolol.    In the ED,  he was afebrile, HR 50-70, BP of 197/70s on room air.  No  leukocytosis. Hgb of 12.5  Has mild hypokalemia of 3.2. Creatinine of 1.30 from prior of 1.94. Troponin of 6 and 5.   Negative COVID/FLU/RSV.  CT head was negative.   MRI brain with only signs of possible acute pansinusitis. He was started on Augmentin and resumed on amlodipine. Hospitalist consulted for vertiginous symptoms and elevated BP.    Review of Systems: As mentioned in the history of present illness. All other systems reviewed and are negative. Past Medical History:  Diagnosis Date   Anemia    Arthritis    osteoarthritis  back   Celiac disease    Chronic kidney disease    Collagenous colitis    Diverticulosis    Enlarged prostate    followed by dr  [name]   Esophageal dysmotility    Esophageal dysmotility    Esophageal stenosis    Essential hypertension, benign 03/29/2014   External hemorrhoids    External hemorrhoids    Full dentures    GERD (gastroesophageal reflux disease)    Hemorrhoids    Hiatal hernia    IBS (irritable bowel syndrome)    Internal hemorrhoids    OSA on CPAP 10/17/2018   Osteoporosis    Postoperative anemia due to acute blood loss 03/29/2014   Schatzki's ring    Sleep apnea    CPAP Machine   Ulcer    Past Surgical History:  Procedure Laterality Date   APPENDECTOMY  1963   CARDIOVERSION N/A  12/16/2022   Procedure: CARDIOVERSION;  Surgeon: Lewayne Bunting, MD;  Location: [facility] ENDOSCOPY;  Service: Cardiovascular;  Laterality: N/A;   ESOPHAGOGASTRODUODENOSCOPY  01/09/2022   EYE SURGERY Bilateral    cataract removal - implants   HERNIA REPAIR  2002   right inguinal    HIP SURGERY Left    INGUINAL HERNIA REPAIR  08/19/2011   Procedure: HERNIA REPAIR  left INGUINAL ADULT;  Surgeon: Adolph Pollack, MD;  Location: [facility] ORS;  Service: General;  Laterality: Left;   INTRAMEDULLARY (IM) NAIL INTERTROCHANTERIC Left 03/27/2014   Procedure: INTRAMEDULLARY (IM) NAIL INTERTROCHANTRIC;  Surgeon: Shelda Pal, MD;  Location: [facility] ORS;  Service:  Orthopedics;  Laterality: Left;   RIGHT/LEFT HEART CATH AND CORONARY ANGIOGRAPHY N/A 03/12/2017   Procedure: Right/Left Heart Cath and Coronary Angiography;  Surgeon: Dolores Patty, MD;  Location: [facility] INVASIVE CV LAB;  Service: Cardiovascular;  Laterality: N/A;   TEE WITHOUT CARDIOVERSION N/A 12/16/2022   Procedure: TRANSESOPHAGEAL ECHOCARDIOGRAM (TEE);  Surgeon: Lewayne Bunting, MD;  Location: [facility] ENDOSCOPY;  Service: Cardiovascular;  Laterality: N/A;   UPPER GASTROINTESTINAL ENDOSCOPY  2008   Social History:  reports that he quit smoking about 41 years ago. His smoking use included cigarettes. He has never used smokeless tobacco. He reports current alcohol use. He reports that he does not use drugs.  Allergies  Allergen Reactions   Wheat Other (See Comments)    Celiac Disease    Family History  Problem Relation Age of Onset   Cervical cancer Mother    Colon cancer Mother    Stroke Father    Heart attack Father    Breast cancer Sister    Throat cancer Brother    Liver cancer Brother    Brain cancer Brother    Esophageal cancer Neg Hx    Rectal cancer Neg Hx    Stomach cancer Neg Hx     Prior to Admission medications   Medication Sig Start Date End Date Taking? Authorizing Provider  acetaminophen (TYLENOL) 500 MG tablet Take 1,000 mg by mouth as needed for mild pain or moderate pain.   Yes [provider]  albuterol (PROVENTIL HFA;VENTOLIN HFA) 108 (90 Base) MCG/ACT inhaler Inhale 2 puffs into the lungs every 6 (six) hours as needed for wheezing or shortness of breath.   Yes [provider]  alendronate (FOSAMAX) 70 MG tablet Take 70 mg by mouth every Monday. 06/26/19  Yes [provider]  amLODipine (NORVASC) 10 MG tablet Take 10 mg by mouth daily. 02/14/23  Yes [provider]  amLODipine (NORVASC) 5 MG tablet Take 1 tablet (5 mg total) by mouth daily. 05/26/23  Yes Arby Barrette, MD  amoxicillin-clavulanate (AUGMENTIN) 875-125 MG tablet  Take 1 tablet by mouth every 12 (twelve) hours. 05/26/23  Yes Arby Barrette, MD  apixaban (ELIQUIS) 5 MG TABS tablet Take 2.5 mg by mouth 2 (two) times daily.   Yes [provider]  diclofenac Sodium (VOLTAREN) 1 % GEL Apply 2 g topically 4 (four) times daily as needed (pain.).   Yes [provider]  dicyclomine (BENTYL) 10 MG capsule Take 10 mg by mouth 2 (two) times daily as needed (abdominal pain/spasms.). 01/07/22  Yes [provider]  doxazosin (CARDURA) 8 MG tablet Take 4 mg by mouth daily.    [provider]  famotidine (PEPCID) 40 MG tablet Take 1 tablet (40 mg total) by mouth 2 (two) times daily. 12/19/22   Pyrtle, Carie Caddy, MD  ferrous sulfate 325 (  65 FE) MG tablet Take 325 mg by mouth at bedtime.    [provider]  flecainide (TAMBOCOR) 100 MG tablet Take 1 tablet (100 mg total) by mouth 2 (two) times daily. 01/23/23   [place], Peter M, MD  furosemide (LASIX) 40 MG tablet Take 1 tablet (40 mg total) by mouth daily. 01/16/23   [place], Peter M, MD  gabapentin (NEURONTIN) 600 MG tablet Take 600 mg by mouth at bedtime. 11/28/22   [provider]  HYDROcodone-acetaminophen (NORCO) 5-325 MG per tablet Take 1-2 tablets by mouth every 6 (six) hours as needed. 03/29/14   Lanney Gins, PA-C  loratadine (CLARITIN) 10 MG tablet Take 10 mg by mouth in the morning.    [provider]  magnesium oxide (MAG-OX) 400 (240 Mg) MG tablet Take 400 mg by mouth in the morning.    [provider]  montelukast (SINGULAIR) 10 MG tablet Take 10 mg by mouth in the morning.    [provider]  Polyethyl Glycol-Propyl Glycol (SYSTANE OP) Place 1 drop into both eyes 3 (three) times daily as needed (dry/irritated eyes.).    [provider]  potassium chloride SA (KLOR-CON M) 20 MEQ tablet Take 1 tablet (20 mEq total) by mouth daily with supper. 01/23/23   [place], Peter M, MD  sertraline (ZOLOFT) 50 MG tablet Take 25 mg by mouth at bedtime.     [provider]  tiZANidine (ZANAFLEX) 4 MG capsule Take 4 mg by mouth as needed for muscle spasms.    [provider]  zolpidem (AMBIEN) 10 MG tablet Take 5 mg by mouth at bedtime as needed for sleep.    [provider]    Physical Exam: Vitals:   05/26/23 1945 05/26/23 2015 05/26/23 2115 05/26/23 2302  BP: (!) 187/76 (!) 197/76 (!) 182/86   Pulse: 92 64 71   Resp:      Temp:    98.5 F (36.9 C)  TempSrc:    Oral  SpO2: 95% 94% 94%   Weight:      Height:       Constitutional: NAD, calm, comfortable, well appearing elderly male sitting upright in bed Eyes: lids and conjunctivae normal ENMT: Mucous membranes are moist. Neck: normal, supple Respiratory: clear to auscultation bilaterally, no wheezing, no crackles. Normal respiratory effort. No accessory muscle use.  Cardiovascular: Regular rate and rhythm, no murmurs / rubs / gallops. No extremity edema.  Abdomen: soft, non-tender Musculoskeletal: no clubbing / cyanosis. No joint deformity upper and lower extremities.  Normal muscle tone.  Skin: no rashes, lesions, ulcers.  Neurologic: CN 2-12 grossly intact. Sensation intact,  Strength 5/5 in all 4. Intact finger to nose. Intact heel to shin. Dix-Hallpike maneuver without any nystagmus or reproducible symptoms.    Psychiatric: Normal judgment and insight. Alert and oriented x 3. Normal mood.   Data Reviewed:  See HPI  Assessment and Plan: * Dizziness Peripheral vertigo Vestibular neuritis  -3 weeks symptoms of vertigo, pre-syncope and balance issues.No focal neurological deficit on exam. No cardiac symptoms. Interestingly symptoms not reproducible on exam with maneuvers.  -Neurology on-call was curbsided and reviewed MRI imaging. Effusion was seen around right mastoid and perhaps some of the left. Symptoms almost certainly is peripheral.  -will start a burst of prednisone with taper over the next week -continue Augmentin for tx of sinusitis   -meclizine PRN  -vestibular PT consulted -will also keep on continuous telemetry given his hx of a.fib although doubt cardiac involvement  Hypokalemia -  administer oral and IV potassium supplement  PAF (paroxysmal atrial fibrillation) (HCC) -hx of successful DCCV later with return of a.fib. Converted on Flecainide 50 mg bid but then had recurrence again. Converted on 100 mg bid and has maintained NSR as long as he is taking it.  Metoprolol previously discontinued due to bradycardia.  -continue Flecainide and Eliquis  OSA on CPAP -continue nightly CPAP -pt is currently trying to follow back up with pulmonology for an update on his CPAP machine  Essential hypertension, benign -BP has been labile for the past few weeks associated with his symptoms of headache and vertiginous symptoms -he actually had adjustments to his BP medication a few months ago and taken off metoprolo due to low blood pressure and bradycardia.  -BP may be just exacerbated by his symptoms. Will continue home antihypertensives along with IV PRN hydralazine with perimeters and monitor. If he is needing repeated PRN doses, then will start additional antihypertensive. If BP remains normal, will have him record log outpatient and follow up with his primary.  -continue amlodipine, Lasix and doxazosin      Advance Care Planning: Full   Consults: curbsided neurology  Family Communication: daughter and a friend at bedside  Severity of Illness: The appropriate patient status for this patient is OBSERVATION. Observation status is judged to be reasonable and necessary in order to provide the required intensity of service to ensure the patient's safety. The patient's presenting symptoms, physical exam findings, and initial radiographic and laboratory data in the context of their medical condition is felt to place them at decreased risk for further clinical deterioration. Furthermore, it is anticipated that the patient will be  medically stable for discharge from the hospital within 2 midnights of admission.   Author: Anselm Jungling, DO 05/26/2023 11:41 PM  For on call review [email or web address].

## 2023-05-26 NOTE — ED Provider Notes (Signed)
[place] [facility] AT [facility] Provider Note   CSN: [id number] Arrival date & time: 05/26/23  1436     History  Chief Complaint  Patient presents with   Hypertension    [name] is a 88 y.o. male.  HPI history of HTN and OSA on CPAP. Prior Myoview in 2010 was normal. He had a cardiac cath in 2018 showing normal coronaries and normal right heart hemodynamics. Echo in 2018 showed mild MR-otherwise normal.  Patient is also variable symptoms that started 2 weeks ago.  2 weeks ago he had a sudden episode of getting dizzy and then having a brief period of memory loss whereby he could not remember where his car was.  He was at a doctor's appointment with his wife.  He was able to locate the car by honking the horn.  He was then driving out with her but then suddenly felt like there were a lot of black spots in his vision and that he might pass out or he was not well.  She ended up driving home.  He did not have loss of consciousness.  Symptoms seem to abate.  He was then asymptomatic.  He reports however over the past week now he has been having some headaches which is not typical for him.  He is not felt completely well for a few days.  He has been experiencing some dizziness which sounds vertiginous.  Today he felt really well and there were no symptoms but then at 1 PM he very suddenly had a strange sensation like "nothing was lining up".  He thought he might die.  He cannot remember if he had any chest pain palpitation or shortness of breath.  He reports after that he was very unsteady and off balance.  Those symptoms lasted for about an hour.  Family member came to check in at about 2 PM and he still seemed unusually unsteady.  At baseline patient has very clear mental status and normal gait.  At this time symptoms seem gone although he does endorse still feeling a little bit "weird".  He reports lately blood pressure readings have been high intermittently.  He  reports earlier when all this was happening blood pressures were up at almost 180 systolic.  There has been some waxing and waning of blood pressures more around 160 systolic and then back to normotensive.      Home Medications Prior to Admission medications   Medication Sig Start Date End Date Taking? Authorizing Provider  amLODipine (NORVASC) 5 MG tablet Take 1 tablet (5 mg total) by mouth daily. 05/26/23  Yes Arby Barrette, MD  amoxicillin-clavulanate (AUGMENTIN) 875-125 MG tablet Take 1 tablet by mouth every 12 (twelve) hours. 05/26/23  Yes Arby Barrette, MD  acetaminophen (TYLENOL) 650 MG CR tablet Take 650 mg by mouth every evening.    [provider]  albuterol (PROVENTIL HFA;VENTOLIN HFA) 108 (90 Base) MCG/ACT inhaler Inhale 2 puffs into the lungs every 6 (six) hours as needed for wheezing or shortness of breath.    [provider]  alendronate (FOSAMAX) 70 MG tablet Take 70 mg by mouth every Monday. 06/26/19   [provider]  amLODipine (NORVASC) 10 MG tablet Take 10 mg by mouth daily. 02/14/23   [provider]  apixaban (ELIQUIS) 5 MG TABS tablet Take 2.5 mg by mouth 2 (two) times daily.    [provider]  diclofenac Sodium (VOLTAREN) 1 % GEL Apply 2 g topically 4 (  four) times daily as needed (pain.).    [provider]  dicyclomine (BENTYL) 10 MG capsule Take 10 mg by mouth 2 (two) times daily as needed (abdominal pain/spasms.). 01/07/22   [provider]  DOCUSATE SODIUM PO Take 100 mg by mouth daily as needed.    [provider]  doxazosin (CARDURA) 8 MG tablet Take 4 mg by mouth daily.    [provider]  famotidine (PEPCID) 40 MG tablet Take 1 tablet (40 mg total) by mouth 2 (two) times daily. 12/19/22   Pyrtle, Carie Caddy, MD  ferrous sulfate 325 (65 FE) MG tablet Take 325 mg by mouth at bedtime.    [provider]  flecainide (TAMBOCOR) 100 MG tablet Take 1 tablet (100 mg total) by mouth 2 (two)  times daily. 01/23/23   [place], Peter M, MD  furosemide (LASIX) 40 MG tablet Take 1 tablet (40 mg total) by mouth daily. 01/16/23   [place], Peter M, MD  gabapentin (NEURONTIN) 600 MG tablet Take 600 mg by mouth at bedtime. 11/28/22   [provider]  HYDROcodone-acetaminophen (NORCO) 5-325 MG per tablet Take 1-2 tablets by mouth every 6 (six) hours as needed. 03/29/14   Lanney Gins, PA-C  loratadine (CLARITIN) 10 MG tablet Take 10 mg by mouth in the morning.    [provider]  magnesium oxide (MAG-OX) 400 (240 Mg) MG tablet Take 400 mg by mouth in the morning.    [provider]  montelukast (SINGULAIR) 10 MG tablet Take 10 mg by mouth in the morning.    [provider]  Polyethyl Glycol-Propyl Glycol (SYSTANE OP) Place 1 drop into both eyes 3 (three) times daily as needed (dry/irritated eyes.).    [provider]  potassium chloride SA (KLOR-CON M) 20 MEQ tablet Take 1 tablet (20 mEq total) by mouth daily with supper. 01/23/23   [place], Peter M, MD  sertraline (ZOLOFT) 50 MG tablet Take 25 mg by mouth at bedtime.    [provider]  tiZANidine (ZANAFLEX) 4 MG capsule Take 4 mg by mouth as needed for muscle spasms.    [provider]  zolpidem (AMBIEN) 10 MG tablet Take 5 mg by mouth at bedtime as needed for sleep.    [provider]      Allergies    Wheat    Review of Systems   Review of Systems  Physical Exam Updated Vital Signs BP (!) 187/76   Pulse 92   Temp 98.2 F (36.8 C) (Oral)   Resp 17   Ht 5\' 6"  (1.676 m)   Wt 63.5 kg   SpO2 95%   BMI 22.60 kg/m  Physical Exam Constitutional:      Comments: Alert nontoxic well-nourished well-developed.  HENT:     Head: Normocephalic and atraumatic.     Mouth/Throat:     Mouth: Mucous membranes are moist.     Pharynx: Oropharynx is clear.  Eyes:     Extraocular Movements: Extraocular movements intact.     Conjunctiva/sclera: Conjunctivae normal.     Pupils:  Pupils are equal, round, and reactive to light.  Cardiovascular:     Rate and Rhythm: Normal rate and regular rhythm.  Pulmonary:     Effort: Pulmonary effort is normal.     Breath sounds: Normal breath sounds.  Abdominal:     General: There is no distension.     Palpations: Abdomen is soft.     Tenderness: There is no abdominal tenderness. There is  no guarding.  Musculoskeletal:        General: No swelling or tenderness. Normal range of motion.     Cervical back: Neck supple.     Right lower leg: No edema.     Left lower leg: No edema.  Skin:    General: Skin is warm and dry.  Neurological:     General: No focal deficit present.     Mental Status: He is oriented to person, place, and time.     Cranial Nerves: No cranial nerve deficit.     Sensory: No sensory deficit.     Motor: No weakness.     Coordination: Coordination normal.     Comments: Cognitive function intact and normal.  Speech clear.  No aphasia.  Finger-nose exam normal.  No pronator drift.  Motor strength 5\5 upper and lower extremities.  Sensation intact light touch x 4 extremities.     ED Results / Procedures / Treatments   Labs (all labs ordered are listed, but only abnormal results are displayed) Labs Reviewed  CBC WITH DIFFERENTIAL/PLATELET - Abnormal; Notable for the following components:      Result Value   RBC 4.07 (*)    Hemoglobin 12.5 (*)    All other components within normal limits  COMPREHENSIVE METABOLIC PANEL - Abnormal; Notable for the following components:   Potassium 3.2 (*)    Glucose, Bld 175 (*)    Creatinine, Ser 1.30 (*)    Calcium 8.6 (*)    GFR, Estimated 54 (*)    All other components within normal limits  RESP PANEL BY RT-PCR (RSV, FLU A&B, COVID)  [id number]  CBC WITH DIFFERENTIAL/PLATELET  URINALYSIS, ROUTINE W REFLEX MICROSCOPIC  TROPONIN I (HIGH SENSITIVITY)  TROPONIN I (HIGH SENSITIVITY)    EKG EKG Interpretation Date/Time:  Tuesday May 26 2023 15:08:24 EDT Ventricular  Rate:  57 PR Interval:  220 QRS Duration:  113 QT Interval:  477 QTC Calculation: 465 R Axis:   22  Text Interpretation: Sinus rhythm Prolonged PR interval Incomplete left bundle branch block rate slower. Confirmed by Arby Barrette [phone]) on 05/26/2023 5:13:39 PM  Radiology MR Brain Wo Contrast (neuro protocol)  Result Date: 05/26/2023 CLINICAL DATA:  Stroke suspected. EXAM: MRI HEAD WITHOUT CONTRAST TECHNIQUE: Multiplanar, multiecho pulse sequences of the brain and surrounding structures were obtained without intravenous contrast. COMPARISON:  Same-day CT head FINDINGS: Brain: There is no acute intracranial hemorrhage, extra-axial fluid collection, or acute infarct Parenchymal volume is normal. The ventricles are normal in size. There is mild background chronic small-vessel ischemic change. The pituitary and suprasellar region are normal. There is no mass lesion. There is no mass effect or midline shift. Vascular: Normal flow voids. Skull and upper cervical spine: Normal marrow signal. Sinuses/Orbits: There is pansinusitis with layering fluid in the left sphenoid sinus. Bilateral lens implants are in place. The globes and orbits are otherwise unremarkable. Other: There is a small right mastoid effusion. The imaged nasopharynx is unremarkable. IMPRESSION: 1. No acute intracranial pathology. 2. Pansinusitis with layering fluid which could reflect acute sinusitis in the correct clinical setting. 3. Trace right mastoid effusion. Electronically Signed   By: Lesia Hausen M.D.   On: 05/26/2023 18:25   CT Head Wo Contrast  Result Date: 05/26/2023 CLINICAL DATA:  TIA EXAM: CT HEAD WITHOUT CONTRAST TECHNIQUE: Contiguous axial images were obtained from the base of the skull through the vertex without intravenous contrast. RADIATION DOSE REDUCTION: This exam was performed according to the departmental dose-optimization program which  includes automated exposure control, adjustment of the mA and/or kV  according to patient size and/or use of iterative reconstruction technique. COMPARISON:  None Available. FINDINGS: Brain: No acute intracranial findings are seen in noncontrast CT brain. There are no signs of bleeding within the cranium. Cortical sulci are prominent. Vascular: Scattered arterial calcifications are seen. Skull: No acute findings are seen in the calvarium. Sinuses/Orbits: Mucosal thickening is seen in frontal, ethmoid, sphenoid and maxillary sinuses. Other: None. IMPRESSION: No acute intracranial findings are seen.  Atrophy. Chronic pansinusitis. Electronically Signed   By: Ernie Avena M.D.   On: 05/26/2023 16:53    Procedures Procedures   CRITICAL CARE Performed by: Arby Barrette   Total critical care time: 30 minutes  Critical care time was exclusive of separately billable procedures and treating other patients.  Critical care was necessary to treat or prevent imminent or life-threatening deterioration.  Critical care was time spent personally by me on the following activities: development of treatment plan with patient and/or surrogate as well as nursing, discussions with consultants, evaluation of patient's response to treatment, examination of patient, obtaining history from patient or surrogate, ordering and performing treatments and interventions, ordering and review of laboratory studies, ordering and review of radiographic studies, pulse oximetry and re-evaluation of patient's condition.  Medications Ordered in ED Medications  amLODipine (NORVASC) tablet 10 mg (10 mg Oral Given 05/26/23 1943)  amoxicillin-clavulanate (AUGMENTIN) 875-125 MG per tablet 1 tablet (1 tablet Oral Given 05/26/23 1943)    ED Course/ Medical Decision Making/ A&P                                 Medical Decision Making Amount and/or Complexity of Data Reviewed Labs: ordered. Radiology: ordered.  Risk Prescription drug management.  Patient presents as outlined with some stuttering  symptoms of vertigo like symptoms and variable amount of headache over the past week.  He describes an acute event where he was significantly more dizzy and had a feeling of disjointed type of disorientation.  This was self-limited.  At this time we will proceed with stroke workup including CT head and labs.  Patient is neurologic exam is currently normal.  He does not have any stroke symptoms and at this time will not initiate code stroke.  CT head no acute intracranial bleed.  Will proceed with MRI.  COVID and influenza testing negative.  Basic chemistries normal except potassium of 3.2 glucose 175 BUN 19 creatinine 1.3 with GFR 54.  Normal LFTs.  White count 8.0 hemoglobin 12.5 normal differential.  EKG interpreted by myself no acute ischemic appearance.  Consistent with older tracings.  Troponin 5.  MRI shows pansinusitis with fluid layering in the left sphenoid, possibly acute and trace fluid in the mastoid.  However, no acute infarct or other acute intracranial abnormality.  At this time with patient's description of headache and some bandlike headache as well as vertiginous dizziness I suspect sinusitis as etiology.  Will opt to treat in the setting with Augmentin.  Patient's blood pressures were moderately elevated.  Initial and take pressure was 141/103 subsequent pressures in 160s over 60s.  Maximum blood pressure at this time 173/66.  Patient reports previously his blood pressures were normotensive but he has been off of his blood pressure medication due to atrial fibrillation and apparently getting pressures that were too low.  I did not acutely lower blood pressures patient did not show signs of hypertensive encephalopathy.  At this time however with patient ruled out for CVA will administer prior home dose of amlodipine 10 mg p.o. and have patient restart amlodipine.  He does have close follow-up with cardiology and PCP.  They can continue to monitor this closely and decrease or discontinue  again if blood pressures are normalizing or too low.        Final Clinical Impression(s) / ED Diagnoses Final diagnoses:  Vertigo  Acute pansinusitis, recurrence not specified  Episode of confusion  Hypertensive urgency    Rx / DC Orders ED Discharge Orders          Ordered    amoxicillin-clavulanate (AUGMENTIN) 875-125 MG tablet  Every 12 hours        05/26/23 2001    amLODipine (NORVASC) 5 MG tablet  Daily        05/26/23 Trevor Iha, MD 05/26/23 2013

## 2023-05-27 ENCOUNTER — Encounter (HOSPITAL_COMMUNITY): Payer: Self-pay | Admitting: Family Medicine

## 2023-05-27 ENCOUNTER — Encounter: Payer: Self-pay | Admitting: Cardiology

## 2023-05-27 DIAGNOSIS — R42 Dizziness and giddiness: Secondary | ICD-10-CM | POA: Diagnosis not present

## 2023-05-27 LAB — URINALYSIS, ROUTINE W REFLEX MICROSCOPIC
Bilirubin Urine: NEGATIVE
Glucose, UA: NEGATIVE mg/dL
Hgb urine dipstick: NEGATIVE
Ketones, ur: NEGATIVE mg/dL
Leukocytes,Ua: NEGATIVE
Nitrite: NEGATIVE
Protein, ur: NEGATIVE mg/dL
Specific Gravity, Urine: 1.01 (ref 1.005–1.030)
pH: 7 (ref 5.0–8.0)

## 2023-05-27 MED ORDER — PREDNISONE 10 MG PO TABS: ORAL_TABLET | ORAL | 0 refills | Status: DC

## 2023-05-27 MED ORDER — FUROSEMIDE 20 MG PO TABS: 20.0000 mg | ORAL_TABLET | Freq: Every day | ORAL | Status: DC

## 2023-05-27 MED ORDER — AMOXICILLIN-POT CLAVULANATE 875-125 MG PO TABS: 1.0000 | ORAL_TABLET | Freq: Two times a day (BID) | ORAL | 0 refills | Status: AC

## 2023-05-27 NOTE — Discharge Summary (Signed)
Physician Discharge Summary  [name] WJX:[id number] DOB: [birth year] DOA: 05/26/2023  PCP: Chilton Greathouse, MD  Admit date: 05/26/2023 Discharge date: 05/27/2023  Time spent: 27 minutes  Recommendations for Outpatient Follow-up:  Request vestibular rehab in the outpatient setting to continue and have ordered the same with home health PT Suggest completion of prednisone taper for possible vestibular neuritis and completion of Augmentin short tapered course No doses changes/adjustment of blood pressure meds and Lasix-May need to uptitrate Lasix to home dose in 1 to 3 days-CC Dr. Swazil[place] his cardiologist Dr. 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 Mayford Knife [facility] Cardiac cath 2018 normal coronaries- Permanent A-fib + DCCV 12/16/2022--did not sustain sinus-loaded with flecainide and converted-last TEE 55-60%-not a candidate for beta-blocker secondary to bradycardia-is on Eliquis   Presented 8/13---3 weeks intermittent ataxia-he was taking his wife to [facility] for her appointment 3 weeks prior to admission, lost his way felt like he was "blacking out" felt like "world was coming to an end" and could not find his car-eventually he was able to make it home Family relates that he has had increasing but mild confusion over the past 8 months  Subsequently about 1 week prior to admission he started having symptoms again of buckling of the right knee  .blacking out spells occasional  He was playing solitaire on his iPad on 8/13 and felt several times that either the screen was moving or that his eyes were shutting he did not feel prototypical room spinning  -patient's family called cardiology office and reported normal vitals including normal heart rate-was taking Lasix 40 and flecainide 100 twice daily--- patient was brought to the emergency room and blood pressure was quite elevated and as patient was unsteady on his feet patient was observed overnight   CT head showed no brain bleed MRI brain showed pansinusitis possible trace fluid in mastoid-neurology over read the studies as a curbside and it was felt ultimately that patient had a vestibular neuronitis/myelitis Patient had provocative testing by vestibular rehab with PT and felt 8 much in terms of functionality on 8/14 Meclizine was discontinued I tapered the steroids in a stepwise graded fashion as per up-to-date guidelines patient was given 2 more days of Augmentin (most of the viruses that cause pneumonitis or viral) Patient was ultimately felt to be quite stable for discharge and will be ambulating with physical therapy and we will get home health PT and outpatient vestibular rehab at home I discussed all this with the patient's daughter on the phone and she understood the plan of care     Discharge Exam: Vitals:   05/27/23 0528 05/27/23 1000  BP: 119/64 (!) 152/79  Pulse: 63 87  Resp: 16   Temp: 97.8 F (36.6 C) 97.7 F (36.5 C)  SpO2: 91%     Subj on day of d/c   Awake coherent no distress seems comfortable  General Exam on discharge  EOMI NCAT no focal deficit no icterus no pallor chest is clear no added  sound no rales rhonchi S1-S2 no murmur no rub no gallop finger-nose-finger intact power 5/5 upper lower extremity strength reflexes 2/3 sensory intact Abdomen soft no rebound no guarding No dysdiadochokinesia Knee heel test intact bilaterally sensory deferred I did  not test for nystagmus as this had already been done by PT OT  Discharge Instructions   Discharge Instructions     Diet - low sodium heart healthy   Complete by: As directed    Discharge instructions   Complete by: As directed    You were diagnosed with peripheral vertigo Peripheral vertigo is an umbrella term for various things affecting the innate ear that can cause dizziness-because your CT scan on admission was suggestive of a sinus infection this also could have been infectious related and affected the nerve supplying your inner ear Our suggestion would be to use a walker at all times and mobilize with physical therapy at home and I have ordered home health for you The treatment for this inflammation of the possible 8th nerve is steroids and antibiotics-as most of these supposed infections are viral I would only give you 2 days of Augmentin I do feel however that the benefit of taking a steroid such as prednisone for short period of time outweighs the risk and you should continue this I would not start back your amlodipine at this time as your blood pressure varied widely from normal to quite elevated and instead I would just recommend that you continue your Cardura and your Lasix at a slightly lower dose until he can be seen in the outpatient setting by Dr. Swaziland and Dr. Gala Romney who I will CC on this note Please make sure you always have your walker with you as you are on a blood thinner-I do not want you to fall and hit your head  Warning signs to return to the emergency room would be severe blurred vision, falls, bleeding of any kind, slurred speech, chest pain  Good luck and make sure that you keep your follow-up appointments   Increase activity slowly   Complete by: As directed       Allergies as of 05/27/2023       Reactions   Gluten Meal    Celiac Disease   Wheat Other (See Comments)   Celiac Disease        Medication List     STOP taking these medications     alendronate 70 MG tablet Commonly known as: FOSAMAX   amLODipine 10 MG tablet Commonly known as: NORVASC   SYSTANE OP       TAKE these medications    acetaminophen 500 MG tablet Commonly known as: TYLENOL Take 1,000 mg by mouth as needed for mild pain or moderate pain.   albuterol 108 (90 Base) MCG/ACT inhaler Commonly known as: VENTOLIN HFA Inhale 2 puffs into the lungs every 6 (six) hours as needed for wheezing or shortness of breath.   amoxicillin-clavulanate 875-125 MG tablet Commonly known as: AUGMENTIN Take 1 tablet by mouth every 12 (twelve) hours for 2 days.   diclofenac Sodium 1 % Gel Commonly known as: VOLTAREN Apply 2 g topically 4 (four) times daily as needed (pain.).   dicyclomine 10 MG capsule Commonly known as: BENTYL Take 10 mg by mouth 2 (two) times daily as needed (abdominal pain/spasms.).   doxazosin 8 MG tablet Commonly known as: CARDURA Take 4 mg by mouth daily.   Eliquis 5 MG Tabs tablet Generic drug: apixaban Take 2.5 mg by mouth 2 (  two) times daily.   famotidine 40 MG tablet Commonly known as: Pepcid Take 1 tablet (40 mg total) by mouth 2 (two) times daily.   ferrous sulfate 325 (65 FE) MG tablet Take 325 mg by mouth at bedtime.   flecainide 100 MG tablet Commonly known as: TAMBOCOR Take 1 tablet (100 mg total) by mouth 2 (two) times daily.   furosemide 20 MG tablet Commonly known as: LASIX Take 1 tablet (20 mg total) by mouth daily. Start taking on: May 28, 2023 What changed:  medication strength how much to take   gabapentin 600 MG tablet Commonly known as: NEURONTIN Take 600 mg by mouth at bedtime.   loratadine 10 MG tablet Commonly known as: CLARITIN Take 10 mg by mouth in the morning.   magnesium oxide 400 (240 Mg) MG tablet Commonly known as: MAG-OX Take 400 mg by mouth in the morning.   montelukast 10 MG tablet Commonly known as: SINGULAIR Take 10 mg by mouth in the morning.   potassium chloride SA 20  MEQ tablet Commonly known as: KLOR-CON M Take 1 tablet (20 mEq total) by mouth daily with supper.   predniSONE 10 MG tablet Commonly known as: DELTASONE 60 mg daily on days 2 through 5, 40 mg on day 6, 30 mg on day 7, 20 mg on day 8, 10 mg on day 9, and 5 mg on day 10.   sertraline 50 MG tablet Commonly known as: ZOLOFT Take 25 mg by mouth at bedtime.   zolpidem 10 MG tablet Commonly known as: AMBIEN Take 5 mg by mouth at bedtime as needed for sleep.               Durable Medical Equipment  (From admission, onward)           Start     Ordered   05/27/23 1412  DME Walker  Once       Question Answer Comment  Walker: With 5 Inch Wheels   Patient needs a walker to treat with the following condition Unsteady gait      05/27/23 1417           Allergies  Allergen Reactions   Gluten Meal     Celiac Disease   Wheat Other (See Comments)    Celiac Disease    Follow-up Information     Avva, Ravisankar, MD Follow up.   Specialty: Internal Medicine Why: Please contact PCP to arrange outpatient Vestibular Therapy Contact information: [address] [place] [address] [phone]         [facility] Follow up.   Why: Dispensing optician information: [address] [place], [place] [address]   Phone: [phone]                 The results of significant diagnostics from this hospitalization (including imaging, microbiology, ancillary and laboratory) are listed below for reference.    Significant Diagnostic Studies: MR Brain Wo Contrast (neuro protocol)  Result Date: 05/26/2023 CLINICAL DATA:  Stroke suspected. EXAM: MRI HEAD WITHOUT CONTRAST TECHNIQUE: Multiplanar, multiecho pulse sequences of the brain and surrounding structures were obtained without intravenous contrast. COMPARISON:  Same-day CT head FINDINGS: Brain: There is no acute intracranial hemorrhage, extra-axial fluid collection, or acute infarct Parenchymal volume is normal.  The ventricles are normal in size. There is mild background chronic small-vessel ischemic change. The pituitary and suprasellar region are normal. There is no mass lesion. There is no mass effect or midline shift. Vascular: Normal flow  voids. Skull and upper cervical spine: Normal marrow signal. Sinuses/Orbits: There is pansinusitis with layering fluid in the left sphenoid sinus. Bilateral lens implants are in place. The globes and orbits are otherwise unremarkable. Other: There is a small right mastoid effusion. The imaged nasopharynx is unremarkable. IMPRESSION: 1. No acute intracranial pathology. 2. Pansinusitis with layering fluid which could reflect acute sinusitis in the correct clinical setting. 3. Trace right mastoid effusion. Electronically Signed   By: Lesia Hausen M.D.   On: 05/26/2023 18:25   CT Head Wo Contrast  Result Date: 05/26/2023 CLINICAL DATA:  TIA EXAM: CT HEAD WITHOUT CONTRAST TECHNIQUE: Contiguous axial images were obtained from the base of the skull through the vertex without intravenous contrast. RADIATION DOSE REDUCTION: This exam was performed according to the departmental dose-optimization program which includes automated exposure control, adjustment of the mA and/or kV according to patient size and/or use of iterative reconstruction technique. COMPARISON:  None Available. FINDINGS: Brain: No acute intracranial findings are seen in noncontrast CT brain. There are no signs of bleeding within the cranium. Cortical sulci are prominent. Vascular: Scattered arterial calcifications are seen. Skull: No acute findings are seen in the calvarium. Sinuses/Orbits: Mucosal thickening is seen in frontal, ethmoid, sphenoid and maxillary sinuses. Other: None. IMPRESSION: No acute intracranial findings are seen.  Atrophy. Chronic pansinusitis. Electronically Signed   By: Ernie Avena M.D.   On: 05/26/2023 16:53    Microbiology: Recent Results (from the past 240 hour(s))  Resp panel by  RT-PCR (RSV, Flu A&B, Covid) Anterior Nasal Swab     Status: None   Collection Time: 05/26/23  5:25 PM   Specimen: Anterior Nasal Swab  Result Value Ref Range Status   SARS Coronavirus 2 by RT PCR NEGATIVE NEGATIVE Final    Comment: (NOTE) SARS-CoV-2 target nucleic acids are NOT DETECTED.  The SARS-CoV-2 RNA is generally detectable in upper respiratory specimens during the acute phase of infection. The lowest concentration of SARS-CoV-2 viral copies this assay can detect is 138 copies/mL. A negative result does not preclude SARS-Cov-2 infection and should not be used as the sole basis for treatment or other patient management decisions. A negative result may occur with  improper specimen collection/handling, submission of specimen other than nasopharyngeal swab, presence of viral mutation(s) within the areas targeted by this assay, and inadequate number of viral copies(<138 copies/mL). A negative result must be combined with clinical observations, patient history, and epidemiological information. The expected result is Negative.  Fact Sheet for Patients:  BloggerCourse.com  Fact Sheet for Healthcare Providers:  SeriousBroker.it  This test is no t yet approved or cleared by the [place] FDA and  has been authorized for detection and/or diagnosis of SARS-CoV-2 by FDA under an Emergency Use Authorization (EUA). This EUA will remain  in effect (meaning this test can be used) for the duration of the COVID-19 declaration under Section 564(b)(1) of the Act, 21 U.S.C.section 360bbb-3(b)(1), unless the authorization is terminated  or revoked sooner.       Influenza A by PCR NEGATIVE NEGATIVE Final   Influenza B by PCR NEGATIVE NEGATIVE Final    Comment: (NOTE) The Xpert Xpress SARS-CoV-2/FLU/RSV plus assay is intended as an aid in the diagnosis of influenza from Nasopharyngeal swab specimens and should not be used as a sole basis  for treatment. Nasal washings and aspirates are unacceptable for Xpert Xpress SARS-CoV-2/FLU/RSV testing.  Fact Sheet for Patients: BloggerCourse.com  Fact Sheet for Healthcare Providers: SeriousBroker.it  This test is not yet approved  or cleared by the [place] and has been authorized for detection and/or diagnosis of SARS-CoV-2 by FDA under an Emergency Use Authorization (EUA). This EUA will remain in effect (meaning this test can be used) for the duration of the COVID-19 declaration under Section 564(b)(1) of the Act, 21 U.S.C. section 360bbb-3(b)(1), unless the authorization is terminated or revoked.     Resp Syncytial Virus by PCR NEGATIVE NEGATIVE Final    Comment: (NOTE) Fact Sheet for Patients: BloggerCourse.com  Fact Sheet for Healthcare Providers: SeriousBroker.it  This test is not yet approved or cleared by the [place] FDA and has been authorized for detection and/or diagnosis of SARS-CoV-2 by FDA under an Emergency Use Authorization (EUA). This EUA will remain in effect (meaning this test can be used) for the duration of the COVID-19 declaration under Section 564(b)(1) of the Act, 21 U.S.C. section 360bbb-3(b)(1), unless the authorization is terminated or revoked.  Performed at [facility], [address], [place], [place] [address]      Labs: Basic Metabolic Panel: Recent Labs  Lab 05/26/23 1519 05/27/23 0355  NA 138 139  K 3.2* 4.5  CL 104 106  CO2 26 25  GLUCOSE 175* 121*  BUN 19 17  CREATININE 1.30* 0.93  CALCIUM 8.6* 9.1   Liver Function Tests: Recent Labs  Lab 05/26/23 1519  AST 21  ALT 17  ALKPHOS 54  BILITOT 0.6  PROT 7.4  ALBUMIN 4.0   No results for input(s): "LIPASE", "AMYLASE" in the last 168 hours. No results for input(s): "AMMONIA" in the last 168 hours. CBC: Recent Labs  Lab 05/26/23 1519  05/27/23 0355  WBC 8.0 6.1  NEUTROABS 6.6  --   HGB 12.5* 12.4*  HCT 39.3 38.3*  MCV 96.6 96.2  PLT 181 188   Cardiac Enzymes: No results for input(s): "CKTOTAL", "CKMB", "CKMBINDEX", "TROPONINI" in the last 168 hours. BNP: BNP (last 3 results) Recent Labs    12/12/22 1255 12/24/22 0954 01/28/23 1055  BNP 719.4* 288.4* 134.4*    ProBNP (last 3 results) Recent Labs    12/10/22 0938  PROBNP 2,546*    CBG: No results for input(s): "GLUCAP" in the last 168 hours.     Signed:  Rhetta Mura MD   [facility] 05/27/2023, 2:19 PM

## 2023-05-27 NOTE — TOC Initial Note (Signed)
Transition of Care [facility]) - Initial/Assessment Note    Patient Details  Name: [name] MRN: [id number] Date of Birth: [birth year]  Transition [facility]) CM/SW Contact:    Howell Rucks, RN Phone Number: 05/27/2023, 10:41 AM  Clinical Narrative:  Met with pt at bedside to introduce role of TOC/NCM and review for dc planning, pt's spouse and dtr at bedside. Pt confirmed he has a PCP and pharmacy in place, no current home care  services, reports he has home DME: walker, cane, BSC, shower chair, pt reports he feels safe returning home with good family support, confirmed he has transportation available at dc. MOON completed, signed , copy given to pt. PT eval pending, await recommendation.                  Expected Discharge Plan: Home w Home Health Services Barriers to Discharge: Continued Medical Work up   Patient Goals and CMS Choice Patient states their goals for this hospitalization and ongoing recovery are:: return home with family support          Expected Discharge Plan and Services       Living arrangements for the past 2 months: Single Family Home                                      Prior Living Arrangements/Services Living arrangements for the past 2 months: Single Family Home Lives with:: Spouse Patient language and need for interpreter reviewed:: Yes Do you feel safe going back to the place where you live?: Yes      Need for Family Participation in Patient Care: Yes (Comment) Care giver support system in place?: Yes (comment) Current home services: DME (walker, cane, BSC, shower  chair) Criminal Activity/Legal Involvement Pertinent to Current Situation/Hospitalization: No - Comment as needed  Activities of Daily Living Home Assistive Devices/Equipment: Cane (specify quad or straight), Blood pressure cuff, Dentures (specify type), Other (Comment) (home telemetry) ADL Screening (condition at time of admission) Patient's cognitive ability adequate  to safely complete daily activities?: Yes Is the patient deaf or have difficulty hearing?: No Does the patient have difficulty seeing, even when wearing glasses/contacts?: No Does the patient have difficulty concentrating, remembering, or making decisions?: No Patient able to express need for assistance with ADLs?: Yes Does the patient have difficulty dressing or bathing?: No Independently performs ADLs?: Yes (appropriate for developmental age) Does the patient have difficulty walking or climbing stairs?: Yes Weakness of Legs: Both Weakness of Arms/Hands: None  Permission Sought/Granted Permission sought to share information with : Case Manager Permission granted to share information with : Yes, Verbal Permission Granted  Share Information with NAME: Fannie Knee, RN           Emotional Assessment Appearance:: Appears stated age Attitude/Demeanor/Rapport: Gracious Affect (typically observed): Accepting Orientation: : Oriented to Self, Oriented to Place, Oriented to  Time, Oriented to Situation Alcohol / Substance Use: Not Applicable Psych Involvement: No (comment)  Admission diagnosis:  Dizziness [R42] Vertigo [R42] Hypertensive urgency [I16.0] Acute pansinusitis, recurrence not specified [J01.40] Episode of confusion [R41.0] Patient Active Problem List   Diagnosis Date Noted   Dizziness 05/26/2023   Hypokalemia 05/26/2023   Vestibular neuritis 05/26/2023   PAF (paroxysmal atrial fibrillation) (HCC) 01/21/2023   Diarrhea 05/19/2019   OSA on CPAP 10/17/2018   Essential hypertension, benign 03/29/2014   BPH (benign prostatic hyperplasia) 03/29/2014   Postoperative anemia  due to acute blood loss 03/29/2014   Fracture, intertrochanteric, left femur (HCC) 03/27/2014   Hip fracture (HCC) 03/27/2014   DYSPNEA 06/05/2009   ESOPHAGEAL STRICTURE 06/04/2009   GERD 06/04/2009   PUD 06/04/2009   DEGENERATIVE DISC DISEASE 06/04/2009   MUSCLE SPASM, BACK 06/04/2009   PCP:  Chilton Greathouse, MD Pharmacy:   [facility] [id number] [place], [place] - [address] [place] [address] Phone: [phone] Fax: [phone]     Social Determinants of Health (SDOH) Social History: SDOH Screenings   Food Insecurity: No Food Insecurity (05/27/2023)  Housing: Low Risk  (05/27/2023)  Transportation Needs: No Transportation Needs (05/27/2023)  Utilities: Not At Risk (05/27/2023)  Tobacco Use: Medium Risk (05/27/2023)   SDOH Interventions:     Readmission Risk Interventions     No data to display

## 2023-05-27 NOTE — TOC Transition Note (Signed)
Transition of Care [facility]) - CM/SW Discharge Note   Patient Details  Name: [name] MRN: [id number] Date of Birth: [birth year]  Transition [facility]) CM/SW Contact:  Howell Rucks, RN Phone Number: 05/27/2023, 2:42 PM   Clinical Narrative:  Pt to dc home today with family.  Vestibular Therapy list provider by hospital Physical Therapist. Patient to  contact PCP to arrange outpatient Vestibular Therapy if referral is required. Per attending, no HH PT required since pt agreeable to outpatient. No further TOC needs identified.     Final next level of care: OP Rehab Barriers to Discharge: Barriers Resolved   Patient Goals and CMS Choice CMS Medicare.gov Compare Post Acute Care list provided to:: Patient Choice offered to / list presented to : Patient  Discharge Placement                         Discharge Plan and Services Additional resources added to the After Visit Summary for                                       Social Determinants of Health (SDOH) Interventions SDOH Screenings   Food Insecurity: No Food Insecurity (05/27/2023)  Housing: Low Risk  (05/27/2023)  Transportation Needs: No Transportation Needs (05/27/2023)  Utilities: Not At Risk (05/27/2023)  Tobacco Use: Medium Risk (05/27/2023)     Readmission Risk Interventions     No data to display

## 2023-05-27 NOTE — Progress Notes (Signed)
Orthostatic vital signs  Lying: BP 158/84 HR 91  Sitting: BP 164/91 HR 97  Standing: BP 151/73 HR 96  Standing at 3 minutes: BP 153/76 HR 96   Pt endorses some slight dizziness after standing for three minutes. Other than that remained asymptomatic.

## 2023-05-27 NOTE — Progress Notes (Signed)
AVS given to patient and explained at the bedside. Medications and follow up appointments have been explained with pt verbalizing understanding.  

## 2023-05-27 NOTE — Care Management Obs Status (Signed)
MEDICARE OBSERVATION STATUS NOTIFICATION   Patient Details  Name: [name] MRN: [id number] Date of Birth: 21-May-1935   Medicare Observation Status Notification Given:       Howell Rucks, RN 05/27/2023, 10:36 AM

## 2023-05-27 NOTE — Progress Notes (Signed)
   05/27/23 0203  BiPAP/CPAP/SIPAP  $ Face Mask Medium Yes  BiPAP/CPAP/SIPAP Pt Type Adult  BiPAP/CPAP/SIPAP DREAMSTATIOND  Mask Type Full face mask  Mask Size Medium  FiO2 (%) 21 %  Patient Home Equipment No  Auto Titrate Yes (5-20)  BiPAP/CPAP /SiPAP Vitals  Pulse Rate 64  SpO2 96 %  Bilateral Breath Sounds Clear  MEWS Score/Color  MEWS Score 0  MEWS Score Color Chilton Si

## 2023-05-27 NOTE — Evaluation (Signed)
Physical Therapy Evaluation Patient Details Name: [name] MRN: [id number] DOB: December 19, 1934 Today's Date: 05/27/2023  History of Present Illness  Pt is 88 yo male admitted on 05/26/23 with dizziness and elevated BP.  Pt diagnosed with peripheral vertigo/vestibular neuritis, HTN, and hypokalemia.  Pt with hx including but not limited to afib s/p DCCV, HTN, OSA, HF, mitral regurgitation, OA,  Clinical Impression  Pt admitted with above diagnosis. At baseline, pt is independent.  He has been experiencing dizziness and instability for 3 weeks with symptoms lasting hours and constant instability compared to baseline.  Symptoms have included spinning sensation, difficulty seeing/floaters, frontal headache, and instability.  Pt additionally reports slight decrease in hearing.  With PT evaluation, pt with + head thrust test for peripheral vertigo, dizziness with head turns/gaze stabilization/head thrust test, and mild instability with gait.  He ambulated 54' without RW requiring min A for balance and 150' with RW requiring CGA but with cautious gait pattern.  He lives with his wife who is not physically able to assist but does have daughter and family nearby.  Agree that pt's symptoms are consistent with vestibular neuritis.  Will benefit from acute PT and recommend follow up with outpt vestibular PT.  Recommend use of RW at home and increased support from family initially.  Will provide with HEP for habituation and corner balance exercises with supervision.  Pt currently with functional limitations due to the deficits listed below (see PT Problem List). Pt will benefit from acute skilled PT to increase their independence and safety with mobility to allow discharge.           If plan is discharge home, recommend the following: A little help with walking and/or transfers;A little help with bathing/dressing/bathroom;Assistance with cooking/housework;Help with stairs or ramp for entrance   Can travel by  private vehicle        Equipment Recommendations Rolling walker (2 wheels)  Recommendations for Other Services       Functional Status Assessment Patient has had a recent decline in their functional status and demonstrates the ability to make significant improvements in function in a reasonable and predictable amount of time.     Precautions / Restrictions Precautions Precautions: Fall      Mobility  Bed Mobility Overal bed mobility: Needs Assistance Bed Mobility: Supine to Sit, Sit to Supine     Supine to sit: Supervision Sit to supine: Supervision        Transfers Overall transfer level: Needs assistance Equipment used: None Transfers: Sit to/from Stand Sit to Stand: Contact guard assist           General transfer comment: CGA for safety    Ambulation/Gait Ambulation/Gait assistance: Contact guard assist, Min assist Gait Distance (Feet): 200 Feet Assistive device: None, Rolling walker (2 wheels)   Gait velocity: decreased     General Gait Details: Started without RW and pt requiring min A at time for balance, particularly with turns.  Improved to only needing contact guard with RW.  He did still demonstrate cautious and slowed gait pattern but no overt LOB.  Cues for segmental turn, focus points, RW proximity, and how to turn with RW  Stairs            Wheelchair Mobility     Tilt Bed    Modified Rankin (Stroke Patients Only)       Balance Overall balance assessment: Needs assistance Sitting-balance support: No upper extremity supported Sitting balance-Leahy Scale: Good     Standing balance  support: Bilateral upper extremity supported, No upper extremity supported Standing balance-Leahy Scale: Fair Standing balance comment: mild unsteadiness without RW during ambulation requiring min A at times but could static stand                             Pertinent Vitals/Pain Pain Assessment Pain Assessment: Faces Faces Pain  Scale: Hurts a little bit Pain Location: frontal headache Pain Descriptors / Indicators: Headache Pain Intervention(s): Limited activity within patient's tolerance, Monitored during session    Home Living Family/patient expects to be discharged to:: Private residence Living Arrangements: Spouse/significant other Available Help at Discharge: Family;Available PRN/intermittently;Available 24 hours/day (Lives with wife but she is unable to assist; daughter nearby and could check) Type of Home:  (condo) Home Access: Stairs to enter Entrance Stairs-Rails: Left Entrance Stairs-Number of Steps: 3   Home Layout: One level Home Equipment: Agricultural consultant (2 wheels);Cane - single point;Rollator (4 wheels);Shower seat;Toilet riser;Wheelchair - manual;BSC/3in1 Additional Comments: RW and rollator are his wife's    Prior Function Prior Level of Function : Independent/Modified Independent;Driving             Mobility Comments: Could ambulate in community without AD; no falls ADLs Comments: independent adls and iadls     Extremity/Trunk Assessment   Upper Extremity Assessment Upper Extremity Assessment: Overall WFL for tasks assessed    Lower Extremity Assessment Lower Extremity Assessment: Overall WFL for tasks assessed    Cervical / Trunk Assessment Cervical / Trunk Assessment: Normal  Communication   Communication Communication: No apparent difficulties  Cognition Arousal: Alert Behavior During Therapy: WFL for tasks assessed/performed Overall Cognitive Status: Within Functional Limits for tasks assessed                                 General Comments: Daughter , granddaughter, wife present        General Comments   Vestibular Eval:   History:  Exercises  About 3 weeks ago he took his wife to an appointment, went out the parking lot and could not find his car. Had to honk the horn and then realized the car was just right in front of him. He then drove up to  pick up his wife but saw a lot of floaters and almost couldn't see. Felt dizzy with spinning sensation. Had to get his wife to drive afterwards. For the past few weeks continued to have dizziness symptoms when he gets up. Has to hold on to the walls to steady himself. Has trouble seeing and feeling like he might pass out. Has associated generalized weakness. Also has frontal headaches. No nausea or vomiting. No loss of bowel or bladder function. Today he was just sitting and playing solitary on his phone and just seeing the cards move across the screen provoked his symptoms of again seeing floaters and dizziness.  He reports that symptoms last for hours.  Also, reports slight decrease in hearing. Has not had in past.  Does not occur with rolling in bed.  Medical Testing:   MRI IMPRESSION: 1. No acute intracranial pathology. 2. Pansinusitis with layering fluid which could reflect acute sinusitis in the correct clinical setting. 3. Trace right mastoid effusion  Orthostatic BP - negative with nursing earlier  MD Notes:  Vestibular neuritis: will start a burst of prednisone with taper over the next week, continue Augmentin for tx of sinusitis,  meclizine PRN  Objective Testing: Smooth Pursuit: intact, no nystagmus, no symptoms Test of Skew: negative Head Thrust: Positive for peripheral vertigo, + symptoms VOR/Gaze stabilization: Intact, minimal symptoms Head turns: some dizziness Saccades: intact BPPV testing: not performed, symptoms not consistent  Daughter does report pt seems less confident/stable than at home.  Educated on: Vestibular PT, symptoms consistent with vestibular neuritis, will treat with habituation and balance exercises, not driving, recommend use of RW, recommend f/u outpt vestibular PT, recommend increased support from family initially but could return home when medically cleared as pt ambulating without LOB with RW.   Left to print exercises when returned pt eating.  Will  F/u in afternoon with exercises   Assessment/Plan    PT Assessment Patient needs continued PT services  PT Problem List Decreased coordination;Decreased activity tolerance;Decreased knowledge of use of DME;Decreased balance;Decreased safety awareness;Decreased mobility       PT Treatment Interventions DME instruction;Therapeutic exercise;Gait training;Balance training;Neuromuscular re-education;Functional mobility training;Therapeutic activities;Patient/family education;Stair training;Other (comment) (habituation exercises, VOR and gaze stabilization)    PT Goals (Current goals can be found in the Care Plan section)  Acute Rehab PT Goals Patient Stated Goal: improve dizziness PT Goal Formulation: With patient/family Time For Goal Achievement: 06/10/23 Potential to Achieve Goals: Good    Frequency Min 1X/week     Co-evaluation               AM-PAC PT "6 Clicks" Mobility  Outcome Measure Help needed turning from your back to your side while in a flat bed without using bedrails?: None Help needed moving from lying on your back to sitting on the side of a flat bed without using bedrails?: A Little Help needed moving to and from a bed to a chair (including a wheelchair)?: A Little Help needed standing up from a chair using your arms (e.g., wheelchair or bedside chair)?: A Little Help needed to walk in hospital room?: A Little Help needed climbing 3-5 steps with a railing? : A Little 6 Click Score: 19    End of Session Equipment Utilized During Treatment: Gait belt Activity Tolerance: Patient tolerated treatment well Patient left: in bed;with call bell/phone within reach;with family/visitor present;with bed alarm set Nurse Communication: Mobility status PT Visit Diagnosis: Other abnormalities of gait and mobility (R26.89);Dizziness and giddiness (R42)    Time: 5366-4403 PT Time Calculation (min) (ACUTE ONLY): 30 min   Charges:   PT Evaluation $PT Eval Low Complexity: 1  Low PT Treatments $Gait Training: 8-22 mins PT General Charges $$ ACUTE PT VISIT: 1 Visit         Anise Salvo, PT Acute Rehab [facility] [phone]   Rayetta Humphrey 05/27/2023, 1:43 PM

## 2023-05-27 NOTE — Progress Notes (Signed)
Physical Therapy Treatment Patient Details Name: [name] MRN: [id number] DOB: 12-Sep-1935 Today's Date: 05/27/2023   History of Present Illness Pt is 88 yo male admitted on 05/26/23 with dizziness and elevated BP.  Pt diagnosed with peripheral vertigo/vestibular neuritis, HTN, and hypokalemia.  Pt with hx including but not limited to afib s/p DCCV, HTN, OSA, HF, mitral regurgitation, OA,    PT Comments  Returned to provide HEP and ambulated again.  Pt seemed to have improved confidence with improved gait stability and speed with RW.  He had good carryover for safety recommendations, transfers techniques, and segmental turns.  Pt tolerated habituation and corner balance exercises well.  Continue to recommend RW at home and outpt vestibular PT (provided list of [facility] vest[facility] clinics, but instructed can use any clinic of choice)    If plan is discharge home, recommend the following: A little help with walking and/or transfers;A little help with bathing/dressing/bathroom;Assistance with cooking/housework;Help with stairs or ramp for entrance   Can travel by private vehicle        Equipment Recommendations  Rolling walker (2 wheels)    Recommendations for Other Services       Precautions / Restrictions Precautions Precautions: Fall     Mobility  Bed Mobility Overal bed mobility: Needs Assistance Bed Mobility: Supine to Sit, Sit to Supine     Supine to sit: Modified independent (Device/Increase time) Sit to supine: Modified independent (Device/Increase time)        Transfers Overall transfer level: Needs assistance Equipment used: Rolling walker (2 wheels) Transfers: Sit to/from Stand Sit to Stand: Supervision           General transfer comment: CGA for safety    Ambulation/Gait Ambulation/Gait assistance: Supervision Gait Distance (Feet): 200 Feet Assistive device: Rolling walker (2 wheels)   Gait velocity: decreased     General Gait Details: Ambulated in  hallway and around obstacles - pt with good carryover of segmental turns and RW use; improved speed and stability   Stairs             Wheelchair Mobility     Tilt Bed    Modified Rankin (Stroke Patients Only)       Balance Overall balance assessment: Needs assistance Sitting-balance support: No upper extremity supported Sitting balance-Leahy Scale: Good     Standing balance support: Bilateral upper extremity supported, No upper extremity supported Standing balance-Leahy Scale: Good Standing balance comment: RW to ambulate, performed corner balance exercises without UE support               High Level Balance Comments: Corner balance: feet apart EO and EC, feet together EO and EC, feet apart head turns EO and EC            Cognition Arousal: Alert Behavior During Therapy: WFL for tasks assessed/performed Overall Cognitive Status: Within Functional Limits for tasks assessed                                 General Comments: granddaughter, wife present        Exercises Other Exercises Other Exercises: Habituation Exercises 5 reps in sitting: Smooth Pursuit (vertical and horizontal), Gaze stabiliation (turns/nods), VOR cancellation, Rapid Head Turns, Head Thrust Saccade back to focus point    General Comments   HEP: Access Code: [email or web address] URL: [email or web address] Date: 05/27/2023 Prepared by: Royetta Asal  Program Notes Additional Exercise: Head Thrust then find focus point.  In sitting, do quick head turn to right then eyes back on focus point in middle.  Repeat L side.  Do 10 reps.   Exercises - Seated Horizontal Smooth Pursuit  - 3 x daily - 7 x weekly - 1 sets - 10 reps - Seated Vertical Smooth Pursuit  - 3 x daily - 7 x weekly - 1 sets - 10 reps - Seated Gaze Stabilization with Head Rotation  - 3 x daily - 7 x weekly - 1 sets - 10 reps - Seated Gaze Stabilization with Head Nod  - 3 x daily - 7 x weekly - 1 sets - 10  reps - Seated VOR Cancellation  - 3 x daily - 7 x weekly - 1 sets - 10 reps - Seated Cervical Rotation AROM  - 3 x daily - 7 x weekly - 1 sets - 10 reps  Access Code: [email or web address] URL: [email or web address] Date: 05/27/2023 Prepared by: Royetta Asal  Program Notes For 1st 3 exercises hold as able.   Exercises - Standing Balance in Corner  - 1 x daily - 7 x weekly - 3 sets - 1 reps - Standing Balance in Corner with Eyes Closed  - 1 x daily - 7 x weekly - 3 sets - 1 reps - Corner Balance Feet Together With Eyes Open  - 1 x daily - 7 x weekly - 3 sets - 1 reps - Corner Balance Feet Apart: Eyes Open With Head Turns  - 1 x daily - 7 x weekly - 1 sets - 10 reps     Pertinent Vitals/Pain Pain Assessment Pain Assessment: Faces Faces Pain Scale: Hurts a little bit Pain Location: frontal headache Pain Descriptors / Indicators: Headache Pain Intervention(s): Limited activity within patient's tolerance, Monitored during session    Home Living Family/patient expects to be discharged to:: Private residence Living Arrangements: Spouse/significant other Available Help at Discharge: Family;Available PRN/intermittently;Available 24 hours/day (Lives with wife but she is unable to assist; daughter nearby and could check) Type of Home:  (condo) Home Access: Stairs to enter Entrance Stairs-Rails: Left Entrance Stairs-Number of Steps: 3   Home Layout: One level Home Equipment: Agricultural consultant (2 wheels);Cane - single point;Rollator (4 wheels);Shower seat;Toilet riser;Wheelchair - manual;BSC/3in1 Additional Comments: RW and rollator are his wife's    Prior Function            PT Goals (current goals can now be found in the care plan section) Acute Rehab PT Goals Patient Stated Goal: improve dizziness PT Goal Formulation: With patient/family Time For Goal Achievement: 06/10/23 Potential to Achieve Goals: Good Progress towards PT goals: Progressing toward goals    Frequency     Min 1X/week      PT Plan      Co-evaluation              AM-PAC PT "6 Clicks" Mobility   Outcome Measure  Help needed turning from your back to your side while in a flat bed without using bedrails?: None Help needed moving from lying on your back to sitting on the side of a flat bed without using bedrails?: None Help needed moving to and from a bed to a chair (including a wheelchair)?: A Little Help needed standing up from a chair using your arms (e.g., wheelchair or bedside chair)?: A Little Help needed to walk in hospital room?: A Little Help needed climbing 3-5 steps with a railing? : A Little 6 Click Score: 20    End of Session Equipment Utilized  During Treatment: Gait belt Activity Tolerance: Patient tolerated treatment well Patient left: in bed;with call bell/phone within reach;with family/visitor present;with bed alarm set Nurse Communication: Mobility status PT Visit Diagnosis: Other abnormalities of gait and mobility (R26.89);Dizziness and giddiness (R42)     Time: 5409-8119 PT Time Calculation (min) (ACUTE ONLY): 20 min  Charges:    $Gait Training: 8-22 mins $Neuromuscular Re-education: 8-22 mins PT General Charges $$ ACUTE PT VISIT: 1 Visit                     Anise Salvo, PT Acute Rehab [facility] [phone]    Rayetta Humphrey 05/27/2023, 2:49 PM

## 2023-05-27 NOTE — Plan of Care (Signed)
?  Problem: Clinical Measurements: ?Goal: Diagnostic test results will improve ?Outcome: Progressing ?Goal: Cardiovascular complication will be avoided ?Outcome: Progressing ?  ?Problem: Safety: ?Goal: Ability to remain free from injury will improve ?Outcome: Progressing ?  ?

## 2023-05-29 ENCOUNTER — Telehealth: Payer: Self-pay | Admitting: Cardiology

## 2023-05-29 NOTE — Telephone Encounter (Signed)
Patient  went to ED on 8/13 due to vertigo and BP issues.   States he was dehydrated, per daughter .  He has continued all prescriptions as noted in ED visit  with exception amlodipine.  She states they did not tell her to continue his amlodipine. Does show in ED visit to resume .Continues  Lasix 20 mg per ER visit as well. She states his BP remains elevated since the ED visit  with SBP in the 180's.  She ask him to resume Amlodipine and request what dosage the provider recommends

## 2023-05-29 NOTE — Telephone Encounter (Signed)
Pt c/o BP issue: STAT if pt c/o blurred vision, one-sided weakness or slurred speech  1. What are your last 5 BP readings?   161/82 169/79 165/87 145/77 190/84 (while in hospital which came down before patient left)  2. Are you having any other symptoms (ex. Dizziness, headache, blurred vision, passed out)?   Headache, dizziness  3. What is your BP issue?    Daughter stated patient was in the hospital  due to his BP.  Daughter stated patient's HR has been 68-80.  Daughter stated patient was taken off amlodipine and wants to know if he should be back on this medication.

## 2023-05-29 NOTE — Telephone Encounter (Signed)
Returned call to pt's daughter [name]-cell phone states call can not be completed as dialed. Home phone-went to vm. Left message to call back and that a message was sent to MyChart as well.

## 2023-05-29 NOTE — Telephone Encounter (Signed)
Patient daughter returned call ?

## 2023-05-29 NOTE — Telephone Encounter (Signed)
Call to daughter advised to please call  for Doctor recommendations  Looks like BP was quite variable in hospital so I would resume amlodipine 5 mg daily to start with and monitor BP  Peter [place] MD, [facility]

## 2023-06-03 ENCOUNTER — Telehealth: Payer: Self-pay | Admitting: Cardiology

## 2023-06-03 DIAGNOSIS — H8120 Vestibular neuronitis, unspecified ear: Secondary | ICD-10-CM | POA: Diagnosis not present

## 2023-06-03 DIAGNOSIS — I509 Heart failure, unspecified: Secondary | ICD-10-CM | POA: Diagnosis not present

## 2023-06-03 DIAGNOSIS — S0300XA Dislocation of jaw, unspecified side, initial encounter: Secondary | ICD-10-CM | POA: Diagnosis not present

## 2023-06-03 DIAGNOSIS — F439 Reaction to severe stress, unspecified: Secondary | ICD-10-CM | POA: Diagnosis not present

## 2023-06-03 DIAGNOSIS — I130 Hypertensive heart and chronic kidney disease with heart failure and stage 1 through stage 4 chronic kidney disease, or unspecified chronic kidney disease: Secondary | ICD-10-CM | POA: Diagnosis not present

## 2023-06-03 DIAGNOSIS — F0390 Unspecified dementia without behavioral disturbance: Secondary | ICD-10-CM | POA: Diagnosis not present

## 2023-06-03 DIAGNOSIS — I4891 Unspecified atrial fibrillation: Secondary | ICD-10-CM | POA: Diagnosis not present

## 2023-06-03 DIAGNOSIS — N1831 Chronic kidney disease, stage 3a: Secondary | ICD-10-CM | POA: Diagnosis not present

## 2023-06-03 NOTE — Telephone Encounter (Signed)
Patient's daughter states patient went to the office on 4/24 to be evaluated for a CPAP machine, but the patient has not received any updates. She would like a call back to discuss.

## 2023-06-05 ENCOUNTER — Ambulatory Visit (HOSPITAL_COMMUNITY)
Admission: RE | Admit: 2023-06-05 | Discharge: 2023-06-05 | Disposition: A | Discharge status: Home / Self Care | Payer: Medicare HMO | Source: Ambulatory Visit | Attending: Internal Medicine | Admitting: Internal Medicine

## 2023-06-05 ENCOUNTER — Inpatient Hospital Stay (HOSPITAL_COMMUNITY)
Admission: RE | Admit: 2023-06-05 | Discharge: 2023-06-05 | Disposition: A | Discharge status: Home / Self Care | Payer: Medicare HMO | Source: Ambulatory Visit | Attending: Internal Medicine | Admitting: Internal Medicine

## 2023-06-05 ENCOUNTER — Other Ambulatory Visit (HOSPITAL_COMMUNITY): Payer: Self-pay | Admitting: Internal Medicine

## 2023-06-05 VITALS — BP 136/70 | HR 75 | Wt 143.0 lb

## 2023-06-05 DIAGNOSIS — R55 Syncope and collapse: Secondary | ICD-10-CM | POA: Diagnosis not present

## 2023-06-05 DIAGNOSIS — R42 Dizziness and giddiness: Secondary | ICD-10-CM | POA: Diagnosis not present

## 2023-06-05 DIAGNOSIS — I130 Hypertensive heart and chronic kidney disease with heart failure and stage 1 through stage 4 chronic kidney disease, or unspecified chronic kidney disease: Secondary | ICD-10-CM | POA: Diagnosis not present

## 2023-06-05 DIAGNOSIS — R0602 Shortness of breath: Secondary | ICD-10-CM

## 2023-06-05 DIAGNOSIS — I5032 Chronic diastolic (congestive) heart failure: Secondary | ICD-10-CM | POA: Diagnosis not present

## 2023-06-05 DIAGNOSIS — K222 Esophageal obstruction: Secondary | ICD-10-CM | POA: Diagnosis not present

## 2023-06-05 DIAGNOSIS — I340 Nonrheumatic mitral (valve) insufficiency: Secondary | ICD-10-CM | POA: Diagnosis not present

## 2023-06-05 DIAGNOSIS — N1832 Chronic kidney disease, stage 3b: Secondary | ICD-10-CM | POA: Diagnosis not present

## 2023-06-05 DIAGNOSIS — E876 Hypokalemia: Secondary | ICD-10-CM | POA: Diagnosis not present

## 2023-06-05 DIAGNOSIS — I10 Essential (primary) hypertension: Secondary | ICD-10-CM

## 2023-06-05 DIAGNOSIS — I480 Paroxysmal atrial fibrillation: Secondary | ICD-10-CM

## 2023-06-05 DIAGNOSIS — D62 Acute posthemorrhagic anemia: Secondary | ICD-10-CM

## 2023-06-05 DIAGNOSIS — G4733 Obstructive sleep apnea (adult) (pediatric): Secondary | ICD-10-CM | POA: Diagnosis not present

## 2023-06-05 DIAGNOSIS — G2581 Restless legs syndrome: Secondary | ICD-10-CM | POA: Diagnosis present

## 2023-06-05 LAB — BASIC METABOLIC PANEL
Anion gap: 10 (ref 5–15)
BUN: 19 mg/dL (ref 8–23)
CO2: 24 mmol/L (ref 22–32)
Calcium: 8.5 mg/dL — ABNORMAL LOW (ref 8.9–10.3)
Chloride: 101 mmol/L (ref 98–111)
Creatinine, Ser: 1.38 mg/dL — ABNORMAL HIGH (ref 0.61–1.24)
GFR, Estimated: 50 mL/min — ABNORMAL LOW (ref >60)
Glucose, Bld: 89 mg/dL (ref 70–99)
Potassium: 4.2 mmol/L (ref 3.5–5.1)
Sodium: 135 mmol/L (ref 135–145)

## 2023-06-05 LAB — CBC
HCT: 40.7 % (ref 39.0–52.0)
Hemoglobin: 13.2 g/dL (ref 13.0–17.0)
MCH: 31.4 pg (ref 26.0–34.0)
MCHC: 32.4 g/dL (ref 30.0–36.0)
MCV: 96.7 fL (ref 80.0–100.0)
Platelets: 240 10*3/uL (ref 150–400)
RBC: 4.21 MIL/uL — ABNORMAL LOW (ref 4.22–5.81)
RDW: 15.1 % (ref 11.5–15.5)
WBC: 6.5 10*3/uL (ref 4.0–10.5)
nRBC: 0 % (ref 0.0–0.2)

## 2023-06-05 LAB — SEDIMENTATION RATE: Sed Rate: 4 mm/hr (ref 0–16)

## 2023-06-05 NOTE — Progress Notes (Signed)
ADVANCED HF CLINIC NOTE  Referring Physician: Chilton Greathouse, MD Primary Care: Chilton Greathouse, MD Primary Cardiologist: Peter Swaziland, MD  HPI:  [name] is a 88 y.o. male who is seen for follow up. I care for his wife [name] and he is Dr [name] father in law. He has a history of HTN and OSA on CPAP. Prior Myoview in 2010 was normal. He had a cardiac cath in 2018 showing normal coronaries and normal right heart hemodynamics. Echo in 2018 showed mild MR-otherwise normal. He is followed by Dr Mayford Knife for OSA. Has restless leg syndrome. Prior arterial dopplers normal.    Developed new onset AF in 2/24   Was seen by Dr. Swaziland earlier this week. Lasix increased to 40/20. Scr now 2.1   Had DC-CV 12/16/22 lasted only a few days. Loaded Flecainide and converted. Had recurrent AF on Flecainide 50 bid. Now maintaining NSR on 100 bid   Admitted last week (05/26/23) had 2 episodes of abrupt syncope followed by confusion. Denies fall, seizure-like activity, palpitations. CT head showed pansinusitits but otherwise normal. MRI brain showed pansinusitis possible trace fluid in mastoid-neurology over read the studies as a curbside and it was felt ultimately that patient had a vestibular neuronitis/myelitis BP was very high. Treated with steroids and abx. Referred for vestibular rehab (starts Monday). Did not have formal Neuro consult.   Here for f/u. No further episodes of syncope. Mild HA. No energy. Wearing CPAP. SBP running 130-160   TEE 3/24 EF 55-60% mod-sev MR   Has repeat echo 02/17/23   Past Medical History:  Diagnosis Date   Anemia    Arthritis    osteoarthritis  back   Celiac disease    Chronic kidney disease    Collagenous colitis    Diverticulosis    Enlarged prostate    followed by dr  [name]   Esophageal dysmotility    Esophageal dysmotility    Esophageal stenosis    Essential hypertension, benign 03/29/2014   External hemorrhoids    External hemorrhoids    Full  dentures    GERD (gastroesophageal reflux disease)    Hemorrhoids    Hiatal hernia    IBS (irritable bowel syndrome)    Internal hemorrhoids    OSA on CPAP 10/17/2018   Osteoporosis    Postoperative anemia due to acute blood loss 03/29/2014   Schatzki's ring    Sleep apnea    CPAP Machine   Ulcer     Current Outpatient Medications  Medication Sig Dispense Refill   acetaminophen (TYLENOL) 500 MG tablet Take 1,000 mg by mouth as needed for mild pain or moderate pain.     albuterol (PROVENTIL HFA;VENTOLIN HFA) 108 (90 Base) MCG/ACT inhaler Inhale 2 puffs into the lungs every 6 (six) hours as needed for wheezing or shortness of breath.     apixaban (ELIQUIS) 5 MG TABS tablet Take 2.5 mg by mouth 2 (two) times daily.     diclofenac Sodium (VOLTAREN) 1 % GEL Apply 2 g topically 4 (four) times daily as needed (pain.).     dicyclomine (BENTYL) 10 MG capsule Take 10 mg by mouth 2 (two) times daily as needed (abdominal pain/spasms.).     doxazosin (CARDURA) 8 MG tablet Take 4 mg by mouth daily.     famotidine (PEPCID) 40 MG tablet Take 1 tablet (40 mg total) by mouth 2 (two) times daily. 180 tablet 2   ferrous sulfate 325 (65 FE) MG tablet Take 325 mg by mouth  at bedtime.     flecainide (TAMBOCOR) 100 MG tablet Take 1 tablet (100 mg total) by mouth 2 (two) times daily. 180 tablet 3   furosemide (LASIX) 20 MG tablet Take 1 tablet (20 mg total) by mouth daily.     gabapentin (NEURONTIN) 600 MG tablet Take 600 mg by mouth at bedtime.     loratadine (CLARITIN) 10 MG tablet Take 10 mg by mouth in the morning.     magnesium oxide (MAG-OX) 400 (240 Mg) MG tablet Take 400 mg by mouth in the morning.     potassium chloride SA (KLOR-CON M) 20 MEQ tablet Take 1 tablet (20 mEq total) by mouth daily with supper. 90 tablet 3   predniSONE (DELTASONE) 10 MG tablet 60 mg daily on days 2 through 5, 40 mg on day 6, 30 mg on day 7, 20 mg on day 8, 10 mg on day 9, and 5 mg on day 10. 16 tablet 0   sertraline (ZOLOFT)  50 MG tablet Take 25 mg by mouth at bedtime.     zolpidem (AMBIEN) 10 MG tablet Take 5 mg by mouth at bedtime as needed for sleep.     No current facility-administered medications for this encounter.    Allergies  Allergen Reactions   Gluten Meal     Celiac Disease   Wheat Other (See Comments)    Celiac Disease      Social History   Socioeconomic History   Marital status: Married    Spouse name: Not on file   Number of children: 3   Years of education: Not on file   Highest education level: Not on file  Occupational History   Occupation: retired  Tobacco Use   Smoking status: Former    Current packs/day: 0.00    Types: Cigarettes    Quit date: 08/11/1981    Years since quitting: 41.8   Smokeless tobacco: Never  Vaping Use   Vaping status: Never Used  Substance and Sexual Activity   Alcohol use: Yes    Comment: ocassionally   Drug use: No   Sexual activity: Not on file  Other Topics Concern   Not on file  Social History Narrative   Not on file   Social Determinants of Health   Financial Resource Strain: Not on file  Food Insecurity: No Food Insecurity (05/27/2023)   Hunger Vital Sign    Worried About Running Out of Food in the Last Year: Never true    Ran Out of Food in the Last Year: Never true  Transportation Needs: No Transportation Needs (05/27/2023)   PRAPARE - Administrator, Civil Service (Medical): No    Lack of Transportation (Non-Medical): No  Physical Activity: Not on file  Stress: Not on file  Social Connections: Not on file  Intimate Partner Violence: Not At Risk (05/27/2023)   Humiliation, Afraid, Rape, and Kick questionnaire    Fear of Current or Ex-Partner: No    Emotionally Abused: No    Physically Abused: No    Sexually Abused: No      Family History  Problem Relation Age of Onset   Cervical cancer Mother    Colon cancer Mother    Stroke Father    Heart attack Father    Breast cancer Sister    Throat cancer Brother     Liver cancer Brother    Brain cancer Brother    Esophageal cancer Neg Hx    Rectal cancer Neg Hx  Stomach cancer Neg Hx     Vitals:   06/05/23 0939  BP: 136/70  Pulse: 75  SpO2: 94%  Weight: 64.9 kg (143 lb)     PHYSICAL EXAM: General:  Elderly weak appearing. No resp difficulty HEENT: normal Neck: supple. no JVD. Carotids 2+ bilat; no bruits. No lymphadenopathy or thryomegaly appreciated. Cor: PMI nondisplaced. Regular rate & rhythm. No rubs, gallops or murmurs. Lungs: clear Abdomen: soft, nontender, nondistended. No hepatosplenomegaly. No bruits or masses. Good bowel sounds. Extremities: no cyanosis, clubbing, rash, edema Neuro: alert & orientedx3, cranial nerves grossly intact. moves all 4 extremities w/o difficulty. Affect pleasant   ECG: NSR 71 1AVB 244 ms. QRS 106 No ST-T wave abnormalities.    ASSESSMENT & PLAN:  1. Syncope/pre-syncope - doubt these are cardiac episodes - will place Zio patch to make sure no arrhythmias - If recurs will need Neuro eval - Check ESR to exclude temporal arteritis   2. Chronic diastolic HF in setting of AF - TEE 3/24 EF 55-60% mod-sev MR - much improved with return of NSR  - Volume status looks good  3. PAF - new-onset 2/24 - now mai ntaining NSR on flecainide 100 bid.  - Continue Eliquis - Zio patch as above  4. OSA - continue CPAP  5. CKD 3b - baseline Scr 1.5-1.6 - recheck today  - Consider switching lasix to low-dose Farxiga   6. HTN - BP mildly elevated - If SBP consistently > 150 can increase amlodipine to 10 daily  7. Mitral regurgitation - I reviewed TEE. Seems more moderate range - Echo 5/25 EF 55-60% mild to mod MR  Arvilla Meres, MD  10:04 AM

## 2023-06-05 NOTE — Patient Instructions (Signed)
If blood pressure is consistently above 150 systolic, increase Amlodipine to 10 mg daily.  Labs done today, your results will be available in MyChart, we will contact you for abnormal readings.  Your provider has recommended that  you wear a Zio Patch for 14 days.  This monitor will record your heart rhythm for our review.  IF you have any symptoms while wearing the monitor please press the button.  If you have any issues with the patch or you notice a red or orange light on it please call the company at [phone].  Once you remove the patch please mail it back to the company as soon as possible so we can get the results.  Your physician recommends that you schedule a follow-up appointment in: 4 months.  If you have any questions or concerns before your next appointment please send [place] a message through [place] or call our office at [phone].    TO LEAVE A MESSAGE FOR THE NURSE SELECT OPTION 2, PLEASE LEAVE A MESSAGE INCLUDING: YOUR NAME DATE OF BIRTH CALL BACK NUMBER REASON FOR CALL**this is important as we prioritize the call backs  YOU WILL RECEIVE A CALL BACK THE SAME DAY AS LONG AS YOU CALL BEFORE 4:00 PM  At the [facility], you and your health needs are our priority. As part of our continuing mission to provide you with exceptional heart care, we have created designated Provider Care Teams. These Care Teams include your primary Cardiologist (physician) and Advanced Practice Providers (APPs- Physician Assistants and Nurse Practitioners) who all work together to provide you with the care you need, when you need it.   You may see any of the following providers on your designated Care Team at your next follow up: Dr Arvilla Meres Dr Marca Ancona Dr. Marcos Eke, NP Robbie Lis, [place] [facility] [place], [place] Brynda Peon, NP Karle Plumber, PharmD   Please be sure to bring in all your medications bottles to every appointment.     Thank you for choosing [facility]

## 2023-06-09 NOTE — Therapy (Signed)
OUTPATIENT PHYSICAL THERAPY VESTIBULAR EVALUATION     Patient Name: [name] MRN: [id number] DOB:06/18/1935, 88 y.o., male Today's Date: 06/10/2023  END OF SESSION:  PT End of Session - 06/10/23 0852     Visit Number 1    Number of Visits 10    Date for PT Re-Evaluation 07/22/23    Authorization Type [employer]    Authorization Time Period auth submitted    PT Start Time 0800    PT Stop Time 0838    PT Time Calculation (min) 38 min    Activity Tolerance Patient tolerated treatment well    Behavior During Therapy [facility] for tasks assessed/performed             Past Medical History:  Diagnosis Date   Anemia    Arthritis    osteoarthritis  back   Celiac disease    Chronic kidney disease    Collagenous colitis    Diverticulosis    Enlarged prostate    followed by dr  [name]   Esophageal dysmotility    Esophageal dysmotility    Esophageal stenosis    Essential hypertension, benign 03/29/2014   External hemorrhoids    External hemorrhoids    Full dentures    GERD (gastroesophageal reflux disease)    Hemorrhoids    Hiatal hernia    IBS (irritable bowel syndrome)    Internal hemorrhoids    OSA on CPAP 10/17/2018   Osteoporosis    Postoperative anemia due to acute blood loss 03/29/2014   Schatzki's ring    Sleep apnea    CPAP Machine   Ulcer    Past Surgical History:  Procedure Laterality Date   APPENDECTOMY  1963   CARDIOVERSION N/A 12/16/2022   Procedure: CARDIOVERSION;  Surgeon: Lewayne Bunting, MD;  Location: [facility] ENDOSCOPY;  Service: Cardiovascular;  Laterality: N/A;   ESOPHAGOGASTRODUODENOSCOPY  01/09/2022   EYE SURGERY Bilateral    cataract removal - implants   HERNIA REPAIR  2002   right inguinal    HIP SURGERY Left    INGUINAL HERNIA REPAIR  08/19/2011   Procedure: HERNIA REPAIR  left INGUINAL ADULT;  Surgeon: Adolph Pollack, MD;  Location: [facility] ORS;  Service: General;  Laterality: Left;   INTRAMEDULLARY (IM) NAIL INTERTROCHANTERIC Left  03/27/2014   Procedure: INTRAMEDULLARY (IM) NAIL INTERTROCHANTRIC;  Surgeon: Shelda Pal, MD;  Location: [facility] ORS;  Service: Orthopedics;  Laterality: Left;   RIGHT/LEFT HEART CATH AND CORONARY ANGIOGRAPHY N/A 03/12/2017   Procedure: Right/Left Heart Cath and Coronary Angiography;  Surgeon: Dolores Patty, MD;  Location: [facility] INVASIVE CV LAB;  Service: Cardiovascular;  Laterality: N/A;   TEE WITHOUT CARDIOVERSION N/A 12/16/2022   Procedure: TRANSESOPHAGEAL ECHOCARDIOGRAM (TEE);  Surgeon: Lewayne Bunting, MD;  Location: [facility] ENDOSCOPY;  Service: Cardiovascular;  Laterality: N/A;   UPPER GASTROINTESTINAL ENDOSCOPY  2008   Patient Active Problem List   Diagnosis Date Noted   Dizziness 05/26/2023   Hypokalemia 05/26/2023   Vestibular neuritis 05/26/2023   PAF (paroxysmal atrial fibrillation) (HCC) 01/21/2023   Diarrhea 05/19/2019   OSA on CPAP 10/17/2018   Essential hypertension, benign 03/29/2014   BPH (benign prostatic hyperplasia) 03/29/2014   Postoperative anemia due to acute blood loss 03/29/2014   Fracture, intertrochanteric, left femur (HCC) 03/27/2014   Hip fracture (HCC) 03/27/2014   DYSPNEA 06/05/2009   ESOPHAGEAL STRICTURE 06/04/2009   GERD 06/04/2009   PUD 06/04/2009   DEGENERATIVE DISC DISEASE 06/04/2009   MUSCLE SPASM, BACK 06/04/2009  PCP:    Chilton Greathouse, MD   REFERRING PROVIDER:    Chilton Greathouse, MD    REFERRING DIAG: R42 (ICD-10-CM) - Dizziness and giddiness  THERAPY DIAG:  Dizziness and giddiness  Unsteadiness on feet  ONSET DATE: 05/26/23  Rationale for Evaluation and Treatment: Rehabilitation  SUBJECTIVE:   SUBJECTIVE STATEMENT: Dizziness started earlier this month. Prednisone improved the symptoms. Still dizzy when getting out of chair, getting out of bed, bending over, quick head movements. Reports that he feels that on first day of onset, he blacked out and saw a "desert". Dizziness is described as "off balance" and lasts seconds. Was  not using cane at PLOF. Denies head trauma, infection/illness, vision changes/double vision, hearing loss, tinnitus, hx migraines.     Pt accompanied by: self  PERTINENT HISTORY: Anemia, CKD, HTN, GERD, osteoporosis, L hip IM nailing  PAIN:  Are you having pain? No  PRECAUTIONS: Fall; currently wearing heart monitor   RED FLAGS: None   WEIGHT BEARING RESTRICTIONS: No  FALLS: Has patient fallen in last 6 months? No  LIVING ENVIRONMENT: Lives with: lives with their spouse Lives in: House/apartment Stairs:  3 steps to enter with handrail; 1 story Has following equipment at home: Single point cane, Environmental consultant - 2 wheeled, shower chair, and Grab bars  PLOF: Independent, retired  PATIENT GOALS: return to [employer] at [employer]   OBJECTIVE:   DIAGNOSTIC FINDINGS: 05/26/23 brain MRI: No acute intracranial pathology. Pansinusitis with layering fluid which could reflect acute sinusitis in the correct clinical setting. Trace right mastoid effusion.  COGNITION: Overall cognitive status: Within functional limits for tasks assessed   SENSATION: Pt reports intact in B UEs/LEs   POSTURE:  Elevated shoulders, FHP, thoracic kyphosis   GAIT: Gait pattern: WFL Assistive device utilized: Single point cane Level of assistance: SBA   FUNCTIONAL TESTS:   M-CTSIB  Condition 1: Firm Surface, EO 30 Sec, Normal and Mild Sway  Condition 2: Firm Surface, EC 30 Sec, Mild and Moderate Sway  Condition 3: Foam Surface, EO 30 Sec, Mild and Moderate Sway  Condition 4: Foam Surface, EC 30 Sec, Moderate Sway     PATIENT SURVEYS:  FOTO 39, 56  VESTIBULAR ASSESSMENT:  GENERAL OBSERVATION: pt wears distance glasses    OCULOMOTOR EXAM:  Ocular Alignment: slight L hypertropia  Ocular ROM: No Limitations  Spontaneous Nystagmus: absent  Gaze-Induced Nystagmus: absent  Smooth Pursuits: intact  Saccades: intact   VESTIBULAR - OCULAR REFLEX:   Slow VOR: Normal c/o dizziness with  horizontal>vertical   VOR Cancellation: Normal c/o wooziness   Head-Impulse Test: HIT Right: negative HIT Left: positive *c/o wooziness and throbbing in eyes       POSITIONAL TESTING:  Right Roll Test: negative Left Roll Test: negative  Right Sidelying: c/o woozy laying down and sitting up; negative  Left Sidelying: c/o woozy laying down and sitting up; negative      VESTIBULAR TREATMENT:                                                                                                   DATE:  06/10/23   PATIENT EDUCATION: Education details: prognosis, POC, HEP Person educated: Patient Education method: Explanation, Demonstration, Tactile cues, Verbal cues, and Handouts Education comprehension: verbalized understanding and returned demonstration  HOME EXERCISE PROGRAM: Access Code: [email or web address] URL: [email or web address] Date: 06/10/2023 Prepared by: [facility] - Outpatient  Rehab - [facility]  Program Notes perform standing exercises in corner for safety  Exercises - Seated Gaze Stabilization with Head Nod  - 1 x daily - 5 x weekly - 2-3 sets - 30 sec hold - Seated Gaze Stabilization with Head Rotation  - 1 x daily - 5 x weekly - 2-3 sets - 30 sec hold - Brandt-Daroff Vestibular Exercise  - 1 x daily - 5 x weekly - 2 sets - 3-5 reps - Romberg Stance with Eyes Closed  - 1 x daily - 5 x weekly - 2-3 sets - 30 sec hold   GOALS: Goals reviewed with patient? Yes  SHORT TERM GOALS: Target date: 07/01/2023  Patient to be independent with initial HEP. Baseline: HEP initiated Goal status: INITIAL    LONG TERM GOALS: Target date: 07/22/2023  Patient to be independent with advanced HEP. Baseline: Not yet initiated  Goal status: INITIAL  Patient to report 0/10 dizziness with standing vertical and horizontal VOR for 30 seconds. Baseline: Unable Goal status: INITIAL  Patient will report 0/10 dizziness with bed mobility.  Baseline: Symptomatic  Goal  status: INITIAL  Patient to demonstrate mild-moderate sway with M-CTSIB condition with eyes closed/foam surface in order to improve safety in environments with uneven surfaces and dim lighting. Baseline: moderate sway Goal status: INITIAL  Patient to score at least 20/24 on DGI in order to decrease risk of falls. Baseline: NT Goal status: INITIAL  Patient to return to silver sneakers classless with modifications as needed for safety.  Baseline: Unable Goal status: INITIAL  Patient to score at least 56 on FOTO in order to indicate improved functional outcomes.  Baseline: 39 Goal status: INITIAL    ASSESSMENT:  CLINICAL IMPRESSION:   Patient is an 88 y/o M presenting to OPPT with c/o dizziness s/p hospitalization for dizziness and HTN 05/26/23-05/27/23. Patient was active at [facility] and would like to return to [employer] classes. Patient today presenting with Imbalance with multisensory testing, slight L hypertropia, dizziness with VOR, + L HIT, and motion sensitivity with bed mobility.  Patient was educated on VOR and habituation HEP and reported understanding. Would benefit from skilled PT services 2x/week for 3 weeks followed by 1x/week for 3 weeks to address aforementioned impairments in order to optimize level of function.    OBJECTIVE IMPAIRMENTS: decreased activity tolerance, decreased balance, dizziness, and postural dysfunction.   ACTIVITY LIMITATIONS: carrying, lifting, bending, standing, squatting, stairs, transfers, bed mobility, bathing, toileting, dressing, reach over head, and hygiene/grooming  PARTICIPATION LIMITATIONS: meal prep, cleaning, laundry, driving, shopping, community activity, and church  PERSONAL FACTORS: Age, Past/current experiences, Time since onset of injury/illness/exacerbation, and 3+ comorbidities: Anemia, CKD, HTN, GERD, osteoporosis, L hip IM nailing  are also affecting patient's functional outcome.   REHAB POTENTIAL: Good  CLINICAL DECISION  MAKING: Evolving/moderate complexity  EVALUATION COMPLEXITY: Moderate   PLAN:  PT FREQUENCY: 2x/week for 3 weeks followed by 1x/week for 3 weeks  PT DURATION: 2x/week for 3 weeks followed by 1x/week for 3 weeks  PLANNED INTERVENTIONS: Therapeutic exercises, Therapeutic activity, Neuromuscular re-education, Balance training, Gait training, Patient/Family education, Self Care, Joint mobilization, Stair training, Vestibular training, Canalith repositioning, Cryotherapy, Moist heat, Taping, Manual therapy, and Re-evaluation  PLAN FOR  NEXT SESSION: DGI, review HEP, progress VOR training and habituation    Anette Guarneri, PT, DPT 06/10/23 11:35 AM  [facility] at [facility] [address] [place], [address] [place], [place] [address] Phone # [phone] Fax # [phone]    Referring diagnosis? R42 (ICD-10-CM) - Dizziness and giddiness Treatment diagnosis? (if different than referring diagnosis) Dizziness and giddiness, Unsteadiness on feet What was this (referring dx) caused by? []  Surgery []  Fall []  Ongoing issue []  Arthritis [x]  Other: ___acute onset_________  Laterality: []  Rt [x]  Lt []  Both  Check all possible CPT codes:  *CHOOSE 10 OR LESS*    []  13086 (Therapeutic Exercise)  []  92507 (SLP Treatment)  []  97112 (Neuro Re-ed)   []  92526 (Swallowing Treatment)   []  97116 (Gait Training)   []  [id number] (Cognitive Training, 1st 15 minutes) []  97140 (Manual Therapy)   []  97130 (Cognitive Training, each add'l 15 minutes)  []  97164 (Re-evaluation)                              []  Other, List CPT Code ____________  []  97530 (Therapeutic Activities)     []  97535 (Self Care)   [x]  All codes above (97110 - 97535)  []  97012 (Mechanical Traction)  []  97014 (E-stim Unattended)  []  97032 (E-stim manual)  []  97033 (Ionto)  []  97035 (Ultrasound) []  97750 (Physical Performance Training) []  [id number] (Aquatic Therapy) []  97016 (Vasopneumatic Device) []  [id number]  (Paraffin) []  97034 (Contrast Bath) []  97597 (Wound Care 1st 20 sq cm) []  97598 (Wound Care each add'l 20 sq cm) []  97760 (Orthotic Fabrication, Fitting, Training Initial) []  [id number] (Prosthetic Management and Training Initial) []  [id number] (Orthotic or Prosthetic Training/ Modification Subsequent)

## 2023-06-10 ENCOUNTER — Ambulatory Visit: Payer: Medicare HMO | Attending: Internal Medicine | Admitting: Physical Therapy

## 2023-06-10 ENCOUNTER — Other Ambulatory Visit: Payer: Self-pay

## 2023-06-10 ENCOUNTER — Encounter: Payer: Self-pay | Admitting: Physical Therapy

## 2023-06-10 DIAGNOSIS — R42 Dizziness and giddiness: Secondary | ICD-10-CM | POA: Diagnosis not present

## 2023-06-10 DIAGNOSIS — R2681 Unsteadiness on feet: Secondary | ICD-10-CM | POA: Insufficient documentation

## 2023-06-22 ENCOUNTER — Telehealth: Payer: Self-pay | Admitting: *Deleted

## 2023-06-22 ENCOUNTER — Ambulatory Visit: Payer: Medicare HMO | Attending: Internal Medicine | Admitting: Physical Therapy

## 2023-06-22 ENCOUNTER — Encounter: Payer: Self-pay | Admitting: Physical Therapy

## 2023-06-22 DIAGNOSIS — R42 Dizziness and giddiness: Secondary | ICD-10-CM | POA: Diagnosis not present

## 2023-06-22 DIAGNOSIS — I10 Essential (primary) hypertension: Secondary | ICD-10-CM

## 2023-06-22 DIAGNOSIS — G4733 Obstructive sleep apnea (adult) (pediatric): Secondary | ICD-10-CM

## 2023-06-22 DIAGNOSIS — R2681 Unsteadiness on feet: Secondary | ICD-10-CM | POA: Insufficient documentation

## 2023-06-22 DIAGNOSIS — I5032 Chronic diastolic (congestive) heart failure: Secondary | ICD-10-CM

## 2023-06-22 NOTE — Therapy (Signed)
OUTPATIENT PHYSICAL THERAPY VESTIBULAR TREATMENT NOTE     Patient Name: [name] MRN: [id number] DOB:1935/04/14, 88 y.o., male Today's Date: 06/22/2023  END OF SESSION:  PT End of Session - 06/22/23 1020     Visit Number 2    Number of Visits 10    Date for PT Re-Evaluation 07/22/23    Authorization Type [employer]    Authorization Time Period 06/10/2023-07/22/2023    Authorization - Visit Number 2    Authorization - Number of Visits 10    PT Start Time 1021    PT Stop Time 1059    PT Time Calculation (min) 38 min    Activity Tolerance Patient tolerated treatment well    Behavior During Therapy [facility] for tasks assessed/performed              Past Medical History:  Diagnosis Date   Anemia    Arthritis    osteoarthritis  back   Celiac disease    Chronic kidney disease    Collagenous colitis    Diverticulosis    Enlarged prostate    followed by dr  [name]   Esophageal dysmotility    Esophageal dysmotility    Esophageal stenosis    Essential hypertension, benign 03/29/2014   External hemorrhoids    External hemorrhoids    Full dentures    GERD (gastroesophageal reflux disease)    Hemorrhoids    Hiatal hernia    IBS (irritable bowel syndrome)    Internal hemorrhoids    OSA on CPAP 10/17/2018   Osteoporosis    Postoperative anemia due to acute blood loss 03/29/2014   Schatzki's ring    Sleep apnea    CPAP Machine   Ulcer    Past Surgical History:  Procedure Laterality Date   APPENDECTOMY  1963   CARDIOVERSION N/A 12/16/2022   Procedure: CARDIOVERSION;  Surgeon: Lewayne Bunting, MD;  Location: [facility] ENDOSCOPY;  Service: Cardiovascular;  Laterality: N/A;   ESOPHAGOGASTRODUODENOSCOPY  01/09/2022   EYE SURGERY Bilateral    cataract removal - implants   HERNIA REPAIR  2002   right inguinal    HIP SURGERY Left    INGUINAL HERNIA REPAIR  08/19/2011   Procedure: HERNIA REPAIR  left INGUINAL ADULT;  Surgeon: Adolph Pollack, MD;  Location: [facility] ORS;   Service: General;  Laterality: Left;   INTRAMEDULLARY (IM) NAIL INTERTROCHANTERIC Left 03/27/2014   Procedure: INTRAMEDULLARY (IM) NAIL INTERTROCHANTRIC;  Surgeon: Shelda Pal, MD;  Location: [facility] ORS;  Service: Orthopedics;  Laterality: Left;   RIGHT/LEFT HEART CATH AND CORONARY ANGIOGRAPHY N/A 03/12/2017   Procedure: Right/Left Heart Cath and Coronary Angiography;  Surgeon: Dolores Patty, MD;  Location: [facility] INVASIVE CV LAB;  Service: Cardiovascular;  Laterality: N/A;   TEE WITHOUT CARDIOVERSION N/A 12/16/2022   Procedure: TRANSESOPHAGEAL ECHOCARDIOGRAM (TEE);  Surgeon: Lewayne Bunting, MD;  Location: [facility] ENDOSCOPY;  Service: Cardiovascular;  Laterality: N/A;   UPPER GASTROINTESTINAL ENDOSCOPY  2008   Patient Active Problem List   Diagnosis Date Noted   Dizziness 05/26/2023   Hypokalemia 05/26/2023   Vestibular neuritis 05/26/2023   PAF (paroxysmal atrial fibrillation) (HCC) 01/21/2023   Diarrhea 05/19/2019   OSA on CPAP 10/17/2018   Essential hypertension, benign 03/29/2014   BPH (benign prostatic hyperplasia) 03/29/2014   Postoperative anemia due to acute blood loss 03/29/2014   Fracture, intertrochanteric, left femur (HCC) 03/27/2014   Hip fracture (HCC) 03/27/2014   DYSPNEA 06/05/2009   ESOPHAGEAL STRICTURE 06/04/2009   GERD 06/04/2009  PUD 06/04/2009   DEGENERATIVE DISC DISEASE 06/04/2009   MUSCLE SPASM, BACK 06/04/2009    PCP:    Chilton Greathouse, MD   REFERRING PROVIDER:    Chilton Greathouse, MD    REFERRING DIAG: R42 (ICD-10-CM) - Dizziness and giddiness  THERAPY DIAG:  Dizziness and giddiness  Unsteadiness on feet  ONSET DATE: 05/26/23  Rationale for Evaluation and Treatment: Rehabilitation  SUBJECTIVE:   SUBJECTIVE STATEMENT: Things about the same.  No falls, no pain.  Haven't had a chance to do the exercises yet.    Pt accompanied by: self  PERTINENT HISTORY: Anemia, CKD, HTN, GERD, osteoporosis, L hip IM nailing  PAIN:  Are you having  pain? No  PRECAUTIONS: Fall; currently wearing heart monitor   RED FLAGS: None   WEIGHT BEARING RESTRICTIONS: No  FALLS: Has patient fallen in last 6 months? No  LIVING ENVIRONMENT: Lives with: lives with their spouse Lives in: House/apartment Stairs:  3 steps to enter with handrail; 1 story Has following equipment at home: Single point cane, Environmental consultant - 2 wheeled, shower chair, and Grab bars  PLOF: Independent, retired  PATIENT GOALS: return to [employer] at [employer]   OBJECTIVE:    TODAY'S TREATMENT: 06/22/2023 Activity Comments  DGI 20/24  Review of HEP: -seated gaze stabilization vertical 2 x 30sec -seated gaze stabilization horizontal 2 x 30 -Brandt-Daroff R and L  -Romberg EC 30 sec in corner  -1/10 blurry symptoms  -1/10 dizziness symptoms  -c/o minor wooziness in midline coming up from L  Performed feet apart EC head turns/nods x 5 reps Mild sway  Romberg stance EC head turns/nods x 5 reps Mod sway, reports unsteadiness  Standing VOR horizontal x 5 Standing VOR vertical x 5 Rates 2/10 symptoms  Seated diagonals, looking down to L knee x 5, to center; looking down to R knee to center, x 5 No c/o to L  Rates as 2/10 to R      [facility] PT Assessment - 06/22/23 0001       Standardized Balance Assessment   Standardized Balance Assessment Dynamic Gait Index      Dynamic Gait Index   Level Surface Mild Impairment   8.44 sec in 20 ft   Change in Gait Speed Normal    Gait with Horizontal Head Turns Mild Impairment   10.4 sec   Gait with Vertical Head Turns Mild Impairment   9.4 sec   Gait and Pivot Turn Normal    Step Over Obstacle Mild Impairment    Step Around Obstacles Normal    Steps Normal    Total Score 20    DGI comment: Scores <19/24 indicate increased fall risk.             HOME EXERCISE PROGRAM: Access Code: [email or web address] URL: [email or web address] Date: 06/22/2023 Prepared by: [facility] - Outpatient  Rehab - [facility]  Program Notes perform standing exercises in corner for safety  Exercises - Brandt-Daroff Vestibular Exercise  - 1 x daily - 5 x weekly - 2 sets - 3-5 reps - Romberg Stance with Eyes Closed  - 1 x daily - 5 x weekly - 2-3 sets - 30 sec hold - Standing Gaze Stabilization with Head Rotation  - 1-2 x daily - 7 x weekly - 1 sets - 3 reps - 30 sec hold - Standing Gaze Stabilization with Head Nod  - 1-2 x daily - 7 x weekly - 1 sets - 3 reps - 30 sec hold  PATIENT  EDUCATION: Education details: HEP additions, rationale for habituation exercises and importance of consistent performance of HEP Person educated: Patient Education method: Explanation, Demonstration, Verbal cues, and Handouts Education comprehension: verbalized understanding and returned demonstration  ---------------------------------------------------------- Objective measures below taken at initial evaluation:  DIAGNOSTIC FINDINGS: 05/26/23 brain MRI: No acute intracranial pathology. Pansinusitis with layering fluid which could reflect acute sinusitis in the correct clinical setting. Trace right mastoid effusion.  COGNITION: Overall cognitive status: Within functional limits for tasks assessed   SENSATION: Pt reports intact in B UEs/LEs   POSTURE:  Elevated shoulders, FHP, thoracic kyphosis   GAIT: Gait pattern: WFL Assistive device utilized: Single point cane Level of assistance: SBA   FUNCTIONAL TESTS:   M-CTSIB  Condition 1: Firm Surface, EO 30 Sec, Normal and Mild Sway  Condition 2: Firm Surface, EC 30 Sec, Mild and Moderate Sway  Condition 3: Foam Surface, EO 30 Sec, Mild and Moderate Sway  Condition 4: Foam Surface, EC 30 Sec, Moderate Sway     PATIENT SURVEYS:  FOTO 39, 56  VESTIBULAR ASSESSMENT:  GENERAL OBSERVATION: pt wears distance glasses    OCULOMOTOR EXAM:  Ocular Alignment: slight L hypertropia  Ocular ROM: No Limitations  Spontaneous Nystagmus: absent  Gaze-Induced Nystagmus:  absent  Smooth Pursuits: intact  Saccades: intact   VESTIBULAR - OCULAR REFLEX:   Slow VOR: Normal c/o dizziness with horizontal>vertical   VOR Cancellation: Normal c/o wooziness   Head-Impulse Test: HIT Right: negative HIT Left: positive *c/o wooziness and throbbing in eyes       POSITIONAL TESTING:  Right Roll Test: negative Left Roll Test: negative  Right Sidelying: c/o woozy laying down and sitting up; negative  Left Sidelying: c/o woozy laying down and sitting up; negative      VESTIBULAR TREATMENT:                                                                                                   DATE: 06/10/23   PATIENT EDUCATION: Education details: prognosis, POC, HEP Person educated: Patient Education method: Explanation, Demonstration, Tactile cues, Verbal cues, and Handouts Education comprehension: verbalized understanding and returned demonstration  HOME EXERCISE PROGRAM: Access Code: [email or web address] URL: [email or web address] Date: 06/10/2023 Prepared by: [facility] - Outpatient  Rehab - [facility]  Program Notes perform standing exercises in corner for safety  Exercises - Seated Gaze Stabilization with Head Nod  - 1 x daily - 5 x weekly - 2-3 sets - 30 sec hold - Seated Gaze Stabilization with Head Rotation  - 1 x daily - 5 x weekly - 2-3 sets - 30 sec hold - Brandt-Daroff Vestibular Exercise  - 1 x daily - 5 x weekly - 2 sets - 3-5 reps - Romberg Stance with Eyes Closed  - 1 x daily - 5 x weekly - 2-3 sets - 30 sec hold   GOALS: Goals reviewed with patient? Yes  SHORT TERM GOALS: Target date: 07/01/2023  Patient to be independent with initial HEP. Baseline: HEP initiated Goal status: IN PROGRESS    LONG TERM GOALS: Target date: 07/22/2023  Patient to be independent with  advanced HEP. Baseline: Not yet initiated  Goal status: IN PROGRESS  Patient to report 0/10 dizziness with standing vertical and horizontal VOR for 30  seconds. Baseline: Unable Goal status: IN PROGRESS  Patient will report 0/10 dizziness with bed mobility.  Baseline: Symptomatic  Goal status: IN PROGRESS  Patient to demonstrate mild-moderate sway with M-CTSIB condition with eyes closed/foam surface in order to improve safety in environments with uneven surfaces and dim lighting. Baseline: moderate sway Goal status: IN PROGRESS  Patient to score at least 20/24 on DGI in order to decrease risk of falls. Baseline: 20/24 at baseline 06/22/2023 Goal status: MET  Patient to return to silver sneakers classless with modifications as needed for safety.  Baseline: Unable Goal status: IN PROGRESS  Patient to score at least 56 on FOTO in order to indicate improved functional outcomes.  Baseline: 39 Goal status: IN PROGRESS    ASSESSMENT:  CLINICAL IMPRESSION: Skilled PT session focused on review of HEP and progression of habituation exercises.  Also, assessed DGI, without use of cane, and pt's score of 20/24 indicates low risk of falls.  Pt reports that he hasn't performed HEP yet, but we did progress VOR to standing position, as the seated position only brought on 1/10 symptoms; standing brought on 2/10.  Pt will continue to benefit from skilled PT towards goals for improved functional mobility and balance.  OBJECTIVE IMPAIRMENTS: decreased activity tolerance, decreased balance, dizziness, and postural dysfunction.   ACTIVITY LIMITATIONS: carrying, lifting, bending, standing, squatting, stairs, transfers, bed mobility, bathing, toileting, dressing, reach over head, and hygiene/grooming  PARTICIPATION LIMITATIONS: meal prep, cleaning, laundry, driving, shopping, community activity, and church  PERSONAL FACTORS: Age, Past/current experiences, Time since onset of injury/illness/exacerbation, and 3+ comorbidities: Anemia, CKD, HTN, GERD, osteoporosis, L hip IM nailing  are also affecting patient's functional outcome.   REHAB POTENTIAL:  Good  CLINICAL DECISION MAKING: Evolving/moderate complexity  EVALUATION COMPLEXITY: Moderate   PLAN:  PT FREQUENCY: 2x/week for 3 weeks followed by 1x/week for 3 weeks  PT DURATION: 2x/week for 3 weeks followed by 1x/week for 3 weeks  PLANNED INTERVENTIONS: Therapeutic exercises, Therapeutic activity, Neuromuscular re-education, Balance training, Gait training, Patient/Family education, Self Care, Joint mobilization, Stair training, Vestibular training, Canalith repositioning, Cryotherapy, Moist heat, Taping, Manual therapy, and Re-evaluation  PLAN FOR NEXT SESSION:  Review HEP, progress VOR training and habituation, balance exercises in corner/on foam   Lonia Blood, PT 06/22/23 11:00 AM Phone: [phone] Fax: [phone]   [facility] at [facility] [address] [place], [place] [address] Phone # [phone] Fax # [phone]

## 2023-06-22 NOTE — Telephone Encounter (Signed)
Order placed to [facility] via community message. 

## 2023-06-22 NOTE — Telephone Encounter (Signed)
Per Dr Mayford Knife, Order new ResMed CPAP at [facility] with heated humidity and mask of choice -he will need followup in 6 weeks after getting his new device

## 2023-06-22 NOTE — Telephone Encounter (Signed)
Per Dr Mayford Knife, Order new ResMed CPAP at [facility] with heated humidity and mask of choice   Cpap has been ordered and sent to [facility]

## 2023-06-24 ENCOUNTER — Ambulatory Visit: Payer: Medicare HMO | Admitting: Physical Therapy

## 2023-06-24 ENCOUNTER — Telehealth (HOSPITAL_COMMUNITY): Payer: Self-pay | Admitting: Cardiology

## 2023-06-24 ENCOUNTER — Encounter: Payer: Self-pay | Admitting: Physical Therapy

## 2023-06-24 DIAGNOSIS — N400 Enlarged prostate without lower urinary tract symptoms: Secondary | ICD-10-CM | POA: Diagnosis not present

## 2023-06-24 DIAGNOSIS — R42 Dizziness and giddiness: Secondary | ICD-10-CM

## 2023-06-24 DIAGNOSIS — R5383 Other fatigue: Secondary | ICD-10-CM | POA: Diagnosis not present

## 2023-06-24 DIAGNOSIS — F0390 Unspecified dementia without behavioral disturbance: Secondary | ICD-10-CM | POA: Diagnosis not present

## 2023-06-24 DIAGNOSIS — G4733 Obstructive sleep apnea (adult) (pediatric): Secondary | ICD-10-CM | POA: Diagnosis not present

## 2023-06-24 DIAGNOSIS — D649 Anemia, unspecified: Secondary | ICD-10-CM | POA: Diagnosis not present

## 2023-06-24 DIAGNOSIS — I129 Hypertensive chronic kidney disease with stage 1 through stage 4 chronic kidney disease, or unspecified chronic kidney disease: Secondary | ICD-10-CM | POA: Diagnosis not present

## 2023-06-24 DIAGNOSIS — R2681 Unsteadiness on feet: Secondary | ICD-10-CM | POA: Diagnosis not present

## 2023-06-24 NOTE — Telephone Encounter (Signed)
Patient daughter called to request office visit with provider to review medications/symptoms. Daughter was unable to come at last appointment and family has concerns with increase in weakness and fatigue   Follow up made 10/8

## 2023-06-24 NOTE — Therapy (Signed)
OUTPATIENT PHYSICAL THERAPY VESTIBULAR TREATMENT NOTE     Patient Name: [name] MRN: [id number] DOB:[birth year], 88 y.o., male Today's Date: 06/24/2023  END OF SESSION:  PT End of Session - 06/24/23 1024     Visit Number 3    Number of Visits 10    Date for PT Re-Evaluation 07/22/23    Authorization Type [employer]    Authorization Time Period 06/10/2023-07/22/2023    Authorization - Visit Number 3    Authorization - Number of Visits 10    PT Start Time 1020    PT Stop Time 1052    PT Time Calculation (min) 32 min    Activity Tolerance Patient tolerated treatment well    Behavior During Therapy [facility] for tasks assessed/performed               Past Medical History:  Diagnosis Date   Anemia    Arthritis    osteoarthritis  back   Celiac disease    Chronic kidney disease    Collagenous colitis    Diverticulosis    Enlarged prostate    followed by dr  [name]   Esophageal dysmotility    Esophageal dysmotility    Esophageal stenosis    Essential hypertension, benign 03/29/2014   External hemorrhoids    External hemorrhoids    Full dentures    GERD (gastroesophageal reflux disease)    Hemorrhoids    Hiatal hernia    IBS (irritable bowel syndrome)    Internal hemorrhoids    OSA on CPAP 10/17/2018   Osteoporosis    Postoperative anemia due to acute blood loss 03/29/2014   Schatzki's ring    Sleep apnea    CPAP Machine   Ulcer    Past Surgical History:  Procedure Laterality Date   APPENDECTOMY  1963   CARDIOVERSION N/A 12/16/2022   Procedure: CARDIOVERSION;  Surgeon: Lewayne Bunting, MD;  Location: [facility] ENDOSCOPY;  Service: Cardiovascular;  Laterality: N/A;   ESOPHAGOGASTRODUODENOSCOPY  01/09/2022   EYE SURGERY Bilateral    cataract removal - implants   HERNIA REPAIR  2002   right inguinal    HIP SURGERY Left    INGUINAL HERNIA REPAIR  08/19/2011   Procedure: HERNIA REPAIR  left INGUINAL ADULT;  Surgeon: Adolph Pollack, MD;  Location: [facility] ORS;   Service: General;  Laterality: Left;   INTRAMEDULLARY (IM) NAIL INTERTROCHANTERIC Left 03/27/2014   Procedure: INTRAMEDULLARY (IM) NAIL INTERTROCHANTRIC;  Surgeon: Shelda Pal, MD;  Location: [facility] ORS;  Service: Orthopedics;  Laterality: Left;   RIGHT/LEFT HEART CATH AND CORONARY ANGIOGRAPHY N/A 03/12/2017   Procedure: Right/Left Heart Cath and Coronary Angiography;  Surgeon: Dolores Patty, MD;  Location: [facility] INVASIVE CV LAB;  Service: Cardiovascular;  Laterality: N/A;   TEE WITHOUT CARDIOVERSION N/A 12/16/2022   Procedure: TRANSESOPHAGEAL ECHOCARDIOGRAM (TEE);  Surgeon: Lewayne Bunting, MD;  Location: [facility] ENDOSCOPY;  Service: Cardiovascular;  Laterality: N/A;   UPPER GASTROINTESTINAL ENDOSCOPY  2008   Patient Active Problem List   Diagnosis Date Noted   Dizziness 05/26/2023   Hypokalemia 05/26/2023   Vestibular neuritis 05/26/2023   PAF (paroxysmal atrial fibrillation) (HCC) 01/21/2023   Diarrhea 05/19/2019   OSA on CPAP 10/17/2018   Essential hypertension, benign 03/29/2014   BPH (benign prostatic hyperplasia) 03/29/2014   Postoperative anemia due to acute blood loss 03/29/2014   Fracture, intertrochanteric, left femur (HCC) 03/27/2014   Hip fracture (HCC) 03/27/2014   DYSPNEA 06/05/2009   ESOPHAGEAL STRICTURE 06/04/2009   GERD 06/04/2009  PUD 06/04/2009   DEGENERATIVE DISC DISEASE 06/04/2009   MUSCLE SPASM, BACK 06/04/2009    PCP:    Chilton Greathouse, MD   REFERRING PROVIDER:    Chilton Greathouse, MD    REFERRING DIAG: R42 (ICD-10-CM) - Dizziness and giddiness  THERAPY DIAG:  Dizziness and giddiness  Unsteadiness on feet  ONSET DATE: 05/26/23  Rationale for Evaluation and Treatment: Rehabilitation  SUBJECTIVE:   SUBJECTIVE STATEMENT: Tried to do the exercises -(gaze stabilization standing) and I couldn't do it, as it was hurting. Hurting (up to 10/10) pain, behind eyes. Was wearing my new glasses when I was doing this.  Daughter has made MD appt and  ophthalmologist appointment.  Pt accompanied by: self, daughter  PERTINENT HISTORY: Anemia, CKD, HTN, GERD, osteoporosis, L hip IM nailing  PAIN:  Are you having pain? No  PRECAUTIONS: Fall; currently wearing heart monitor   RED FLAGS: None   WEIGHT BEARING RESTRICTIONS: No  FALLS: Has patient fallen in last 6 months? No  LIVING ENVIRONMENT: Lives with: lives with their spouse Lives in: House/apartment Stairs:  3 steps to enter with handrail; 1 story Has following equipment at home: Single point cane, Environmental consultant - 2 wheeled, shower chair, and Grab bars  PLOF: Independent, retired  PATIENT GOALS: return to [employer] at [employer]   OBJECTIVE:    TODAY'S TREATMENT: 06/24/2023 Activity Comments  Seated gaze stabilization-horizontal and vertical No c/o, no symptoms  Standing gaze stabilization, vertical and horizontal Rates 3/10 dizziness, which subsides  Repeated standing gaze stabilization vertical and horizontal with target at cabinet, light UE support 3/10 dizziness, very light headache  Convergence:  10 inches   Pencil push-ups, 3 reps  sitting  Vitals 139/74, HR 75 bpm    HOME EXERCISE PROGRAM: Access Code: [email or web address] URL: [email or web address] Date: 06/24/2023 Prepared by: [facility] - Outpatient  Rehab - [facility]  Program Notes perform standing exercises in corner for safety; perform standing gaze stabilization at counter with letter target  Exercises - Brandt-Daroff Vestibular Exercise  - 1 x daily - 5 x weekly - 2 sets - 3-5 reps - Romberg Stance with Eyes Closed  - 1 x daily - 5 x weekly - 2-3 sets - 30 sec hold - Standing Gaze Stabilization with Head Rotation  - 1-2 x daily - 7 x weekly - 1 sets - 3 reps - 30 sec hold - Standing Gaze Stabilization with Head Nod  - 1-2 x daily - 7 x weekly - 1 sets - 3 reps - 30 sec hold - Pencil Pushups  - 1-2 x daily - 7 x weekly - 1 sets - 3-5 reps    PATIENT EDUCATION: Education details: HEP  additions/modifications-hold to counter and use letter target at counter (versus using his thumb as target); reviewed rationale for habituation exercises and to bring symptoms on no greater than 3-4/10 Person educated: Patient and Child(ren) Education method: Explanation, Demonstration, Verbal cues, and Handouts Education comprehension: verbalized understanding and returned demonstration  ---------------------------------------------------------- Objective measures below taken at initial evaluation:  DIAGNOSTIC FINDINGS: 05/26/23 brain MRI: No acute intracranial pathology. Pansinusitis with layering fluid which could reflect acute sinusitis in the correct clinical setting. Trace right mastoid effusion.  COGNITION: Overall cognitive status: Within functional limits for tasks assessed   SENSATION: Pt reports intact in B UEs/LEs   POSTURE:  Elevated shoulders, FHP, thoracic kyphosis   GAIT: Gait pattern: WFL Assistive device utilized: Single point cane Level of assistance: SBA   FUNCTIONAL TESTS:   M-CTSIB  Condition 1: Firm Surface, EO 30 Sec, Normal and Mild Sway  Condition 2: Firm Surface, EC 30 Sec, Mild and Moderate Sway  Condition 3: Foam Surface, EO 30 Sec, Mild and Moderate Sway  Condition 4: Foam Surface, EC 30 Sec, Moderate Sway     PATIENT SURVEYS:  FOTO 39, 56  VESTIBULAR ASSESSMENT:  GENERAL OBSERVATION: pt wears distance glasses    OCULOMOTOR EXAM:  Ocular Alignment: slight L hypertropia  Ocular ROM: No Limitations  Spontaneous Nystagmus: absent  Gaze-Induced Nystagmus: absent  Smooth Pursuits: intact  Saccades: intact   VESTIBULAR - OCULAR REFLEX:   Slow VOR: Normal c/o dizziness with horizontal>vertical   VOR Cancellation: Normal c/o wooziness   Head-Impulse Test: HIT Right: negative HIT Left: positive *c/o wooziness and throbbing in eyes       POSITIONAL TESTING:  Right Roll Test: negative Left Roll Test: negative  Right Sidelying: c/o woozy  laying down and sitting up; negative  Left Sidelying: c/o woozy laying down and sitting up; negative      VESTIBULAR TREATMENT:                                                                                                   DATE: 06/10/23   PATIENT EDUCATION: Education details: prognosis, POC, HEP Person educated: Patient Education method: Explanation, Demonstration, Tactile cues, Verbal cues, and Handouts Education comprehension: verbalized understanding and returned demonstration  HOME EXERCISE PROGRAM: Access Code: [email or web address] URL: [email or web address] Date: 06/10/2023 Prepared by: [facility] - Outpatient  Rehab - [facility]  Program Notes perform standing exercises in corner for safety  Exercises - Seated Gaze Stabilization with Head Nod  - 1 x daily - 5 x weekly - 2-3 sets - 30 sec hold - Seated Gaze Stabilization with Head Rotation  - 1 x daily - 5 x weekly - 2-3 sets - 30 sec hold - Brandt-Daroff Vestibular Exercise  - 1 x daily - 5 x weekly - 2 sets - 3-5 reps - Romberg Stance with Eyes Closed  - 1 x daily - 5 x weekly - 2-3 sets - 30 sec hold   GOALS: Goals reviewed with patient? Yes  SHORT TERM GOALS: Target date: 07/01/2023  Patient to be independent with initial HEP. Baseline: HEP initiated Goal status: IN PROGRESS    LONG TERM GOALS: Target date: 07/22/2023  Patient to be independent with advanced HEP. Baseline: Not yet initiated  Goal status: IN PROGRESS  Patient to report 0/10 dizziness with standing vertical and horizontal VOR for 30 seconds. Baseline: Unable Goal status: IN PROGRESS  Patient will report 0/10 dizziness with bed mobility.  Baseline: Symptomatic  Goal status: IN PROGRESS  Patient to demonstrate mild-moderate sway with M-CTSIB condition with eyes closed/foam surface in order to improve safety in environments with uneven surfaces and dim lighting. Baseline: moderate sway Goal status: IN PROGRESS  Patient to  score at least 20/24 on DGI in order to decrease risk of falls. Baseline: 20/24 at baseline 06/22/2023 Goal status: MET  Patient to return to silver sneakers classless with modifications as needed for safety.  Baseline: Unable  Goal status: IN PROGRESS  Patient to score at least 56 on FOTO in order to indicate improved functional outcomes.  Baseline: 39 Goal status: IN PROGRESS    ASSESSMENT:  CLINICAL IMPRESSION: Pt arrives to therapy session with daughter today and reports that performing standing VOR at home (with his new glasses) caused him 10/10 headache pain behind his eyes.  Discussed eval oculomotor and VOR findings and what we would typically expect with this type of habituation exercises-typically more of the dizziness, not a significant headache.  Did assess convergence today, with target becoming double at approximately 10 inches, which is more than normal.  Added pencil push-ups to HEP to address.  Pt and daughter they are concerned about pt's headache and ongoing fatigue and he is planning to see ophthalmologist and MD later today.  He opts to end session early in preparation to get to these appointments.    OBJECTIVE IMPAIRMENTS: decreased activity tolerance, decreased balance, dizziness, and postural dysfunction.   ACTIVITY LIMITATIONS: carrying, lifting, bending, standing, squatting, stairs, transfers, bed mobility, bathing, toileting, dressing, reach over head, and hygiene/grooming  PARTICIPATION LIMITATIONS: meal prep, cleaning, laundry, driving, shopping, community activity, and church  PERSONAL FACTORS: Age, Past/current experiences, Time since onset of injury/illness/exacerbation, and 3+ comorbidities: Anemia, CKD, HTN, GERD, osteoporosis, L hip IM nailing  are also affecting patient's functional outcome.   REHAB POTENTIAL: Good  CLINICAL DECISION MAKING: Evolving/moderate complexity  EVALUATION COMPLEXITY: Moderate   PLAN:  PT FREQUENCY: 2x/week for 3 weeks  followed by 1x/week for 3 weeks  PT DURATION: 2x/week for 3 weeks followed by 1x/week for 3 weeks  PLANNED INTERVENTIONS: Therapeutic exercises, Therapeutic activity, Neuromuscular re-education, Balance training, Gait training, Patient/Family education, Self Care, Joint mobilization, Stair training, Vestibular training, Canalith repositioning, Cryotherapy, Moist heat, Taping, Manual therapy, and Re-evaluation  PLAN FOR NEXT SESSION:  Review updates to HEP, progress VOR training and habituation, balance exercises in corner/on foam   Lonia Blood, PT 06/24/23 10:59 AM Phone: [phone] Fax: [phone]   [facility] at [facility] [address], [address] [place], [place] [address] Phone # [phone] Fax # [phone]

## 2023-06-29 ENCOUNTER — Encounter: Payer: Self-pay | Admitting: Physical Therapy

## 2023-06-29 ENCOUNTER — Ambulatory Visit: Payer: Medicare HMO | Admitting: Physical Therapy

## 2023-06-29 DIAGNOSIS — R42 Dizziness and giddiness: Secondary | ICD-10-CM

## 2023-06-29 DIAGNOSIS — R2681 Unsteadiness on feet: Secondary | ICD-10-CM

## 2023-06-29 NOTE — Therapy (Signed)
OUTPATIENT PHYSICAL THERAPY VESTIBULAR TREATMENT NOTE     Patient Name: [name] MRN: [id number] DOB:[birth year], 88 y.o., male Today's Date: 06/29/2023  END OF SESSION:  PT End of Session - 06/29/23 1019     Visit Number 4    Number of Visits 10    Date for PT Re-Evaluation 07/22/23    Authorization Type [employer]    Authorization Time Period 06/10/2023-07/22/2023    Authorization - Visit Number 4    Authorization - Number of Visits 10    PT Start Time 1020    PT Stop Time 1058    PT Time Calculation (min) 38 min    Activity Tolerance Patient tolerated treatment well    Behavior During Therapy [facility] for tasks assessed/performed                Past Medical History:  Diagnosis Date   Anemia    Arthritis    osteoarthritis  back   Celiac disease    Chronic kidney disease    Collagenous colitis    Diverticulosis    Enlarged prostate    followed by dr  [name]   Esophageal dysmotility    Esophageal dysmotility    Esophageal stenosis    Essential hypertension, benign 03/29/2014   External hemorrhoids    External hemorrhoids    Full dentures    GERD (gastroesophageal reflux disease)    Hemorrhoids    Hiatal hernia    IBS (irritable bowel syndrome)    Internal hemorrhoids    OSA on CPAP 10/17/2018   Osteoporosis    Postoperative anemia due to acute blood loss 03/29/2014   Schatzki's ring    Sleep apnea    CPAP Machine   Ulcer    Past Surgical History:  Procedure Laterality Date   APPENDECTOMY  1963   CARDIOVERSION N/A 12/16/2022   Procedure: CARDIOVERSION;  Surgeon: Lewayne Bunting, MD;  Location: [facility] ENDOSCOPY;  Service: Cardiovascular;  Laterality: N/A;   ESOPHAGOGASTRODUODENOSCOPY  01/09/2022   EYE SURGERY Bilateral    cataract removal - implants   HERNIA REPAIR  2002   right inguinal    HIP SURGERY Left    INGUINAL HERNIA REPAIR  08/19/2011   Procedure: HERNIA REPAIR  left INGUINAL ADULT;  Surgeon: Adolph Pollack, MD;  Location: [facility] ORS;   Service: General;  Laterality: Left;   INTRAMEDULLARY (IM) NAIL INTERTROCHANTERIC Left 03/27/2014   Procedure: INTRAMEDULLARY (IM) NAIL INTERTROCHANTRIC;  Surgeon: Shelda Pal, MD;  Location: [facility] ORS;  Service: Orthopedics;  Laterality: Left;   RIGHT/LEFT HEART CATH AND CORONARY ANGIOGRAPHY N/A 03/12/2017   Procedure: Right/Left Heart Cath and Coronary Angiography;  Surgeon: Dolores Patty, MD;  Location: [facility] INVASIVE CV LAB;  Service: Cardiovascular;  Laterality: N/A;   TEE WITHOUT CARDIOVERSION N/A 12/16/2022   Procedure: TRANSESOPHAGEAL ECHOCARDIOGRAM (TEE);  Surgeon: Lewayne Bunting, MD;  Location: [facility] ENDOSCOPY;  Service: Cardiovascular;  Laterality: N/A;   UPPER GASTROINTESTINAL ENDOSCOPY  2008   Patient Active Problem List   Diagnosis Date Noted   Dizziness 05/26/2023   Hypokalemia 05/26/2023   Vestibular neuritis 05/26/2023   PAF (paroxysmal atrial fibrillation) (HCC) 01/21/2023   Diarrhea 05/19/2019   OSA on CPAP 10/17/2018   Essential hypertension, benign 03/29/2014   BPH (benign prostatic hyperplasia) 03/29/2014   Postoperative anemia due to acute blood loss 03/29/2014   Fracture, intertrochanteric, left femur (HCC) 03/27/2014   Hip fracture (HCC) 03/27/2014   DYSPNEA 06/05/2009   ESOPHAGEAL STRICTURE 06/04/2009   GERD  06/04/2009   PUD 06/04/2009   DEGENERATIVE DISC DISEASE 06/04/2009   MUSCLE SPASM, BACK 06/04/2009    PCP:    Chilton Greathouse, MD   REFERRING PROVIDER:    Chilton Greathouse, MD    REFERRING DIAG: R42 (ICD-10-CM) - Dizziness and giddiness  THERAPY DIAG:  Dizziness and giddiness  Unsteadiness on feet  ONSET DATE: 05/26/23  Rationale for Evaluation and Treatment: Rehabilitation  SUBJECTIVE:   SUBJECTIVE STATEMENT: Saw the eye doctor and they said I should do eye drops at least twice per day.  I'll go back and she will fix the bifocals. (They were too low and too strong).  Also saw my doctor and he told me to stop the Aricept medication.   I feel much better.  Pt accompanied by: self, daughter  PERTINENT HISTORY: Anemia, CKD, HTN, GERD, osteoporosis, L hip IM nailing  PAIN:  Are you having pain? No  PRECAUTIONS: Fall; currently wearing heart monitor   RED FLAGS: None   WEIGHT BEARING RESTRICTIONS: No  FALLS: Has patient fallen in last 6 months? No  LIVING ENVIRONMENT: Lives with: lives with their spouse Lives in: House/apartment Stairs:  3 steps to enter with handrail; 1 story Has following equipment at home: Single point cane, Environmental consultant - 2 wheeled, shower chair, and Grab bars  PLOF: Independent, retired  PATIENT GOALS: return to [employer] at [employer]   OBJECTIVE:    TODAY'S TREATMENT: 06/29/2023 Activity Comments  Reviewed pencil push-ups and pt return demo understanding   Sidestepping R and L x 2 minutes Forward/back walking x 2 min Intermittent UE support in parallel bars  Alt step taps 2 x 10 reps to 6" steps 2nd set standing on Airex  On Airex: -feet apart EO/EC head turns/nods 5 reps, min sway and no UE support  At parallel bars: Tandem gait forward/back, 3 reps Tandem march forward/back, 3 reps 1 UE support  Standing gaze stabilization  -feet apart, feet together-horizontal and vertical Mild unsteadiness with feet together, no dizziness symptoms  Gait with head turns 25 ft x 3 reps Gait with head nods 25 ft x 3        HOME EXERCISE PROGRAM: Access Code: [email or web address] URL: [email or web address] Date: 06/24/2023 Prepared by: [facility] - Outpatient  Rehab - [facility]  Program Notes perform standing exercises in corner for safety; perform standing gaze stabilization at counter with letter target  Exercises - Brandt-Daroff Vestibular Exercise  - 1 x daily - 5 x weekly - 2 sets - 3-5 reps - Romberg Stance with Eyes Closed  - 1 x daily - 5 x weekly - 2-3 sets - 30 sec hold - Standing Gaze Stabilization with Head Rotation  - 1-2 x daily - 7 x weekly - 1 sets - 3 reps -  30 sec hold - Standing Gaze Stabilization with Head Nod  - 1-2 x daily - 7 x weekly - 1 sets - 3 reps - 30 sec hold - Pencil Pushups  - 1-2 x daily - 7 x weekly - 1 sets - 3-5 reps    PATIENT EDUCATION: Education details: HEP additions/modifications-hold to counter and use letter target at counter (versus using his thumb as target); reviewed rationale for habituation exercises and to bring symptoms on no greater than 3-4/10 Person educated: Patient and Child(ren) Education method: Explanation, Demonstration, Verbal cues, and Handouts Education comprehension: verbalized understanding and returned demonstration  ---------------------------------------------------------- Objective measures below taken at initial evaluation:  DIAGNOSTIC FINDINGS: 05/26/23 brain MRI: No acute intracranial pathology.  Pansinusitis with layering fluid which could reflect acute sinusitis in the correct clinical setting. Trace right mastoid effusion.  COGNITION: Overall cognitive status: Within functional limits for tasks assessed   SENSATION: Pt reports intact in B UEs/LEs   POSTURE:  Elevated shoulders, FHP, thoracic kyphosis   GAIT: Gait pattern: WFL Assistive device utilized: Single point cane Level of assistance: SBA   FUNCTIONAL TESTS:   M-CTSIB  Condition 1: Firm Surface, EO 30 Sec, Normal and Mild Sway  Condition 2: Firm Surface, EC 30 Sec, Mild and Moderate Sway  Condition 3: Foam Surface, EO 30 Sec, Mild and Moderate Sway  Condition 4: Foam Surface, EC 30 Sec, Moderate Sway     PATIENT SURVEYS:  FOTO 39, 56  VESTIBULAR ASSESSMENT:  GENERAL OBSERVATION: pt wears distance glasses    OCULOMOTOR EXAM:  Ocular Alignment: slight L hypertropia  Ocular ROM: No Limitations  Spontaneous Nystagmus: absent  Gaze-Induced Nystagmus: absent  Smooth Pursuits: intact  Saccades: intact   VESTIBULAR - OCULAR REFLEX:   Slow VOR: Normal c/o dizziness with horizontal>vertical   VOR Cancellation:  Normal c/o wooziness   Head-Impulse Test: HIT Right: negative HIT Left: positive *c/o wooziness and throbbing in eyes       POSITIONAL TESTING:  Right Roll Test: negative Left Roll Test: negative  Right Sidelying: c/o woozy laying down and sitting up; negative  Left Sidelying: c/o woozy laying down and sitting up; negative      VESTIBULAR TREATMENT:                                                                                                   DATE: 06/10/23   PATIENT EDUCATION: Education details: prognosis, POC, HEP Person educated: Patient Education method: Explanation, Demonstration, Tactile cues, Verbal cues, and Handouts Education comprehension: verbalized understanding and returned demonstration  HOME EXERCISE PROGRAM: Access Code: [email or web address] URL: [email or web address] Date: 06/10/2023 Prepared by: [facility] - Outpatient  Rehab - [facility]  Program Notes perform standing exercises in corner for safety  Exercises - Seated Gaze Stabilization with Head Nod  - 1 x daily - 5 x weekly - 2-3 sets - 30 sec hold - Seated Gaze Stabilization with Head Rotation  - 1 x daily - 5 x weekly - 2-3 sets - 30 sec hold - Brandt-Daroff Vestibular Exercise  - 1 x daily - 5 x weekly - 2 sets - 3-5 reps - Romberg Stance with Eyes Closed  - 1 x daily - 5 x weekly - 2-3 sets - 30 sec hold   GOALS: Goals reviewed with patient? Yes  SHORT TERM GOALS: Target date: 07/01/2023  Patient to be independent with initial HEP. Baseline: HEP initiated and met Goal status: MET 06/29/2023    LONG TERM GOALS: Target date: 07/22/2023  Patient to be independent with advanced HEP. Baseline: Not yet initiated  Goal status: IN PROGRESS  Patient to report 0/10 dizziness with standing vertical and horizontal VOR for 30 seconds. Baseline: Unable Goal status: IN PROGRESS  Patient will report 0/10 dizziness with bed mobility.  Baseline: Symptomatic  Goal status: IN  PROGRESS  Patient to demonstrate mild-moderate sway with M-CTSIB condition with eyes closed/foam surface in order to improve safety in environments with uneven surfaces and dim lighting. Baseline: moderate sway Goal status: IN PROGRESS  Patient to score at least 20/24 on DGI in order to decrease risk of falls. Baseline: 20/24 at baseline 06/22/2023 Goal status: MET  Patient to return to silver sneakers classless with modifications as needed for safety.  Baseline: Unable Goal status: IN PROGRESS  Patient to score at least 56 on FOTO in order to indicate improved functional outcomes.  Baseline: 39 Goal status: IN PROGRESS    ASSESSMENT:  CLINICAL IMPRESSION: Pt arrives today reporting he is feeling much better.  He went to the MD and he was taken off Aricept.  Pt reports he is not having any dizziness and with VOR exercises and balance activities in session today, pt does not report any dizziness.  He does have mild unsteadiness with narrowed BOS and on Airex, needing intermittent UE support.  Pt reports he will plan to return to Silver [employer]neakers class (at [employer]) this week and will plan to come back in for PT next week.  If pt is continueing to do well with no dizziness and better balance, may consider checking goals/upgrading HEP as needed.    OBJECTIVE IMPAIRMENTS: decreased activity tolerance, decreased balance, dizziness, and postural dysfunction.   ACTIVITY LIMITATIONS: carrying, lifting, bending, standing, squatting, stairs, transfers, bed mobility, bathing, toileting, dressing, reach over head, and hygiene/grooming  PARTICIPATION LIMITATIONS: meal prep, cleaning, laundry, driving, shopping, community activity, and church  PERSONAL FACTORS: Age, Past/current experiences, Time since onset of injury/illness/exacerbation, and 3+ comorbidities: Anemia, CKD, HTN, GERD, osteoporosis, L hip IM nailing  are also affecting patient's functional outcome.   REHAB POTENTIAL: Good  CLINICAL  DECISION MAKING: Evolving/moderate complexity  EVALUATION COMPLEXITY: Moderate   PLAN:  PT FREQUENCY: 2x/week for 3 weeks followed by 1x/week for 3 weeks  PT DURATION: 2x/week for 3 weeks followed by 1x/week for 3 weeks  PLANNED INTERVENTIONS: Therapeutic exercises, Therapeutic activity, Neuromuscular re-education, Balance training, Gait training, Patient/Family education, Self Care, Joint mobilization, Stair training, Vestibular training, Canalith repositioning, Cryotherapy, Moist heat, Taping, Manual therapy, and Re-evaluation  PLAN FOR NEXT SESSION:  Ask how pt did with Silver Sneakers.  Pt is feeling much better; went down to 1x/wk for this week.  Consider checking LTGs and discuss possible discharge if pt continuing to feel better.  Progress VOR training and habituation, balance exercises in corner/on foam   Lonia Blood, PT 06/29/23 10:57 AM Phone: [phone] Fax: [phone]   [facility] at [facility] [address] [place], [address] [place], [place] [address] Phone # [phone] Fax # [phone]

## 2023-06-30 DIAGNOSIS — R55 Syncope and collapse: Secondary | ICD-10-CM | POA: Diagnosis not present

## 2023-07-01 ENCOUNTER — Telehealth: Payer: Self-pay | Admitting: Cardiology

## 2023-07-01 ENCOUNTER — Ambulatory Visit: Payer: Medicare HMO

## 2023-07-01 MED ORDER — APIXABAN 5 MG PO TABS: 5.0000 mg | ORAL_TABLET | Freq: Two times a day (BID) | ORAL | 0 refills | Status: DC

## 2023-07-01 NOTE — Telephone Encounter (Signed)
Pt c/o medication issue:  1. Name of Medication:   apixaban (ELIQUIS) 5 MG TABS tablet    2. How are you currently taking this medication (dosage and times per day)?   3. Are you having a reaction (difficulty breathing--STAT)? no  4. What is your medication issue? Calling to see if our office have any samples.  and seeing if they can be switch of medication to another medication because they are unable to afford medication. Please advis d

## 2023-07-01 NOTE — Telephone Encounter (Signed)
Patient's dose as explained in other telephone encounter is 5mg  BID. Samples are ready for her to pick up.

## 2023-07-01 NOTE — Telephone Encounter (Signed)
Patient daughter states he is going to run out of Eliquis and he needs samples.  He cannot afford the medication and ask for other options.  Stated that his wife applied in the past but nothing "came of it".  Please advise if he should reapply for them both and if samples can be given.   Message sent along with wifes chart message

## 2023-07-01 NOTE — Telephone Encounter (Signed)
They should only re-apply for Eliquis patient assistance if they think that together they have spent 3% of their annual household income on prescription expenses  Other options would be:  Xarelto: Xarelto is a medication similar to Eliquis, but it is taken just once a day. The drug company offers a program called Xarelto with Me Coverage Gap Support. It runs from April 1-December 31. If you are in the coverage gap, you can get Xarelto for $89 (for 30 days) or $250 (for 90 days), plus sales tax if applicable. You can call to enroll at: 888-XARELTO 334 342 3168)   Warfarin: Warfarin is very inexpensive but does require more monitoring. You will need to come to clinic for weekly INR (blood test) checks with our Anticoagulation Clinic for the first month. These visits may have a copay. Then as we find a stable dose for you, you will start to come less often. You will be required to have your INR checked at least every 6 weeks. There are also more drug interactions and dietary interactions with warfarin.

## 2023-07-01 NOTE — Telephone Encounter (Signed)
Gave all information to daughter in great detail.  She is aware that there are samples of Eliquis and the applications at the front desk.  She understands what is needed for the assistance.    She is also made aware that patient Dose of Eliquis is 5mg  BID NOT 2.5 mg.  She states understanding of this information

## 2023-07-01 NOTE — Telephone Encounter (Signed)
I discussed his dose in great length.  I am not sure what more I can tell her.  Please speak with daughter and advise on his dose and reasons please

## 2023-07-01 NOTE — Telephone Encounter (Signed)
OK for samples?  Please confirm dose as his is 5mg  tablet but take 2.5 mg BID.  Does he need 2./5 mg BID? If so will give samples of 2.5 mg BID

## 2023-07-01 NOTE — Telephone Encounter (Signed)
Pt c/o medication issue:  1. Name of Medication: Eliquis  2. How are you currently taking this medication (dosage and times per day)?   3. Are you having a reaction (difficulty breathing--STAT)?   4. What is your medication issue? Daughter said patient have been taking 2.5 mg 2 times a dayper Dr [place]- she said the pharmacist said he should have been taking 5 mg of Eliquis 2 times a day

## 2023-07-01 NOTE — Telephone Encounter (Signed)
His dose should be 5mg  BID. At one point his scr increased above 1.5 but his scr has been <1.5 for several months. He needs to take 5mg  BID.

## 2023-07-02 NOTE — Telephone Encounter (Signed)
Spoke to patient's wife advised Eliquis 5 mg samples left at front desk.Wife stated she wanted to check with Dr.Jordan about dosage.Stated she thought Dr.Jordan wanted [name] to take 2.5 mg twice a day.Advised I will send message to him for advice.

## 2023-07-03 ENCOUNTER — Encounter: Payer: Self-pay | Admitting: Cardiology

## 2023-07-03 NOTE — Telephone Encounter (Signed)
error 

## 2023-07-03 NOTE — Telephone Encounter (Signed)
Spoke to patient's wife Dr.Jordan's advice given

## 2023-07-05 ENCOUNTER — Other Ambulatory Visit: Payer: Self-pay | Admitting: Cardiology

## 2023-07-06 ENCOUNTER — Ambulatory Visit: Payer: Medicare HMO

## 2023-07-06 DIAGNOSIS — R2681 Unsteadiness on feet: Secondary | ICD-10-CM | POA: Diagnosis not present

## 2023-07-06 DIAGNOSIS — R42 Dizziness and giddiness: Secondary | ICD-10-CM | POA: Diagnosis not present

## 2023-07-06 NOTE — Therapy (Signed)
OUTPATIENT PHYSICAL THERAPY VESTIBULAR TREATMENT NOTE     Patient Name: [name] MRN: [id number] DOB:01/25/1935, 88 y.o., male Today's Date: 07/06/2023  END OF SESSION:  PT End of Session - 07/06/23 1017     Visit Number 5    Number of Visits 10    Date for PT Re-Evaluation 07/22/23    Authorization Type [employer]    Authorization Time Period 06/10/2023-07/22/2023    Authorization - Visit Number 5    Authorization - Number of Visits 10    PT Start Time 1015    PT Stop Time 1100    PT Time Calculation (min) 45 min    Activity Tolerance Patient tolerated treatment well    Behavior During Therapy [facility] for tasks assessed/performed                Past Medical History:  Diagnosis Date   Anemia    Arthritis    osteoarthritis  back   Celiac disease    Chronic kidney disease    Collagenous colitis    Diverticulosis    Enlarged prostate    followed by dr  [name]   Esophageal dysmotility    Esophageal dysmotility    Esophageal stenosis    Essential hypertension, benign 03/29/2014   External hemorrhoids    External hemorrhoids    Full dentures    GERD (gastroesophageal reflux disease)    Hemorrhoids    Hiatal hernia    IBS (irritable bowel syndrome)    Internal hemorrhoids    OSA on CPAP 10/17/2018   Osteoporosis    Postoperative anemia due to acute blood loss 03/29/2014   Schatzki's ring    Sleep apnea    CPAP Machine   Ulcer    Past Surgical History:  Procedure Laterality Date   APPENDECTOMY  1963   CARDIOVERSION N/A 12/16/2022   Procedure: CARDIOVERSION;  Surgeon: Lewayne Bunting, MD;  Location: [facility] ENDOSCOPY;  Service: Cardiovascular;  Laterality: N/A;   ESOPHAGOGASTRODUODENOSCOPY  01/09/2022   EYE SURGERY Bilateral    cataract removal - implants   HERNIA REPAIR  2002   right inguinal    HIP SURGERY Left    INGUINAL HERNIA REPAIR  08/19/2011   Procedure: HERNIA REPAIR  left INGUINAL ADULT;  Surgeon: Adolph Pollack, MD;  Location: [facility] ORS;   Service: General;  Laterality: Left;   INTRAMEDULLARY (IM) NAIL INTERTROCHANTERIC Left 03/27/2014   Procedure: INTRAMEDULLARY (IM) NAIL INTERTROCHANTRIC;  Surgeon: Shelda Pal, MD;  Location: [facility] ORS;  Service: Orthopedics;  Laterality: Left;   RIGHT/LEFT HEART CATH AND CORONARY ANGIOGRAPHY N/A 03/12/2017   Procedure: Right/Left Heart Cath and Coronary Angiography;  Surgeon: Dolores Patty, MD;  Location: [facility] INVASIVE CV LAB;  Service: Cardiovascular;  Laterality: N/A;   TEE WITHOUT CARDIOVERSION N/A 12/16/2022   Procedure: TRANSESOPHAGEAL ECHOCARDIOGRAM (TEE);  Surgeon: Lewayne Bunting, MD;  Location: [facility] ENDOSCOPY;  Service: Cardiovascular;  Laterality: N/A;   UPPER GASTROINTESTINAL ENDOSCOPY  2008   Patient Active Problem List   Diagnosis Date Noted   Dizziness 05/26/2023   Hypokalemia 05/26/2023   Vestibular neuritis 05/26/2023   PAF (paroxysmal atrial fibrillation) (HCC) 01/21/2023   Diarrhea 05/19/2019   OSA on CPAP 10/17/2018   Essential hypertension, benign 03/29/2014   BPH (benign prostatic hyperplasia) 03/29/2014   Postoperative anemia due to acute blood loss 03/29/2014   Fracture, intertrochanteric, left femur (HCC) 03/27/2014   Hip fracture (HCC) 03/27/2014   DYSPNEA 06/05/2009   ESOPHAGEAL STRICTURE 06/04/2009   GERD  06/04/2009   PUD 06/04/2009   DEGENERATIVE DISC DISEASE 06/04/2009   MUSCLE SPASM, BACK 06/04/2009    PCP:    Chilton Greathouse, MD   REFERRING PROVIDER:    Chilton Greathouse, MD    REFERRING DIAG: R42 (ICD-10-CM) - Dizziness and giddiness  THERAPY DIAG:  Dizziness and giddiness  Unsteadiness on feet  ONSET DATE: 05/26/23  Rationale for Evaluation and Treatment: Rehabilitation  SUBJECTIVE:   SUBJECTIVE STATEMENT: Overall feeling better.  Went back to [employer] and it went well. Had HA for 3-4 hr after Thursday's class but no dizziness accompanying.  Pt accompanied by: self, daughter  PERTINENT HISTORY: Anemia, CKD, HTN, GERD,  osteoporosis, L hip IM nailing  PAIN:  Are you having pain? No  PRECAUTIONS: Fall; currently wearing heart monitor   RED FLAGS: None   WEIGHT BEARING RESTRICTIONS: No  FALLS: Has patient fallen in last 6 months? No  LIVING ENVIRONMENT: Lives with: lives with their spouse Lives in: House/apartment Stairs:  3 steps to enter with handrail; 1 story Has following equipment at home: Single point cane, Environmental consultant - 2 wheeled, shower chair, and Grab bars  PLOF: Independent, retired  PATIENT GOALS: return to [employer] at [employer]   OBJECTIVE:   TODAY'S TREATMENT: 07/06/23 Activity Comments  M-CTSIB Condition 1: normal Condition 2: normal Condition 3: normal Condition 4: mild  Dynamic Gait Index  23/24  FOTO 65  VOR x 1 X30 sec, no symptoms  Forward-T at counter 2x5 For bending habituation  Multi-sensory static balance challenges To facilitate righting reactions  Dynamic balance activities To facilitate righting reactions     TODAY'S TREATMENT: 06/29/2023 Activity Comments  Reviewed pencil push-ups and pt return demo understanding   Sidestepping R and L x 2 minutes Forward/back walking x 2 min Intermittent UE support in parallel bars  Alt step taps 2 x 10 reps to 6" steps 2nd set standing on Airex  On Airex: -feet apart EO/EC head turns/nods 5 reps, min sway and no UE support  At parallel bars: Tandem gait forward/back, 3 reps Tandem march forward/back, 3 reps 1 UE support  Standing gaze stabilization  -feet apart, feet together-horizontal and vertical Mild unsteadiness with feet together, no dizziness symptoms  Gait with head turns 25 ft x 3 reps Gait with head nods 25 ft x 3        HOME EXERCISE PROGRAM: Access Code: [email or web address] URL: [email or web address] Date: 06/24/2023 Prepared by: [facility] - Outpatient  Rehab - [facility]  Program Notes perform standing exercises in corner for safety; perform standing gaze stabilization at counter  with letter target  Exercises - Brandt-Daroff Vestibular Exercise  - 1 x daily - 5 x weekly - 2 sets - 3-5 reps - Romberg Stance with Eyes Closed  - 1 x daily - 5 x weekly - 2-3 sets - 30 sec hold - Standing Gaze Stabilization with Head Rotation  - 1-2 x daily - 7 x weekly - 1 sets - 3 reps - 30 sec hold - Standing Gaze Stabilization with Head Nod  - 1-2 x daily - 7 x weekly - 1 sets - 3 reps - 30 sec hold - Pencil Pushups  - 1-2 x daily - 7 x weekly - 1 sets - 3-5 reps    PATIENT EDUCATION: Education details: HEP additions/modifications-hold to counter and use letter target at counter (versus using his thumb as target); reviewed rationale for habituation exercises and to bring symptoms on no greater than 3-4/10 Person educated: Patient  and Child(ren) Education method: Explanation, Demonstration, Verbal cues, and Handouts Education comprehension: verbalized understanding and returned demonstration  ---------------------------------------------------------- Objective measures below taken at initial evaluation:  DIAGNOSTIC FINDINGS: 05/26/23 brain MRI: No acute intracranial pathology. Pansinusitis with layering fluid which could reflect acute sinusitis in the correct clinical setting. Trace right mastoid effusion.  COGNITION: Overall cognitive status: Within functional limits for tasks assessed   SENSATION: Pt reports intact in B UEs/LEs   POSTURE:  Elevated shoulders, FHP, thoracic kyphosis   GAIT: Gait pattern: WFL Assistive device utilized: Single point cane Level of assistance: SBA   FUNCTIONAL TESTS:   M-CTSIB  Condition 1: Firm Surface, EO 30 Sec, Normal and Mild Sway  Condition 2: Firm Surface, EC 30 Sec, Mild and Moderate Sway  Condition 3: Foam Surface, EO 30 Sec, Mild and Moderate Sway  Condition 4: Foam Surface, EC 30 Sec, Moderate Sway     PATIENT SURVEYS:  FOTO 39, 56  VESTIBULAR ASSESSMENT:  GENERAL OBSERVATION: pt wears distance glasses    OCULOMOTOR  EXAM:  Ocular Alignment: slight L hypertropia  Ocular ROM: No Limitations  Spontaneous Nystagmus: absent  Gaze-Induced Nystagmus: absent  Smooth Pursuits: intact  Saccades: intact   VESTIBULAR - OCULAR REFLEX:   Slow VOR: Normal c/o dizziness with horizontal>vertical   VOR Cancellation: Normal c/o wooziness   Head-Impulse Test: HIT Right: negative HIT Left: positive *c/o wooziness and throbbing in eyes       POSITIONAL TESTING:  Right Roll Test: negative Left Roll Test: negative  Right Sidelying: c/o woozy laying down and sitting up; negative  Left Sidelying: c/o woozy laying down and sitting up; negative      PATIENT EDUCATION: Education details: prognosis, POC, HEP Person educated: Patient Education method: Programmer, multimedia, Demonstration, Actor cues, Verbal cues, and Handouts Education comprehension: verbalized understanding and returned demonstration  HOME EXERCISE PROGRAM: Access Code: [email or web address] URL: [email or web address] Date: 06/10/2023 Prepared by: [facility] - Outpatient  Rehab - [facility]  Program Notes perform standing exercises in corner for safety  Exercises - Seated Gaze Stabilization with Head Nod  - 1 x daily - 5 x weekly - 2-3 sets - 30 sec hold - Seated Gaze Stabilization with Head Rotation  - 1 x daily - 5 x weekly - 2-3 sets - 30 sec hold - Brandt-Daroff Vestibular Exercise  - 1 x daily - 5 x weekly - 2 sets - 3-5 reps - Romberg Stance with Eyes Closed  - 1 x daily - 5 x weekly - 2-3 sets - 30 sec hold   GOALS: Goals reviewed with patient? Yes  SHORT TERM GOALS: Target date: 07/01/2023  Patient to be independent with initial HEP. Baseline: HEP initiated and met Goal status: MET 06/29/2023    LONG TERM GOALS: Target date: 07/22/2023  Patient to be independent with advanced HEP. Baseline: Not yet initiated  Goal status: IN PROGRESS  Patient to report 0/10 dizziness with standing vertical and horizontal VOR for 30  seconds. Baseline: Unable Goal status: IN PROGRESS  Patient will report 0/10 dizziness with bed mobility.  Baseline: Symptomatic  Goal status: IN PROGRESS  Patient to demonstrate mild-moderate sway with M-CTSIB condition with eyes closed/foam surface in order to improve safety in environments with uneven surfaces and dim lighting. Baseline: moderate sway Goal status: MET  Patient to score at least 20/24 on DGI in order to decrease risk of falls. Baseline: 20/24 at baseline 06/22/2023 Goal status: MET  Patient to return to silver sneakers classless with modifications as needed for  safety.  Baseline: Unable; initiated Goal status: IN PROGRESS  Patient to score at least 56 on FOTO in order to indicate improved functional outcomes.  Baseline: 39; 65 Goal status: MET    ASSESSMENT:  CLINICAL IMPRESSION: Pt notes continued improvement and denies symptoms with bed mobility/position change other than bending forward and arising quickly. Able to perform DGI w/ 23/24 indicating low risk for falls and demonstrates improved stability M-CTSIB. FOTO survey reveals subjective report improvement from initial 39 to 65.  Able to progress with habituation and balance activities with pt reporting ability to initiate Silver Sneakers class without adverse response. Continued sessions 1x/wk for progression  OBJECTIVE IMPAIRMENTS: decreased activity tolerance, decreased balance, dizziness, and postural dysfunction.   ACTIVITY LIMITATIONS: carrying, lifting, bending, standing, squatting, stairs, transfers, bed mobility, bathing, toileting, dressing, reach over head, and hygiene/grooming  PARTICIPATION LIMITATIONS: meal prep, cleaning, laundry, driving, shopping, community activity, and church  PERSONAL FACTORS: Age, Past/current experiences, Time since onset of injury/illness/exacerbation, and 3+ comorbidities: Anemia, CKD, HTN, GERD, osteoporosis, L hip IM nailing  are also affecting patient's functional  outcome.   REHAB POTENTIAL: Good  CLINICAL DECISION MAKING: Evolving/moderate complexity  EVALUATION COMPLEXITY: Moderate   PLAN:  PT FREQUENCY: 2x/week for 3 weeks followed by 1x/week for 3 weeks  PT DURATION: 2x/week for 3 weeks followed by 1x/week for 3 weeks  PLANNED INTERVENTIONS: Therapeutic exercises, Therapeutic activity, Neuromuscular re-education, Balance training, Gait training, Patient/Family education, Self Care, Joint mobilization, Stair training, Vestibular training, Canalith repositioning, Cryotherapy, Moist heat, Taping, Manual therapy, and Re-evaluation  PLAN FOR NEXT SESSION:  Progress higher level balance   11:09 AM, 07/06/23 M. Shary Decamp, PT, DPT Physical Therapist- [place] Office Number: [phone]

## 2023-07-06 NOTE — Addendum Note (Signed)
Encounter addended by: Crissie Figures, RN on: 07/06/2023 2:15 PM  Actions taken: Imaging Exam ended

## 2023-07-08 ENCOUNTER — Ambulatory Visit: Payer: Medicare HMO

## 2023-07-13 ENCOUNTER — Ambulatory Visit: Payer: Medicare HMO | Admitting: Physical Therapy

## 2023-07-14 DIAGNOSIS — L578 Other skin changes due to chronic exposure to nonionizing radiation: Secondary | ICD-10-CM | POA: Diagnosis not present

## 2023-07-14 DIAGNOSIS — L821 Other seborrheic keratosis: Secondary | ICD-10-CM | POA: Diagnosis not present

## 2023-07-14 DIAGNOSIS — L570 Actinic keratosis: Secondary | ICD-10-CM | POA: Diagnosis not present

## 2023-07-14 DIAGNOSIS — Z85828 Personal history of other malignant neoplasm of skin: Secondary | ICD-10-CM | POA: Diagnosis not present

## 2023-07-20 ENCOUNTER — Ambulatory Visit: Payer: Medicare HMO | Attending: Internal Medicine

## 2023-07-20 DIAGNOSIS — R2681 Unsteadiness on feet: Secondary | ICD-10-CM | POA: Insufficient documentation

## 2023-07-20 DIAGNOSIS — R42 Dizziness and giddiness: Secondary | ICD-10-CM | POA: Diagnosis not present

## 2023-07-20 NOTE — Therapy (Signed)
OUTPATIENT PHYSICAL THERAPY VESTIBULAR TREATMENT NOTE and D/C Summary     Patient Name: [name] MRN: [id number] DOB:1935-03-22, 88 y.o., male Today's Date: 07/20/2023 PHYSICAL THERAPY DISCHARGE SUMMARY  Visits from Start of Care: 6  Current functional level related to goals / functional outcomes: Symptoms have resolved and all STG/LTG met   Remaining deficits: none   Education / Equipment: HEP   Patient agrees to discharge. Patient goals were met. Patient is being discharged due to meeting the stated rehab goals.  END OF SESSION:  PT End of Session - 07/20/23 1018     Visit Number 6    Number of Visits 10    Date for PT Re-Evaluation 07/22/23    Authorization Type [employer]    Authorization Time Period 06/10/2023-07/22/2023    Authorization - Visit Number 6    Authorization - Number of Visits 10    PT Start Time 1015    PT Stop Time 1100    PT Time Calculation (min) 45 min    Activity Tolerance Patient tolerated treatment well    Behavior During Therapy [facility] for tasks assessed/performed                Past Medical History:  Diagnosis Date   Anemia    Arthritis    osteoarthritis  back   Celiac disease    Chronic kidney disease    Collagenous colitis    Diverticulosis    Enlarged prostate    followed by dr  [name]   Esophageal dysmotility    Esophageal dysmotility    Esophageal stenosis    Essential hypertension, benign 03/29/2014   External hemorrhoids    External hemorrhoids    Full dentures    GERD (gastroesophageal reflux disease)    Hemorrhoids    Hiatal hernia    IBS (irritable bowel syndrome)    Internal hemorrhoids    OSA on CPAP 10/17/2018   Osteoporosis    Postoperative anemia due to acute blood loss 03/29/2014   Schatzki's ring    Sleep apnea    CPAP Machine   Ulcer    Past Surgical History:  Procedure Laterality Date   APPENDECTOMY  1963   CARDIOVERSION N/A 12/16/2022   Procedure: CARDIOVERSION;  Surgeon: Lewayne Bunting, MD;  Location: [facility] ENDOSCOPY;  Service: Cardiovascular;  Laterality: N/A;   ESOPHAGOGASTRODUODENOSCOPY  01/09/2022   EYE SURGERY Bilateral    cataract removal - implants   HERNIA REPAIR  2002   right inguinal    HIP SURGERY Left    INGUINAL HERNIA REPAIR  08/19/2011   Procedure: HERNIA REPAIR  left INGUINAL ADULT;  Surgeon: Adolph Pollack, MD;  Location: [facility] ORS;  Service: General;  Laterality: Left;   INTRAMEDULLARY (IM) NAIL INTERTROCHANTERIC Left 03/27/2014   Procedure: INTRAMEDULLARY (IM) NAIL INTERTROCHANTRIC;  Surgeon: Shelda Pal, MD;  Location: [facility] ORS;  Service: Orthopedics;  Laterality: Left;   RIGHT/LEFT HEART CATH AND CORONARY ANGIOGRAPHY N/A 03/12/2017   Procedure: Right/Left Heart Cath and Coronary Angiography;  Surgeon: Dolores Patty, MD;  Location: [facility] INVASIVE CV LAB;  Service: Cardiovascular;  Laterality: N/A;   TEE WITHOUT CARDIOVERSION N/A 12/16/2022   Procedure: TRANSESOPHAGEAL ECHOCARDIOGRAM (TEE);  Surgeon: Lewayne Bunting, MD;  Location: [facility] ENDOSCOPY;  Service: Cardiovascular;  Laterality: N/A;   UPPER GASTROINTESTINAL ENDOSCOPY  2008   Patient Active Problem List   Diagnosis Date Noted   Dizziness 05/26/2023   Hypokalemia 05/26/2023   Vestibular neuritis 05/26/2023   PAF (paroxysmal  atrial fibrillation) (HCC) 01/21/2023   Diarrhea 05/19/2019   OSA on CPAP 10/17/2018   Essential hypertension, benign 03/29/2014   BPH (benign prostatic hyperplasia) 03/29/2014   Postoperative anemia due to acute blood loss 03/29/2014   Fracture, intertrochanteric, left femur (HCC) 03/27/2014   Hip fracture (HCC) 03/27/2014   DYSPNEA 06/05/2009   ESOPHAGEAL STRICTURE 06/04/2009   GERD 06/04/2009   Peptic ulcer 06/04/2009   DEGENERATIVE DISC DISEASE 06/04/2009   MUSCLE SPASM, BACK 06/04/2009    PCP:    Chilton Greathouse, MD   REFERRING PROVIDER:    Chilton Greathouse, MD    REFERRING DIAG: R42 (ICD-10-CM) - Dizziness and giddiness  THERAPY DIAG:   Dizziness and giddiness  Unsteadiness on feet  ONSET DATE: 05/26/23  Rationale for Evaluation and Treatment: Rehabilitation  SUBJECTIVE:   SUBJECTIVE STATEMENT: Overall feeling better. Been back to two SilverSneakers classes since last PT session, no HA following those. Currently no symptoms. Working on the forward bending and this is also not symptomatic any longer  Pt accompanied by: self, daughter  PERTINENT HISTORY: Anemia, CKD, HTN, GERD, osteoporosis, L hip IM nailing  PAIN:  Are you having pain? No  PRECAUTIONS: Fall; currently wearing heart monitor   RED FLAGS: None   WEIGHT BEARING RESTRICTIONS: No  FALLS: Has patient fallen in last 6 months? No  LIVING ENVIRONMENT: Lives with: lives with their spouse Lives in: House/apartment Stairs:  3 steps to enter with handrail; 1 story Has following equipment at home: Single point cane, Environmental consultant - 2 wheeled, shower chair, and Grab bars  PLOF: Independent, retired  PATIENT GOALS: return to [employer] at [employer]   OBJECTIVE:   TODAY'S TREATMENT: 07/20/23 Activity Comments  Standing VOR x 1 x 30 sec Horizontal: no symptoms Vertical: no symptoms  VOR x 2 Assist for coordinating, no symptoms  Near Point Convergence 12 cm, no symptoms  Forward-T at counter For forward bending habituation  HEP review         TODAY'S TREATMENT: 07/06/23 Activity Comments  M-CTSIB Condition 1: normal Condition 2: normal Condition 3: normal Condition 4: mild  Dynamic Gait Index  23/24  FOTO 65  VOR x 1 X30 sec, no symptoms  Forward-T at counter 2x5 For bending habituation  Multi-sensory static balance challenges To facilitate righting reactions  Dynamic balance activities To facilitate righting reactions     TODAY'S TREATMENT: 06/29/2023 Activity Comments  Reviewed pencil push-ups and pt return demo understanding   Sidestepping R and L x 2 minutes Forward/back walking x 2 min Intermittent UE support in parallel bars  Alt  step taps 2 x 10 reps to 6" steps 2nd set standing on Airex  On Airex: -feet apart EO/EC head turns/nods 5 reps, min sway and no UE support  At parallel bars: Tandem gait forward/back, 3 reps Tandem march forward/back, 3 reps 1 UE support  Standing gaze stabilization  -feet apart, feet together-horizontal and vertical Mild unsteadiness with feet together, no dizziness symptoms  Gait with head turns 25 ft x 3 reps Gait with head nods 25 ft x 3        HOME EXERCISE PROGRAM: Access Code: [email or web address] URL: [email or web address] Date: 06/24/2023 Prepared by: [facility] - Outpatient  Rehab - [facility]  Program Notes perform standing exercises in corner for safety; perform standing gaze stabilization at counter with letter target  Exercises - Brandt-Daroff Vestibular Exercise  - 1 x daily - 5 x weekly - 2 sets - 3-5 reps - Romberg Stance with Eyes  Closed  - 1 x daily - 5 x weekly - 2-3 sets - 30 sec hold - Standing Gaze Stabilization with Head Rotation  - 1-2 x daily - 7 x weekly - 1 sets - 3 reps - 30 sec hold - Standing Gaze Stabilization with Head Nod  - 1-2 x daily - 7 x weekly - 1 sets - 3 reps - 30 sec hold - Pencil Pushups  - 1-2 x daily - 7 x weekly - 1 sets - 3-5 reps    PATIENT EDUCATION: Education details: HEP additions/modifications-hold to counter and use letter target at counter (versus using his thumb as target); reviewed rationale for habituation exercises and to bring symptoms on no greater than 3-4/10 Person educated: Patient and Child(ren) Education method: Explanation, Demonstration, Verbal cues, and Handouts Education comprehension: verbalized understanding and returned demonstration  ---------------------------------------------------------- Objective measures below taken at initial evaluation:  DIAGNOSTIC FINDINGS: 05/26/23 brain MRI: No acute intracranial pathology. Pansinusitis with layering fluid which could reflect acute sinusitis in the  correct clinical setting. Trace right mastoid effusion.  COGNITION: Overall cognitive status: Within functional limits for tasks assessed   SENSATION: Pt reports intact in B UEs/LEs   POSTURE:  Elevated shoulders, FHP, thoracic kyphosis   GAIT: Gait pattern: WFL Assistive device utilized: Single point cane Level of assistance: SBA   FUNCTIONAL TESTS:   M-CTSIB  Condition 1: Firm Surface, EO 30 Sec, Normal and Mild Sway  Condition 2: Firm Surface, EC 30 Sec, Mild and Moderate Sway  Condition 3: Foam Surface, EO 30 Sec, Mild and Moderate Sway  Condition 4: Foam Surface, EC 30 Sec, Moderate Sway     PATIENT SURVEYS:  FOTO 39, 56  VESTIBULAR ASSESSMENT:  GENERAL OBSERVATION: pt wears distance glasses    OCULOMOTOR EXAM:  Ocular Alignment: slight L hypertropia  Ocular ROM: No Limitations  Spontaneous Nystagmus: absent  Gaze-Induced Nystagmus: absent  Smooth Pursuits: intact  Saccades: intact   VESTIBULAR - OCULAR REFLEX:   Slow VOR: Normal c/o dizziness with horizontal>vertical   VOR Cancellation: Normal c/o wooziness   Head-Impulse Test: HIT Right: negative HIT Left: positive *c/o wooziness and throbbing in eyes       POSITIONAL TESTING:  Right Roll Test: negative Left Roll Test: negative  Right Sidelying: c/o woozy laying down and sitting up; negative  Left Sidelying: c/o woozy laying down and sitting up; negative      PATIENT EDUCATION: Education details: prognosis, POC, HEP Person educated: Patient Education method: Programmer, multimedia, Demonstration, Actor cues, Verbal cues, and Handouts Education comprehension: verbalized understanding and returned demonstration  HOME EXERCISE PROGRAM: Access Code: [email or web address] URL: [email or web address] Date: 06/10/2023 Prepared by: [facility] - Outpatient  Rehab - [facility]  Program Notes perform standing exercises in corner for safety  Exercises - Seated Gaze Stabilization with Head Nod  - 1 x  daily - 5 x weekly - 2-3 sets - 30 sec hold - Seated Gaze Stabilization with Head Rotation  - 1 x daily - 5 x weekly - 2-3 sets - 30 sec hold - Brandt-Daroff Vestibular Exercise  - 1 x daily - 5 x weekly - 2 sets - 3-5 reps - Romberg Stance with Eyes Closed  - 1 x daily - 5 x weekly - 2-3 sets - 30 sec hold   GOALS: Goals reviewed with patient? Yes  SHORT TERM GOALS: Target date: 07/01/2023  Patient to be independent with initial HEP. Baseline: HEP initiated and met Goal status: MET 06/29/2023    LONG TERM GOALS:  Target date: 07/22/2023  Patient to be independent with advanced HEP. Baseline: Not yet initiated  Goal status: MET  Patient to report 0/10 dizziness with standing vertical and horizontal VOR for 30 seconds. Baseline: Unable; no symptoms Goal status: MET  Patient will report 0/10 dizziness with bed mobility.  Baseline: Symptomatic; (07/20/23) no symptoms  Goal status: MET  Patient to demonstrate mild-moderate sway with M-CTSIB condition with eyes closed/foam surface in order to improve safety in environments with uneven surfaces and dim lighting. Baseline: moderate sway Goal status: MET  Patient to score at least 20/24 on DGI in order to decrease risk of falls. Baseline: 20/24 at baseline 06/22/2023 Goal status: MET  Patient to return to silver sneakers classless with modifications as needed for safety.  Baseline: Unable; initiated; (07/20/23) attending Goal status: MET  Patient to score at least 56 on FOTO in order to indicate improved functional outcomes.  Baseline: 39; 65 Goal status: MET    ASSESSMENT:  CLINICAL IMPRESSION: Reports symptoms have resolved and no issues with position change or demands to VOR and performing typical routine/activities without issue.  Reports he has resumed his normal exercise classes and has not experienced any issue of HA or activity tolerance limitation.  No further concerns noted or reported and feels confident with  self-management at this time. Will D/C to home and community activities.   OBJECTIVE IMPAIRMENTS: decreased activity tolerance, decreased balance, dizziness, and postural dysfunction.   ACTIVITY LIMITATIONS: carrying, lifting, bending, standing, squatting, stairs, transfers, bed mobility, bathing, toileting, dressing, reach over head, and hygiene/grooming  PARTICIPATION LIMITATIONS: meal prep, cleaning, laundry, driving, shopping, community activity, and church  PERSONAL FACTORS: Age, Past/current experiences, Time since onset of injury/illness/exacerbation, and 3+ comorbidities: Anemia, CKD, HTN, GERD, osteoporosis, L hip IM nailing  are also affecting patient's functional outcome.   REHAB POTENTIAL: Good  CLINICAL DECISION MAKING: Evolving/moderate complexity  EVALUATION COMPLEXITY: Moderate   PLAN:  PT FREQUENCY: 2x/week for 3 weeks followed by 1x/week for 3 weeks  PT DURATION: 2x/week for 3 weeks followed by 1x/week for 3 weeks  PLANNED INTERVENTIONS: Therapeutic exercises, Therapeutic activity, Neuromuscular re-education, Balance training, Gait training, Patient/Family education, Self Care, Joint mobilization, Stair training, Vestibular training, Canalith repositioning, Cryotherapy, Moist heat, Taping, Manual therapy, and Re-evaluation  PLAN FOR NEXT SESSION:  D/C to HEP   10:19 AM, 07/20/23 M. Shary Decamp, PT, DPT Physical Therapist- [place] Office Number: [phone]

## 2023-07-21 ENCOUNTER — Encounter: Payer: Medicare HMO | Admitting: Internal Medicine

## 2023-07-28 DIAGNOSIS — I509 Heart failure, unspecified: Secondary | ICD-10-CM | POA: Diagnosis not present

## 2023-07-28 DIAGNOSIS — I130 Hypertensive heart and chronic kidney disease with heart failure and stage 1 through stage 4 chronic kidney disease, or unspecified chronic kidney disease: Secondary | ICD-10-CM | POA: Diagnosis not present

## 2023-07-28 DIAGNOSIS — F0390 Unspecified dementia without behavioral disturbance: Secondary | ICD-10-CM | POA: Diagnosis not present

## 2023-07-28 DIAGNOSIS — K900 Celiac disease: Secondary | ICD-10-CM | POA: Diagnosis not present

## 2023-07-28 DIAGNOSIS — N1831 Chronic kidney disease, stage 3a: Secondary | ICD-10-CM | POA: Diagnosis not present

## 2023-07-28 DIAGNOSIS — Z23 Encounter for immunization: Secondary | ICD-10-CM | POA: Diagnosis not present

## 2023-07-28 DIAGNOSIS — H8120 Vestibular neuronitis, unspecified ear: Secondary | ICD-10-CM | POA: Diagnosis not present

## 2023-07-28 DIAGNOSIS — I4891 Unspecified atrial fibrillation: Secondary | ICD-10-CM | POA: Diagnosis not present

## 2023-07-28 DIAGNOSIS — M1990 Unspecified osteoarthritis, unspecified site: Secondary | ICD-10-CM | POA: Diagnosis not present

## 2023-08-10 ENCOUNTER — Encounter: Payer: Self-pay | Admitting: Cardiology

## 2023-08-10 ENCOUNTER — Ambulatory Visit: Payer: Medicare HMO | Attending: Cardiology | Admitting: Cardiology

## 2023-08-10 VITALS — BP 120/72 | HR 64 | Ht 66.0 in | Wt 147.8 lb

## 2023-08-10 DIAGNOSIS — G4733 Obstructive sleep apnea (adult) (pediatric): Secondary | ICD-10-CM

## 2023-08-10 DIAGNOSIS — I10 Essential (primary) hypertension: Secondary | ICD-10-CM

## 2023-08-10 NOTE — Patient Instructions (Signed)

## 2023-08-10 NOTE — Progress Notes (Addendum)
Evaluation Performed:  Follow-up visit  Date:  08/10/2023   ID:  [name], DOB 10/09/1935, MRN [id number]  PCP:  Chilton Greathouse, MD  Sleep Medicine:  Armanda Magic, MD Electrophysiologist:  None   Chief Complaint:  OSA, HTN  History of Present Illness:    [name] is a 88 y.o. male  with a hx of mild OSA with an AHI of 12/hr and oxygen desaturations as low as 79%.  He is currently on CPAP at 9cm H2O.  He is doing well with his PAP device and thinks that he has gotten used to it.  He has used a FF mask in the past.  He ordered a new mask and recently called to order new cushions but they no longer make cushions for his mask.  His DME is now ordering a new mask that is similar to his last mask.  He tolerates the mask and feels the pressure is adequate.  Since going on PAP he feels rested in the am and has no significant daytime sleepiness.  He denies any significant mouth or nasal dryness or nasal congestion.  He does not think that he snores.     Prior CV studies:   The following studies were reviewed today:  PAP compliance download  Past Medical History:  Diagnosis Date   Anemia    Arthritis    osteoarthritis  back   Celiac disease    Chronic kidney disease    Collagenous colitis    Diverticulosis    Enlarged prostate    followed by dr  [name]   Esophageal dysmotility    Esophageal dysmotility    Esophageal stenosis    Essential hypertension, benign 03/29/2014   External hemorrhoids    External hemorrhoids    Full dentures    GERD (gastroesophageal reflux disease)    Hemorrhoids    Hiatal hernia    IBS (irritable bowel syndrome)    Internal hemorrhoids    OSA on CPAP 10/17/2018   Osteoporosis    Postoperative anemia due to acute blood loss 03/29/2014   Schatzki's ring    Sleep apnea    CPAP Machine   Ulcer    Past Surgical History:  Procedure Laterality Date   APPENDECTOMY  1963   CARDIOVERSION N/A 12/16/2022   Procedure: CARDIOVERSION;  Surgeon:  Lewayne Bunting, MD;  Location: [facility] ENDOSCOPY;  Service: Cardiovascular;  Laterality: N/A;   ESOPHAGOGASTRODUODENOSCOPY  01/09/2022   EYE SURGERY Bilateral    cataract removal - implants   HERNIA REPAIR  2002   right inguinal    HIP SURGERY Left    INGUINAL HERNIA REPAIR  08/19/2011   Procedure: HERNIA REPAIR  left INGUINAL ADULT;  Surgeon: Adolph Pollack, MD;  Location: [facility] ORS;  Service: General;  Laterality: Left;   INTRAMEDULLARY (IM) NAIL INTERTROCHANTERIC Left 03/27/2014   Procedure: INTRAMEDULLARY (IM) NAIL INTERTROCHANTRIC;  Surgeon: Shelda Pal, MD;  Location: [facility] ORS;  Service: Orthopedics;  Laterality: Left;   RIGHT/LEFT HEART CATH AND CORONARY ANGIOGRAPHY N/A 03/12/2017   Procedure: Right/Left Heart Cath and Coronary Angiography;  Surgeon: Dolores Patty, MD;  Location: [facility] INVASIVE CV LAB;  Service: Cardiovascular;  Laterality: N/A;   TEE WITHOUT CARDIOVERSION N/A 12/16/2022   Procedure: TRANSESOPHAGEAL ECHOCARDIOGRAM (TEE);  Surgeon: Lewayne Bunting, MD;  Location: [facility] ENDOSCOPY;  Service: Cardiovascular;  Laterality: N/A;   UPPER GASTROINTESTINAL ENDOSCOPY  2008     Current Meds  Medication Sig   acetaminophen (TYLENOL) 500 MG tablet  Take 1,000 mg by mouth as needed for mild pain or moderate pain.   albuterol (PROVENTIL HFA;VENTOLIN HFA) 108 (90 Base) MCG/ACT inhaler Inhale 2 puffs into the lungs every 6 (six) hours as needed for wheezing or shortness of breath.   amLODipine (NORVASC) 5 MG tablet Take 5 mg by mouth daily.   apixaban (ELIQUIS) 5 MG TABS tablet Take 1 tablet (5 mg total) by mouth 2 (two) times daily.   diclofenac Sodium (VOLTAREN) 1 % GEL Apply 2 g topically 4 (four) times daily as needed (pain.).   dicyclomine (BENTYL) 10 MG capsule Take 10 mg by mouth 2 (two) times daily as needed (abdominal pain/spasms.).   doxazosin (CARDURA) 8 MG tablet Take 4 mg by mouth daily.   ferrous sulfate 325 (65 FE) MG tablet Take 325 mg by mouth at bedtime.   flecainide  (TAMBOCOR) 50 MG tablet Take 50 mg by mouth 2 (two) times daily. Per patient taking 2 tablets in the morning and taking 2 tablets at night   furosemide (LASIX) 20 MG tablet Take 1 tablet (20 mg total) by mouth daily.   furosemide (LASIX) 40 MG tablet Take 40 mg by mouth daily.   gabapentin (NEURONTIN) 600 MG tablet Take 600 mg by mouth at bedtime.   loratadine (CLARITIN) 10 MG tablet Take 10 mg by mouth in the morning.   magnesium oxide (MAG-OX) 400 (240 Mg) MG tablet Take 400 mg by mouth in the morning.   potassium chloride SA (KLOR-CON M) 20 MEQ tablet Take 1 tablet (20 mEq total) by mouth daily with supper.   sertraline (ZOLOFT) 50 MG tablet Take 25 mg by mouth at bedtime.   zolpidem (AMBIEN) 10 MG tablet Take 5 mg by mouth at bedtime as needed for sleep.   [DISCONTINUED] famotidine (PEPCID) 40 MG tablet Take 1 tablet (40 mg total) by mouth 2 (two) times daily.   [DISCONTINUED] flecainide (TAMBOCOR) 100 MG tablet Take 1 tablet (100 mg total) by mouth 2 (two) times daily.   [DISCONTINUED] flecainide (TAMBOCOR) 50 MG tablet TAKE 1 TABLET BY MOUTH TWICE A DAY FOR 4 DAYS, IF TOLERATED THEN INCREASE TO 2 TABLETS BY MOUTH TWICE A DAY   [DISCONTINUED] predniSONE (DELTASONE) 10 MG tablet 60 mg daily on days 2 through 5, 40 mg on day 6, 30 mg on day 7, 20 mg on day 8, 10 mg on day 9, and 5 mg on day 10.     Allergies:   Gluten meal and Wheat   Social History   Tobacco Use   Smoking status: Former    Current packs/day: 0.00    Types: Cigarettes    Quit date: 08/11/1981    Years since quitting: 42.0   Smokeless tobacco: Never  Vaping Use   Vaping status: Never Used  Substance Use Topics   Alcohol use: Yes    Comment: ocassionally   Drug use: No     Family Hx: The patient's family history includes Brain cancer in his brother; Breast cancer in his sister; Cervical cancer in his mother; Colon cancer in his mother; Heart attack in his father; Liver cancer in his brother; Stroke in his father;  Throat cancer in his brother. There is no history of Esophageal cancer, Rectal cancer, or Stomach cancer.  ROS:   Please see the history of present illness.     All other systems reviewed and are negative.   Labs/Other Tests and Data Reviewed:    Recent Labs: 12/10/2022: NT-Pro BNP 2,546 12/12/2022: TSH  1.595 01/28/2023: B Natriuretic Peptide 134.4 05/26/2023: ALT 17 06/05/2023: BUN 19; Creatinine, Ser 1.38; Hemoglobin 13.2; Platelets 240; Potassium 4.2; Sodium 135   Recent Lipid Panel Lab Results  Component Value Date/Time   CHOL  04/25/2009 01:55 AM    134        ATP III CLASSIFICATION:  <200     mg/dL   Desirable  409-811  mg/dL   Borderline High  >=914    mg/dL   High          TRIG 57 04/25/2009 01:55 AM   HDL 73 04/25/2009 01:55 AM   CHOLHDL 1.8 04/25/2009 01:55 AM   LDLCALC  04/25/2009 01:55 AM    50        Total Cholesterol/HDL:CHD Risk Coronary Heart Disease Risk Table                     Men   Women  1/2 Average Risk   3.4   3.3  Average Risk       5.0   4.4  2 X Average Risk   9.6   7.1  3 X Average Risk  23.4   11.0        Use the calculated Patient Ratio above and the CHD Risk Table to determine the patient's CHD Risk.        ATP III CLASSIFICATION (LDL):  <100     mg/dL   Optimal  782-956  mg/dL   Near or Above                    Optimal  130-159  mg/dL   Borderline  213-086  mg/dL   High  >578     mg/dL   Very High    Wt Readings from Last 3 Encounters:  08/10/23 147 lb 12.8 oz (67 kg)  06/05/23 143 lb (64.9 kg)  05/26/23 140 lb (63.5 kg)     Objective:    Vital Signs:  BP 120/72   Pulse 64   Ht 5\' 6"  (1.676 m)   Wt 147 lb 12.8 oz (67 kg)   SpO2 93%   BMI 23.86 kg/m   GEN: Well nourished, well developed in no acute distress HEENT: Normal NECK: No JVD; No carotid bruits LYMPHATICS: No lymphadenopathy CARDIAC:RRR, no murmurs, rubs, gallops RESPIRATORY:  Clear to auscultation without rales, wheezing or rhonchi  ABDOMEN: Soft, non-tender,  non-distended MUSCULOSKELETAL:  No edema; No deformity  SKIN: Warm and dry NEUROLOGIC:  Alert and oriented x 3 PSYCHIATRIC:  Normal affect  ASSESSMENT & PLAN:    1. OSA - The patient is tolerating PAP therapy well without any problems. The PAP download performed by his DME was personally reviewed and interpreted by me today and showed an AHI of 2.9 /hr on 9 cm H2O with 63 % compliance in using more than 4 hours nightly.  The patient has been using and benefiting from PAP use and will continue to benefit from therapy.  -he was noncompliant recently because his wife was in the hospital and he stayed with her and then had some mask issues but now is using it without any problems.    2.  HTN  -Bp controlled -Continue prescription drug management with amlodipine 5 mg daily, doxazosin 4 mg daily with as needed refills  Medication Adjustments/Labs and Tests Ordered: Current medicines are reviewed at length with the patient today.  Concerns regarding medicines are outlined above.  Tests Ordered: No orders of  the defined types were placed in this encounter.  Medication Changes: No orders of the defined types were placed in this encounter.   Disposition:  Follow up in 1 year(s)  Signed, Armanda Magic, MD  08/10/2023 2:17 PM    [facility]

## 2023-08-18 DIAGNOSIS — J189 Pneumonia, unspecified organism: Secondary | ICD-10-CM | POA: Diagnosis not present

## 2023-08-18 DIAGNOSIS — G4733 Obstructive sleep apnea (adult) (pediatric): Secondary | ICD-10-CM | POA: Diagnosis not present

## 2023-08-18 DIAGNOSIS — J984 Other disorders of lung: Secondary | ICD-10-CM | POA: Diagnosis not present

## 2023-08-18 DIAGNOSIS — R058 Other specified cough: Secondary | ICD-10-CM | POA: Diagnosis not present

## 2023-08-18 DIAGNOSIS — J309 Allergic rhinitis, unspecified: Secondary | ICD-10-CM | POA: Diagnosis not present

## 2023-08-19 NOTE — Progress Notes (Signed)
Cardiology Office Note   Date:  08/26/2023   ID:  KING PARRADO, DOB 28-Aug-1935, MRN [id number]  PCP:  Chilton Greathouse, MD  Cardiologist:   Parv Manthey [place], MD   Chief Complaint  Patient presents with   Congestive Heart Failure   Atrial Fibrillation      History of Present Illness: [name] is a 88 y.o. male who is seen for follow up. I care for his wife [name] and he is Dr [name] father in law. He has a history of HTN and OSA on CPAP. Prior Myoview in 2010 was normal. He had a cardiac cath in 2018 showing normal coronaries and normal right heart hemodynamics. Echo in 2018 showed mild MR-otherwise normal. He is followed by Dr Mayford Knife for OSA. Has restless leg syndrome. Prior arterial dopplers normal.   He was seen 3 weeks ago by Dr Felipa Eth for general physical and was doing well. About 9 days ago he developed worsening SOB and edema. Noted dry cough. Was seen back at Dr Vicente Males office and found to be in AFib with rate 110s. Was placed on Eliquis and metoprolol.  Lasix dose was increased to 80 mg daily from 40 mg daily. On follow up Monday creatinine had increased from 1.4 to 1.9 so lasix was reduced to 40 mg daily. CXR done. Was placed on antibiotics for possible PNA. Swelling has improved but he still complains of SOB at rest, orthopnea. Weight did go up to 152 but has improved. He denies any palpitations. Does note slight chest pain at times.   We had initially increased lasix to 40 mg in am and 20 mg in PM. Creatinine repeat was up to 2.1. Pro  BNP was elevated to 2546. BNP to 719. He was seen in office by Dr Gala Romney on March 1 and given IV Furoscix. He did have a good diuresis with this but he felt really wiped out with this. Bedside Echo showed normal EF. On March 5 he underwent TEE by Dr Jens Som. This showed normal LV function. Moderate bilatrial enlargement. Severe MR. Mild RV dysfunction. No thrombus. He subsequently had successful DCCV to NSR with single shock.   He had  early return of AFib and was symptomatic. He was started on Flecainide 50 mg bid. Initially converted on therapy but then had recurrence again. Flecainide was increased to 100 mg bid and he converted again. He then developed persistent bradycardia with pulse in the 40s. Metoprolol was discontinued with improvement.   Repeat Echo done in NSR showed normal LV function with only moderate MR.   Admitted  (05/26/23) had 2 episodes of abrupt syncope followed by confusion. Denies fall, seizure-like activity, palpitations. CT head showed pansinusitits but otherwise normal. MRI brain showed pansinusitis possible trace fluid in mastoid-neurology over read the studies as a curbside and it was felt ultimately that patient had a vestibular neuronitis/myelitis. Treated with steroids and abx. Referred for vestibular rehab. Seen in CHF clinic. Sed rate was normal. Event monitor was benign.   On follow up today he is doing well from a cardiac standpoint. Was diagnose with "walking PNA" last week and now on antibiotics. States breathing and cough are better. Still working out at [employer] twice a week. Still doing Neuro PT. No Afib. No swelling.  Past Medical History:  Diagnosis Date   Anemia    Arthritis    osteoarthritis  back   Celiac disease    Chronic kidney disease    Collagenous colitis  Diverticulosis    Enlarged prostate    followed by dr  Lynnae Prince   Esophageal dysmotility    Esophageal dysmotility    Esophageal stenosis    Essential hypertension, benign 03/29/2014   External hemorrhoids    External hemorrhoids    Full dentures    GERD (gastroesophageal reflux disease)    Hemorrhoids    Hiatal hernia    IBS (irritable bowel syndrome)    Internal hemorrhoids    OSA on CPAP 10/17/2018   Osteoporosis    Postoperative anemia due to acute blood loss 03/29/2014   Schatzki's ring    Sleep apnea    CPAP Machine   Ulcer     Past Surgical History:  Procedure Laterality Date   APPENDECTOMY  1963    CARDIOVERSION N/A 12/16/2022   Procedure: CARDIOVERSION;  Surgeon: Lewayne Bunting, MD;  Location: [facility] ENDOSCOPY;  Service: Cardiovascular;  Laterality: N/A;   ESOPHAGOGASTRODUODENOSCOPY  01/09/2022   EYE SURGERY Bilateral    cataract removal - implants   HERNIA REPAIR  2002   right inguinal    HIP SURGERY Left    INGUINAL HERNIA REPAIR  08/19/2011   Procedure: HERNIA REPAIR  left INGUINAL ADULT;  Surgeon: Adolph Pollack, MD;  Location: [facility] ORS;  Service: General;  Laterality: Left;   INTRAMEDULLARY (IM) NAIL INTERTROCHANTERIC Left 03/27/2014   Procedure: INTRAMEDULLARY (IM) NAIL INTERTROCHANTRIC;  Surgeon: Shelda Pal, MD;  Location: [facility] ORS;  Service: Orthopedics;  Laterality: Left;   RIGHT/LEFT HEART CATH AND CORONARY ANGIOGRAPHY N/A 03/12/2017   Procedure: Right/Left Heart Cath and Coronary Angiography;  Surgeon: Dolores Patty, MD;  Location: [facility] INVASIVE CV LAB;  Service: Cardiovascular;  Laterality: N/A;   TEE WITHOUT CARDIOVERSION N/A 12/16/2022   Procedure: TRANSESOPHAGEAL ECHOCARDIOGRAM (TEE);  Surgeon: Lewayne Bunting, MD;  Location: [facility] ENDOSCOPY;  Service: Cardiovascular;  Laterality: N/A;   UPPER GASTROINTESTINAL ENDOSCOPY  2008     Current Outpatient Medications  Medication Sig Dispense Refill   acetaminophen (TYLENOL) 500 MG tablet Take 1,000 mg by mouth as needed for mild pain or moderate pain.     albuterol (PROVENTIL HFA;VENTOLIN HFA) 108 (90 Base) MCG/ACT inhaler Inhale 2 puffs into the lungs every 6 (six) hours as needed for wheezing or shortness of breath.     amLODipine (NORVASC) 5 MG tablet Take 5 mg by mouth daily.     apixaban (ELIQUIS) 5 MG TABS tablet Take 1 tablet (5 mg total) by mouth 2 (two) times daily. 28 tablet 0   benzonatate (TESSALON) 100 MG capsule Take 100 mg by mouth 3 (three) times daily.     cefdinir (OMNICEF) 300 MG capsule Take 300 mg by mouth 2 (two) times daily.     diclofenac Sodium (VOLTAREN) 1 % GEL Apply 2 g topically 4 (four)  times daily as needed (pain.).     dicyclomine (BENTYL) 10 MG capsule Take 10 mg by mouth 2 (two) times daily as needed (abdominal pain/spasms.).     doxazosin (CARDURA) 8 MG tablet Take 4 mg by mouth daily.     ferrous sulfate 325 (65 FE) MG tablet Take 325 mg by mouth at bedtime.     flecainide (TAMBOCOR) 50 MG tablet Take 50 mg by mouth 2 (two) times daily. Per patient taking 2 tablets in the morning and taking 2 tablets at night     furosemide (LASIX) 20 MG tablet Take 1 tablet (20 mg total) by mouth daily.     gabapentin (NEURONTIN) 600 MG tablet  Take 600 mg by mouth at bedtime.     loratadine (CLARITIN) 10 MG tablet Take 10 mg by mouth in the morning.     magnesium oxide (MAG-OX) 400 (240 Mg) MG tablet Take 400 mg by mouth in the morning.     potassium chloride SA (KLOR-CON M) 20 MEQ tablet Take 1 tablet (20 mEq total) by mouth daily with supper. 90 tablet 3   sertraline (ZOLOFT) 50 MG tablet Take 25 mg by mouth at bedtime.     zolpidem (AMBIEN) 10 MG tablet Take 10 mg by mouth at bedtime as needed for sleep.     No current facility-administered medications for this visit.    Allergies:   Gluten meal and Wheat    Social History:  The patient  reports that he quit smoking about 42 years ago. His smoking use included cigarettes. He has never used smokeless tobacco. He reports current alcohol use. He reports that he does not use drugs.   Family History:  The patient's family history includes Brain cancer in his brother; Breast cancer in his sister; Cervical cancer in his mother; Colon cancer in his mother; Heart attack in his father; Liver cancer in his brother; Stroke in his father; Throat cancer in his brother.    ROS:  Please see the history of present illness.   Otherwise, review of systems are positive for none.   All other systems are reviewed and negative.    PHYSICAL EXAM: VS:  BP 98/68   Pulse 70   Ht 5\' 6"  (1.676 m)   Wt 147 lb (66.7 kg)   SpO2 97%   BMI 23.73 kg/m  ,  BMI Body mass index is 23.73 kg/m. GEN: Well nourished, well developed, in no acute distress HEENT: normal Neck: JVD flat  carotid bruits, or masses Cardiac: RRR gr 1/6 systolic murmur at the apex. No rubs, or gallops,no edema  Respiratory:  clear GI: soft, nontender, nondistended, + BS MS: no deformity or atrophy. Pedal pulse are good. No  edema. Skin: warm and dry, no rash Neuro:  Strength and sensation are intact Psych: euthymic mood, full affect   EKG:  EKG is not ordered today.   Recent Labs: 12/10/2022: NT-Pro BNP 2,546 12/12/2022: TSH 1.595 01/28/2023: B Natriuretic Peptide 134.4 05/26/2023: ALT 17 06/05/2023: BUN 19; Creatinine, Ser 1.38; Hemoglobin 13.2; Platelets 240; Potassium 4.2; Sodium 135   Dated 11/06/20: cholesterol 158, triglycerides 119, HDL 49, LDL 85, creatinine 1.5. otherwise CMET normal. TSH normal  Dated 11/12/21: cholesterol 169, triglycerides 100, HDL 50, LDL 99. LFTs normal.    Lipid Panel    Component Value Date/Time   CHOL  04/25/2009 0155    134        ATP III CLASSIFICATION:  <200     mg/dL   Desirable  161-096  mg/dL   Borderline High  >=045    mg/dL   High          TRIG 57 04/25/2009 0155   HDL 73 04/25/2009 0155   CHOLHDL 1.8 04/25/2009 0155   VLDL 11 04/25/2009 0155   LDLCALC  04/25/2009 0155    50        Total Cholesterol/HDL:CHD Risk Coronary Heart Disease Risk Table                     Men   Women  1/2 Average Risk   3.4   3.3  Average Risk       5.0  4.4  2 X Average Risk   9.6   7.1  3 X Average Risk  23.4   11.0        Use the calculated Patient Ratio above and the CHD Risk Table to determine the patient's CHD Risk.        ATP III CLASSIFICATION (LDL):  <100     mg/dL   Optimal  401-027  mg/dL   Near or Above                    Optimal  130-159  mg/dL   Borderline  253-664  mg/dL   High  >403     mg/dL   Very High      Wt Readings from Last 3 Encounters:  08/26/23 147 lb (66.7 kg)  08/10/23 147 lb 12.8 oz (67 kg)   06/05/23 143 lb (64.9 kg)      Other studies Reviewed: Additional studies/ records that were reviewed today include:   Cardiac cath 03/12/17:  Right/Left Heart Cath and Coronary Angiography   Conclusion    The left ventricular ejection fraction is 55-65% by visual estimate.   Findings:   RA = 2 RV = 24/1 PA = 22/3 (13) PCW = 3 Fick cardiac output/index = 5.9/3.4 PVR = 1.7 WU Ao sat = 97% PA sat = 70%, 74%   Assessment: 1. Normal hemodynamics 2. EF 60% 3. Normal coronary arteries   Plan/Discussion:   Normal cath. Continue medical management.    Arvilla Meres, MD  11:07 AM   Echo 03/19/17: Study Conclusions   - Left ventricle: The cavity size was normal. Systolic function was    normal. The estimated ejection fraction was in the range of 55%    to 60%. Wall motion was normal; there were no regional wall    motion abnormalities.  - Mitral valve: There was mild regurgitation.  - Right atrium: Cannot r/o RA cor triatriatom should not be of any    clinical significance.  - Atrial septum: No defect or patent foramen ovale was identified.  - Pulmonary arteries: PA peak pressure: 34 mm Hg (S).   LE dopplers 06/11/21: Summary:  Right: Resting right ankle-brachial index indicates noncompressible right  lower extremity arteries. The right toe-brachial index is normal. Brisk,  multiphasic waveforms from the groin to ankle consistent with no evidence  of significant peripheral  arterial disease.   Left: Resting left ankle-brachial index is within normal range. No  evidence of significant left lower extremity arterial disease. The left  toe-brachial index is mildly abnormal. Brisk, multiphasic waveforms from  the groin to ankle consistent with no  evidence of significant peripheral arterial disease.     *See table(s) above for measurements and observations.   Echo 02/17/23: IMPRESSIONS     1. Left ventricular ejection fraction, by estimation, is 55 to 60%. The   left ventricle has normal function. The left ventricle has no regional  wall motion abnormalities. Left ventricular diastolic parameters are  consistent with Grade I diastolic  dysfunction (impaired relaxation).   2. Right ventricular systolic function is normal. The right ventricular  size is mildly enlarged. There is mildly elevated pulmonary artery  systolic pressure. The estimated right ventricular systolic pressure is  37.6 mmHg.   3. Left atrial size was moderately dilated.   4. Right atrial size was moderately dilated.   5. The mitral valve is normal in structure. Mild to moderate mitral valve  regurgitation. No evidence of mitral stenosis.  6. The aortic valve is tricuspid. There is mild calcification of the  aortic valve. There is mild thickening of the aortic valve. Aortic valve  regurgitation is not visualized. Aortic valve sclerosis is present, with  no evidence of aortic valve stenosis.   7. The inferior vena cava is dilated in size with >50% respiratory  variability, suggesting right atrial pressure of 8 mmHg.   Comparison(s): Changes from prior study are noted. Compared to TEE from  12/16/22, MR improved, now in sinus rhythm.   Event monitor 07/06/23: 1. Sinus rhythm - min HR of 50 bpm, max HR of 126 bpm, and avg HR of 68 bpm.  2. First Degree AV Block was present.  3. Three runs of Supraventricular Tachycardia occurred, the run with the fastest interval lasting 4 beats with a max rate of 126 bpm, the longest lasting 7 beats with an avg rate of 98 bpm.  4. Rare PACs and PVCs 5. AF was not detected.  6. 13 patient-triggered events associated with sinus rhythm   Arvilla Meres, MD  10:41 PM  ASSESSMENT AND PLAN:  1.  Atrial fibrillation new onset associated with acute diastolic CHF. On Eliquis. TEE showed  biatrial enlargement and severe MR. Symptoms  improved with restoration of NSR. He had Early return of Afib post DCCV. Converted on Flecainide 50 mg bid but then  had recurrence again. Converted on 100 mg bid and has maintained NSR as long as he is taking it.  Metoprolol discontinued due to bradycardia. Event monitor in September without AFib or any serious arrhythmia. Continue current therapy.  2. Chronic  diastolic CHF. Volume status is normal. No edema. Continue lasix 20mg  daily 3.  HTN controlled.  3. OSA on CPAP 4. MR. Was severe on TEE but exam today is really unimpressive. Repeat Echo in NSR showed only moderate MR. Will continue medical management.  5. PNA. Per PCP  Current medicines are reviewed at length with the patient today.  The patient does not have concerns regarding medicines.  No change in therapy  Labs/ tests ordered today include:   None  Follow up 6 months   Signed, Antanisha Mohs [place], MD  08/26/2023 10:17 AM    [facility] [address] [place], [place], [address] Phone [phone], Fax [phone]

## 2023-08-24 ENCOUNTER — Telehealth: Payer: Self-pay | Admitting: Cardiology

## 2023-08-24 DIAGNOSIS — I10 Essential (primary) hypertension: Secondary | ICD-10-CM

## 2023-08-24 DIAGNOSIS — G4733 Obstructive sleep apnea (adult) (pediatric): Secondary | ICD-10-CM

## 2023-08-24 DIAGNOSIS — I5032 Chronic diastolic (congestive) heart failure: Secondary | ICD-10-CM

## 2023-08-24 NOTE — Telephone Encounter (Signed)
Patient called to follow-up on getting his CPAP mask replaced.

## 2023-08-24 NOTE — Telephone Encounter (Signed)
Cpap supplies and medium mask ordered

## 2023-08-26 ENCOUNTER — Ambulatory Visit: Payer: Medicare HMO | Attending: Cardiology | Admitting: Cardiology

## 2023-08-26 ENCOUNTER — Encounter: Payer: Self-pay | Admitting: Cardiology

## 2023-08-26 VITALS — BP 98/68 | HR 70 | Ht 66.0 in | Wt 147.0 lb

## 2023-08-26 DIAGNOSIS — I340 Nonrheumatic mitral (valve) insufficiency: Secondary | ICD-10-CM | POA: Diagnosis not present

## 2023-08-26 DIAGNOSIS — I10 Essential (primary) hypertension: Secondary | ICD-10-CM

## 2023-08-26 DIAGNOSIS — I5032 Chronic diastolic (congestive) heart failure: Secondary | ICD-10-CM

## 2023-08-26 DIAGNOSIS — I480 Paroxysmal atrial fibrillation: Secondary | ICD-10-CM

## 2023-08-26 MED ORDER — APIXABAN 5 MG PO TABS: 5.0000 mg | ORAL_TABLET | Freq: Two times a day (BID) | ORAL | Status: DC

## 2023-08-26 NOTE — Addendum Note (Signed)
Addended by: Neoma Laming on: 08/26/2023 11:09 AM   Modules accepted: Orders

## 2023-08-26 NOTE — Patient Instructions (Signed)
Medication Instructions:  No changes *If you need a refill on your cardiac medications before your next appointment, please call your pharmacy*   Lab Work: None  If you have labs (blood work) drawn today and your tests are completely normal, you will receive your results only by: MyChart Message (if you have MyChart) OR A paper copy in the mail If you have any lab test that is abnormal or we need to change your treatment, we will call you to review the results.   Testing/Procedures: None    Follow-Up: At [facility], you and your health needs are our priority.  As part of our continuing mission to provide you with exceptional heart care, we have created designated Provider Care Teams.  These Care Teams include your primary Cardiologist (physician) and Advanced Practice Providers (APPs -  Physician Assistants and Nurse Practitioners) who all work together to provide you with the care you need, when you need it.    Your next appointment:   6 month(s)  Provider:   Peter [place], MD

## 2023-08-31 ENCOUNTER — Other Ambulatory Visit: Payer: Self-pay

## 2023-08-31 MED ORDER — APIXABAN 5 MG PO TABS: 5.0000 mg | ORAL_TABLET | Freq: Two times a day (BID) | ORAL | 0 refills | Status: DC

## 2023-09-08 DIAGNOSIS — J189 Pneumonia, unspecified organism: Secondary | ICD-10-CM | POA: Diagnosis not present

## 2023-09-08 DIAGNOSIS — I509 Heart failure, unspecified: Secondary | ICD-10-CM | POA: Diagnosis not present

## 2023-09-17 ENCOUNTER — Ambulatory Visit: Payer: Medicare HMO | Attending: Internal Medicine | Admitting: Internal Medicine

## 2023-09-17 VITALS — BP 127/67 | HR 72 | Wt 150.0 lb

## 2023-09-17 DIAGNOSIS — Z79899 Other long term (current) drug therapy: Secondary | ICD-10-CM | POA: Diagnosis not present

## 2023-09-17 DIAGNOSIS — N1832 Chronic kidney disease, stage 3b: Secondary | ICD-10-CM | POA: Diagnosis not present

## 2023-09-17 DIAGNOSIS — Z7901 Long term (current) use of anticoagulants: Secondary | ICD-10-CM | POA: Insufficient documentation

## 2023-09-17 DIAGNOSIS — G4733 Obstructive sleep apnea (adult) (pediatric): Secondary | ICD-10-CM | POA: Diagnosis not present

## 2023-09-17 DIAGNOSIS — I5032 Chronic diastolic (congestive) heart failure: Secondary | ICD-10-CM | POA: Insufficient documentation

## 2023-09-17 DIAGNOSIS — I480 Paroxysmal atrial fibrillation: Secondary | ICD-10-CM | POA: Diagnosis not present

## 2023-09-17 DIAGNOSIS — I340 Nonrheumatic mitral (valve) insufficiency: Secondary | ICD-10-CM | POA: Insufficient documentation

## 2023-09-17 DIAGNOSIS — I130 Hypertensive heart and chronic kidney disease with heart failure and stage 1 through stage 4 chronic kidney disease, or unspecified chronic kidney disease: Secondary | ICD-10-CM | POA: Diagnosis not present

## 2023-09-17 DIAGNOSIS — G2581 Restless legs syndrome: Secondary | ICD-10-CM | POA: Diagnosis not present

## 2023-09-17 DIAGNOSIS — R55 Syncope and collapse: Secondary | ICD-10-CM | POA: Insufficient documentation

## 2023-09-17 NOTE — Progress Notes (Signed)
ADVANCED HF CLINIC NOTE  Referring Physician: Chilton Greathouse, MD Primary Care: Charles Greathouse, MD Primary Cardiologist: Charles [place], MD  HPI:  [name] is a 88 y.o. male who is seen for follow up. I care for his wife [name] and he is Dr [name] father in law. He has a history of HTN and OSA on CPAP. Prior Myoview in 2010 was normal. He had a cardiac cath in 2018 showing normal coronaries and normal right heart hemodynamics. Echo in 2018 showed mild MR-otherwise normal. He is followed by Dr [name] for OSA. Has restless leg syndrome. Prior arterial dopplers normal.    Developed new onset AF in 2/24  Had DC-CV 12/16/22 lasted only a few days. Loaded Flecainide and converted. Had recurrent AF on Flecainide 50 bid. Now maintaining NSR on 100 bid   In 8.24 had syncope. Brain MRI ok. We placed Zio  1. Sinus rhythm - min HR of 50 bpm, max HR of 126 bpm, and avg HR of 68 bpm.  2. First Degree AV Block was present.  3. Three runs of Supraventricular Tachycardia occurred, the run with the fastest interval lasting 4 beats with a max rate of 126 bpm, the longest lasting 7 beats with an avg rate of 98 bpm.  4. Rare PACs and PVCs 5. AF was not detected.  6. 13 patient-triggered events associated with sinus rhythm  Here for f/u. Feels good. Breathing ok. No edema. No palpitations. No bleeding on Eliquis. On flecainide 100 bid    TEE 3/24 EF 55-60% mod-sev MR   Has repeat echo 02/17/23   Past Medical History:  Diagnosis Date   Anemia    Arthritis    osteoarthritis  back   Celiac disease    Chronic kidney disease    Collagenous colitis    Diverticulosis    Enlarged prostate    followed by dr  [name]   Esophageal dysmotility    Esophageal dysmotility    Esophageal stenosis    Essential hypertension, benign 03/29/2014   External hemorrhoids    External hemorrhoids    Full dentures    GERD (gastroesophageal reflux disease)    Hemorrhoids    Hiatal hernia    IBS (irritable  bowel syndrome)    Internal hemorrhoids    OSA on CPAP 10/17/2018   Osteoporosis    Postoperative anemia due to acute blood loss 03/29/2014   Schatzki's ring    Sleep apnea    CPAP Machine   Ulcer     Current Outpatient Medications  Medication Sig Dispense Refill   acetaminophen (TYLENOL) 500 MG tablet Take 1,000 mg by mouth as needed for mild pain or moderate pain.     albuterol (PROVENTIL HFA;VENTOLIN HFA) 108 (90 Base) MCG/ACT inhaler Inhale 2 puffs into the lungs every 6 (six) hours as needed for wheezing or shortness of breath.     amLODipine (NORVASC) 5 MG tablet Take 5 mg by mouth daily.     apixaban (ELIQUIS) 5 MG TABS tablet Take 1 tablet (5 mg total) by mouth 2 (two) times daily. 28 tablet 0   diclofenac Sodium (VOLTAREN) 1 % GEL Apply 2 g topically 4 (four) times daily as needed (pain.).     doxazosin (CARDURA) 8 MG tablet Take 4 mg by mouth daily.     ferrous sulfate 325 (65 FE) MG tablet Take 325 mg by mouth at bedtime.     flecainide (TAMBOCOR) 50 MG tablet Take 50 mg by mouth 2 (two) times  daily. Per patient taking 2 tablets in the morning and taking 2 tablets at night     furosemide (LASIX) 20 MG tablet Take 1 tablet (20 mg total) by mouth daily.     gabapentin (NEURONTIN) 600 MG tablet Take 600 mg by mouth at bedtime.     loratadine (CLARITIN) 10 MG tablet Take 10 mg by mouth in the morning.     magnesium oxide (MAG-OX) 400 (240 Mg) MG tablet Take 400 mg by mouth in the morning.     potassium chloride SA (KLOR-CON M) 20 MEQ tablet Take 1 tablet (20 mEq total) by mouth daily with supper. 90 tablet 3   sertraline (ZOLOFT) 50 MG tablet Take 25 mg by mouth at bedtime.     zolpidem (AMBIEN) 10 MG tablet Take 10 mg by mouth at bedtime as needed for sleep.     No current facility-administered medications for this visit.    Allergies  Allergen Reactions   Gluten Meal     Celiac Disease   Wheat Other (See Comments)    Celiac Disease      Social History   Socioeconomic  History   Marital status: Married    Spouse name: Not on file   Number of children: 3   Years of education: Not on file   Highest education level: Not on file  Occupational History   Occupation: retired  Tobacco Use   Smoking status: Former    Current packs/day: 0.00    Types: Cigarettes    Quit date: 08/11/1981    Years since quitting: 42.1   Smokeless tobacco: Never  Vaping Use   Vaping status: Never Used  Substance and Sexual Activity   Alcohol use: Yes    Comment: ocassionally   Drug use: No   Sexual activity: Not on file  Other Topics Concern   Not on file  Social History Narrative   Not on file   Social Determinants of Health   Financial Resource Strain: Not on file  Food Insecurity: No Food Insecurity (05/27/2023)   Hunger Vital Sign    Worried About Running Out of Food in the Last Year: Never true    Ran Out of Food in the Last Year: Never true  Transportation Needs: No Transportation Needs (05/27/2023)   PRAPARE - Administrator, Civil Service (Medical): No    Lack of Transportation (Non-Medical): No  Physical Activity: Not on file  Stress: Not on file  Social Connections: Not on file  Intimate Partner Violence: Not At Risk (05/27/2023)   Humiliation, Afraid, Rape, and Kick questionnaire    Fear of Current or Ex-Partner: No    Emotionally Abused: No    Physically Abused: No    Sexually Abused: No      Family History  Problem Relation Age of Onset   Cervical cancer Mother    Colon cancer Mother    Stroke Father    Heart attack Father    Breast cancer Sister    Throat cancer Brother    Liver cancer Brother    Brain cancer Brother    Esophageal cancer Neg Hx    Rectal cancer Neg Hx    Stomach cancer Neg Hx     Vitals:   09/17/23 1440  BP: 127/67  Pulse: 72  SpO2: 94%  Weight: 150 lb (68 kg)     PHYSICAL EXAM: General:  Well appearing. No resp difficulty HEENT: normal Neck: supple. no JVD. Carotids 2+ bilat; no bruits.  No  lymphadenopathy or thryomegaly appreciated. Cor: PMI nondisplaced. Regular rate & rhythm. No rubs, gallops or murmurs. Lungs: clear Abdomen: soft, nontender, nondistended. No hepatosplenomegaly. No bruits or masses. Good bowel sounds. Extremities: no cyanosis, clubbing, rash, edema Neuro: alert & orientedx3, cranial nerves grossly intact. moves all 4 extremities w/o difficulty. Affect pleasant   ECG: NSR 63 1 AVB 234 ms. QRS 106 No ST-T wave abnormalities.    ASSESSMENT & PLAN:  1. Syncope/pre-syncope - Resolved  - Zio ok   2. Chronic diastolic HF in setting of AF - TEE 3/24 EF 55-60% mod-sev MR - much improved with return of NSR  - Volume status looks good  3. PAF - new-onset 2/24 - now maintaining NSR on flecainide 100 bid.  - Continue Eliquis - Zio patch as above with no recurrent AF  4. OSA - continue CPAP  5. CKD 3b - baseline Scr 1.5-1.6 - recheck today  - Consider switching lasix to low-dose Farxiga   6. HTN - Blood pressure well controlled. Continue current regimen.  7. Mitral regurgitation - I reviewed TEE. Seems more moderate range - Echo 5/24 EF 55-60% mild to mod MR - follow   Arvilla Meres, MD  3:05 PM

## 2023-09-17 NOTE — Patient Instructions (Signed)
Medication Changes:  None, continue current medications  Lab Work:  Labs done today, your results will be available in MyChart, we will contact you for abnormal readings.   Special Instructions // Education:  Do the following things EVERYDAY: Weigh yourself in the morning before breakfast. Write it down and keep it in a log. Take your medicines as prescribed Eat low salt foods--Limit salt (sodium) to 2000 mg per day.  Stay as active as you can everyday Limit all fluids for the day to less than 2 liters   Follow-Up in: 4 months (April 2025), **WE WILL CALL YOU CLOSER TO THIS TIME TO SCHEDULE    If you have any questions or concerns before your next appointment please send [place] a message through mychart or call our office at [phone] Monday-Friday 8 am-5 pm.   If you have an urgent need after hours on the weekend please call your Primary Cardiologist or the [facility] in [place] at [phone].   At the [facility], you and your health needs are our priority. We have a designated team specialized in the treatment of Heart Failure. This Care Team includes your primary Heart Failure Specialized Cardiologist (physician), Advanced Practice Providers (APPs- Physician Assistants and Nurse Practitioners), and Pharmacist who all work together to provide you with the care you need, when you need it.   You may see any of the following providers on your designated Care Team at your next follow up:  Dr. Arvilla Meres Dr. Marca Ancona Dr. Dorthula Nettles Dr. Theresia Bough Tonye Becket, NP Robbie Lis, [place] [address] [place], [place] Brynda Peon, NP [place] Lee, NP Clarisa Kindred, NP Enos Fling, PharmD

## 2023-09-18 LAB — CBC
Hematocrit: 36.9 % — ABNORMAL LOW (ref 37.5–51.0)
Hemoglobin: 11.7 g/dL — ABNORMAL LOW (ref 13.0–17.7)
MCH: 31.2 pg (ref 26.6–33.0)
MCHC: 31.7 g/dL (ref 31.5–35.7)
MCV: 98 fL — ABNORMAL HIGH (ref 79–97)
Platelets: 198 10*3/uL (ref 150–450)
RBC: 3.75 x10E6/uL — ABNORMAL LOW (ref 4.14–5.80)
RDW: 13 % (ref 11.6–15.4)
WBC: 4.8 10*3/uL (ref 3.4–10.8)

## 2023-09-18 LAB — BASIC METABOLIC PANEL
BUN/Creatinine Ratio: 13 (ref 10–24)
BUN: 20 mg/dL (ref 8–27)
CO2: 26 mmol/L (ref 20–29)
Calcium: 9 mg/dL (ref 8.6–10.2)
Chloride: 102 mmol/L (ref 96–106)
Creatinine, Ser: 1.54 mg/dL — ABNORMAL HIGH (ref 0.76–1.27)
Glucose: 91 mg/dL (ref 70–99)
Potassium: 4.6 mmol/L (ref 3.5–5.2)
Sodium: 144 mmol/L (ref 134–144)
eGFR: 44 mL/min/{1.73_m2} — ABNORMAL LOW (ref >59)

## 2023-09-18 LAB — BRAIN NATRIURETIC PEPTIDE: BNP: 59.3 pg/mL (ref 0.0–100.0)

## 2023-09-21 ENCOUNTER — Telehealth (HOSPITAL_COMMUNITY): Payer: Self-pay

## 2023-09-21 DIAGNOSIS — I5032 Chronic diastolic (congestive) heart failure: Secondary | ICD-10-CM

## 2023-09-21 MED ORDER — FUROSEMIDE 20 MG PO TABS: ORAL_TABLET | ORAL | 11 refills | Status: AC

## 2023-09-21 NOTE — Telephone Encounter (Signed)
Patient's labs placed and appointment scheduled. In addition, patient's lasix has been changed to as needed (PRN) pt's chart also updated to reflect the change. Spoke to patient and his daughter Synetta Fail Pt aware, agreeable, and verbalized understanding.

## 2023-09-21 NOTE — Telephone Encounter (Signed)
-----   Message from Arvilla Meres sent at 09/20/2023  3:51 PM EST ----- Scr trending up. Stop lasix (use only PRN)   Repeat BMET 2 weeks (I let his daughter know)

## 2023-10-02 ENCOUNTER — Encounter (HOSPITAL_COMMUNITY): Payer: Medicare HMO | Admitting: Internal Medicine

## 2023-10-05 ENCOUNTER — Ambulatory Visit (HOSPITAL_COMMUNITY)
Admission: RE | Admit: 2023-10-05 | Discharge: 2023-10-05 | Disposition: A | Discharge status: Home / Self Care | Payer: Medicare HMO | Source: Ambulatory Visit | Attending: Cardiology | Admitting: Cardiology

## 2023-10-05 DIAGNOSIS — I5032 Chronic diastolic (congestive) heart failure: Secondary | ICD-10-CM | POA: Diagnosis not present

## 2023-10-05 LAB — BASIC METABOLIC PANEL
Anion gap: 6 (ref 5–15)
BUN: 18 mg/dL (ref 8–23)
CO2: 27 mmol/L (ref 22–32)
Calcium: 9.1 mg/dL (ref 8.9–10.3)
Chloride: 105 mmol/L (ref 98–111)
Creatinine, Ser: 1.2 mg/dL (ref 0.61–1.24)
GFR, Estimated: 59 mL/min — ABNORMAL LOW (ref >60)
Glucose, Bld: 97 mg/dL (ref 70–99)
Potassium: 4 mmol/L (ref 3.5–5.1)
Sodium: 138 mmol/L (ref 135–145)

## 2023-10-26 DIAGNOSIS — R062 Wheezing: Secondary | ICD-10-CM | POA: Diagnosis not present

## 2023-10-26 DIAGNOSIS — J029 Acute pharyngitis, unspecified: Secondary | ICD-10-CM | POA: Diagnosis not present

## 2023-10-26 DIAGNOSIS — Z1152 Encounter for screening for COVID-19: Secondary | ICD-10-CM | POA: Diagnosis not present

## 2023-10-26 DIAGNOSIS — J189 Pneumonia, unspecified organism: Secondary | ICD-10-CM | POA: Diagnosis not present

## 2023-10-26 DIAGNOSIS — G4733 Obstructive sleep apnea (adult) (pediatric): Secondary | ICD-10-CM | POA: Diagnosis not present

## 2023-10-26 DIAGNOSIS — I4891 Unspecified atrial fibrillation: Secondary | ICD-10-CM | POA: Diagnosis not present

## 2023-10-26 DIAGNOSIS — R0981 Nasal congestion: Secondary | ICD-10-CM | POA: Diagnosis not present

## 2023-10-26 DIAGNOSIS — R058 Other specified cough: Secondary | ICD-10-CM | POA: Diagnosis not present

## 2023-11-04 ENCOUNTER — Telehealth: Payer: Self-pay | Admitting: Cardiology

## 2023-11-04 NOTE — Telephone Encounter (Signed)
Patients daughter dropped off assistance form. In providers box.

## 2023-11-06 ENCOUNTER — Other Ambulatory Visit: Payer: Self-pay

## 2023-11-06 MED ORDER — APIXABAN 5 MG PO TABS: 5.0000 mg | ORAL_TABLET | Freq: Two times a day (BID) | ORAL | 3 refills | Status: DC

## 2023-11-06 NOTE — Telephone Encounter (Signed)
Patient assistance paperwork for Eliquis completed and faxed to fax # [phone].

## 2023-11-09 DIAGNOSIS — J189 Pneumonia, unspecified organism: Secondary | ICD-10-CM | POA: Diagnosis not present

## 2023-11-09 DIAGNOSIS — R197 Diarrhea, unspecified: Secondary | ICD-10-CM | POA: Diagnosis not present

## 2023-11-11 ENCOUNTER — Telehealth: Payer: Self-pay | Admitting: Cardiology

## 2023-11-11 ENCOUNTER — Other Ambulatory Visit: Payer: Self-pay | Admitting: Cardiology

## 2023-11-11 NOTE — Telephone Encounter (Signed)
Called and spoke to patient's wife. Verified patient's name and DOB. She report patient had chest pressure last night. She stated he woke up this morning and told her he had chest pressure during the night but could not wake himself up. He told her he didn't know if he was dreaming or not. Patient states he is fine today and voice no complaints. He deny chest pain/pressure, SOB, dizziness or any other symptoms at this time. His BP is 154/73 HR 74. Advised to monitor for now and if he complains again or has new symptoms to call back or have patient seen in the ED. She verbalized understanding and agree.

## 2023-11-11 NOTE — Telephone Encounter (Signed)
[place], Peter M, MD  Cv Div Nl Triage; Charna Elizabeth, LPN7 minutes ago (11:53 AM)    Agree just monitor for now. Let us know if symptoms recur  Peter [place] MD, [facility]    Noted  Patient aware

## 2023-11-11 NOTE — Telephone Encounter (Signed)
Pt spouse states pt had some chest pressure last night. She states he couldn't wake up during that time. He denies any other symptoms and states he is fine now. Pt spouse asked is there anything they need to do. Please advise.

## 2023-11-13 DIAGNOSIS — R058 Other specified cough: Secondary | ICD-10-CM | POA: Diagnosis not present

## 2023-11-16 DIAGNOSIS — R197 Diarrhea, unspecified: Secondary | ICD-10-CM | POA: Diagnosis not present

## 2023-11-19 DIAGNOSIS — R197 Diarrhea, unspecified: Secondary | ICD-10-CM | POA: Diagnosis not present

## 2023-11-19 DIAGNOSIS — E876 Hypokalemia: Secondary | ICD-10-CM | POA: Diagnosis not present

## 2023-11-19 DIAGNOSIS — J189 Pneumonia, unspecified organism: Secondary | ICD-10-CM | POA: Diagnosis not present

## 2023-11-19 DIAGNOSIS — I4891 Unspecified atrial fibrillation: Secondary | ICD-10-CM | POA: Diagnosis not present

## 2023-11-19 DIAGNOSIS — I129 Hypertensive chronic kidney disease with stage 1 through stage 4 chronic kidney disease, or unspecified chronic kidney disease: Secondary | ICD-10-CM | POA: Diagnosis not present

## 2023-11-25 ENCOUNTER — Telehealth: Payer: Self-pay | Admitting: Internal Medicine

## 2023-11-25 NOTE — Telephone Encounter (Signed)
Inbound call from patients daughter stating that patent has been experiencing diarrhea for the last week and a half and he seems to be getting weaker. Patient was scheduled for an appointment with Mike Gip on 2/20 at 2:30 and is requesting a call to discuss recommendations in the meantime and to see if there anyway possible to be seen sooner. Please advise.

## 2023-11-25 NOTE — Telephone Encounter (Signed)
Pts daughter concerned, pt continues to have diarrhea. He is also not able to eat much due to having some teeth extracted to get dentures. Pt scheduled to see Deanna May NP tomorrow at 10:30am. Daughter aware of the appt.

## 2023-11-26 ENCOUNTER — Other Ambulatory Visit: Payer: Self-pay

## 2023-11-26 ENCOUNTER — Encounter: Payer: Self-pay | Admitting: Gastroenterology

## 2023-11-26 ENCOUNTER — Ambulatory Visit (INDEPENDENT_AMBULATORY_CARE_PROVIDER_SITE_OTHER): Payer: Medicare HMO | Admitting: Gastroenterology

## 2023-11-26 ENCOUNTER — Other Ambulatory Visit: Payer: Medicare HMO

## 2023-11-26 ENCOUNTER — Telehealth: Payer: Self-pay

## 2023-11-26 VITALS — BP 128/58 | HR 65 | Ht 66.0 in | Wt 145.0 lb

## 2023-11-26 DIAGNOSIS — K921 Melena: Secondary | ICD-10-CM

## 2023-11-26 DIAGNOSIS — K219 Gastro-esophageal reflux disease without esophagitis: Secondary | ICD-10-CM

## 2023-11-26 DIAGNOSIS — R1319 Other dysphagia: Secondary | ICD-10-CM

## 2023-11-26 DIAGNOSIS — R1310 Dysphagia, unspecified: Secondary | ICD-10-CM

## 2023-11-26 DIAGNOSIS — R195 Other fecal abnormalities: Secondary | ICD-10-CM

## 2023-11-26 DIAGNOSIS — R109 Unspecified abdominal pain: Secondary | ICD-10-CM

## 2023-11-26 DIAGNOSIS — D509 Iron deficiency anemia, unspecified: Secondary | ICD-10-CM | POA: Diagnosis not present

## 2023-11-26 DIAGNOSIS — R197 Diarrhea, unspecified: Secondary | ICD-10-CM

## 2023-11-26 DIAGNOSIS — D649 Anemia, unspecified: Secondary | ICD-10-CM

## 2023-11-26 LAB — COMPREHENSIVE METABOLIC PANEL
ALT: 10 U/L (ref 0–53)
AST: 16 U/L (ref 0–37)
Albumin: 4.2 g/dL (ref 3.5–5.2)
Alkaline Phosphatase: 57 U/L (ref 39–117)
BUN: 16 mg/dL (ref 6–23)
CO2: 25 meq/L (ref 19–32)
Calcium: 9.2 mg/dL (ref 8.4–10.5)
Chloride: 100 meq/L (ref 96–112)
Creatinine, Ser: 1.37 mg/dL (ref 0.40–1.50)
GFR: 46.68 mL/min — ABNORMAL LOW (ref >60.00)
Glucose, Bld: 90 mg/dL (ref 70–99)
Potassium: 3.8 meq/L (ref 3.5–5.1)
Sodium: 136 meq/L (ref 135–145)
Total Bilirubin: 0.3 mg/dL (ref 0.2–1.2)
Total Protein: 7.5 g/dL (ref 6.0–8.3)

## 2023-11-26 LAB — CBC WITH DIFFERENTIAL/PLATELET
Basophils Absolute: 0 10*3/uL (ref 0.0–0.1)
Basophils Relative: 0.7 % (ref 0.0–3.0)
Eosinophils Absolute: 0.2 10*3/uL (ref 0.0–0.7)
Eosinophils Relative: 2.7 % (ref 0.0–5.0)
HCT: 39.3 % (ref 39.0–52.0)
Hemoglobin: 12.8 g/dL — ABNORMAL LOW (ref 13.0–17.0)
Lymphocytes Relative: 16.7 % (ref 12.0–46.0)
Lymphs Abs: 1.2 10*3/uL (ref 0.7–4.0)
MCHC: 32.5 g/dL (ref 30.0–36.0)
MCV: 96.6 fL (ref 78.0–100.0)
Monocytes Absolute: 0.9 10*3/uL (ref 0.1–1.0)
Monocytes Relative: 13.2 % — ABNORMAL HIGH (ref 3.0–12.0)
Neutro Abs: 4.7 10*3/uL (ref 1.4–7.7)
Neutrophils Relative %: 66.7 % (ref 43.0–77.0)
Platelets: 233 10*3/uL (ref 150.0–400.0)
RBC: 4.07 Mil/uL — ABNORMAL LOW (ref 4.22–5.81)
RDW: 14.6 % (ref 11.5–15.5)
WBC: 7.1 10*3/uL (ref 4.0–10.5)

## 2023-11-26 LAB — IBC + FERRITIN
Ferritin: 120.8 ng/mL (ref 22.0–322.0)
Iron: 22 ug/dL — ABNORMAL LOW (ref 42–165)
Saturation Ratios: 7.6 % — ABNORMAL LOW (ref 20.0–50.0)
TIBC: 289.8 ug/dL (ref 250.0–450.0)
Transferrin: 207 mg/dL — ABNORMAL LOW (ref 212.0–360.0)

## 2023-11-26 LAB — MAGNESIUM: Magnesium: 2.2 mg/dL (ref 1.5–2.5)

## 2023-11-26 NOTE — Telephone Encounter (Signed)
[facility] Pre-operative Risk Assessment     Request for surgical clearance:     Endoscopy Procedure  What type of surgery is being performed?     Endoscopy/Colonoscopy  When is this surgery scheduled?     12/07/23  What type of clearance is required ?   Pharmacy  Are there any medications that need to be held prior to surgery and how long? Eliquis 2 days hold  Practice name and name of physician performing surgery?      [facility]  What is your office phone and fax number?      Phone- [phone]  Fax- [phone]  Anesthesia type (None, local, MAC, general) ?       MAC   Please route your response to [facility]

## 2023-11-26 NOTE — Patient Instructions (Addendum)
Your provider has requested that you go to the basement level for lab work before leaving today. Press "B" on the elevator. The lab is located at the first door on the left as you exit the elevator.  You have been scheduled for an endoscopy and colonoscopy. Please follow the written instructions given to you at your visit today.  If you use inhalers (even only as needed), please bring them with you on the day of your procedure.  DO NOT TAKE 7 DAYS PRIOR TO TEST- Trulicity (dulaglutide) Ozempic, Wegovy (semaglutide) Mounjaro (tirzepatide) Bydureon Bcise (exanatide extended release)  DO NOT TAKE 1 DAY PRIOR TO YOUR TEST Rybelsus (semaglutide) Adlyxin (lixisenatide) Victoza (liraglutide) Byetta (exanatide) ___________________________________________________________________________  Colonoscopy prep was given to you in office  You will be contacted by our office prior to your procedure for directions on holding your Eliquis.  If you do not hear from our office 1 week prior to your scheduled procedure, please call [phone] to discuss.  Please purchase the following medications over the counter and take as directed: Imodium 2 tablets twice daily  Stop taking  Iron supplements   If your blood pressure at your visit was 140/90 or greater, please contact your primary care physician to follow up on this.  _______________________________________________________  If you are age 64 or older, your body mass index should be between 23-30. Your Body mass index is 23.4 kg/m. If this is out of the aforementioned range listed, please consider follow up with your Primary Care Provider.  If you are age 64 or younger, your body mass index should be between 19-25. Your Body mass index is 23.4 kg/m. If this is out of the aformentioned range listed, please consider follow up with your Primary Care Provider.   ________________________________________________________  The [place] [facility] providers would  like to encourage you to use [facility] to communicate with providers for non-urgent requests or questions.  Due to long hold times on the telephone, sending your provider a message by [facility] may be a faster and more efficient way to get a response.  Please allow 48 business hours for a response.  Please remember that this is for non-urgent requests.  _______________________________________________________  Due to recent changes in healthcare laws, you may see the results of your imaging and laboratory studies on MyChart before your provider has had a chance to review them.  We understand that in some cases there may be results that are confusing or concerning to you. Not all laboratory results come back in the same time frame and the provider may be waiting for multiple results in order to interpret others.  Please give [place] 48 hours in order for your provider to thoroughly review all the results before contacting the office for clarification of your results.   Thank you for entrusting me with your care and choosing [facility].  Deanna May NP

## 2023-11-26 NOTE — Progress Notes (Signed)
Chief Complaint:Diarrhea Primary GI Doctor:Dr. Rhea Belton  HPI: 89 year old male with a history of GERD, esophageal ring with prior dilation, esophageal dysmotility, prior esophageal candidiasis,afib (on Eliquis), celiac disease, history of collagenous colitis, IBS with intermittent abdominal pain, diverticulosis who is here for follow-up.   He was in the hospital with atrial fibrillation and acute heart failure secondary to A-fib.  He was cardioverted on 12/16/2022 after TEE.    Dr. Rhea Belton performed an EGD on 01/09/2022. He had mild esophagitis and balloon dilation of GE junction stricture to 16 mm.   On 12/19/2022 patient last seen in the GI clinic by Dr. Rhea Belton.  At that time patient was having some intermittent solid food dysphagia.  Patient was on famotidine 40 mg twice daily.  He follows a strict gluten-free diet.  His bowel habits at that time were regular with no diarrhea.  At that time it was decided not to do dilatation due to recently being cardioverted and placed on Eliquis.  Can consider repeat upper endoscopy in the future if symptoms persist.   Interval History     Patient presents with main complaint of diarrhea, accompanied by his granddaughter. Patient reports he started having diarrhea about a month ago. He would have up to 10 stools per day with urgency. He reports he has had accidents trying to get to the restroom and also when sleeping in bed. He reports he has lower abdominal comfort. He started taking OTC imodium 1 capsule per day and OTC Pepto Bismul about 1-2 weeks ago.  He reports he noticed his stools were dark and tarry about 3 days. He also takes iron supplement he has taken for years with no dark stools while taking. When reviewing his medications, we discussed his sertraline that was discontinued in the past and he reports he was placed back on it, but cannot recall when. Patient denies SOB, CP, or lightheadedness. He admits to fatigue with the diarrhea.  Patient reports he has  been hydrating well.  It is important to recall patient has history of collagenous colitis that has been in remission since 11/2019, however his granddaughter informs me during visit that the patient and his wife both got Cdiff back in [place] 2024 and he was treated with antibiotics.  She reports that his wife suffered severe symptoms and ended up in the hospital.  Patient reports his primary doctor just recently rechecked his stool to make sure he did not have C. difficile and was negative.     Patient also has history of GERD and taking famotidine 40 mg po daily. Patient continues with esophageal dysphagia several days a week. He also has some regurgitation. Patient denies nausea, vomiting.  Patient has lost about 5 pounds since December.    Patient also tells me he recently had teeth pulled this past week. He states they did not place him on any antibiotics.  He has been taking over-the-counter Tylenol for pain.  Patient has history of PAF,new-onset 2/24 now maintaining NSR on flecainide 100 bid and Eliquis per last cardiology note 09/17/23.  Wt Readings from Last 3 Encounters:  11/26/23 145 lb (65.8 kg)  09/17/23 150 lb (68 kg)  08/26/23 147 lb (66.7 kg)    Past Medical History:  Diagnosis Date   Anemia    Arthritis    osteoarthritis  back   Celiac disease    Chronic kidney disease    Collagenous colitis    Diverticulosis    Enlarged prostate    followed by dr  dahlsted   Esophageal dysmotility    Esophageal dysmotility    Esophageal stenosis    Essential hypertension, benign 03/29/2014   External hemorrhoids    External hemorrhoids    Full dentures    GERD (gastroesophageal reflux disease)    Hemorrhoids    Hiatal hernia    IBS (irritable bowel syndrome)    Internal hemorrhoids    OSA on CPAP 10/17/2018   Osteoporosis    Postoperative anemia due to acute blood loss 03/29/2014   Schatzki's ring    Sleep apnea    CPAP Machine   Ulcer    Past Surgical History:  Procedure Laterality  Date   APPENDECTOMY  1963   CARDIOVERSION N/A 12/16/2022   Procedure: CARDIOVERSION;  Surgeon: Lewayne Bunting, MD;  Location: [facility] ENDOSCOPY;  Service: Cardiovascular;  Laterality: N/A;   ESOPHAGOGASTRODUODENOSCOPY  01/09/2022   EYE SURGERY Bilateral    cataract removal - implants   HERNIA REPAIR  2002   right inguinal    HIP SURGERY Left    INGUINAL HERNIA REPAIR  08/19/2011   Procedure: HERNIA REPAIR  left INGUINAL ADULT;  Surgeon: Adolph Pollack, MD;  Location: [facility] ORS;  Service: General;  Laterality: Left;   INTRAMEDULLARY (IM) NAIL INTERTROCHANTERIC Left 03/27/2014   Procedure: INTRAMEDULLARY (IM) NAIL INTERTROCHANTRIC;  Surgeon: Shelda Pal, MD;  Location: [facility] ORS;  Service: Orthopedics;  Laterality: Left;   RIGHT/LEFT HEART CATH AND CORONARY ANGIOGRAPHY N/A 03/12/2017   Procedure: Right/Left Heart Cath and Coronary Angiography;  Surgeon: Dolores Patty, MD;  Location: [facility] INVASIVE CV LAB;  Service: Cardiovascular;  Laterality: N/A;   TEE WITHOUT CARDIOVERSION N/A 12/16/2022   Procedure: TRANSESOPHAGEAL ECHOCARDIOGRAM (TEE);  Surgeon: Lewayne Bunting, MD;  Location: [facility] ENDOSCOPY;  Service: Cardiovascular;  Laterality: N/A;   UPPER GASTROINTESTINAL ENDOSCOPY  2008    Current Outpatient Medications  Medication Sig Dispense Refill   acetaminophen (TYLENOL) 500 MG tablet Take 1,000 mg by mouth as needed for mild pain or moderate pain.     albuterol (PROVENTIL HFA;VENTOLIN HFA) 108 (90 Base) MCG/ACT inhaler Inhale 2 puffs into the lungs every 6 (six) hours as needed for wheezing or shortness of breath.     amLODipine (NORVASC) 5 MG tablet Take 5 mg by mouth daily.     apixaban (ELIQUIS) 5 MG TABS tablet Take 1 tablet (5 mg total) by mouth 2 (two) times daily. 180 tablet 3   diclofenac Sodium (VOLTAREN) 1 % GEL Apply 2 g topically 4 (four) times daily as needed (pain.).     doxazosin (CARDURA) 8 MG tablet Take 4 mg by mouth daily.     ferrous sulfate 325 (65 FE) MG tablet Take 325  mg by mouth at bedtime.     flecainide (TAMBOCOR) 50 MG tablet Take 50 mg by mouth 2 (two) times daily. Per patient taking 2 tablets in the morning and taking 2 tablets at night     furosemide (LASIX) 20 MG tablet Take 1 tablet by mouth as needed or directed by the Heart Failure clinic 15 tablet 11   gabapentin (NEURONTIN) 600 MG tablet Take 600 mg by mouth at bedtime.     loratadine (CLARITIN) 10 MG tablet Take 10 mg by mouth in the morning.     magnesium oxide (MAG-OX) 400 (240 Mg) MG tablet Take 400 mg by mouth in the morning.     potassium chloride SA (KLOR-CON M) 20 MEQ tablet TAKE 1 TABLET EVERY DAY WITH SUPPER 90 tablet 1  sertraline (ZOLOFT) 50 MG tablet Take 25 mg by mouth at bedtime.     zolpidem (AMBIEN) 10 MG tablet Take 10 mg by mouth at bedtime as needed for sleep.     No current facility-administered medications for this visit.    Allergies as of 11/26/2023 - Review Complete 11/26/2023  Allergen Reaction Noted   Gluten meal  05/27/2023   Wheat Other (See Comments) 11/18/2021    Family History  Problem Relation Age of Onset   Cervical cancer Mother    Colon cancer Mother    Stroke Father    Heart attack Father    Breast cancer Sister    Throat cancer Brother    Liver cancer Brother    Brain cancer Brother    Esophageal cancer Neg Hx    Rectal cancer Neg Hx    Stomach cancer Neg Hx     Review of Systems:    Constitutional: No weight loss, fever, chills, weakness or fatigue HEENT: Eyes: No change in vision               Ears, Nose, Throat:  No change in hearing or congestion Skin: No rash or itching Cardiovascular: No chest pain, chest pressure or palpitations   Respiratory: No SOB or cough Gastrointestinal: See HPI and otherwise negative Genitourinary: No dysuria or change in urinary frequency Neurological: No headache, dizziness or syncope Musculoskeletal: No new muscle or joint pain Hematologic: No bleeding or bruising Psychiatric: No history of  depression or anxiety    Physical Exam:  Vital signs: BP (!) 128/58   Pulse 65   Ht 5\' 6"  (1.676 m)   Wt 145 lb (65.8 kg)   BMI 23.40 kg/m   Constitutional:   Pleasant Caucasian male appears to be in NAD, Well developed, Well nourished, alert and cooperative Throat: Oral cavity and pharynx without inflammation, swelling or lesion.  Respiratory: Respirations even and unlabored. Lungs clear to auscultation bilaterally.   No wheezes, crackles, or rhonchi.  Cardiovascular: Normal S1, S2. Regular rate and rhythm. No peripheral edema, cyanosis or pallor.  Gastrointestinal:  Soft, nondistended, nontender. No rebound or guarding. Normal bowel sounds. No appreciable masses or hepatomegaly. Rectal:  brown stool noted in vault. No internal hemorrhoids appreciated. External no hemorrhoids. No tenderness. Stool card positive. Msk:  Symmetrical without gross deformities. Without edema, no deformity or joint abnormality.  Neurologic:  Alert and  oriented x4;  grossly normal neurologically.  Skin:   Dry and intact without significant lesions or rashes. Psychiatric: Oriented to person, place and time. Demonstrates good judgement and reason without abnormal affect or behaviors.  RELEVANT LABS AND IMAGING: CBC    Latest Ref Rng & Units 09/17/2023    3:34 PM 06/05/2023   10:27 AM 05/27/2023    3:55 AM  CBC  WBC 3.4 - 10.8 x10E3/uL 4.8  6.5  6.1   Hemoglobin 13.0 - 17.7 g/dL 40.9  81.1  91.4   Hematocrit 37.5 - 51.0 % 36.9  40.7  38.3   Platelets 150 - 450 x10E3/uL 198  240  188      CMP     Latest Ref Rng & Units 10/05/2023   12:38 PM 09/17/2023    3:34 PM 06/05/2023   10:27 AM  CMP  Glucose 70 - 99 mg/dL 97  91  89   BUN 8 - 23 mg/dL 18  20  19    Creatinine 0.61 - 1.24 mg/dL 7.82  9.56  2.13   Sodium 135 - 145  mmol/L 138  144  135   Potassium 3.5 - 5.1 mmol/L 4.0  4.6  4.2   Chloride 98 - 111 mmol/L 105  102  101   CO2 22 - 32 mmol/L 27  26  24    Calcium 8.9 - 10.3 mg/dL 9.1  9.0  8.5       Lab Results  Component Value Date   TSH 1.595 12/12/2022  Per PCP CDF was neg 11/16/23 02/17/2023 echo-Left ventricular ejection fraction, by estimation, is 55 to 60%.  01/09/2022 upper endoscopy Impression:  - LA Grade A reflux esophagitis with no bleeding.  - Benign- appearing esophageal stenosis. Dilated to 16 mm with balloon with moderate mucosal disruption.  - 1 cm hiatal hernia.  - Normal stomach.  - Normal examined duodenum.  - No specimens collected. 04/19/2020 upper endoscopy Impression:  - Esophageal plaques were found, consistent with candidiasis.  - Benign- appearing esophageal stenosis. Dilated to 17 mm with balloon.  - Normal stomach.  - Two duodenal polyps. Resected and retrieved. Path:Diagnosis Surgical [P], duodenal polyps - DUODENAL MUCOSA WITH HYPEREMIA AND DILATED VESSELS. - NO FEATURES OF SPRUE, ADENOMATOUS CHANGE OR CARCINOMA. - SEE MICROSCOPIC DESCRIPTION 06/16/2019 colonoscopy Impression:  - The examined portion of the ileum was normal. - Moderate diverticulosis in the left colon. - External hemorrhoids. - The examination was otherwise normal on direct and retroflexion views. Biopsies obtained. Path:Diagnosis Surgical [P], random sites - COLLAGENOUS COLITIS  Assessment:    89 year old male patient who presents with acute onset diarrhea and reported black tarry stools for the last 3 days.  Positive fecal occult today in office. He was taking both iron supplements and Pepto Bismul during episode. Hgb---> 13.2--->11.7. Spoke with patient's PCP and he had CDF stool test done neg 11/16/23. Notified PCP of tarry stools.  Patient does have history of collagenous colitis and the concern is he Merrilee Ancona be experiencing flare up.  With the positive fecal occult and anemia there is concerns while being on [facility] that he Mychal Decarlo be actively bleeding.  I will recheck his CBC today along with CMP with electrolytes and magnesium.  Patient has also had ongoing issues with esophageal dysphagia and  unfortunately due to cardioversion during his last visit in 12/2022 no procedures were scheduled at that time. We are avoiding PPIs given his history of collagenous colitis.  I will go ahead and order upper GI endoscopy with possible dilatation and colonoscopy to evaluate for source of bleeding.  I will also have Dr. Barron Alvine take random biopsies of colon to rule out collagenous colitis.  Patient has done well and responded to budesonide in the past.  Patient can take Imodium 2 capsules 2-3 times per day.  Will discuss case with Dr. Rhea Belton.   Plan: - Order CBC, CMP, Magnesium, TIBC, ferritin today -Push oral hydration -Imodium 2 capsule BID. Hold if no bowel movement for 24hrs. - Schedule EGD with possible dilatation with Dr. Barron Alvine (soonest availability). The risks and benefits of EGD with possible biopsies and esophageal dilation were discussed with the patient who agrees to proceed.  -Schedule a colonoscopy. The risks and benefits of colonoscopy with possible polypectomy / biopsies were discussed and the patient agrees to proceed. Cardiac clearance with Dr. Peter [place].  -Hold Eliquis 2 days before procedure - will instruct when and how to resume after procedure. Risks and benefits of procedure including bleeding, perforation, infection, missed lesions, medication reactions and possible hospitalization or surgery if complications occur explained. Additional rare but real risk of  cardiovascular event such as heart attack or ischemia/infarct of other organs off Eliquis explained and need to seek urgent help if this occurs. Will communicate by phone or EMR with patient's prescribing provider that to confirm holding Eliquis is reasonable in this case.   -ER precautions given.  Thank you for the courtesy of this consult. Please call me with any questions or concerns.   Darcy Barbara, FNP-C [facility] 11/26/2023, 12:11 PM  Cc: Chilton Greathouse, MD

## 2023-11-27 ENCOUNTER — Encounter: Payer: Self-pay | Admitting: Gastroenterology

## 2023-11-27 ENCOUNTER — Telehealth: Payer: Self-pay | Admitting: Gastroenterology

## 2023-11-27 ENCOUNTER — Encounter (HOSPITAL_COMMUNITY): Payer: Self-pay

## 2023-11-27 ENCOUNTER — Other Ambulatory Visit: Payer: Self-pay

## 2023-11-27 ENCOUNTER — Other Ambulatory Visit: Payer: Self-pay | Admitting: Gastroenterology

## 2023-11-27 ENCOUNTER — Inpatient Hospital Stay (HOSPITAL_COMMUNITY)
Admission: EM | Admit: 2023-11-27 | Discharge: 2023-12-01 | DRG: 372 | Disposition: A | Discharge status: Home / Self Care | Payer: Medicare HMO | Attending: Internal Medicine | Admitting: Internal Medicine

## 2023-11-27 DIAGNOSIS — K900 Celiac disease: Secondary | ICD-10-CM | POA: Diagnosis not present

## 2023-11-27 DIAGNOSIS — I10 Essential (primary) hypertension: Secondary | ICD-10-CM | POA: Diagnosis present

## 2023-11-27 DIAGNOSIS — D509 Iron deficiency anemia, unspecified: Secondary | ICD-10-CM | POA: Diagnosis present

## 2023-11-27 DIAGNOSIS — Z8049 Family history of malignant neoplasm of other genital organs: Secondary | ICD-10-CM

## 2023-11-27 DIAGNOSIS — K921 Melena: Secondary | ICD-10-CM | POA: Diagnosis not present

## 2023-11-27 DIAGNOSIS — N1831 Chronic kidney disease, stage 3a: Secondary | ICD-10-CM | POA: Diagnosis not present

## 2023-11-27 DIAGNOSIS — Z800 Family history of malignant neoplasm of digestive organs: Secondary | ICD-10-CM

## 2023-11-27 DIAGNOSIS — K2100 Gastro-esophageal reflux disease with esophagitis, without bleeding: Secondary | ICD-10-CM | POA: Diagnosis present

## 2023-11-27 DIAGNOSIS — I480 Paroxysmal atrial fibrillation: Secondary | ICD-10-CM | POA: Diagnosis not present

## 2023-11-27 DIAGNOSIS — R1312 Dysphagia, oropharyngeal phase: Secondary | ICD-10-CM | POA: Diagnosis not present

## 2023-11-27 DIAGNOSIS — M810 Age-related osteoporosis without current pathological fracture: Secondary | ICD-10-CM | POA: Diagnosis present

## 2023-11-27 DIAGNOSIS — Z87891 Personal history of nicotine dependence: Secondary | ICD-10-CM | POA: Diagnosis not present

## 2023-11-27 DIAGNOSIS — Z7901 Long term (current) use of anticoagulants: Secondary | ICD-10-CM

## 2023-11-27 DIAGNOSIS — I4891 Unspecified atrial fibrillation: Secondary | ICD-10-CM | POA: Diagnosis not present

## 2023-11-27 DIAGNOSIS — R531 Weakness: Secondary | ICD-10-CM

## 2023-11-27 DIAGNOSIS — Z79899 Other long term (current) drug therapy: Secondary | ICD-10-CM

## 2023-11-27 DIAGNOSIS — K2950 Unspecified chronic gastritis without bleeding: Secondary | ICD-10-CM | POA: Diagnosis not present

## 2023-11-27 DIAGNOSIS — K222 Esophageal obstruction: Secondary | ICD-10-CM | POA: Diagnosis present

## 2023-11-27 DIAGNOSIS — Z8711 Personal history of peptic ulcer disease: Secondary | ICD-10-CM

## 2023-11-27 DIAGNOSIS — E876 Hypokalemia: Secondary | ICD-10-CM | POA: Diagnosis not present

## 2023-11-27 DIAGNOSIS — K922 Gastrointestinal hemorrhage, unspecified: Principal | ICD-10-CM

## 2023-11-27 DIAGNOSIS — R933 Abnormal findings on diagnostic imaging of other parts of digestive tract: Secondary | ICD-10-CM | POA: Diagnosis not present

## 2023-11-27 DIAGNOSIS — Z8249 Family history of ischemic heart disease and other diseases of the circulatory system: Secondary | ICD-10-CM

## 2023-11-27 DIAGNOSIS — K640 First degree hemorrhoids: Secondary | ICD-10-CM | POA: Diagnosis not present

## 2023-11-27 DIAGNOSIS — N183 Chronic kidney disease, stage 3 unspecified: Secondary | ICD-10-CM | POA: Diagnosis not present

## 2023-11-27 DIAGNOSIS — I130 Hypertensive heart and chronic kidney disease with heart failure and stage 1 through stage 4 chronic kidney disease, or unspecified chronic kidney disease: Secondary | ICD-10-CM | POA: Diagnosis present

## 2023-11-27 DIAGNOSIS — K5140 Inflammatory polyps of colon without complications: Secondary | ICD-10-CM | POA: Diagnosis not present

## 2023-11-27 DIAGNOSIS — K219 Gastro-esophageal reflux disease without esophagitis: Secondary | ICD-10-CM | POA: Diagnosis present

## 2023-11-27 DIAGNOSIS — N401 Enlarged prostate with lower urinary tract symptoms: Secondary | ICD-10-CM | POA: Diagnosis not present

## 2023-11-27 DIAGNOSIS — R1310 Dysphagia, unspecified: Secondary | ICD-10-CM | POA: Diagnosis not present

## 2023-11-27 DIAGNOSIS — K633 Ulcer of intestine: Secondary | ICD-10-CM | POA: Diagnosis present

## 2023-11-27 DIAGNOSIS — K449 Diaphragmatic hernia without obstruction or gangrene: Secondary | ICD-10-CM | POA: Diagnosis not present

## 2023-11-27 DIAGNOSIS — I5032 Chronic diastolic (congestive) heart failure: Secondary | ICD-10-CM | POA: Diagnosis not present

## 2023-11-27 DIAGNOSIS — Z803 Family history of malignant neoplasm of breast: Secondary | ICD-10-CM

## 2023-11-27 DIAGNOSIS — E860 Dehydration: Secondary | ICD-10-CM | POA: Diagnosis present

## 2023-11-27 DIAGNOSIS — R197 Diarrhea, unspecified: Secondary | ICD-10-CM | POA: Diagnosis present

## 2023-11-27 DIAGNOSIS — G4733 Obstructive sleep apnea (adult) (pediatric): Secondary | ICD-10-CM | POA: Diagnosis not present

## 2023-11-27 DIAGNOSIS — I129 Hypertensive chronic kidney disease with stage 1 through stage 4 chronic kidney disease, or unspecified chronic kidney disease: Secondary | ICD-10-CM | POA: Diagnosis not present

## 2023-11-27 DIAGNOSIS — Z808 Family history of malignant neoplasm of other organs or systems: Secondary | ICD-10-CM

## 2023-11-27 DIAGNOSIS — Z823 Family history of stroke: Secondary | ICD-10-CM

## 2023-11-27 DIAGNOSIS — K8681 Exocrine pancreatic insufficiency: Secondary | ICD-10-CM | POA: Diagnosis not present

## 2023-11-27 DIAGNOSIS — N400 Enlarged prostate without lower urinary tract symptoms: Secondary | ICD-10-CM | POA: Diagnosis present

## 2023-11-27 DIAGNOSIS — A0472 Enterocolitis due to Clostridium difficile, not specified as recurrent: Principal | ICD-10-CM | POA: Diagnosis present

## 2023-11-27 DIAGNOSIS — R195 Other fecal abnormalities: Secondary | ICD-10-CM | POA: Diagnosis not present

## 2023-11-27 DIAGNOSIS — K529 Noninfective gastroenteritis and colitis, unspecified: Secondary | ICD-10-CM | POA: Diagnosis not present

## 2023-11-27 DIAGNOSIS — Z91148 Patient's other noncompliance with medication regimen for other reason: Secondary | ICD-10-CM

## 2023-11-27 DIAGNOSIS — K317 Polyp of stomach and duodenum: Secondary | ICD-10-CM | POA: Diagnosis not present

## 2023-11-27 DIAGNOSIS — I340 Nonrheumatic mitral (valve) insufficiency: Secondary | ICD-10-CM | POA: Diagnosis present

## 2023-11-27 DIAGNOSIS — D649 Anemia, unspecified: Secondary | ICD-10-CM | POA: Diagnosis not present

## 2023-11-27 LAB — COMPREHENSIVE METABOLIC PANEL
ALT: 14 U/L (ref 0–44)
AST: 17 U/L (ref 15–41)
Albumin: 3.7 g/dL (ref 3.5–5.0)
Alkaline Phosphatase: 57 U/L (ref 38–126)
Anion gap: 11 (ref 5–15)
BUN: 16 mg/dL (ref 8–23)
CO2: 24 mmol/L (ref 22–32)
Calcium: 9.2 mg/dL (ref 8.9–10.3)
Chloride: 101 mmol/L (ref 98–111)
Creatinine, Ser: 1.25 mg/dL — ABNORMAL HIGH (ref 0.61–1.24)
GFR, Estimated: 56 mL/min — ABNORMAL LOW (ref >60)
Glucose, Bld: 89 mg/dL (ref 70–99)
Potassium: 4.4 mmol/L (ref 3.5–5.1)
Sodium: 136 mmol/L (ref 135–145)
Total Bilirubin: 0.5 mg/dL (ref 0.0–1.2)
Total Protein: 7.3 g/dL (ref 6.5–8.1)

## 2023-11-27 LAB — CBC
HCT: 39.7 % (ref 39.0–52.0)
Hemoglobin: 12.5 g/dL — ABNORMAL LOW (ref 13.0–17.0)
MCH: 31.2 pg (ref 26.0–34.0)
MCHC: 31.5 g/dL (ref 30.0–36.0)
MCV: 99 fL (ref 80.0–100.0)
Platelets: 240 10*3/uL (ref 150–400)
RBC: 4.01 MIL/uL — ABNORMAL LOW (ref 4.22–5.81)
RDW: 14.1 % (ref 11.5–15.5)
WBC: 9.7 10*3/uL (ref 4.0–10.5)
nRBC: 0 % (ref 0.0–0.2)

## 2023-11-27 LAB — MAGNESIUM: Magnesium: 2 mg/dL (ref 1.7–2.4)

## 2023-11-27 LAB — VITAMIN B12: Vitamin B-12: 701 pg/mL (ref 180–914)

## 2023-11-27 LAB — TYPE AND SCREEN
ABO/RH(D): B POS
Antibody Screen: NEGATIVE

## 2023-11-27 LAB — C DIFFICILE QUICK SCREEN W PCR REFLEX
C Diff antigen: NEGATIVE
C Diff interpretation: NOT DETECTED
C Diff toxin: NEGATIVE

## 2023-11-27 MED ORDER — ACETAMINOPHEN 650 MG RE SUPP: 650.0000 mg | Freq: Four times a day (QID) | RECTAL | Status: DC | PRN

## 2023-11-27 MED ORDER — DOXAZOSIN MESYLATE 4 MG PO TABS
4.0000 mg | ORAL_TABLET | Freq: Every day | ORAL | Status: DC
Start: 2023-11-28 — End: 2023-12-01
  Administered 2023-11-28 – 2023-12-01 (×4): 4 mg via ORAL
  Filled 2023-11-27 (×4): qty 1

## 2023-11-27 MED ORDER — FLECAINIDE ACETATE 100 MG PO TABS
100.0000 mg | ORAL_TABLET | Freq: Two times a day (BID) | ORAL | Status: DC
  Administered 2023-11-27 – 2023-12-01 (×8): 100 mg via ORAL
  Filled 2023-11-27 (×8): qty 1

## 2023-11-27 MED ORDER — FLECAINIDE ACETATE 50 MG PO TABS
50.0000 mg | ORAL_TABLET | Freq: Two times a day (BID) | ORAL | Status: DC
Start: 2023-11-27 — End: 2023-11-27

## 2023-11-27 MED ORDER — ZOLPIDEM TARTRATE 5 MG PO TABS
5.0000 mg | ORAL_TABLET | Freq: Every evening | ORAL | Status: DC | PRN
  Administered 2023-11-27 – 2023-11-30 (×4): 5 mg via ORAL
  Filled 2023-11-27 (×4): qty 1

## 2023-11-27 MED ORDER — SERTRALINE HCL 50 MG PO TABS
25.0000 mg | ORAL_TABLET | Freq: Every day | ORAL | Status: DC
  Administered 2023-11-27 – 2023-11-29 (×3): 25 mg via ORAL
  Filled 2023-11-27 (×3): qty 1

## 2023-11-27 MED ORDER — LACTATED RINGERS IV SOLN: INTRAVENOUS | Status: DC

## 2023-11-27 MED ORDER — ALBUTEROL SULFATE (2.5 MG/3ML) 0.083% IN NEBU: 2.5000 mg | INHALATION_SOLUTION | Freq: Four times a day (QID) | RESPIRATORY_TRACT | Status: DC | PRN

## 2023-11-27 MED ORDER — PROCHLORPERAZINE EDISYLATE 10 MG/2ML IJ SOLN
5.0000 mg | Freq: Four times a day (QID) | INTRAMUSCULAR | Status: DC | PRN
  Administered 2023-11-29: 5 mg via INTRAVENOUS
  Filled 2023-11-27: qty 2

## 2023-11-27 MED ORDER — ACETAMINOPHEN 325 MG PO TABS
650.0000 mg | ORAL_TABLET | Freq: Four times a day (QID) | ORAL | Status: DC | PRN
  Administered 2023-11-28 – 2023-12-01 (×8): 650 mg via ORAL
  Filled 2023-11-27 (×8): qty 2

## 2023-11-27 MED ORDER — HYDRALAZINE HCL 25 MG PO TABS: 25.0000 mg | ORAL_TABLET | Freq: Four times a day (QID) | ORAL | Status: DC | PRN

## 2023-11-27 MED ORDER — FAMOTIDINE IN NACL 20-0.9 MG/50ML-% IV SOLN
20.0000 mg | Freq: Once | INTRAVENOUS | Status: AC
  Administered 2023-11-27: 20 mg via INTRAVENOUS
  Filled 2023-11-27: qty 50

## 2023-11-27 NOTE — Telephone Encounter (Signed)
I spoke with patient directly, he reports he is very weak, tired, lethargic, not feeling well. States he cannot make it two more weeks for procedures. Spoke with Dr. Rhea Belton and directed patient to ER.  Deanna May NP

## 2023-11-27 NOTE — H&P (Signed)
 History and Physical    [name] ZOX:[id number] DOB: 1935/05/07 DOA: 11/27/2023  PCP: Chilton Greathouse, MD   Patient coming from: Home   Chief Complaint: Diarrhea, dark stool, fatigue   HPI: [name] is a 89 y.o. male with medical history significant for hypertension, atrial fibrillation on Eliquis, chronic HFpEF, OSA on CPAP, celiac disease, microscopic colitis, dysphagia, and C. difficile colitis who presents with 1 month of loose stools, more recent dark stool, and worsening fatigue.  Patient reports 1 month of loose stools with up to 10-12 episodes daily.  There has not been any abdominal pain, nausea, vomiting, fever, or chills associated with this.  He has noted his stool to be very dark, essentially black for the past 5 days.  He has experienced progressive generalized weakness and fatigue over the course of this illness.  He denies any chest pain, shortness of breath, or focal numbness or weakness.  ED Course: Upon arrival to the ED, patient is found to be afebrile and saturating well on room air with normal heart rate and stable blood pressure.  Labs are most notable for normal BUN, creatinine 1.25, normal WBC, hemoglobin 12.5, and normal LFTs.  GI was consulted by the ED physician and the patient was treated with Pepcid.  Review of Systems:  All other systems reviewed and apart from HPI, are negative.  Past Medical History:  Diagnosis Date   Anemia    Arthritis    osteoarthritis  back   Celiac disease    Chronic kidney disease    Collagenous colitis    Diverticulosis    Enlarged prostate    followed by dr  [name]   Esophageal dysmotility    Esophageal dysmotility    Esophageal stenosis    Essential hypertension, benign 03/29/2014   External hemorrhoids    External hemorrhoids    Full dentures    GERD (gastroesophageal reflux disease)    Hemorrhoids    Hiatal hernia    IBS (irritable bowel syndrome)    Internal hemorrhoids    OSA on CPAP 10/17/2018    Osteoporosis    Postoperative anemia due to acute blood loss 03/29/2014   Schatzki's ring    Sleep apnea    CPAP Machine   Ulcer     Past Surgical History:  Procedure Laterality Date   APPENDECTOMY  1963   CARDIOVERSION N/A 12/16/2022   Procedure: CARDIOVERSION;  Surgeon: Lewayne Bunting, MD;  Location: [facility] ENDOSCOPY;  Service: Cardiovascular;  Laterality: N/A;   ESOPHAGOGASTRODUODENOSCOPY  01/09/2022   EYE SURGERY Bilateral    cataract removal - implants   HERNIA REPAIR  2002   right inguinal    HIP SURGERY Left    INGUINAL HERNIA REPAIR  08/19/2011   Procedure: HERNIA REPAIR  left INGUINAL ADULT;  Surgeon: Adolph Pollack, MD;  Location: [facility] ORS;  Service: General;  Laterality: Left;   INTRAMEDULLARY (IM) NAIL INTERTROCHANTERIC Left 03/27/2014   Procedure: INTRAMEDULLARY (IM) NAIL INTERTROCHANTRIC;  Surgeon: Shelda Pal, MD;  Location: [facility] ORS;  Service: Orthopedics;  Laterality: Left;   RIGHT/LEFT HEART CATH AND CORONARY ANGIOGRAPHY N/A 03/12/2017   Procedure: Right/Left Heart Cath and Coronary Angiography;  Surgeon: Dolores Patty, MD;  Location: [facility] INVASIVE CV LAB;  Service: Cardiovascular;  Laterality: N/A;   TEE WITHOUT CARDIOVERSION N/A 12/16/2022   Procedure: TRANSESOPHAGEAL ECHOCARDIOGRAM (TEE);  Surgeon: Lewayne Bunting, MD;  Location: [facility] ENDOSCOPY;  Service: Cardiovascular;  Laterality: N/A;   UPPER GASTROINTESTINAL ENDOSCOPY  2008  Social History:   reports that he quit smoking about 42 years ago. His smoking use included cigarettes. He has never used smokeless tobacco. He reports current alcohol use. He reports that he does not use drugs.  Allergies  Allergen Reactions   Gluten Meal     Celiac Disease   Wheat Other (See Comments)    Celiac Disease    Family History  Problem Relation Age of Onset   Cervical cancer Mother    Colon cancer Mother    Stroke Father    Heart attack Father    Breast cancer Sister    Throat cancer Brother    Liver cancer  Brother    Brain cancer Brother    Esophageal cancer Neg Hx    Rectal cancer Neg Hx    Stomach cancer Neg Hx      Prior to Admission medications   Medication Sig Start Date End Date Taking? Authorizing Provider  acetaminophen (TYLENOL) 500 MG tablet Take 1,000 mg by mouth as needed for mild pain or moderate pain.    [provider]  albuterol (PROVENTIL HFA;VENTOLIN HFA) 108 (90 Base) MCG/ACT inhaler Inhale 2 puffs into the lungs every 6 (six) hours as needed for wheezing or shortness of breath.    [provider]  amLODipine (NORVASC) 5 MG tablet Take 5 mg by mouth daily.    [provider]  apixaban (ELIQUIS) 5 MG TABS tablet Take 1 tablet (5 mg total) by mouth 2 (two) times daily. 11/06/23   [place], Peter M, MD  diclofenac Sodium (VOLTAREN) 1 % GEL Apply 2 g topically 4 (four) times daily as needed (pain.).    [provider]  doxazosin (CARDURA) 8 MG tablet Take 4 mg by mouth daily.    [provider]  ferrous sulfate 325 (65 FE) MG tablet Take 325 mg by mouth at bedtime.    [provider]  flecainide (TAMBOCOR) 50 MG tablet Take 50 mg by mouth 2 (two) times daily. Per patient taking 2 tablets in the morning and taking 2 tablets at night    [provider]  furosemide (LASIX) 20 MG tablet Take 1 tablet by mouth as needed or directed by the Heart Failure clinic 09/21/23   Bensimhon, Bevelyn Buckles, MD  gabapentin (NEURONTIN) 600 MG tablet Take 600 mg by mouth at bedtime. 11/28/22   [provider]  loratadine (CLARITIN) 10 MG tablet Take 10 mg by mouth in the morning.    [provider]  magnesium oxide (MAG-OX) 400 (240 Mg) MG tablet Take 400 mg by mouth in the morning.    [provider]  potassium chloride SA (KLOR-CON M) 20 MEQ tablet TAKE 1 TABLET EVERY DAY WITH SUPPER 11/11/23   Bensimhon, Bevelyn Buckles, MD  sertraline (ZOLOFT) 50 MG tablet Take 25 mg by mouth at bedtime.    [provider]  zolpidem  (AMBIEN) 10 MG tablet Take 10 mg by mouth at bedtime as needed for sleep.    [provider]    Physical Exam: Vitals:   11/27/23 1418 11/27/23 1419 11/27/23 1717  BP:  (!) 141/82 (!) 177/96  Pulse:  85 77  Resp:  18 19  Temp:  (!) 97.5 F (36.4 C)   TempSrc:  Oral   SpO2:  95% 100%  Weight: 65.8 kg    Height: 5\' 6"  (1.676 m)      Constitutional: NAD, calm  Eyes: PERTLA, lids and conjunctivae normal ENMT: Mucous membranes are moist.  Posterior pharynx clear of any exudate or lesions.   Neck: supple, no masses  Respiratory: no wheezing, no crackles. No accessory muscle use.  Cardiovascular: S1 & S2 heard, regular rate and rhythm. No extremity edema.   Abdomen: no tenderness, soft. Bowel sounds active.  Musculoskeletal: no clubbing / cyanosis. No joint deformity upper and lower extremities.   Skin: no significant rashes, lesions, ulcers. Warm, dry, well-perfused. Neurologic: CN 2-12 grossly intact. Sensation intact. Strength 5/5 in all 4 limbs. Resting tremor. Alert and oriented.  Psychiatric: Pleasant. Cooperative.    Labs and Imaging on Admission: I have personally reviewed following labs and imaging studies  CBC: Recent Labs  Lab 11/26/23 1151 11/27/23 1526  WBC 7.1 9.7  NEUTROABS 4.7  --   HGB 12.8* 12.5*  HCT 39.3 39.7  MCV 96.6 99.0  PLT 233.0 240   Basic Metabolic Panel: Recent Labs  Lab 11/26/23 1151 11/27/23 1526  NA 136 136  K 3.8 4.4  CL 100 101  CO2 25 24  GLUCOSE 90 89  BUN 16 16  CREATININE 1.37 1.25*  CALCIUM 9.2 9.2  MG 2.2 2.0   GFR: Estimated Creatinine Clearance: 38.3 mL/min (A) (by C-G formula based on SCr of 1.25 mg/dL (H)). Liver Function Tests: Recent Labs  Lab 11/26/23 1151 11/27/23 1526  AST 16 17  ALT 10 14  ALKPHOS 57 57  BILITOT 0.3 0.5  PROT 7.5 7.3  ALBUMIN 4.2 3.7   No results for input(s): "LIPASE", "AMYLASE" in the last 168 hours. No results for input(s): "AMMONIA" in the last 168 hours. Coagulation  Profile: No results for input(s): "INR", "PROTIME" in the last 168 hours. Cardiac Enzymes: No results for input(s): "CKTOTAL", "CKMB", "CKMBINDEX", "TROPONINI" in the last 168 hours. BNP (last 3 results) Recent Labs    12/10/22 0938  PROBNP 2,546*   HbA1C: No results for input(s): "HGBA1C" in the last 72 hours. CBG: No results for input(s): "GLUCAP" in the last 168 hours. Lipid Profile: No results for input(s): "CHOL", "HDL", "LDLCALC", "TRIG", "CHOLHDL", "LDLDIRECT" in the last 72 hours. Thyroid Function Tests: No results for input(s): "TSH", "T4TOTAL", "FREET4", "T3FREE", "THYROIDAB" in the last 72 hours. Anemia Panel: Recent Labs    11/26/23 1151 11/27/23 1553  VITAMINB12  --  701  FERRITIN 120.8  --   TIBC 289.8  --   IRON 22*  --    Urine analysis:    Component Value Date/Time   COLORURINE STRAW (A) 05/26/2023 2345   APPEARANCEUR CLEAR 05/26/2023 2345   LABSPEC 1.010 05/26/2023 2345   PHURINE 7.0 05/26/2023 2345   GLUCOSEU NEGATIVE 05/26/2023 2345   HGBUR NEGATIVE 05/26/2023 2345   BILIRUBINUR NEGATIVE 05/26/2023 2345   KETONESUR NEGATIVE 05/26/2023 2345   PROTEINUR NEGATIVE 05/26/2023 2345   UROBILINOGEN 0.2 04/24/2009 1100   NITRITE NEGATIVE 05/26/2023 2345   LEUKOCYTESUR NEGATIVE 05/26/2023 2345   Sepsis Labs: @LABRCNTIP (procalcitonin:4,lacticidven:4) )No results found for this or any previous visit (from the past 240 hours).   Radiological Exams on Admission: No results found.  Assessment/Plan   1. Dark stool; iron deficiency  - GI consultation appreciated    - Hold Eliquis (last dose was this morning), Pepto-Bismol, and oral iron supplement, monitor for bleeding   2. Diarrhea  - C diff test pending   - Hold Lasix, continue IVF hydration, monitor electrolytes and renal function, follow-up on C diff testing, consider Budesonide if negative   3. PAF  - Hold Eliquis (last dose was this morning), continue flecainide  4. Chronic HFpEF  - Hold  Lasix, monitor volume status    5. CKD 3A  - Appears close to baseline  - Renally-dose medications   6. OSA  - CPAP while sleeping    7. Generalized weakness/fatigue  - Hydrate with IVF, monitor H&H, consult PT     DVT prophylaxis: SCDs  Code Status: Full  Level of Care: Level of care: Med-Surg Family Communication: None present   Disposition Plan:  Patient is from: Home  Anticipated d/c is to: TBD Anticipated d/c date is: 11/30/23  Patient currently: Pending clinical course, may need inpatient endoscopy, PT eval   Consults called: GI  Admission status: Inpatient     Briscoe Deutscher, MD [facility]  11/27/2023, 5:48 PM

## 2023-11-27 NOTE — Telephone Encounter (Signed)
Pts daughter calling, states her father looks worse than he did yesterday and that his stools are black. She had to go over the straighten up the house and to do Laundry which he usually dose on his own. Pt is concerned that he is not going to make it until the 24th for the procedure. She is concerned and is asking about a sooner procedure. Please advise.

## 2023-11-27 NOTE — Progress Notes (Signed)
   11/27/23 2338  BiPAP/CPAP/SIPAP  $ Non-Invasive Home Ventilator  Initial  $ Face Mask Medium Yes  BiPAP/CPAP/SIPAP Pt Type Adult  BiPAP/CPAP/SIPAP Resmed  Mask Type Full face mask  Dentures removed? Not applicable  Mask Size Medium  Respiratory Rate 18 breaths/min  Patient Home Equipment No  Auto Titrate Yes (4-16)

## 2023-11-27 NOTE — Telephone Encounter (Signed)
Patient with diagnosis of afib on Eliquis for anticoagulation.    Procedure: Endoscopy/Colonoscopy Date of procedure: 12/07/23   CHA2DS2-VASc Score = 3   This indicates a 3.2% annual risk of stroke. The patient's score is based upon: CHF History: 0 HTN History: 1 Diabetes History: 0 Stroke History: 0 Vascular Disease History: 0 Age Score: 2 Gender Score: 0   CrCl 36 mL/min  Platelet count 233 K    Per office protocol, patient can hold Eliquis for 2 days prior to procedure.    **This guidance is not considered finalized until pre-operative APP has relayed final recommendations.**

## 2023-11-27 NOTE — ED Triage Notes (Signed)
Patient is here for evaluation of dark stools. Reports that this has been going on for about 5 days. Went to PCP yesterday and confirmed positive hemoccult, wanted patient to come to the ER due to severe diarrhea which is causing weakness. Scheduled for endoscopy and colonoscopy on 12/07/23. Due to increase amount of bowel movements and weakness, PCP sent him to the ER.

## 2023-11-27 NOTE — ED Notes (Signed)
ED TO INPATIENT HANDOFF REPORT  ED Nurse Name and Phone #: Wynema Birch Name/Age/Gender [name] 89 y.o. male Room/Bed: WA14/WA14  Code Status   Code Status: Prior  Home/SNF/Other Home Patient oriented to: self, place, time, and situation Is this baseline? Yes   Triage Complete: Triage complete  Chief Complaint Diarrhea with dehydration [R19.7]  Triage Note Patient is here for evaluation of dark stools. Reports that this has been going on for about 5 days. Went to PCP yesterday and confirmed positive hemoccult, wanted patient to come to the ER due to severe diarrhea which is causing weakness. Scheduled for endoscopy and colonoscopy on 12/07/23. Due to increase amount of bowel movements and weakness, PCP sent him to the ER.   Allergies Allergies  Allergen Reactions   Gluten Meal     Celiac Disease   Wheat Other (See Comments)    Celiac Disease    Level of Care/Admitting Diagnosis ED Disposition     ED Disposition  Admit   Condition  --   Comment  Hospital Area: [facility] [[id number]]  Level of Care: Med-Surg [16]  May admit patient to [facility] or [facility] if equivalent level of care is available:: Yes  Covid Evaluation: Asymptomatic - no recent exposure (last 10 days) testing not required  Diagnosis: Diarrhea with dehydration [914782]  Admitting Physician: Briscoe Deutscher [[id number]]  Attending Physician: Briscoe Deutscher [[id number]]  Certification:: I certify this patient will need inpatient services for at least 2 midnights  Expected Medical Readiness: 11/30/2023          B Medical/Surgery History Past Medical History:  Diagnosis Date   Anemia    Arthritis    osteoarthritis  back   Celiac disease    Chronic kidney disease    Collagenous colitis    Diverticulosis    Enlarged prostate    followed by dr  [name]   Esophageal dysmotility    Esophageal dysmotility    Esophageal stenosis    Essential hypertension, benign  03/29/2014   External hemorrhoids    External hemorrhoids    Full dentures    GERD (gastroesophageal reflux disease)    Hemorrhoids    Hiatal hernia    IBS (irritable bowel syndrome)    Internal hemorrhoids    OSA on CPAP 10/17/2018   Osteoporosis    Postoperative anemia due to acute blood loss 03/29/2014   Schatzki's ring    Sleep apnea    CPAP Machine   Ulcer    Past Surgical History:  Procedure Laterality Date   APPENDECTOMY  1963   CARDIOVERSION N/A 12/16/2022   Procedure: CARDIOVERSION;  Surgeon: Lewayne Bunting, MD;  Location: [facility] ENDOSCOPY;  Service: Cardiovascular;  Laterality: N/A;   ESOPHAGOGASTRODUODENOSCOPY  01/09/2022   EYE SURGERY Bilateral    cataract removal - implants   HERNIA REPAIR  2002   right inguinal    HIP SURGERY Left    INGUINAL HERNIA REPAIR  08/19/2011   Procedure: HERNIA REPAIR  left INGUINAL ADULT;  Surgeon: Adolph Pollack, MD;  Location: [facility] ORS;  Service: General;  Laterality: Left;   INTRAMEDULLARY (IM) NAIL INTERTROCHANTERIC Left 03/27/2014   Procedure: INTRAMEDULLARY (IM) NAIL INTERTROCHANTRIC;  Surgeon: Shelda Pal, MD;  Location: [facility] ORS;  Service: Orthopedics;  Laterality: Left;   RIGHT/LEFT HEART CATH AND CORONARY ANGIOGRAPHY N/A 03/12/2017   Procedure: Right/Left Heart Cath and Coronary Angiography;  Surgeon: Dolores Patty, MD;  Location: [facility] INVASIVE CV LAB;  Service: Cardiovascular;  Laterality: N/A;   TEE WITHOUT CARDIOVERSION N/A 12/16/2022   Procedure: TRANSESOPHAGEAL ECHOCARDIOGRAM (TEE);  Surgeon: Lewayne Bunting, MD;  Location: [facility] ENDOSCOPY;  Service: Cardiovascular;  Laterality: N/A;   UPPER GASTROINTESTINAL ENDOSCOPY  2008     A IV Location/Drains/Wounds Patient Lines/Drains/Airways Status     Active Line/Drains/Airways     Name Placement date Placement time Site Days   Peripheral IV 11/27/23 18 G Anterior;Distal;Left;Upper Arm 11/27/23  1609  Arm  less than 1   Airway 03/27/14  2136  -- 3532   Airway 03/27/14  2228   -- 3532            Intake/Output Last 24 hours  Intake/Output Summary (Last 24 hours) at 11/27/2023 1746 Last data filed at 11/27/2023 1721 Gross per 24 hour  Intake 50 ml  Output --  Net 50 ml    Labs/Imaging Results for orders placed or performed during the hospital encounter of 11/27/23 (from the past 48 hours)  Comprehensive metabolic panel     Status: Abnormal   Collection Time: 11/27/23  3:26 PM  Result Value Ref Range   Sodium 136 135 - 145 mmol/L   Potassium 4.4 3.5 - 5.1 mmol/L   Chloride 101 98 - 111 mmol/L   CO2 24 22 - 32 mmol/L   Glucose, Bld 89 70 - 99 mg/dL    Comment: Glucose reference range applies only to samples taken after fasting for at least 8 hours.   BUN 16 8 - 23 mg/dL   Creatinine, Ser 0.98 (H) 0.61 - 1.24 mg/dL   Calcium 9.2 8.9 - 11.9 mg/dL   Total Protein 7.3 6.5 - 8.1 g/dL   Albumin 3.7 3.5 - 5.0 g/dL   AST 17 15 - 41 U/L   ALT 14 0 - 44 U/L   Alkaline Phosphatase 57 38 - 126 U/L   Total Bilirubin 0.5 0.0 - 1.2 mg/dL   GFR, Estimated 56 (L) >60 mL/min    Comment: (NOTE) Calculated using the CKD-EPI Creatinine Equation (2021)    Anion gap 11 5 - 15    Comment: Performed at [facility], [address], [place], [place] [address]  CBC     Status: Abnormal   Collection Time: 11/27/23  3:26 PM  Result Value Ref Range   WBC 9.7 4.0 - 10.5 K/uL   RBC 4.01 (L) 4.22 - 5.81 MIL/uL   Hemoglobin 12.5 (L) 13.0 - 17.0 g/dL   HCT 95.6 21.3 - 08.6 %   MCV 99.0 80.0 - 100.0 fL   MCH 31.2 26.0 - 34.0 pg   MCHC 31.5 30.0 - 36.0 g/dL   RDW 57.8 46.9 - 62.9 %   Platelets 240 150 - 400 K/uL   nRBC 0.0 0.0 - 0.2 %    Comment: Performed at [facility], [address] [place], [place] [address]  Magnesium     Status: None   Collection Time: 11/27/23  3:26 PM  Result Value Ref Range   Magnesium 2.0 1.7 - 2.4 mg/dL    Comment: Performed at [facility], [address] [place], [place]  [address]  Type and screen [facility]     Status: None   Collection Time: 11/27/23  3:53 PM  Result Value Ref Range   ABO/RH(D) B POS    Antibody Screen NEG    Sample Expiration      11/30/2023,2359 Performed at [facility], [address] [place], [place] [address]   Vitamin B12     Status: None   Collection Time: 11/27/23  3:53 PM  Result Value Ref Range   Vitamin B-12 701 180 - 914 pg/mL    Comment: (NOTE) This assay is not validated for testing neonatal or myeloproliferative syndrome specimens for Vitamin B12 levels. Performed at [facility], [address] [place], [place] [address]    No results found.  Pending Labs Unresulted Labs (From admission, onward)     Start     Ordered   11/27/23 1542  Gliadin antibodies, serum  (Celiac Panel (PNL))  Once,   URGENT        11/27/23 1541   11/27/23 1542  Tissue transglutaminase, IgA  (Celiac Panel (PNL))  Once,   URGENT        11/27/23 1541   11/27/23 1542  Reticulin Antibody, IgA w reflex titer  (Celiac Panel (PNL))  Once,   URGENT        11/27/23 1541   11/27/23 1516  C Difficile Quick Screen w PCR reflex  (C Difficile quick screen w PCR reflex panel )  Once, for 24 hours,   URGENT       References:    CDiff Information Tool   11/27/23 1515            Vitals/Pain Today's Vitals   11/27/23 1418 11/27/23 1419 11/27/23 1717  BP:  (!) 141/82 (!) 177/96  Pulse:  85 77  Resp:  18 19  Temp:  (!) 97.5 F (36.4 C)   TempSrc:  Oral   SpO2:  95% 100%  Weight: 65.8 kg    Height: 5\' 6"  (1.676 m)    PainSc: 0-No pain      Isolation Precautions Enteric precautions (UV disinfection)  Medications Medications  famotidine (PEPCID) IVPB 20 mg premix (0 mg Intravenous Stopped 11/27/23 1721)    Mobility walks     Focused Assessments  Blood Stools, history of c-diff earlier this year recent antibiotics.    R Recommendations: See Admitting Provider  Note  Report given to:   Additional Notes:

## 2023-11-27 NOTE — Telephone Encounter (Signed)
   Patient Name: [name]  DOB: 10-09-1935 MRN: [id number]  Primary Cardiologist: Peter Swaziland, MD  Clinical pharmacists have reviewed the patient's past medical history, labs, and current medications as part of preoperative protocol coverage. The following recommendations have been made:   Per office protocol, patient can hold Eliquis for 2 days prior to procedure.    I will route this recommendation to the requesting party via Epic fax function and remove from pre-op pool.  Please call with questions.  Napoleon Form, Leodis Rains, NP 11/27/2023, 10:34 AM

## 2023-11-27 NOTE — ED Provider Notes (Signed)
[facility] AT [facility] Provider Note   CSN: [id number] Arrival date & time: 11/27/23  1338     History  Chief Complaint  Patient presents with   Rectal Bleeding   Weakness    [name] is a 89 y.o. male history of A-fib on Eliquis, peptic ulcer, anemia, Schatzki's ring, internal hemorrhoids presented for dark stools for the past 5 days.  Patient states that he has had diarrhea for the past month however past 5 days began having black stools.  Patient denies any chest pain abdominal pain shortness of breath fevers hematemesis or nausea vomiting.  Patient does take his Eliquis daily last his Eliquis this morning.  Patient went to his GI specialist yesterday and had positive occult test and was referred here.  Patient does not have prior history of GI bleed.  On their patient states he was recently on Augmentin for pneumonia about a month ago.  Patient does have history of C. difficile.   Home Medications Prior to Admission medications   Medication Sig Start Date End Date Taking? Authorizing Provider  acetaminophen (TYLENOL) 500 MG tablet Take 1,000 mg by mouth as needed for mild pain or moderate pain.    [provider]  albuterol (PROVENTIL HFA;VENTOLIN HFA) 108 (90 Base) MCG/ACT inhaler Inhale 2 puffs into the lungs every 6 (six) hours as needed for wheezing or shortness of breath.    [provider]  amLODipine (NORVASC) 5 MG tablet Take 5 mg by mouth daily.    [provider]  apixaban (ELIQUIS) 5 MG TABS tablet Take 1 tablet (5 mg total) by mouth 2 (two) times daily. 11/06/23   [place], Peter M, MD  diclofenac Sodium (VOLTAREN) 1 % GEL Apply 2 g topically 4 (four) times daily as needed (pain.).    [provider]  doxazosin (CARDURA) 8 MG tablet Take 4 mg by mouth daily.    [provider]  ferrous sulfate 325 (65 FE) MG tablet Take 325 mg by mouth at bedtime.    [provider]  flecainide  (TAMBOCOR) 50 MG tablet Take 50 mg by mouth 2 (two) times daily. Per patient taking 2 tablets in the morning and taking 2 tablets at night    [provider]  furosemide (LASIX) 20 MG tablet Take 1 tablet by mouth as needed or directed by the Heart Failure clinic 09/21/23   Bensimhon, Bevelyn Buckles, MD  gabapentin (NEURONTIN) 600 MG tablet Take 600 mg by mouth at bedtime. 11/28/22   [provider]  loratadine (CLARITIN) 10 MG tablet Take 10 mg by mouth in the morning.    [provider]  magnesium oxide (MAG-OX) 400 (240 Mg) MG tablet Take 400 mg by mouth in the morning.    [provider]  potassium chloride SA (KLOR-CON M) 20 MEQ tablet TAKE 1 TABLET EVERY DAY WITH SUPPER 11/11/23   Bensimhon, Bevelyn Buckles, MD  sertraline (ZOLOFT) 50 MG tablet Take 25 mg by mouth at bedtime.    [provider]  zolpidem (AMBIEN) 10 MG tablet Take 10 mg by mouth at bedtime as needed for sleep.    [provider]      Allergies    Gluten meal and Wheat    Review of Systems   Review of Systems  Neurological:  Positive for weakness.    Physical Exam Updated Vital Signs BP (!) 177/96 (BP Location: Right Arm)   Pulse 77   Temp (!) 97.5  F (36.4 C) (Oral)   Resp 19   Ht 5\' 6"  (1.676 m)   Wt 65.8 kg   SpO2 100%   BMI 23.41 kg/m  Physical Exam Constitutional:      General: He is not in acute distress. Cardiovascular:     Rate and Rhythm: Normal rate and regular rhythm.     Pulses: Normal pulses.     Heart sounds: Normal heart sounds.  Pulmonary:     Effort: Pulmonary effort is normal. No respiratory distress.     Breath sounds: Normal breath sounds.  Abdominal:     Palpations: Abdomen is soft.     Tenderness: There is no abdominal tenderness. There is no guarding or rebound.  Skin:    General: Skin is warm and dry.     Capillary Refill: Capillary refill takes less than 2 seconds.  Neurological:     Mental Status: He is alert and oriented to person,  place, and time.  Psychiatric:        Mood and Affect: Mood normal.     ED Results / Procedures / Treatments   Labs (all labs ordered are listed, but only abnormal results are displayed) Labs Reviewed  COMPREHENSIVE METABOLIC PANEL - Abnormal; Notable for the following components:      Result Value   Creatinine, Ser 1.25 (*)    GFR, Estimated 56 (*)    All other components within normal limits  CBC - Abnormal; Notable for the following components:   RBC 4.01 (*)    Hemoglobin 12.5 (*)    All other components within normal limits  C DIFFICILE QUICK SCREEN W PCR REFLEX    MAGNESIUM  VITAMIN B12  GLIADIN ANTIBODIES, SERUM  TISSUE TRANSGLUTAMINASE, IGA  RETICULIN ANTIBODIES, IGA W TITER  POC OCCULT BLOOD, ED  TYPE AND SCREEN    EKG None  Radiology No results found.  Procedures Procedures    Medications Ordered in ED Medications  famotidine (PEPCID) IVPB 20 mg premix (0 mg Intravenous Stopped 11/27/23 1721)    ED Course/ Medical Decision Making/ A&P                                 Medical Decision Making Amount and/or Complexity of Data Reviewed Labs: ordered.  Risk Prescription drug management. Decision regarding hospitalization.   Timothy Lasso 89 y.o. presented today for GIB. Working DDx that I considered at this time includes, but not limited to, Esophagitis, Mallory Weiss/Boerhaave, Variceal bleeding, PUD/gastritis/ulcers, diverticular bleed, colon cancer, rectal bleed, internal/external hemorrhoids  R/o DDx: Esophagitis, Mallory Weiss/Boerhaave, Variceal bleeding, diverticular bleed, colon cancer, rectal bleed, external hemorrhoids: These are considered less likely due to history of present illness, physical exam, labs/imaging findings  Review of prior external notes: 11/26/2023 office visit  Unique Tests and My Independent Interpretation:  CBC: Unremarkable, stable hemoglobin CMP: Unremarkable Type and screen: B+, antibody negative Mag:  Unremarkable C. difficile: Pending B12: Unremarkable IgA: Pending Tissue transglutaminase: Unremarkable Gliadin antibodies: Pending  Social Determinants of Health: none  Discussion with Independent Historian:  Daughter  Discussion of Management of Tests:  [place], PA-C GI ; Opyd, MD Hospitalist  Risk: High: hospitalization or escalation of hospital-level care  Risk Stratification Score: None  Plan: On exam patient was no acute distress stable vitals.. Physical exam showed no acute findings.  Patient had positive Hemoccult yesterday and so we will not repeat this.  Going to GIs note from yesterday they  did not want a Protonix as they suspect patient may have some sort of colitis.  Dr. Rhea Belton from [facility] referred patient to the ED and so we will consult him as he is on-call to determine plan.  Will obtain basic labs and type and screen.  I spoke to the [facility] PA and she states they will come down to see the patient.  She recommends adding on a celiac panel along with B12 which we will add.  GI the patient at the bedside and stated that patient could be admitted and that they will reevaluate him in the morning to see if they need to move up that scopes.  I spoke to the hospitalist patient was accepted for admission.  GI recommends IV iron and fluids in the meantime.  Patient stable to be admitted.  This chart was dictated using voice recognition software.  Despite best efforts to proofread,  errors can occur which can change the documentation meaning.        Final Clinical Impression(s) / ED Diagnoses Final diagnoses:  Upper GI bleed    Rx / DC Orders ED Discharge Orders     None         Remi Deter 11/27/23 1742    Benjiman Core, MD 11/27/23 2342

## 2023-11-27 NOTE — Telephone Encounter (Signed)
Inbound call from patient's daughter stating patient is having black stool and is feeling very weak. Requesting a call back. Please advise, thank you.

## 2023-11-27 NOTE — Consult Note (Signed)
Consultation  Referring Provider:   ER Primary Care Physician:  Charles Greathouse, MD Primary Gastroenterologist:  Dr. Rhea Prince       Reason for Consultation:   Diarrhea with melenic stools DOA: 11/27/2023         Hospital Day: 1         HPI:   [name] is a 89 y.o. male with past medical history significant for GERD, esophageal stenosis prior dilation, esophageal dysmotility, prior esophageal candidiasis celiac disease on gluten-free diet, collagenous colitis in remission previously responded to budesonide, IBS, diverticulosis, A-fib on Eliquis, status post cardioversion 12/16/2022 after TEE secondary to acute heart failure induced A-fib on Eliquis, history of C. difficile May 2024.  Patient was seen yesterday in our office by [facility] NP for diarrhea with melenic stools.  Patient complaining of diarrhea for a month up to 10 stools daily with urgency, fecal incontinence, abdominal discomfort.  Taking Imodium 1 capsule daily and Pepto-Bismol.  Having dark tarry stools 3 days ago, on iron supplementation.  Recent addition of sertraline. Reports had check of C. difficile with primary care 11/16/23 but unable to see report. Hgb 12.8 compared to 13.25 months ago and 11.7 2 months ago, MCV 96.6, normal kidney and liver with normal potassium, magnesium. Iron 22, saturation 7.6, ferritin 120 Last TTG IgA normal 06/2019 Patient was set up for EGD, colonoscopy with [name] 2/24 for diarrhea and melenic stools with ongoing dysphagia. Patient had worsening weakness and so presented to the ER.  Work up notable for : Hgb 12.5 compared to 12.8 and 11.7, normal platelets, no leukocytosis BUN 16, creatinine 1.25, normal electrolytes unremarkable liver function Pending C. difficile, TTG, B12  Patient with family at bedside, Daughter [name]. Provided some of the history.  Patient has been having diarrhea for a month, can have up to 10 times a day or every hour to hour and a half.  Can  have urgency, nocturnal symptoms. Patient denies abdominal pain. Denies any associated weight loss. Patient denies nausea vomiting, does have dysphagia which she has had for over a year worse with breads and meats. Patient mentions he has also been having pneumonia, treated twice since May 2024 last time he had antibiotics was January.  He also describes intermittent oropharyngeal dysphagia with spit or liquids or even cornbread feeling he is going down the wrong pipe.  This is more intermittent and can happen once every 2 weeks.  Patient did have 6 bottom teeth extracted several weeks ago, no antibiotics for that he does not take any anti-inflammatories. Started on 25 mg Zoloft at night unknown amount of time. Patient has been on iron daily stopped just yesterday, patient has been on B12 daily and was on Pepto-Bismol off-and-on for the past week stopped yesterday. Patient noticed dark black stools over the last 5 days. Patient used to have a very strict adherent gluten-free diet but over the last year patient's been introducing more gluten items such as cornbread and excetra. Denies any rashes or joint pain.   Per PCP CDF was neg 11/16/23 02/17/2023 echo-Left ventricular ejection fraction, by estimation, is 55 to 60%.  01/09/2022 upper endoscopy Impression:  - LA Grade A reflux esophagitis with no bleeding.  - Benign- appearing esophageal stenosis. Dilated to 16 mm with balloon with moderate mucosal disruption.  - 1 cm hiatal hernia.  - Normal stomach.  - Normal examined duodenum.  - No specimens collected. 04/19/2020 upper endoscopy Impression:  - Esophageal plaques  were found, consistent with candidiasis.  - Benign- appearing esophageal stenosis. Dilated to 17 mm with balloon.  - Normal stomach.  - Two duodenal polyps. Resected and retrieved. Path:Diagnosis Surgical [P], duodenal polyps - DUODENAL MUCOSA WITH HYPEREMIA AND DILATED VESSELS. - NO FEATURES OF SPRUE, ADENOMATOUS CHANGE OR  CARCINOMA. - SEE MICROSCOPIC DESCRIPTION 06/16/2019 colonoscopy Impression:  - The examined portion of the ileum was normal. - Moderate diverticulosis in the left colon. - External hemorrhoids. - The examination was otherwise normal on direct and retroflexion views. Biopsies obtained. Path:Diagnosis Surgical [P], random sites - COLLAGENOUS COLITIS Abnormal Labs Reviewed - No abnormal labs to display  Past Medical History:  Diagnosis Date   Anemia    Arthritis    osteoarthritis  back   Celiac disease    Chronic kidney disease    Collagenous colitis    Diverticulosis    Enlarged prostate    followed by dr  [name]   Esophageal dysmotility    Esophageal dysmotility    Esophageal stenosis    Essential hypertension, benign 03/29/2014   External hemorrhoids    External hemorrhoids    Full dentures    GERD (gastroesophageal reflux disease)    Hemorrhoids    Hiatal hernia    IBS (irritable bowel syndrome)    Internal hemorrhoids    OSA on CPAP 10/17/2018   Osteoporosis    Postoperative anemia due to acute blood loss 03/29/2014   Schatzki's ring    Sleep apnea    CPAP Machine   Ulcer     Surgical History:  He  has a past surgical history that includes Appendectomy (1963); Hernia repair (2002); Inguinal hernia repair (08/19/2011); Eye surgery (Bilateral); Intramedullary (im) nail intertrochanteric (Left, 03/27/2014); RIGHT/LEFT HEART CATH AND CORONARY ANGIOGRAPHY (N/A, 03/12/2017); Hip surgery (Left); Upper gastrointestinal endoscopy (2008); Esophagogastroduodenoscopy (01/09/2022); TEE without cardioversion (N/A, 12/16/2022); and Cardioversion (N/A, 12/16/2022). Family History:  His family history includes Brain cancer in his brother; Breast cancer in his sister; Cervical cancer in his mother; Colon cancer in his mother; Heart attack in his father; Liver cancer in his brother; Stroke in his father; Throat cancer in his brother. Social History:   reports that he quit smoking about 42 years  ago. His smoking use included cigarettes. He has never used smokeless tobacco. He reports current alcohol use. He reports that he does not use drugs.  Prior to Admission medications   Medication Sig Start Date End Date Taking? Authorizing Provider  acetaminophen (TYLENOL) 500 MG tablet Take 1,000 mg by mouth as needed for mild pain or moderate pain.    [provider]  albuterol (PROVENTIL HFA;VENTOLIN HFA) 108 (90 Base) MCG/ACT inhaler Inhale 2 puffs into the lungs every 6 (six) hours as needed for wheezing or shortness of breath.    [provider]  amLODipine (NORVASC) 5 MG tablet Take 5 mg by mouth daily.    [provider]  apixaban (ELIQUIS) 5 MG TABS tablet Take 1 tablet (5 mg total) by mouth 2 (two) times daily. 11/06/23   [place], Peter M, MD  diclofenac Sodium (VOLTAREN) 1 % GEL Apply 2 g topically 4 (four) times daily as needed (pain.).    [provider]  doxazosin (CARDURA) 8 MG tablet Take 4 mg by mouth daily.    [provider]  ferrous sulfate 325 (65 FE) MG tablet Take 325 mg by mouth at bedtime.    [provider]  flecainide (TAMBOCOR) 50 MG tablet Take 50 mg by mouth 2 (  two) times daily. Per patient taking 2 tablets in the morning and taking 2 tablets at night    [provider]  furosemide (LASIX) 20 MG tablet Take 1 tablet by mouth as needed or directed by the Heart Failure clinic 09/21/23   Bensimhon, Bevelyn Buckles, MD  gabapentin (NEURONTIN) 600 MG tablet Take 600 mg by mouth at bedtime. 11/28/22   [provider]  loratadine (CLARITIN) 10 MG tablet Take 10 mg by mouth in the morning.    [provider]  magnesium oxide (MAG-OX) 400 (240 Mg) MG tablet Take 400 mg by mouth in the morning.    [provider]  potassium chloride SA (KLOR-CON M) 20 MEQ tablet TAKE 1 TABLET EVERY DAY WITH SUPPER 11/11/23   Bensimhon, Bevelyn Buckles, MD  sertraline (ZOLOFT) 50 MG tablet Take 25 mg by mouth at bedtime.     [provider]  zolpidem (AMBIEN) 10 MG tablet Take 10 mg by mouth at bedtime as needed for sleep.    [provider]    No current facility-administered medications for this encounter.   Current Outpatient Medications  Medication Sig Dispense Refill   acetaminophen (TYLENOL) 500 MG tablet Take 1,000 mg by mouth as needed for mild pain or moderate pain.     albuterol (PROVENTIL HFA;VENTOLIN HFA) 108 (90 Base) MCG/ACT inhaler Inhale 2 puffs into the lungs every 6 (six) hours as needed for wheezing or shortness of breath.     amLODipine (NORVASC) 5 MG tablet Take 5 mg by mouth daily.     apixaban (ELIQUIS) 5 MG TABS tablet Take 1 tablet (5 mg total) by mouth 2 (two) times daily. 180 tablet 3   diclofenac Sodium (VOLTAREN) 1 % GEL Apply 2 g topically 4 (four) times daily as needed (pain.).     doxazosin (CARDURA) 8 MG tablet Take 4 mg by mouth daily.     ferrous sulfate 325 (65 FE) MG tablet Take 325 mg by mouth at bedtime.     flecainide (TAMBOCOR) 50 MG tablet Take 50 mg by mouth 2 (two) times daily. Per patient taking 2 tablets in the morning and taking 2 tablets at night     furosemide (LASIX) 20 MG tablet Take 1 tablet by mouth as needed or directed by the Heart Failure clinic 15 tablet 11   gabapentin (NEURONTIN) 600 MG tablet Take 600 mg by mouth at bedtime.     loratadine (CLARITIN) 10 MG tablet Take 10 mg by mouth in the morning.     magnesium oxide (MAG-OX) 400 (240 Mg) MG tablet Take 400 mg by mouth in the morning.     potassium chloride SA (KLOR-CON M) 20 MEQ tablet TAKE 1 TABLET EVERY DAY WITH SUPPER 90 tablet 1   sertraline (ZOLOFT) 50 MG tablet Take 25 mg by mouth at bedtime.     zolpidem (AMBIEN) 10 MG tablet Take 10 mg by mouth at bedtime as needed for sleep.      Allergies as of 11/27/2023 - Review Complete 11/27/2023  Allergen Reaction Noted   Gluten meal  05/27/2023   Wheat Other (See Comments) 11/18/2021    Review of Systems:    Constitutional: No  weight loss, fever, chills, weakness or fatigue HEENT: Eyes: No change in vision               Ears, Nose, Throat:  No change in hearing or congestion Skin: No rash or itching Cardiovascular: No chest pain, chest pressure or palpitations  Respiratory: No SOB or cough Gastrointestinal: See HPI and otherwise negative Genitourinary: No dysuria or change in urinary frequency Neurological: No headache, dizziness or syncope Musculoskeletal: No new muscle or joint pain Hematologic: No bleeding or bruising Psychiatric: No history of depression or anxiety     Physical Exam:  Vital signs in last 24 hours: Temp:  [97.5 F (36.4 C)] 97.5 F (36.4 C) (02/14 1419) Pulse Rate:  [85] 85 (02/14 1419) Resp:  [18] 18 (02/14 1419) BP: (141)/(82) 141/82 (02/14 1419) SpO2:  [95 %] 95 % (02/14 1419) Weight:  [65.8 kg] 65.8 kg (02/14 1418)   Last BM recorded by nurses in past 5 days No data recorded  General:   Pleasant, well developed male in no acute distress Head:  Normocephalic and atraumatic. Eyes: sclerae anicteric,conjunctive pink  Heart:  regular rate and rhythm, no murmurs or gallops Pulm: Clear anteriorly; no wheezing Abdomen:  Soft, Obese AB, Active bowel sounds. No tenderness . Without guarding and Without rebound, No organomegaly appreciated. Extremities:  Without edema. Msk:  Symmetrical without gross deformities. Peripheral pulses intact.  Neurologic:  Alert and  oriented x4;  No focal deficits.  Skin:   Dry and intact without significant lesions or rashes. Psychiatric:  Cooperative. Normal mood and affect.  LAB RESULTS: Recent Labs    11/26/23 1151  WBC 7.1  HGB 12.8*  HCT 39.3  PLT 233.0   BMET Recent Labs    11/26/23 1151  NA 136  K 3.8  CL 100  CO2 25  GLUCOSE 90  BUN 16  CREATININE 1.37  CALCIUM 9.2   LFT Recent Labs    11/26/23 1151  PROT 7.5  ALBUMIN 4.2  AST 16  ALT 10  ALKPHOS 57  BILITOT 0.3   PT/INR No results for input(s): "LABPROT", "INR"  in the last 72 hours.  STUDIES: No results found.    Impression    Iron deficiency anemia, melenic stools, on Eliquis, HD stable 11/26/2023  HGB 12.8 MCV 96.6 Platelets 233.0 11/26/2023 Iron 22 Ferritin 120.8  05/27/2023 B12 239 Recent Labs    12/10/22 9629 12/24/22 0954 01/28/23 1059 05/26/23 1519 05/27/23 0355 06/05/23 1027 09/17/23 1534 11/26/23 1151  HGB 11.1* 11.0* 12.0* 12.5* 12.4* 13.2 11.7* 12.8*   patient complaining of melenic stools, no elevation of BUN, patient is on iron and has been taking Pepto-Bismol Last endoscopy 01/09/2022 mild esophagitis status post dilation Colonoscopy 06/16/2019 normal TI diverticulosis, external hemorrhoids, positive for collagenous colitis Overall HD and Hgb stable, melenic stools could be potentially from Pepto-Bismol use Patient also previously had a B12 deficiency in the past.   Diarrhea x 1 month Cdiff May 2024, recheck neg per PCP 02/03 not able to see report History of celiac disease, no recent TTG History of collagenous colitis on colonoscopy 2020 responded to budesonide has been in remission has had recent addition of SSRI  Atrial fibrillation  status post cardioversion 12/2022 on flecainide normal sinus rhythm on Eliquis last dose this morning  Dysphagia Longstanding history, with worse with breads and meats last dilation 2023.  Recurrent pneumonia x 2 since May with some reports of intermittent oropharyngeal dysphagia and possible aspiration Last pneumonia treated January 2025   Active Problems:   * No active hospital problems. *    LOS: 0 days     Plan   89 year old male with history of A-fib on Eliquis last dose this morning, C. difficile May 2024 last antibiotics January for pneumonia, history of celiac disease has been noncompliant  with gluten-free diet last year, history of collagenous colitis last colonoscopy 2020 on SSRI, presents with weakness.  Found to have IDA and slight dehydration compared to labs  yesterday. Overall labs and HD stable, slight AKI  -Check C. difficile with history in May and recent antibiotic use in January, treat if positive and consider Vowst for prevention -Check TTG as patient admits to not adhering to gluten-free diet, could be contributing to IDA, very suspicious for sprue diarrhea -History of collagenous colitis with reintroduction of SSRI, consider repeat colonoscopy or trial of budesonide with history. -Has been on oral iron outpatient with continuing iron deficiency anemia, IV iron this hospitalization. -Check B12 has history of deficiency and had some macrocytosis -Normal BUN, stable hemoglobin, dark stools could be secondary to Pepto/iron which is on hold - monitor CBC, transfuse greater than 7 -Patient with longstanding history of dysphagia, last dose of Eliquis was 2/14 in the morning, patient high risk for endoscopic evaluation with age and comorbid conditions, would like to have dilation, would need least 48-hour washout, discussed potentially monitoring patient until Monday and proceeding with EGD with dilation plus or minus colonoscopy. -Patient is also had 2 episodes of pneumonia, describes some episodes of potential aspiration/oropharyngeal dysphagia consider modified barium swallow.  Discussed with the patient and daughter, pending labs and how patient is feeling overall with IV iron and rehydration can consider discharge with planned evaluation with EGD/colon Outpatient on the 24th or if patient continues to feel poorly or wants to continue holding Eliquis with EGD with dilation plus or minus colonoscopy Monday can proceed with that. -Continue to hold Eliquis -Dr. Loreta Ave and Dr. Elnoria Howard will be covering for [facility] over the weekend.   Thank you for your kind consultation, we will continue to follow.   Doree Albee  11/27/2023, 3:15 PM

## 2023-11-27 NOTE — Progress Notes (Signed)
Addendum: Reviewed and agree with assessment and management plan. Contacted by patient's daughter who states patient has continued to look unwell clinically and he has been instructed to go to the [facility] emergency department for further care.  I am in agreement with this plan. Oziel Beitler, Carie Caddy, MD

## 2023-11-28 DIAGNOSIS — R197 Diarrhea, unspecified: Secondary | ICD-10-CM | POA: Diagnosis not present

## 2023-11-28 LAB — CBC
HCT: 34.6 % — ABNORMAL LOW (ref 39.0–52.0)
Hemoglobin: 11.1 g/dL — ABNORMAL LOW (ref 13.0–17.0)
MCH: 31.2 pg (ref 26.0–34.0)
MCHC: 32.1 g/dL (ref 30.0–36.0)
MCV: 97.2 fL (ref 80.0–100.0)
Platelets: 252 10*3/uL (ref 150–400)
RBC: 3.56 MIL/uL — ABNORMAL LOW (ref 4.22–5.81)
RDW: 14.2 % (ref 11.5–15.5)
WBC: 9.8 10*3/uL (ref 4.0–10.5)
nRBC: 0 % (ref 0.0–0.2)

## 2023-11-28 LAB — BASIC METABOLIC PANEL
Anion gap: 8 (ref 5–15)
BUN: 9 mg/dL (ref 8–23)
CO2: 24 mmol/L (ref 22–32)
Calcium: 8.6 mg/dL — ABNORMAL LOW (ref 8.9–10.3)
Chloride: 104 mmol/L (ref 98–111)
Creatinine, Ser: 1.06 mg/dL (ref 0.61–1.24)
GFR, Estimated: >60 mL/min (ref >60)
Glucose, Bld: 90 mg/dL (ref 70–99)
Potassium: 3.8 mmol/L (ref 3.5–5.1)
Sodium: 136 mmol/L (ref 135–145)

## 2023-11-28 MED ORDER — BUDESONIDE 3 MG PO CPEP
9.0000 mg | ORAL_CAPSULE | Freq: Every day | ORAL | Status: DC
  Administered 2023-11-28 – 2023-11-30 (×3): 9 mg via ORAL
  Filled 2023-11-28 (×3): qty 3

## 2023-11-28 NOTE — Progress Notes (Addendum)
 PROGRESS NOTE    [name]   DGU:[id number] DOB: [birth year]  DOA: 11/27/2023 Date of Service: 11/28/23 which is hospital day 1  PCP: Chilton Greathouse, MD    Hospital course / significant events:   HPI: Patient continues to have several loose tarry black bowel movements.  He reports his symptoms started about 5 weeks ago after he finished a course of antibiotics for pneumonia. There is no history of hematochezia but stools have been black and tarry. He feels his abdomen is distended which is not normal for him.He denies having any abdominal pain.  Per GI, clinical picture is complicated by the fact that he has been on iron supplements and Pepto-Bismol which can cause black stool and which have been held since admission, also noncompliant w/ celiac diet, taking magnesium, taking sertraline - all this can also cause loose BM. Cdiff not detected. GI planninf EGD/colonoscopy Monday 02/17     Consultants:  Gastroenterology  Procedures/Surgeries: none      ASSESSMENT & PLAN:   Change in bowel habits with diarrhea and black tarry stools iron deficiency anemia and has had open 1.7 gm drop in his hemoglobin since 11/26/23 suspicious for possible GIB history of collagenous colitis and celiac sprue-patient  start Budesonide 9 milligrams per day to see if this helps his diarrhea.  has not been compliant with a gluten-free diet as per his daughter --> gluten free diet reinforced  Per GI, would be prudent to do an EGD and a colonoscopy on 11/30/2023.  Holding Eliquis    GERD/history of esophageal dysmotility dysphagia with a history of esophageal stenosis [s/p dilation] and LA grade a esophagitis in 2023.  PPI  PAF  Hold Eliquis (last dose was 02/14 morning) continue flecainide     Chronic HFpEF  Hold Lasix, monitor volume status     CKD 3A  Appears close to baseline  Renally-dose medications    OSA  CPAP while sleeping     Generalized weakness/fatigue  Hydrate with  IVF, monitor H&H, consult PT         DVT prophylaxis: SCD IV fluids: no continuous IV fluids  Nutrition: gluten-free full liquid Central lines / invasive devices: none  Code Status: FULL CODE ACP documentation reviewed:  none on file in [facility]  TOC needs: TBD Barriers to dispo / significant pending items: scope on Mon             Subjective / Brief ROS:  Patient reports still some loose stool, dark Denies CP/SOB.  Pain controlled.  Denies new weakness.  Tolerating diet.   Family Communication: family at bedside on rounds     Objective Findings:  Vitals:   11/27/23 1911 11/27/23 2250 11/28/23 0512 11/28/23 1230  BP: (!) 163/94 (!) 144/68 (!) 147/65 (!) 157/78  Pulse: 82 78 78 78  Resp:  18 17   Temp:  99.4 F (37.4 C) 97.7 F (36.5 C) 97.8 F (36.6 C)  TempSrc:  Oral Oral Oral  SpO2:  91% 95% 94%  Weight:      Height:        Intake/Output Summary (Last 24 hours) at 11/28/2023 1437 Last data filed at 11/28/2023 0308 Gross per 24 hour  Intake 191.51 ml  Output --  Net 191.51 ml   Filed Weights   11/27/23 1418  Weight: 65.8 kg    Examination:  Physical Exam Constitutional:      General: He is not in acute distress.    Appearance: He is not ill-appearing.  Cardiovascular:     Rate and Rhythm: Normal rate and regular rhythm.  Pulmonary:     Effort: Pulmonary effort is normal.     Breath sounds: Normal breath sounds.  Abdominal:     General: Bowel sounds are increased.     Palpations: Abdomen is soft.  Skin:    General: Skin is warm and dry.     Findings: No rash.  Neurological:     General: No focal deficit present.     Mental Status: He is alert and oriented to person, place, and time. Mental status is at baseline.  Psychiatric:        Mood and Affect: Mood normal.        Behavior: Behavior normal.          Scheduled Medications:   budesonide  9 mg Oral Daily   doxazosin  4 mg Oral Daily   flecainide  100 mg Oral BID    sertraline  25 mg Oral QHS    Continuous Infusions:    PRN Medications:  acetaminophen **OR** acetaminophen, albuterol, hydrALAZINE, prochlorperazine, zolpidem  Antimicrobials from admission:  Anti-infectives (From admission, onward)    None           Data Reviewed:  I have personally reviewed the following...  CBC: Recent Labs  Lab 11/26/23 1151 11/27/23 1526 11/28/23 0439  WBC 7.1 9.7 9.8  NEUTROABS 4.7  --   --   HGB 12.8* 12.5* 11.1*  HCT 39.3 39.7 34.6*  MCV 96.6 99.0 97.2  PLT 233.0 240 252   Basic Metabolic Panel: Recent Labs  Lab 11/26/23 1151 11/27/23 1526 11/28/23 0439  NA 136 136 136  K 3.8 4.4 3.8  CL 100 101 104  CO2 25 24 24   GLUCOSE 90 89 90  BUN 16 16 9   CREATININE 1.37 1.25* 1.06  CALCIUM 9.2 9.2 8.6*  MG 2.2 2.0  --    GFR: Estimated Creatinine Clearance: 45.1 mL/min (by C-G formula based on SCr of 1.06 mg/dL). Liver Function Tests: Recent Labs  Lab 11/26/23 1151 11/27/23 1526  AST 16 17  ALT 10 14  ALKPHOS 57 57  BILITOT 0.3 0.5  PROT 7.5 7.3  ALBUMIN 4.2 3.7   No results for input(s): "LIPASE", "AMYLASE" in the last 168 hours. No results for input(s): "AMMONIA" in the last 168 hours. Coagulation Profile: No results for input(s): "INR", "PROTIME" in the last 168 hours. Cardiac Enzymes: No results for input(s): "CKTOTAL", "CKMB", "CKMBINDEX", "TROPONINI" in the last 168 hours. BNP (last 3 results) Recent Labs    12/10/22 0938  PROBNP 2,546*   HbA1C: No results for input(s): "HGBA1C" in the last 72 hours. CBG: No results for input(s): "GLUCAP" in the last 168 hours. Lipid Profile: No results for input(s): "CHOL", "HDL", "LDLCALC", "TRIG", "CHOLHDL", "LDLDIRECT" in the last 72 hours. Thyroid Function Tests: No results for input(s): "TSH", "T4TOTAL", "FREET4", "T3FREE", "THYROIDAB" in the last 72 hours. Anemia Panel: Recent Labs    11/26/23 1151 11/27/23 1553  VITAMINB12  --  701  FERRITIN 120.8  --   TIBC  289.8  --   IRON 22*  --    Most Recent Urinalysis On File:     Component Value Date/Time   COLORURINE STRAW (A) 05/26/2023 2345   APPEARANCEUR CLEAR 05/26/2023 2345   LABSPEC 1.010 05/26/2023 2345   PHURINE 7.0 05/26/2023 2345   GLUCOSEU NEGATIVE 05/26/2023 2345   HGBUR NEGATIVE 05/26/2023 2345   BILIRUBINUR NEGATIVE 05/26/2023 2345   KETONESUR NEGATIVE  05/26/2023 2345   PROTEINUR NEGATIVE 05/26/2023 2345   UROBILINOGEN 0.2 04/24/2009 1100   NITRITE NEGATIVE 05/26/2023 2345   LEUKOCYTESUR NEGATIVE 05/26/2023 2345   Sepsis Labs: @LABRCNTIP (procalcitonin:4,lacticidven:4) Microbiology: Recent Results (from the past 240 hours)  C Difficile Quick Screen w PCR reflex     Status: None   Collection Time: 11/27/23  5:18 PM   Specimen: STOOL  Result Value Ref Range Status   C Diff antigen NEGATIVE NEGATIVE Final   C Diff toxin NEGATIVE NEGATIVE Final   C Diff interpretation No C. difficile detected.  Final    Comment: NEGATIVE Performed at [facility], [address] [place], [place] [address]       Radiology Studies last 3 days: No results found.      Sunnie Nielsen, DO [facility] 11/28/2023, 2:37 PM    Dictation software may have been used to generate the above note. Typos may occur and escape review in typed/dictated notes. Please contact Dr Lyn Hollingshead directly for clarity if needed.  Staff may message me via secure chat in Epic  but this may not receive an immediate response,  please page me for urgent matters!  If 7PM-7AM, please contact night coverage [email or web address]

## 2023-11-28 NOTE — Progress Notes (Signed)
 [name] Subjective: Patient continues to have several loose tarry black bowel movements with 5 BMs through the night and 3 BMs this morning.  He claims his symptoms started about 5 weeks ago after he finished a course of antibiotics for pneumonia. There is no history of hematochezia but stools have been black and tarry complicated by the fact that he has been on iron supplements and Pepto-Bismol which have been held since admission. He feels his abdomen is distended which is not normal for him.He denies having any abdominal pain.  Stool studies are negative for C. difficile toxin assay.  He was also taking magnesium oxide at home which can cause diarrhea along with sertraline which can cause loose stools as well.  Objective: Vital signs in last 24 hours: Temp:  [97.5 F (36.4 C)-99.4 F (37.4 C)] 97.7 F (36.5 C) (02/15 0512) Pulse Rate:  [77-85] 78 (02/15 0512) Resp:  [16-19] 17 (02/15 0512) BP: (141-181)/(65-96) 147/65 (02/15 0512) SpO2:  [91 %-100 %] 95 % (02/15 0512) Weight:  [65.8 kg] 65.8 kg (02/14 1418) Last BM Date : 11/27/23  Intake/Output from previous day: 02/14 0701 - 02/15 0700 In: 191.5 [I.V.:141.5; IV Piggyback:50] Out: -  Intake/Output this shift: No intake/output data recorded.  General appearance: alert, cooperative, appears stated age, fatigued, no distress, and pale Resp: clear to auscultation bilaterally Cardio: regular rate and rhythm, S1, S2 normal, no murmur, click, rub or gallop GI: soft, non-tender; bowel sounds normal; no masses,  no organomegaly Extremities: extremities normal, atraumatic, no cyanosis or edema  Lab Results: Recent Labs    11/26/23 1151 11/27/23 1526 11/28/23 0439  WBC 7.1 9.7 9.8  HGB 12.8* 12.5* 11.1*  HCT 39.3 39.7 34.6*  PLT 233.0 240 252   BMET Recent Labs    11/26/23 1151 11/27/23 1526 11/28/23 0439  NA 136 136 136  K 3.8 4.4 3.8  CL 100 101 104  CO2 25 24 24   GLUCOSE 90 89 90  BUN 16 16 9   CREATININE  1.37 1.25* 1.06  CALCIUM 9.2 9.2 8.6*   LFT Recent Labs    11/27/23 1526  PROT 7.3  ALBUMIN 3.7  AST 17  ALT 14  ALKPHOS 57  BILITOT 0.5   C-Diff Recent Labs    11/27/23 1718  CDIFFTOX NEGATIVE   Studies/Results: No results found.  Medications: I have reviewed the patient's current medications. Prior to Admission:  Medications Prior to Admission  Medication Sig Dispense Refill Last Dose/Taking   acetaminophen (TYLENOL) 500 MG tablet Take 1,000 mg by mouth in the morning and at bedtime.   11/27/2023 Morning   albuterol (PROVENTIL HFA;VENTOLIN HFA) 108 (90 Base) MCG/ACT inhaler Inhale 2 puffs into the lungs every 6 (six) hours as needed for wheezing or shortness of breath.   Taking As Needed   amLODipine (NORVASC) 5 MG tablet Take 5 mg by mouth daily.   11/26/2023   apixaban (ELIQUIS) 5 MG TABS tablet Take 1 tablet (5 mg total) by mouth 2 (two) times daily. 180 tablet 3 11/27/2023 at  7:00 AM   chlorhexidine (PERIDEX) 0.12 % solution 15 mLs by Mouth Rinse route 3 (three) times daily after meals.   11/26/2023   diclofenac Sodium (VOLTAREN) 1 % GEL Apply 2 g topically 4 (four) times daily as needed (pain.).   Taking As Needed   doxazosin (CARDURA) 8 MG tablet Take 4 mg by mouth daily.   11/27/2023 Morning   flecainide (TAMBOCOR) 50 MG tablet Take 100 mg by mouth  in the morning and at bedtime.   11/27/2023 at  7:00 AM   furosemide (LASIX) 20 MG tablet Take 1 tablet by mouth as needed or directed by the Heart Failure clinic (Patient taking differently: Take 20 mg by mouth daily as needed for edema or fluid.) 15 tablet 11 Taking Differently   gabapentin (NEURONTIN) 600 MG tablet Take 600 mg by mouth at bedtime.   11/26/2023 Bedtime   loratadine (CLARITIN) 10 MG tablet Take 10 mg by mouth in the morning.   11/27/2023 Morning   magnesium oxide (MAG-OX) 400 (240 Mg) MG tablet Take 400 mg by mouth at bedtime.   11/26/2023 Bedtime   montelukast (SINGULAIR) 10 MG tablet Take 10 mg by mouth in the  morning.   11/27/2023 Morning   potassium chloride SA (KLOR-CON M) 20 MEQ tablet TAKE 1 TABLET EVERY DAY WITH SUPPER (Patient taking differently: Take 20 mEq by mouth every evening.) 90 tablet 1 11/26/2023 Evening   PRESCRIPTION MEDICATION CPAP- At bedtime   11/26/2023 Bedtime   sertraline (ZOLOFT) 50 MG tablet Take 25 mg by mouth at bedtime.   11/26/2023 Bedtime   SYSTANE HYDRATION PF 0.4-0.3 % SOLN Place 1 drop into both eyes 3 (three) times daily as needed (for dryness).   Past Week   zolpidem (AMBIEN) 10 MG tablet Take 10 mg by mouth at bedtime.   11/26/2023 Bedtime   Scheduled:  doxazosin  4 mg Oral Daily   flecainide  100 mg Oral BID   sertraline  25 mg Oral QHS   Continuous:  lactated ringers 75 mL/hr at 11/28/23 1610   RUE:[id number] **OR** acetaminophen, albuterol, hydrALAZINE, prochlorperazine, zolpidem  Assessment/Plan: 1) Change in bowel habits with diarrhea and black tarry stools/iron deficiency anemia/history of collagenous colitis and celiac sprue-patient has had open 1.7 gm drop in his hemoglobin since 11/26/23-I think it would be reasonable to start him on the Budesonide and 9 milligrams per day to see if this helps his diarrhea. Patient has not been compliant with a gluten-free diet as per his daughter and therefore he has ongoing problem with iron deficiency anemia.  It would be prudent to do an EGD and a colonoscopy on 11/30/2023. 2) GERD/history of esophageal dysmotility dysphagia with a history of esophageal stenosis [s/p dilation] and LA grade a esophagitis in 2023. 3) History of atrial fibrillation on Eliquis which is currently on hold. 4) Colonic diverticulosis on previous colonoscopy. 5) HTN. 6) OSA. 7) Chronic diastolic congestive heart failure. 8) Mitral regurgitation.  LOS: 1 day   [name] 11/28/2023, 8:20 AM

## 2023-11-28 NOTE — Evaluation (Signed)
 Physical Therapy One Time Evaluation and Discharge from Acute PT  Patient Details Name: [name] MRN: [id number] DOB: [birth year] Today's Date: 11/28/2023  History of Present Illness  89 y.o. male admitted 11/27/23 with change in bowel habits with diarrhea and black tarry stools. Past medical history significant for hypertension, atrial fibrillation on Eliquis, chronic HFpEF, OSA on CPAP, celiac disease, microscopic colitis, dysphagia, and C. difficile colitis, peripheral vertigo/vestibular neuritis  Clinical Impression  Patient evaluated by Physical Therapy with no further acute PT needs identified. All education has been completed and the patient has no further questions.  Pt only reports generalized weakness and fatigue due to several days of diarrhea.  Pt encouraged to continue mobilizing with staff as tolerated.  Will request mobility specialists work with pt this admission. No further follow-up Physical Therapy or equipment needs. PT is signing off. Thank you for this referral.         If plan is discharge home, recommend the following:     Can travel by private vehicle        Equipment Recommendations None recommended by PT  Recommendations for Other Services       Functional Status Assessment       Precautions / Restrictions        Mobility  Bed Mobility Overal bed mobility: Needs Assistance Bed Mobility: Supine to Sit     Supine to sit: Supervision          Transfers Overall transfer level: Needs assistance Equipment used: None Transfers: Sit to/from Stand Sit to Stand: Contact guard assist           General transfer comment: cues for hand placement for self assist    Ambulation/Gait Ambulation/Gait assistance: Contact guard assist Gait Distance (Feet): 200 Feet Assistive device: Rolling walker (2 wheels) Gait Pattern/deviations: Step-through pattern, Decreased stride length       General Gait Details: utilized RW today due to generalized  weakness and fatigue from diarrhea, stable with RW  Stairs            Wheelchair Mobility     Tilt Bed    Modified Rankin (Stroke Patients Only)       Balance Overall balance assessment: No apparent balance deficits (not formally assessed) (no hx of falls)                                           Pertinent Vitals/Pain Pain Assessment Pain Assessment: No/denies pain    Home Living Family/patient expects to be discharged to:: Private residence Living Arrangements: Spouse/significant other Available Help at Discharge: Family;Available PRN/intermittently;Available 24 hours/day Type of Home: House Home Access: Stairs to enter Entrance Stairs-Rails: Left Entrance Stairs-Number of Steps: 3   Home Layout: One level Home Equipment: Agricultural consultant (2 wheels);Cane - single point;Rollator (4 wheels);Shower seat;Toilet riser;Wheelchair - manual;BSC/3in1 Additional Comments: RW and rollator are his wife's    Prior Function Prior Level of Function : Independent/Modified Independent             Mobility Comments: Could ambulate in community without AD; no falls       Extremity/Trunk Assessment        Lower Extremity Assessment Lower Extremity Assessment: Generalized weakness    Cervical / Trunk Assessment Cervical / Trunk Assessment: Normal  Communication   Communication Communication: No apparent difficulties    Cognition Arousal: Alert Behavior During Therapy: [facility]  for tasks assessed/performed   PT - Cognitive impairments: No apparent impairments                         Following commands: Intact       Cueing       General Comments      Exercises     Assessment/Plan    PT Assessment Patient does not need any further PT services  PT Problem List         PT Treatment Interventions      PT Goals (Current goals can be found in the Care Plan section)  Acute Rehab PT Goals PT Goal Formulation: All assessment and  education complete, DC therapy    Frequency       Co-evaluation               AM-PAC PT "6 Clicks" Mobility  Outcome Measure Help needed turning from your back to your side while in a flat bed without using bedrails?: None Help needed moving from lying on your back to sitting on the side of a flat bed without using bedrails?: None Help needed moving to and from a bed to a chair (including a wheelchair)?: None Help needed standing up from a chair using your arms (e.g., wheelchair or bedside chair)?: None Help needed to walk in hospital room?: A Little Help needed climbing 3-5 steps with a railing? : A Little 6 Click Score: 22    End of Session Equipment Utilized During Treatment: Gait belt Activity Tolerance: Patient tolerated treatment well Patient left: in chair;with call bell/phone within reach;with chair alarm set;with family/visitor present Nurse Communication: Mobility status PT Visit Diagnosis: Difficulty in walking, not elsewhere classified (R26.2)    Time: 1610-9604 PT Time Calculation (min) (ACUTE ONLY): 10 min   Charges:   PT Evaluation $PT Eval Low Complexity: 1 Low   PT General Charges $$ ACUTE PT VISIT: 1 Visit        Paulino Door, DPT Physical Therapist Acute Rehabilitation Services Office: [phone]   Janan Halter Payson 11/28/2023, 3:05 PM

## 2023-11-28 NOTE — Hospital Course (Addendum)
 Hospital course / significant events:   HPI: Patient continues to have several loose tarry black bowel movements.  He reports his symptoms started about 5 weeks ago after he finished a course of antibiotics for pneumonia. There is no history of hematochezia but stools have been black and tarry. He feels his abdomen is distended which is not normal for him.He denies having any abdominal pain.  Per GI, clinical picture is complicated by the fact that he has been on iron supplements and Pepto-Bismol which can cause black stool and which have been held since admission, also noncompliant w/ celiac diet, taking magnesium, taking sertraline - all this can also cause loose BM. Cdiff not detected. GI planninf EGD/colonoscopy Monday 02/17     Consultants:  Gastroenterology  Procedures/Surgeries: none      ASSESSMENT & PLAN:   Change in bowel habits with diarrhea and black tarry stools iron deficiency anemia and has had open 1.7 gm drop in his hemoglobin since 11/26/23 suspicious for possible GIB history of collagenous colitis and celiac sprue-patient  start Budesonide 9 milligrams per day to see if this helps his diarrhea.  has not been compliant with a gluten-free diet as per his daughter --> gluten free diet reinforced  Per GI, would be prudent to do an EGD and a colonoscopy on 11/30/2023.  Holding Eliquis    GERD/history of esophageal dysmotility dysphagia with a history of esophageal stenosis [s/p dilation] and LA grade a esophagitis in 2023.  PPI  PAF  Hold Eliquis (last dose was 02/14 morning) continue flecainide     Chronic HFpEF  Hold Lasix, monitor volume status     CKD 3A  Appears close to baseline  Renally-dose medications    OSA  CPAP while sleeping     Generalized weakness/fatigue  Hydrate with IVF, monitor H&H, consult PT         DVT prophylaxis: SCD IV fluids: no continuous IV fluids  Nutrition: gluten-free full liquid Central lines / invasive devices:  none  Code Status: FULL CODE ACP documentation reviewed:  none on file in [facility]  TOC needs: TBD Barriers to dispo / significant pending items: scope on Mon

## 2023-11-28 NOTE — Plan of Care (Signed)

## 2023-11-29 DIAGNOSIS — K529 Noninfective gastroenteritis and colitis, unspecified: Secondary | ICD-10-CM

## 2023-11-29 DIAGNOSIS — I5032 Chronic diastolic (congestive) heart failure: Secondary | ICD-10-CM | POA: Diagnosis not present

## 2023-11-29 DIAGNOSIS — I10 Essential (primary) hypertension: Secondary | ICD-10-CM | POA: Diagnosis not present

## 2023-11-29 DIAGNOSIS — N1831 Chronic kidney disease, stage 3a: Secondary | ICD-10-CM | POA: Diagnosis not present

## 2023-11-29 LAB — CBC
HCT: 37.3 % — ABNORMAL LOW (ref 39.0–52.0)
Hemoglobin: 11.5 g/dL — ABNORMAL LOW (ref 13.0–17.0)
MCH: 31.4 pg (ref 26.0–34.0)
MCHC: 30.8 g/dL (ref 30.0–36.0)
MCV: 101.9 fL — ABNORMAL HIGH (ref 80.0–100.0)
Platelets: 249 10*3/uL (ref 150–400)
RBC: 3.66 MIL/uL — ABNORMAL LOW (ref 4.22–5.81)
RDW: 14.1 % (ref 11.5–15.5)
WBC: 9.1 10*3/uL (ref 4.0–10.5)
nRBC: 0 % (ref 0.0–0.2)

## 2023-11-29 LAB — BASIC METABOLIC PANEL
Anion gap: 10 (ref 5–15)
BUN: 8 mg/dL (ref 8–23)
CO2: 23 mmol/L (ref 22–32)
Calcium: 8.8 mg/dL — ABNORMAL LOW (ref 8.9–10.3)
Chloride: 105 mmol/L (ref 98–111)
Creatinine, Ser: 0.97 mg/dL (ref 0.61–1.24)
GFR, Estimated: >60 mL/min (ref >60)
Glucose, Bld: 91 mg/dL (ref 70–99)
Potassium: 3.6 mmol/L (ref 3.5–5.1)
Sodium: 138 mmol/L (ref 135–145)

## 2023-11-29 MED ORDER — PEG 3350-KCL-NABCB-NACL-NASULF 236 G PO SOLR: 4000.0000 mL | Freq: Once | ORAL | Status: DC

## 2023-11-29 MED ORDER — PEG-KCL-NACL-NASULF-NA ASC-C 100 G PO SOLR
0.5000 | Freq: Once | ORAL | Status: AC
  Administered 2023-11-29: 100 g via ORAL
  Filled 2023-11-29: qty 1

## 2023-11-29 MED ORDER — PEG-KCL-NACL-NASULF-NA ASC-C 100 G PO SOLR
0.5000 | Freq: Once | ORAL | Status: AC
  Administered 2023-11-30: 100 g via ORAL

## 2023-11-29 MED ORDER — LACTATED RINGERS IV SOLN: INTRAVENOUS | Status: AC

## 2023-11-29 NOTE — Assessment & Plan Note (Signed)
 11-29-2023 continue cardura. Stable. 11-30-2023 stable.  12-01-2023 continue cardura at home.

## 2023-11-29 NOTE — TOC Initial Note (Signed)
 Transition of Care [facility]) - Initial/Assessment Note    Patient Details  Name: [name] MRN: [id number] Date of Birth: Jan 25, 1935  Transition of Care [facility]) CM/SW Contact:    Adrian Prows, RN Phone Number: 11/29/2023, 11:59 AM  Clinical Narrative:                 Sherron Monday w/ pt and dtr Denyce Robert in room; pt says he lives at home w/ his wife; he plans to return at d/c; pt identified POCs  Coyt Govoni (spouse) [phone] / Denyce Robert (dtr) [phone]); pt verified PCP/insurance; his dtr will provide transportation; he denies SDOH risks; pt has cane, walker, wheelchair, and BSC; pt says he does not receive HH services or home oxygen; PT has recc no follow up; TOC is signing off; please place consult if needed.  Expected Discharge Plan: Home/Self Care Barriers to Discharge: Continued Medical Work up   Patient Goals and CMS Choice Patient states their goals for this hospitalization and ongoing recovery are:: home CMS Medicare.gov Compare Post Acute Care list provided to:: Patient        Expected Discharge Plan and Services   Discharge Planning Services: CM Consult   Living arrangements for the past 2 months: Single Family Home                 DME Arranged: N/A DME Agency: NA       HH Arranged: NA HH Agency: NA        Prior Living Arrangements/Services Living arrangements for the past 2 months: Single Family Home Lives with:: Spouse Patient language and need for interpreter reviewed:: Yes Do you feel safe going back to the place where you live?: Yes      Need for Family Participation in Patient Care: Yes (Comment) Care giver support system in place?: Yes (comment) Current home services: DME (cane. walker, BSC, shower chair) Criminal Activity/Legal Involvement Pertinent to Current Situation/Hospitalization: No - Comment as needed  Activities of Daily Living   ADL Screening (condition at time of admission) Independently performs ADLs?: Yes  (appropriate for developmental age) Is the patient deaf or have difficulty hearing?: Yes Does the patient have difficulty seeing, even when wearing glasses/contacts?: No (intermittment per daughteer) Does the patient have difficulty concentrating, remembering, or making decisions?: Yes  Permission Sought/Granted Permission sought to share information with : Case Manager    Share Information with NAME: Case Manager     Permission granted to share info w Relationship: Ramona Slinger (spouse) [phone] / Denyce Robert (dtr) [phone]     Emotional Assessment Appearance:: Appears stated age Attitude/Demeanor/Rapport: Gracious Affect (typically observed): Accepting Orientation: : Oriented to Self, Oriented to Place, Oriented to  Time, Oriented to Situation Alcohol / Substance Use: Not Applicable Psych Involvement: No (comment)  Admission diagnosis:  Upper GI bleed [K92.2] Diarrhea with dehydration [R19.7] Patient Active Problem List   Diagnosis Date Noted   Weakness 11/27/2023   Celiac disease 11/27/2023   Anticoagulated 11/27/2023   Dysphagia 11/27/2023   Diarrhea with dehydration 11/27/2023   Chronic heart failure with preserved ejection fraction (HFpEF) (HCC) 11/27/2023   CKD stage 3a, GFR 45-59 ml/min (HCC) 11/27/2023   Dizziness 05/26/2023   Hypokalemia 05/26/2023   Vestibular neuritis 05/26/2023   PAF (paroxysmal atrial fibrillation) (HCC) 01/21/2023   Chronic diarrhea 05/19/2019   OSA on CPAP 10/17/2018   Essential hypertension, benign 03/29/2014   BPH (benign prostatic hyperplasia) 03/29/2014   Postoperative anemia due to acute blood loss 03/29/2014   Fracture,  intertrochanteric, left femur (HCC) 03/27/2014   Hip fracture (HCC) 03/27/2014   DYSPNEA 06/05/2009   ESOPHAGEAL STRICTURE 06/04/2009   GERD 06/04/2009   Peptic ulcer 06/04/2009   DEGENERATIVE DISC DISEASE 06/04/2009   MUSCLE SPASM, BACK 06/04/2009   PCP:  Chilton Greathouse, MD Pharmacy:   [facility] [id number] [place], [place] - [address] [place] [address] Phone: [phone] Fax: [phone]     Social Drivers of Health (SDOH) Social History: SDOH Screenings   Food Insecurity: No Food Insecurity (11/29/2023)  Housing: Low Risk  (11/29/2023)  Transportation Needs: No Transportation Needs (11/29/2023)  Utilities: Not At Risk (11/29/2023)  Recent Concern: Utilities - At Risk (11/27/2023)  Social Connections: Socially Integrated (11/28/2023)  Tobacco Use: Medium Risk (11/27/2023)   SDOH Interventions: Food Insecurity Interventions: Intervention Not Indicated, Inpatient TOC Housing Interventions: Intervention Not Indicated, Inpatient TOC Transportation Interventions: Intervention Not Indicated, Inpatient TOC Utilities Interventions: Intervention Not Indicated, Inpatient TOC   Readmission Risk Interventions    11/29/2023   11:56 AM  Readmission Risk Prevention Plan  Transportation Screening Complete  PCP or Specialist Appt within 5-7 Days Complete  Home Care Screening Complete  Medication Review (RN CM) Complete

## 2023-11-29 NOTE — Assessment & Plan Note (Signed)
 11-29-2023 eliquis on hold. Continue flecainide 100 mg bid. 11-30-2023 stable.   12-01-2023 communicated with GI team. Pt ok to restart Eliquis tomorrow 12-02-2023. Continue flecainide 100 mg bid.

## 2023-11-29 NOTE — Subjective & Objective (Signed)
 Pt seen and examined. Met with pt and his dtr Synetta Fail. Completed colonoscopy prep. Awaiting EGD/colonoscopy

## 2023-11-29 NOTE — Progress Notes (Signed)
 PROGRESS NOTE    [name]  UJW:[id number] DOB: August 07, 1935 DOA: 11/27/2023 PCP: Chilton Greathouse, MD  Subjective: Pt seen and examined. Met with pt and son [name] at bedside. Pt still with diarrhea. Tolerating full liquid diet this AM. Needs colonoscopy prep today.   Hospital Course: HPI/Hospital Course: [name] is a 89 y.o. male with medical history significant for hypertension, atrial fibrillation on Eliquis, chronic HFpEF, OSA on CPAP, celiac disease, microscopic colitis, dysphagia, and C. difficile colitis who presents with 1 month of loose stools, more recent dark stool, and worsening fatigue.   Patient reports 1 month of loose stools with up to 10-12 episodes daily.  There has not been any abdominal pain, nausea, vomiting, fever, or chills associated with this.  He has noted his stool to be very dark, essentially black for the past 5 days.  He has experienced progressive generalized weakness and fatigue over the course of this illness.  He denies any chest pain, shortness of breath, or focal numbness or weakness.   ED Course: Upon arrival to the ED, patient is found to be afebrile and saturating well on room air with normal heart rate and stable blood pressure.  Labs are most notable for normal BUN, creatinine 1.25, normal WBC, hemoglobin 12.5, and normal LFTs.   GI was consulted by the ED physician and the patient was treated with Pepcid.   Per GI, clinical picture is complicated by the fact that he has been on iron supplements and Pepto-Bismol which can cause black stool and which have been held since admission, also noncompliant w/ celiac diet, taking magnesium, taking sertraline - all this can also cause loose BM. Cdiff not detected. GI planninf EGD/colonoscopy Monday 02/17  Significant Events: Admitted 11/27/2023 for diarrhea with dehydration   Significant Labs: WBC 9.7, HgB 12.5, Plt 240 Na 136, K 4.4, BUN 15, Scr 1.25, glu 89 C. Diff antigen negative, toxin  negative  Significant Imaging Studies:   Antibiotic Therapy: Anti-infectives (From admission, onward)    None       Procedures:   Consultants: GI    Assessment and Plan: * Diarrhea with dehydration 11-29-2023 dehydration resolved. GI planning on EGD/colonoscopy tomorrow. NPO after MN. Colonoscopy prep today.  Chronic diarrhea 11-29-2023 GI planning on colonoscopy tomorrow. Started on budesonide 9 mg daily. C. Diff negative.  CKD stage 3a, GFR 45-59 ml/min (HCC) - baseline Scr 1.2-1.5 11-29-2023 baseline Scr 1.2-1.5. stable.  Chronic heart failure with preserved ejection fraction (HFpEF) (HCC) 11-29-2023 euvolemic at this point. Holding lasix for now.  Dysphagia 11-29-2023 pt to have EGD tomorrow.  Anticoagulated 11-29-2023 on eliquis at home for hx of PAF. Holding eliquis for EGD/colonoscopy tomorrow.  Celiac disease Prior to 11-29-2023, start Budesonide 9 milligrams per day to see if this helps his diarrhea.  has not been compliant with a gluten-free diet as per his daughter --> gluten free diet reinforced  Per GI, would be prudent to do an EGD and a colonoscopy on 11/30/2023.  Holding Eliquis    11-29-2023 stable.  Weakness 11-29-2023 stable. Walked 200 ft with PT. PT has signed off.  PAF (paroxysmal atrial fibrillation) (HCC) 11-29-2023 eliquis on hold. Continue flecainide 100 mg bid.  OSA on CPAP 11-29-2023 stable. Prn cpap.  BPH (benign prostatic hyperplasia) 11-29-2023 continue cardura. Stable.  Essential hypertension, benign 11-29-2023 continue hydralazine prn  DVT prophylaxis: Place and maintain sequential compression device Start: 11/28/23 1436 SCDs Start: 11/27/23 1746    Code Status: Full Code Family Communication: discussed with  pt and son [name] at bedside Disposition Plan: return home Reason for continuing need for hospitalization: EGD/Colonoscopy in AM.  Objective: Vitals:   11/28/23 1927 11/29/23 0441 11/29/23 0901 11/29/23 1210  BP:  (!) 149/75 (!) 153/66 (!) 153/66 (!) 148/76  Pulse: 83 71  77  Resp: 17 17  18   Temp: 97.8 F (36.6 C) 98.1 F (36.7 C)  98.9 F (37.2 C)  TempSrc: Oral Oral  Oral  SpO2: 95% 92%  91%  Weight:      Height:        Intake/Output Summary (Last 24 hours) at 11/29/2023 1403 Last data filed at 11/29/2023 0500 Gross per 24 hour  Intake --  Output 1125 ml  Net -1125 ml   Filed Weights   11/27/23 1418  Weight: 65.8 kg   Examination:  Physical Exam Vitals and nursing note reviewed.  Constitutional:      General: He is not in acute distress.    Appearance: He is not toxic-appearing or diaphoretic.  HENT:     Head: Normocephalic and atraumatic.     Nose: Nose normal.  Eyes:     General: No scleral icterus. Cardiovascular:     Rate and Rhythm: Normal rate and regular rhythm.     Pulses: Normal pulses.     Heart sounds: Normal heart sounds.  Pulmonary:     Effort: Pulmonary effort is normal.     Breath sounds: Normal breath sounds.  Abdominal:     General: Abdomen is flat. Bowel sounds are normal. There is no distension.     Palpations: Abdomen is soft.  Musculoskeletal:     Right lower leg: No edema.     Left lower leg: No edema.  Skin:    General: Skin is warm and dry.     Capillary Refill: Capillary refill takes less than 2 seconds.  Neurological:     Mental Status: He is alert and oriented to person, place, and time.    Data Reviewed: I have personally reviewed following labs and imaging studies  CBC: Recent Labs  Lab 11/26/23 1151 11/27/23 1526 11/28/23 0439 11/29/23 0414  WBC 7.1 9.7 9.8 9.1  NEUTROABS 4.7  --   --   --   HGB 12.8* 12.5* 11.1* 11.5*  HCT 39.3 39.7 34.6* 37.3*  MCV 96.6 99.0 97.2 101.9*  PLT 233.0 240 252 249   Basic Metabolic Panel: Recent Labs  Lab 11/26/23 1151 11/27/23 1526 11/28/23 0439 11/29/23 0414  NA 136 136 136 138  K 3.8 4.4 3.8 3.6  CL 100 101 104 105  CO2 25 24 24 23   GLUCOSE 90 89 90 91  BUN 16 16 9 8   CREATININE  1.37 1.25* 1.06 0.97  CALCIUM 9.2 9.2 8.6* 8.8*  MG 2.2 2.0  --   --    GFR: Estimated Creatinine Clearance: 49.3 mL/min (by C-G formula based on SCr of 0.97 mg/dL). Liver Function Tests: Recent Labs  Lab 11/26/23 1151 11/27/23 1526  AST 16 17  ALT 10 14  ALKPHOS 57 57  BILITOT 0.3 0.5  PROT 7.5 7.3  ALBUMIN 4.2 3.7   BNP (last 3 results) Recent Labs    12/24/22 0954 01/28/23 1055 09/17/23 1534  BNP 288.4* 134.4* 59.3   Anemia Panel: Recent Labs    11/27/23 1553  VITAMINB12 701    Recent Results (from the past 240 hours)  C Difficile Quick Screen w PCR reflex     Status: None   Collection Time: 11/27/23  5:18 PM   Specimen: STOOL  Result Value Ref Range Status   C Diff antigen NEGATIVE NEGATIVE Final   C Diff toxin NEGATIVE NEGATIVE Final   C Diff interpretation No C. difficile detected.  Final    Comment: NEGATIVE Performed at [facility], [address], [place], [place] [address]    Scheduled Meds:  budesonide  9 mg Oral Daily   doxazosin  4 mg Oral Daily   flecainide  100 mg Oral BID   peg 3350 powder  0.5 kit Oral Once   [START ON 11/30/2023] peg 3350 powder  0.5 kit Oral Once   sertraline  25 mg Oral QHS   Continuous Infusions:   LOS: 2 days   Time spent: 40 minutes  Carollee Herter, DO  [facility]  11/29/2023, 2:03 PM

## 2023-11-29 NOTE — Assessment & Plan Note (Signed)
 11-29-2023 stable. Walked 200 ft with PT. PT has signed off.

## 2023-11-29 NOTE — Assessment & Plan Note (Signed)
 11-29-2023 continue hydralazine prn  11-30-2023 stable.

## 2023-11-29 NOTE — Progress Notes (Signed)
 [name] Subjective: There has not been much change in the patient's overall condition since admission.  He continues to have black tarry stools with 4 bowel movements last night and 4 bowel movement this morning. He is awaiting Eliquis washout so that an EGD and colonoscopy can be done tomorrow. He denies having abdominal pain, nausea or vomiting. There is no history of hematochezia. Stools are negative for C. difficile toxin assay.  Objective: Vital signs in last 24 hours: Temp:  [97.8 F (36.6 C)-98.1 F (36.7 C)] 98.1 F (36.7 C) (02/16 0441) Pulse Rate:  [71-83] 71 (02/16 0441) Resp:  [17] 17 (02/16 0441) BP: (149-157)/(66-78) 153/66 (02/16 0441) SpO2:  [92 %-95 %] 92 % (02/16 0441) Last BM Date : 11/27/23  Intake/Output from previous day: 02/15 0701 - 02/16 0700 In: -  Out: 1125 [Urine:1125] Intake/Output this shift: No intake/output data recorded.  General appearance: alert, cooperative, appears stated age, no distress, and pale Resp: clear to auscultation bilaterally Cardio: regular rate and rhythm, S1, S2 normal, no murmur, click, rub or gallop GI: soft, slightly distended non-tender; bowel sounds normal; no masses,  no organomegaly; a small reducible umbilical hernia is noted Extremities: extremities normal, atraumatic, no cyanosis or edema  Lab Results: Recent Labs    11/27/23 1526 11/28/23 0439 11/29/23 0414  WBC 9.7 9.8 9.1  HGB 12.5* 11.1* 11.5*  HCT 39.7 34.6* 37.3*  PLT 240 252 249   BMET Recent Labs    11/27/23 1526 11/28/23 0439 11/29/23 0414  NA 136 136 138  K 4.4 3.8 3.6  CL 101 104 105  CO2 24 24 23   GLUCOSE 89 90 91  BUN 16 9 8   CREATININE 1.25* 1.06 0.97  CALCIUM 9.2 8.6* 8.8*   LFT Recent Labs    11/27/23 1526  PROT 7.3  ALBUMIN 3.7  AST 17  ALT 14  ALKPHOS 57  BILITOT 0.5   C-Diff Recent Labs    11/27/23 1718  CDIFFTOX NEGATIVE   Medications: I have reviewed the patient's current medications. Prior to  Admission:  Medications Prior to Admission  Medication Sig Dispense Refill Last Dose/Taking   acetaminophen (TYLENOL) 500 MG tablet Take 1,000 mg by mouth in the morning and at bedtime.   11/27/2023 Morning   albuterol (PROVENTIL HFA;VENTOLIN HFA) 108 (90 Base) MCG/ACT inhaler Inhale 2 puffs into the lungs every 6 (six) hours as needed for wheezing or shortness of breath.   Taking As Needed   amLODipine (NORVASC) 5 MG tablet Take 5 mg by mouth daily.   11/26/2023   apixaban (ELIQUIS) 5 MG TABS tablet Take 1 tablet (5 mg total) by mouth 2 (two) times daily. 180 tablet 3 11/27/2023 at  7:00 AM   chlorhexidine (PERIDEX) 0.12 % solution 15 mLs by Mouth Rinse route 3 (three) times daily after meals.   11/26/2023   diclofenac Sodium (VOLTAREN) 1 % GEL Apply 2 g topically 4 (four) times daily as needed (pain.).   Taking As Needed   doxazosin (CARDURA) 8 MG tablet Take 4 mg by mouth daily.   11/27/2023 Morning   flecainide (TAMBOCOR) 50 MG tablet Take 100 mg by mouth in the morning and at bedtime.   11/27/2023 at  7:00 AM   furosemide (LASIX) 20 MG tablet Take 1 tablet by mouth as needed or directed by the Heart Failure clinic (Patient taking differently: Take 20 mg by mouth daily as needed for edema or fluid.) 15 tablet 11 Taking Differently   gabapentin (NEURONTIN) 600  MG tablet Take 600 mg by mouth at bedtime.   11/26/2023 Bedtime   loratadine (CLARITIN) 10 MG tablet Take 10 mg by mouth in the morning.   11/27/2023 Morning   magnesium oxide (MAG-OX) 400 (240 Mg) MG tablet Take 400 mg by mouth at bedtime.   11/26/2023 Bedtime   montelukast (SINGULAIR) 10 MG tablet Take 10 mg by mouth in the morning.   11/27/2023 Morning   potassium chloride SA (KLOR-CON M) 20 MEQ tablet TAKE 1 TABLET EVERY DAY WITH SUPPER (Patient taking differently: Take 20 mEq by mouth every evening.) 90 tablet 1 11/26/2023 Evening   PRESCRIPTION MEDICATION CPAP- At bedtime   11/26/2023 Bedtime   sertraline (ZOLOFT) 50 MG tablet Take 25 mg by  mouth at bedtime.   11/26/2023 Bedtime   SYSTANE HYDRATION PF 0.4-0.3 % SOLN Place 1 drop into both eyes 3 (three) times daily as needed (for dryness).   Past Week   zolpidem (AMBIEN) 10 MG tablet Take 10 mg by mouth at bedtime.   11/26/2023 Bedtime   Scheduled:  budesonide  9 mg Oral Daily   doxazosin  4 mg Oral Daily   flecainide  100 mg Oral BID   sertraline  25 mg Oral QHS   Continuous: ZOX:[id number] **OR** acetaminophen, albuterol, hydrALAZINE, prochlorperazine, zolpidem  Assessment/Plan: 1) Change in bowel habits with diarrhea and black tarry stools/iron deficiency anemia/history of collagenous colitis and celiac sprue-patient has had open 1.7 gm drop in his hemoglobin since 11/26/23-I will schedule him for EGD and colonoscopy tomorrow. Patient has not been compliant with a gluten-free diet as per his daughter and therefore he has ongoing problem with iron deficiency anemia.  It would be prudent to do an EGD and a colonoscopy on 11/30/2023. 2) GERD/history of esophageal dysmotility dysphagia with a history of esophageal stenosis [s/p dilation] and LA grade a esophagitis in 2023. 3) History of atrial fibrillation on Eliquis which is currently on hold. 4) Colonic diverticulosis on previous colonoscopy. 5) HTN. 6) OSA. 7) Chronic diastolic congestive heart failure. 8) Mitral regurgitation.  LOS: 2 days   Charna Elizabeth 11/29/2023, 8:34 AM

## 2023-11-29 NOTE — Assessment & Plan Note (Signed)
 11-29-2023 euvolemic at this point. Holding lasix for now.  11-30-2023 can resume PRN lasix at home.

## 2023-11-29 NOTE — Assessment & Plan Note (Signed)
 11-29-2023 pt to have EGD tomorrow. 11-30-2023 EGD today.  12-01-2023 seen by ST. MBS performed. Pt with functional oropharyngeal swallow ability, no aspiration or penetration and swallow was timely.

## 2023-11-29 NOTE — Assessment & Plan Note (Addendum)
 11-29-2023 baseline Scr 1.2-1.5. stable. 11-30-2023 stable.   12-01-2023 discharge Scr 0.89

## 2023-11-29 NOTE — Assessment & Plan Note (Signed)
 11-29-2023 GI planning on colonoscopy tomorrow. Started on budesonide 9 mg daily. C. Diff negative. 11-30-2023 stable. Going to EGD/colonoscopy today. Infectious workup is negative. Most likely non-compliance with gluten-free diet.  12-01-2023 colonoscopy showed pseudomembrane despite c. Diff testing negative. Budesonide was stopped. Pt placed on dificid. Outpatient pharmacy has qualified pt for drug company-sponsored patient assistance with meds. Meds will be mailed to his home. Stop PPI. F/u with GI. They will call him with appointment.

## 2023-11-29 NOTE — Assessment & Plan Note (Signed)
 Prior to 11-29-2023, start Budesonide 9 milligrams per day to see if this helps his diarrhea.  has not been compliant with a gluten-free diet as per his daughter --> gluten free diet reinforced  Per GI, would be prudent to do an EGD and a colonoscopy on 11/30/2023.  Holding Eliquis   11-29-2023 stable. 11-30-2023 stable. Reinforce to pt and dtr anita that pt needs to stay on gluten-free diet.  12-01-2023 pt to remains on gluten-free diet.

## 2023-11-29 NOTE — Assessment & Plan Note (Signed)
 11-29-2023 stable. Prn cpap. 11-30-2023 stable.

## 2023-11-29 NOTE — Plan of Care (Signed)
   Problem: Education: Goal: Knowledge of General Education information will improve Description Including pain rating scale, medication(s)/side effects and non-pharmacologic comfort measures Outcome: Progressing   Problem: Health Behavior/Discharge Planning: Goal: Ability to manage health-related needs will improve Outcome: Progressing

## 2023-11-29 NOTE — Assessment & Plan Note (Signed)
 11-29-2023 on eliquis at home for hx of PAF. Holding eliquis for EGD/colonoscopy tomorrow. 11-30-2023 holding eliquis for egd/colonoscopy today.  12-01-2023 communicated with GI team. Pt ok to restart Eliquis tomorrow 12-02-2023.

## 2023-11-29 NOTE — Assessment & Plan Note (Signed)
 11-29-2023 dehydration resolved. GI planning on EGD/colonoscopy tomorrow. NPO after MN. Colonoscopy prep today. 11-30-2023 resolved.

## 2023-11-30 ENCOUNTER — Encounter (HOSPITAL_COMMUNITY)
Admission: EM | Disposition: A | Discharge status: Home / Self Care | Payer: Self-pay | Source: Home / Self Care | Attending: Internal Medicine

## 2023-11-30 ENCOUNTER — Inpatient Hospital Stay (HOSPITAL_COMMUNITY): Payer: Medicare HMO

## 2023-11-30 ENCOUNTER — Inpatient Hospital Stay (HOSPITAL_COMMUNITY): Payer: Medicare HMO | Admitting: Anesthesiology

## 2023-11-30 DIAGNOSIS — A0472 Enterocolitis due to Clostridium difficile, not specified as recurrent: Secondary | ICD-10-CM | POA: Diagnosis not present

## 2023-11-30 DIAGNOSIS — R1310 Dysphagia, unspecified: Secondary | ICD-10-CM | POA: Diagnosis not present

## 2023-11-30 DIAGNOSIS — Z7901 Long term (current) use of anticoagulants: Secondary | ICD-10-CM

## 2023-11-30 DIAGNOSIS — K449 Diaphragmatic hernia without obstruction or gangrene: Secondary | ICD-10-CM

## 2023-11-30 DIAGNOSIS — K317 Polyp of stomach and duodenum: Secondary | ICD-10-CM

## 2023-11-30 DIAGNOSIS — K222 Esophageal obstruction: Secondary | ICD-10-CM

## 2023-11-30 DIAGNOSIS — K640 First degree hemorrhoids: Secondary | ICD-10-CM

## 2023-11-30 DIAGNOSIS — E876 Hypokalemia: Secondary | ICD-10-CM

## 2023-11-30 DIAGNOSIS — I480 Paroxysmal atrial fibrillation: Secondary | ICD-10-CM | POA: Diagnosis not present

## 2023-11-30 DIAGNOSIS — K529 Noninfective gastroenteritis and colitis, unspecified: Secondary | ICD-10-CM | POA: Diagnosis not present

## 2023-11-30 DIAGNOSIS — R197 Diarrhea, unspecified: Secondary | ICD-10-CM | POA: Diagnosis not present

## 2023-11-30 DIAGNOSIS — I5032 Chronic diastolic (congestive) heart failure: Secondary | ICD-10-CM | POA: Diagnosis not present

## 2023-11-30 DIAGNOSIS — K2950 Unspecified chronic gastritis without bleeding: Secondary | ICD-10-CM

## 2023-11-30 HISTORY — PX: BIOPSY: SHX5522

## 2023-11-30 HISTORY — PX: ESOPHAGOGASTRODUODENOSCOPY (EGD) WITH PROPOFOL: SHX5813

## 2023-11-30 HISTORY — PX: FLEXIBLE SIGMOIDOSCOPY: SHX5431

## 2023-11-30 HISTORY — PX: BALLOON DILATION: SHX5330

## 2023-11-30 LAB — CBC
HCT: 40.3 % (ref 39.0–52.0)
Hemoglobin: 12.8 g/dL — ABNORMAL LOW (ref 13.0–17.0)
MCH: 30.8 pg (ref 26.0–34.0)
MCHC: 31.8 g/dL (ref 30.0–36.0)
MCV: 96.9 fL (ref 80.0–100.0)
Platelets: 309 10*3/uL (ref 150–400)
RBC: 4.16 MIL/uL — ABNORMAL LOW (ref 4.22–5.81)
RDW: 14.2 % (ref 11.5–15.5)
WBC: 12.6 10*3/uL — ABNORMAL HIGH (ref 4.0–10.5)
nRBC: 0 % (ref 0.0–0.2)

## 2023-11-30 LAB — BASIC METABOLIC PANEL
Anion gap: 13 (ref 5–15)
BUN: 10 mg/dL (ref 8–23)
CO2: 23 mmol/L (ref 22–32)
Calcium: 9.3 mg/dL (ref 8.9–10.3)
Chloride: 103 mmol/L (ref 98–111)
Creatinine, Ser: 1.19 mg/dL (ref 0.61–1.24)
GFR, Estimated: 59 mL/min — ABNORMAL LOW (ref >60)
Glucose, Bld: 116 mg/dL — ABNORMAL HIGH (ref 70–99)
Potassium: 3 mmol/L — ABNORMAL LOW (ref 3.5–5.1)
Sodium: 139 mmol/L (ref 135–145)

## 2023-11-30 LAB — TISSUE TRANSGLUTAMINASE, IGA: Tissue Transglutaminase Ab, IgA: 3 U/mL (ref 0–3)

## 2023-11-30 LAB — GLIADIN ANTIBODIES, SERUM
Antigliadin Abs, IgA: 18 U (ref 0–19)
Gliadin IgG: 15 U (ref 0–19)

## 2023-11-30 SURGERY — SIGMOIDOSCOPY, FLEXIBLE
Anesthesia: Monitor Anesthesia Care

## 2023-11-30 MED ORDER — ONDANSETRON HCL 4 MG/2ML IJ SOLN
INTRAMUSCULAR | Status: DC | PRN
  Administered 2023-11-30: 4 mg via INTRAVENOUS

## 2023-11-30 MED ORDER — POTASSIUM CHLORIDE 10 MEQ/100ML IV SOLN
10.0000 meq | INTRAVENOUS | Status: AC
  Administered 2023-11-30 (×4): 10 meq via INTRAVENOUS
  Filled 2023-11-30 (×4): qty 100

## 2023-11-30 MED ORDER — LIDOCAINE HCL (CARDIAC) PF 100 MG/5ML IV SOSY
PREFILLED_SYRINGE | INTRAVENOUS | Status: DC | PRN
  Administered 2023-11-30: 100 mg via INTRAVENOUS

## 2023-11-30 MED ORDER — POTASSIUM CHLORIDE 10 MEQ/100ML IV SOLN: 10.0000 meq | INTRAVENOUS | Status: DC

## 2023-11-30 MED ORDER — PROPOFOL 1000 MG/100ML IV EMUL
INTRAVENOUS | Status: AC
  Filled 2023-11-30: qty 100

## 2023-11-30 MED ORDER — FIDAXOMICIN 200 MG PO TABS
200.0000 mg | ORAL_TABLET | Freq: Two times a day (BID) | ORAL | Status: DC
  Administered 2023-11-30 – 2023-12-01 (×2): 200 mg via ORAL
  Filled 2023-11-30 (×3): qty 1

## 2023-11-30 MED ORDER — SODIUM CHLORIDE 0.9 % IV SOLN: INTRAVENOUS | Status: DC | PRN

## 2023-11-30 MED ORDER — PROPOFOL 500 MG/50ML IV EMUL
INTRAVENOUS | Status: DC | PRN
  Administered 2023-11-30: 20 ug via INTRAVENOUS
  Administered 2023-11-30: 180 ug/kg/min via INTRAVENOUS

## 2023-11-30 SURGICAL SUPPLY — 14 items

## 2023-11-30 NOTE — Progress Notes (Signed)
   11/30/23 2028  BiPAP/CPAP/SIPAP  Reason BIPAP/CPAP not in use Non-compliant   Pt refused cpap tonight.  Pt encouraged to call should he change his mind.

## 2023-11-30 NOTE — Telephone Encounter (Signed)
 Called patient to discuss Eliquis clearance spoke to patient daughter patient is in the hospital waiting to have procedure done.cancelled patient [facility] procedure.

## 2023-11-30 NOTE — Transfer of Care (Signed)
 Immediate Anesthesia Transfer of Care Note  Patient: [name]  Procedure(s) Performed: FLEXIBLE SIGMOIDOSCOPY ESOPHAGOGASTRODUODENOSCOPY (EGD) WITH PROPOFOL BIOPSY BALLOON DILATION  Patient Location: PACU  Anesthesia Type:MAC  Level of Consciousness: drowsy and patient cooperative  Airway & Oxygen Therapy: Patient Spontanous Breathing and Patient connected to face mask oxygen  Post-op Assessment: Report given to RN and Post -op Vital signs reviewed and stable  Post vital signs: Reviewed and stable  Last Vitals:  Vitals Value Taken Time  BP 136/49 11/30/23 1420  Temp    Pulse 80 11/30/23 1422  Resp 19 11/30/23 1422  SpO2 100 % 11/30/23 1422  Vitals shown include unfiled device data.  Last Pain:  Vitals:   11/30/23 1229  TempSrc: Tympanic  PainSc: 0-No pain         Complications: No notable events documented.

## 2023-11-30 NOTE — Anesthesia Postprocedure Evaluation (Signed)
 Anesthesia Post Note  Patient: PABLO STAUFFER  Procedure(s) Performed: FLEXIBLE SIGMOIDOSCOPY ESOPHAGOGASTRODUODENOSCOPY (EGD) WITH PROPOFOL BIOPSY BALLOON DILATION     Patient location during evaluation: PACU Anesthesia Type: MAC Level of consciousness: awake and alert Pain management: pain level controlled Vital Signs Assessment: post-procedure vital signs reviewed and stable Respiratory status: spontaneous breathing, nonlabored ventilation, respiratory function stable and patient connected to nasal cannula oxygen Cardiovascular status: stable and blood pressure returned to baseline Postop Assessment: no apparent nausea or vomiting Anesthetic complications: no  No notable events documented.  Last Vitals:  Vitals:   11/30/23 1430 11/30/23 1440  BP: 117/76 (!) 161/68  Pulse: 78 80  Resp: (!) 21 (!) 23  Temp:    SpO2: 95% 94%    Last Pain:  Vitals:   11/30/23 1430  TempSrc:   PainSc: 0-No pain                 Shelton Silvas

## 2023-11-30 NOTE — Plan of Care (Signed)

## 2023-11-30 NOTE — Progress Notes (Addendum)
 Patient ID: [name], male   DOB: [birth year], 89 y.o.   MRN: [id number]    Progress Note   Subjective  Day # 3 CC; Persistent diarrhea in pt with celiac celiac disease and history of lymphocytic colitis  C. difficile quick screen negative TTG/IgA pending  WBC 12.6/hemoglobin 12.8/hematocrit 40.3 Potassium 3.0-getting 4 rounds this morning  Patient says that he drank almost all of the prep, he did have a couple of episodes of vomiting last night, he has had multiple bowel movements and says stool is clear Daughter at bedside, abdomen is protuberant and a little distended, patient says that it has been that way for a few weeks, not tender    Objective   Vital signs in last 24 hours: Temp:  [98.1 F (36.7 C)-98.9 F (37.2 C)] 98.4 F (36.9 C) (02/17 0527) Pulse Rate:  [77-89] 89 (02/17 0527) Resp:  [17-18] 17 (02/17 0527) BP: (146-153)/(66-78) 146/78 (02/17 0527) SpO2:  [91 %-93 %] 93 % (02/17 0527) Last BM Date : 11/29/23 General:    Elderly white male in NAD Heart:  Regular rate and rhythm; no murmurs Lungs: Respirations even and unlabored, lungs CTA bilaterally Abdomen:  Soft, protuberant, some distention nontense bowel sounds somewhat hyperactive.  Extremities:  Without edema. Neurologic:  Alert and oriented,  grossly normal neurologically. Psych:  Cooperative. Normal mood and affect.  Intake/Output from previous day: 02/16 0701 - 02/17 0700 In: 431.7 [I.V.:431.7] Out: -  Intake/Output this shift: No intake/output data recorded.  Lab Results: Recent Labs    11/28/23 0439 11/29/23 0414 11/30/23 0434  WBC 9.8 9.1 12.6*  HGB 11.1* 11.5* 12.8*  HCT 34.6* 37.3* 40.3  PLT 252 249 309   BMET Recent Labs    11/28/23 0439 11/29/23 0414 11/30/23 0434  NA 136 138 139  K 3.8 3.6 3.0*  CL 104 105 103  CO2 24 23 23   GLUCOSE 90 91 116*  BUN 9 8 10   CREATININE 1.06 0.97 1.19  CALCIUM 8.6* 8.8* 9.3   LFT Recent Labs    11/27/23 1526  PROT 7.3  ALBUMIN  3.7  AST 17  ALT 14  ALKPHOS 57  BILITOT 0.5   PT/INR No results for input(s): "LABPROT", "INR" in the last 72 hours.    Assessment / Plan:     #77 89 year old white male admitted with severe diarrhea over the past month, in setting of known celiac disease, prior diagnosis of collagenous colitis, and prior history of C. Difficile  C. difficile quick screen negative, TTG IgA pending  Plan is for EGD and colonoscopy this afternoon to reassess for active celiac disease and/or collagenous colitis.  Recent abdominal distention-no imaging since admission  #2 hypokalemia secondary to above, correcting receiving 4 runs of potassium this morning  #3 atrial fibrillation-on Eliquis-on hold since admit #4 chronic GERD #5 history of recurrent pneumonia x 2 since May 2024-rule out possible aspiration #6 sleep apnea #7 chronic kidney disease #8 congestive heart failure with preserved EF  Plan; EGD and colonoscopy this afternoon with Dr. [name] No other obvious etiologies at endoscopic evaluation may start empiric trial of budesonide until biopsies return  Will obtain abdominal films this a.m.  Will ask speech path to see patient for swallowing eval given some concern for aspiration causing his pneumonias earlier this year.  Continue to replete potassium   Principal Problem:   Diarrhea with dehydration Active Problems:   Essential hypertension, benign   BPH (benign prostatic hyperplasia)   OSA on CPAP  Chronic diarrhea   PAF (paroxysmal atrial fibrillation) (HCC)   Weakness   Celiac disease   Anticoagulated   Dysphagia   Chronic heart failure with preserved ejection fraction (HFpEF) (HCC)   CKD stage 3a, GFR 45-59 ml/min (HCC) - baseline Scr 1.2-1.5     LOS: 3 days   Amy Esterwood PA-C 11/30/2023, 8:33 AM     Attending physician's note   I have taken history, reviewed the chart and examined the patient. I performed a substantive portion of this encounter, including  complete performance of at least one of the key components, in conjunction with the APP. I agree with the Advanced Practitioner's note, impression and recommendations.    got signout from Dr. Loreta Ave  for EGD/colonoscopy today  discussed procedure, risks and benefits.   Edman Circle, MD [facility] [phone]

## 2023-11-30 NOTE — Progress Notes (Signed)
 SLP Cancellation Note  Patient Details Name: [name] MRN: [id number] DOB: 1935-03-20   Cancelled treatment:       Reason Eval/Treat Not Completed: Patient at procedure or test/unavailable. GI has placed order for MBS with SLP. Patient is NPO for EGD procedure. SLP will plan to f/u next date to complete MBS.   Angela Nevin, MA, CCC-SLP Speech Therapy

## 2023-11-30 NOTE — Op Note (Signed)
 [facility] Patient Name: [name] Procedure Date: 11/30/2023 MRN: [id number] Attending MD: Lynann Bologna , MD, [id number] Date of Birth: [birth year] CSN: [id number] Age: 89 Admit Type: Inpatient Procedure:                Upper GI endoscopy Indications:              Dysphagia. H/O esophageal stricture s/p previous                            dilatations. History of celiac disease. Providers:                Lynann Bologna, MD, Rogue Jury, RN, Rozetta Nunnery, Technician Referring MD:              Medicines:                Monitored Anesthesia Care Complications:            No immediate complications. Estimated Blood Loss:     Estimated blood loss: none. Procedure:                Pre-Anesthesia Assessment:                           - Prior to the procedure, a History and Physical                            was performed, and patient medications and                            allergies were reviewed. The patient's tolerance of                            previous anesthesia was also reviewed. The risks                            and benefits of the procedure and the sedation                            options and risks were discussed with the patient.                            All questions were answered, and informed consent                            was obtained. Prior Anticoagulants: The patient has                            taken Eliquis (apixaban), last dose was 3 days                            prior to procedure. ASA Grade Assessment: III - A  patient with severe systemic disease. After                            reviewing the risks and benefits, the patient was                            deemed in satisfactory condition to undergo the                            procedure.                           After obtaining informed consent, the endoscope was                            passed under direct vision.  Throughout the                            procedure, the patient's blood pressure, pulse, and                            oxygen saturations were monitored continuously. The                            [id number]-[id number] ([id number]) Olympus endoscope was introduced                            through the mouth, and advanced to the second part                            of duodenum. The upper GI endoscopy was                            accomplished without difficulty. The patient                            tolerated the procedure well. Scope In: Scope Out: Findings:      One benign-appearing, intrinsic mild stenosis was found 38 cm from the       incisors at GE junction. This stenosis measured 1.2 cm (inner diameter)       x less than one cm (in length). The stenosis was traversed. A TTS       dilator was passed through the scope. Dilation with a 15-16.5-18 mm       balloon dilator was performed to 18 mm. The dilation site was examined       and showed moderate mucosal disruption and moderate improvement in       luminal narrowing. Multiple biopsies were obtained from mid and distal       esophagus.      A 1 cm hiatal hernia was present.      The entire examined stomach was normal. Few small benign appearing       polyps were noted in the body of the stomach. Biopsies were taken with a       cold forceps for histology from antrum and body of the stomach.  The examined duodenum was normal. Biopsies for histology were taken with       a cold forceps for evaluation of celiac disease (2 from the bulb, 4 from       the second portion of the duodenum). Impression:               - Benign-appearing esophageal stenosis. Dilated.                           - 1 cm hiatal hernia.                           - Normal stomach. Biopsied.                           - Normal examined duodenum. Biopsied. Moderate Sedation:      Not Applicable - Patient had care per Anesthesia. Recommendation:           - Patient has a  contact number available for                            emergencies. The signs and symptoms of potential                            delayed complications were discussed with the                            patient. Return to normal activities tomorrow.                            Written discharge instructions were provided to the                            patient.                           - Resume previous diet.                           - Continue present medications.                           - Await pathology results.                           - Proceed with colonoscopy                           - The findings and recommendations were discussed                            with the patient's family. Procedure Code(s):        --- Professional ---                           [phone], Esophagogastroduodenoscopy, flexible,  transoral; with transendoscopic balloon dilation of                            esophagus (less than 30 mm diameter)                           43239, 59, Esophagogastroduodenoscopy, flexible,                            transoral; with biopsy, single or multiple Diagnosis Code(s):        --- Professional ---                           K22.2, Esophageal obstruction                           K44.9, Diaphragmatic hernia without obstruction or                            gangrene                           R13.10, Dysphagia, unspecified CPT copyright 2022 American [employer] Association. All rights reserved. The codes documented in this report are preliminary and upon coder review may  be revised to meet current compliance requirements. Lynann Bologna, MD 11/30/2023 2:19:55 PM This report has been signed electronically. Number of Addenda: 0

## 2023-11-30 NOTE — Progress Notes (Signed)
   11/30/23 2028  BiPAP/CPAP/SIPAP  Reason BIPAP/CPAP not in use Non-compliant   Pt refused to wear cpap tonight.  Pt encouraged to call should he change his mind.

## 2023-11-30 NOTE — Anesthesia Preprocedure Evaluation (Addendum)
 Anesthesia Evaluation  Patient identified by MRN, date of birth, ID band Patient awake    Reviewed: Allergy & Precautions, NPO status , Patient's Chart, lab work & pertinent test results  Airway Mallampati: I       Dental  (+) Partial Upper, Lower Dentures   Pulmonary shortness of breath and with exertion, sleep apnea and Continuous Positive Airway Pressure Ventilation , former smoker   breath sounds clear to auscultation       Cardiovascular hypertension, Pt. on medications + dysrhythmias Atrial Fibrillation  Rhythm:Regular Rate:Normal  EKG 09/17/23 NSR, 1st deg AV Block, incomplete LBBB pattern  Echo 02/17/23 1. Left ventricular ejection fraction, by estimation, is 55 to 60%. The  left ventricle has normal function. The left ventricle has no regional  wall motion abnormalities. Left ventricular diastolic parameters are  consistent with Grade I diastolic  dysfunction (impaired relaxation).   2. Right ventricular systolic function is normal. The right ventricular  size is mildly enlarged. There is mildly elevated pulmonary artery  systolic pressure. The estimated right ventricular systolic pressure is  37.6 mmHg.   3. Left atrial size was moderately dilated.   4. Right atrial size was moderately dilated.   5. The mitral valve is normal in structure. Mild to moderate mitral valve  regurgitation. No evidence of mitral stenosis.   6. The aortic valve is tricuspid. There is mild calcification of the  aortic valve. There is mild thickening of the aortic valve. Aortic valve  regurgitation is not visualized. Aortic valve sclerosis is present, with  no evidence of aortic valve stenosis.   7. The inferior vena cava is dilated in size with >50% respiratory  variability, suggesting right atrial pressure of 8 mmHg.   Cath 03/12/17 1. Normal hemodynamics 2. EF 60% 3. Normal coronary arteries    Neuro/Psych Hx/o vestibular neuritis   Neuromuscular disease  negative psych ROS   GI/Hepatic Neg liver ROS, hiatal hernia,GERD  Medicated,,Celiac disease IBS Diverticulosis   Schatzki's ring      Hx/o esophageal stenosis    Endo/Other    Renal/GU Renal InsufficiencyRenal disease   BPH    Musculoskeletal  (+) Arthritis , Osteoarthritis,    Abdominal   Peds  Hematology  (+) Blood dyscrasia, anemia   Anesthesia Other Findings   Reproductive/Obstetrics                             Anesthesia Physical Anesthesia Plan  ASA: 3  Anesthesia Plan: MAC   Post-op Pain Management: Minimal or no pain anticipated   Induction: Intravenous  PONV Risk Score and Plan: 2 and Treatment may vary due to age or medical condition and Propofol infusion  Airway Management Planned: Natural Airway and Simple Face Mask  Additional Equipment: None  Intra-op Plan:   Post-operative Plan:   Informed Consent: I have reviewed the patients History and Physical, chart, labs and discussed the procedure including the risks, benefits and alternatives for the proposed anesthesia with the patient or authorized representative who has indicated his/her understanding and acceptance.       Plan Discussed with: CRNA  Anesthesia Plan Comments:         Anesthesia Quick Evaluation

## 2023-11-30 NOTE — Progress Notes (Signed)
 Patient refused use of CPAP tonight due to dental pain.

## 2023-11-30 NOTE — Op Note (Addendum)
 [facility] Patient Name: [name] Procedure Date: 11/30/2023 MRN: [id number] Attending MD: Lynann Bologna , MD, [id number] Date of Birth: 06/06/1935 CSN: [id number] Age: 89 Admit Type: Inpatient Procedure:                Colonoscopy changed to flexible sigmoidoscopy (d/t                            pseudomembranous colitis) Indications:              Clinically significant diarrhea of unexplained                            origin. Prev colon 06/2019 neg except for                            lymphocytic colitis on Bx. recent C. difficile                            colitis treated with vancomycin. Providers:                Lynann Bologna, MD, Rogue Jury, RN, Rozetta Nunnery, Technician Referring MD:              Medicines:                Monitored Anesthesia Care Complications:            No immediate complications. Estimated Blood Loss:     Estimated blood loss: none. Estimated blood loss:                            none. Procedure:                Pre-Anesthesia Assessment:                           - Prior to the procedure, a History and Physical                            was performed, and patient medications and                            allergies were reviewed. The patient's tolerance of                            previous anesthesia was also reviewed. The risks                            and benefits of the procedure and the sedation                            options and risks were discussed with the patient.                            All questions were answered, and informed  consent                            was obtained. Prior Anticoagulants: The patient has                            taken Eliquis (apixaban), last dose was 3 days                            prior to procedure. ASA Grade Assessment: III - A                            patient with severe systemic disease. After                            reviewing the risks and  benefits, the patient was                            deemed in satisfactory condition to undergo the                            procedure.                           After obtaining informed consent, the colonoscope                            was passed under direct vision. Throughout the                            procedure, the patient's blood pressure, pulse, and                            oxygen saturations were monitored continuously. The                            [id number]-HQ190L ([id number]) Olympus colonoscope was                            introduced through the anus and advanced to the the                            sigmoid colon for evaluation. The FS was performed                            without difficulty. The patient tolerated the                            procedure well. The quality of the bowel                            preparation was fair. Scope In: 2:05:18 PM Scope Out: 2:12:44 PM Total Procedure Duration: 0 hours 7 minutes 26 seconds  Findings:      A diffuse pseudomembrane was found in  the recto-sigmoid colon and in the       sigmoid colon. Biopsies were taken with a cold forceps for histology.      Non-bleeding internal hemorrhoids were found during retroflexion. The       hemorrhoids were small and Grade I (internal hemorrhoids that do not       prolapse). Impression:               - Pseudomembranous colitis.                           - Non-bleeding internal hemorrhoids. Moderate Sedation:      Not Applicable - Patient had care per Anesthesia. Recommendation:           - Resume previous diet.                           - Start Dificid 200 mg p.o. twice daily x 10 days                           - Contact isolation for now                           - Await pathology results.                           - Consider repeat colonoscopy as outpatient if not                            better.                           - Stop budesonide, magnesium, Zoloft, PPIs.                            - The findings and recommendations were discussed                            with the patient's family. Procedure Code(s):        --- Professional ---                           [id number], 52, Colonoscopy, flexible; with biopsy,                            single or multiple Diagnosis Code(s):        --- Professional ---                           K64.0, First degree hemorrhoids                           A04.72, Enterocolitis due to Clostridium difficile,                            not specified as recurrent  R19.7, Diarrhea, unspecified CPT copyright 2022 American Medical Association. All rights reserved. The codes documented in this report are preliminary and upon coder review may  be revised to meet current compliance requirements. Lynann Bologna, MD 11/30/2023 2:32:49 PM This report has been signed electronically. Number of Addenda: 0

## 2023-11-30 NOTE — Progress Notes (Signed)
 PROGRESS NOTE    [name]  OVF:[id number] DOB: [birth year] DOA: 11/27/2023 PCP: Chilton Greathouse, MD  Subjective: Pt seen and examined. Met with pt and his dtr Synetta Fail. Completed colonoscopy prep. Awaiting EGD/colonoscopy    Hospital Course: HPI/Hospital Course: [name] is a 89 y.o. male with medical history significant for hypertension, atrial fibrillation on Eliquis, chronic HFpEF, OSA on CPAP, celiac disease, microscopic colitis, dysphagia, and C. difficile colitis who presents with 1 month of loose stools, more recent dark stool, and worsening fatigue.   Patient reports 1 month of loose stools with up to 10-12 episodes daily.  There has not been any abdominal pain, nausea, vomiting, fever, or chills associated with this.  He has noted his stool to be very dark, essentially black for the past 5 days.  He has experienced progressive generalized weakness and fatigue over the course of this illness.  He denies any chest pain, shortness of breath, or focal numbness or weakness.   ED Course: Upon arrival to the ED, patient is found to be afebrile and saturating well on room air with normal heart rate and stable blood pressure.  Labs are most notable for normal BUN, creatinine 1.25, normal WBC, hemoglobin 12.5, and normal LFTs.   GI was consulted by the ED physician and the patient was treated with Pepcid.   Per GI, clinical picture is complicated by the fact that he has been on iron supplements and Pepto-Bismol which can cause black stool and which have been held since admission, also noncompliant w/ celiac diet, taking magnesium, taking sertraline - all this can also cause loose BM. Cdiff not detected. GI planninf EGD/colonoscopy Monday 02/17  Significant Events: Admitted 11/27/2023 for diarrhea with dehydration   Significant Labs: WBC 9.7, HgB 12.5, Plt 240 Na 136, K 4.4, BUN 15, Scr 1.25, glu 89 C. Diff antigen negative, toxin negative  Significant Imaging  Studies:   Antibiotic Therapy: Anti-infectives (From admission, onward)    None       Procedures:   Consultants: GI    Assessment and Plan: * Diarrhea with dehydration 11-29-2023 dehydration resolved. GI planning on EGD/colonoscopy tomorrow. NPO after MN. Colonoscopy prep today. 11-30-2023 resolved.  Chronic diarrhea 11-29-2023 GI planning on colonoscopy tomorrow. Started on budesonide 9 mg daily. C. Diff negative.  11-30-2023 stable. Going to EGD/colonoscopy today. Infectious workup is negative. Most likely non-compliance with gluten-free diet.  CKD stage 3a, GFR 45-59 ml/min (HCC) - baseline Scr 1.2-1.5 11-29-2023 baseline Scr 1.2-1.5. stable. 11-30-2023 stable.   Chronic heart failure with preserved ejection fraction (HFpEF) (HCC) 11-29-2023 euvolemic at this point. Holding lasix for now.  Dysphagia 11-29-2023 pt to have EGD tomorrow. 11-30-2023 EGD today.  Anticoagulated 11-29-2023 on eliquis at home for hx of PAF. Holding eliquis for EGD/colonoscopy tomorrow. 11-30-2023 holding eliquis for egd/colonoscopy today.  Celiac disease Prior to 11-29-2023, start Budesonide 9 milligrams per day to see if this helps his diarrhea.  has not been compliant with a gluten-free diet as per his daughter --> gluten free diet reinforced  Per GI, would be prudent to do an EGD and a colonoscopy on 11/30/2023.  Holding Eliquis   11-29-2023 stable. 11-30-2023 stable. Reinforce to pt and dtr anita that pt needs to stay on gluten-free diet.  Weakness 11-29-2023 stable. Walked 200 ft with PT. PT has signed off.  PAF (paroxysmal atrial fibrillation) (HCC) 11-29-2023 eliquis on hold. Continue flecainide 100 mg bid. 11-30-2023 stable.   OSA on CPAP 11-29-2023 stable. Prn cpap. 11-30-2023 stable.  BPH (benign prostatic hyperplasia) 11-29-2023 continue cardura. Stable. 11-30-2023 stable.   Essential hypertension, benign 11-29-2023 continue hydralazine prn  11-30-2023  stable.       DVT prophylaxis: Place and maintain sequential compression device Start: 11/28/23 1436 SCDs Start: 11/27/23 1746    Code Status: Full Code Family Communication: discussed with pt and dtr at bedside. Dtr does not want him to go to SNF. Disposition Plan: return home Reason for continuing need for hospitalization: going to EGD/colonoscopy today.  Objective: Vitals:   11/29/23 1210 11/29/23 1949 11/30/23 0527 11/30/23 0858  BP: (!) 148/76 (!) 148/74 (!) 146/78 (!) 157/80  Pulse: 77 89 89 86  Resp: 18  17 17   Temp: 98.9 F (37.2 C) 98.1 F (36.7 C) 98.4 F (36.9 C) 98.7 F (37.1 C)  TempSrc: Oral  Oral Oral  SpO2: 91% 91% 93% 93%  Weight:      Height:        Intake/Output Summary (Last 24 hours) at 11/30/2023 1135 Last data filed at 11/30/2023 0444 Gross per 24 hour  Intake 431.71 ml  Output --  Net 431.71 ml   Filed Weights   11/27/23 1418  Weight: 65.8 kg    Examination:  Physical Exam Vitals and nursing note reviewed.  Constitutional:      General: He is not in acute distress.    Appearance: He is not toxic-appearing.  HENT:     Head: Normocephalic and atraumatic.  Eyes:     General: No scleral icterus. Pulmonary:     Effort: No respiratory distress.  Abdominal:     General: There is no distension.  Neurological:     General: No focal deficit present.     Mental Status: He is alert and oriented to person, place, and time.     Data Reviewed: I have personally reviewed following labs and imaging studies  CBC: Recent Labs  Lab 11/26/23 1151 11/27/23 1526 11/28/23 0439 11/29/23 0414 11/30/23 0434  WBC 7.1 9.7 9.8 9.1 12.6*  NEUTROABS 4.7  --   --   --   --   HGB 12.8* 12.5* 11.1* 11.5* 12.8*  HCT 39.3 39.7 34.6* 37.3* 40.3  MCV 96.6 99.0 97.2 101.9* 96.9  PLT 233.0 240 252 249 309   Basic Metabolic Panel: Recent Labs  Lab 11/26/23 1151 11/27/23 1526 11/28/23 0439 11/29/23 0414 11/30/23 0434  NA 136 136 136 138 139  K 3.8  4.4 3.8 3.6 3.0*  CL 100 101 104 105 103  CO2 25 24 24 23 23   GLUCOSE 90 89 90 91 116*  BUN 16 16 9 8 10   CREATININE 1.37 1.25* 1.06 0.97 1.19  CALCIUM 9.2 9.2 8.6* 8.8* 9.3  MG 2.2 2.0  --   --   --    GFR: Estimated Creatinine Clearance: 40.2 mL/min (by C-G formula based on SCr of 1.19 mg/dL). Liver Function Tests: Recent Labs  Lab 11/26/23 1151 11/27/23 1526  AST 16 17  ALT 10 14  ALKPHOS 57 57  BILITOT 0.3 0.5  PROT 7.5 7.3  ALBUMIN 4.2 3.7   BNP (last 3 results) Recent Labs    12/24/22 0954 01/28/23 1055 09/17/23 1534  BNP 288.4* 134.4* 59.3   Anemia Panel: Recent Labs    11/27/23 1553  VITAMINB12 701    Recent Results (from the past 240 hours)  C Difficile Quick Screen w PCR reflex     Status: None   Collection Time: 11/27/23  5:18 PM   Specimen: STOOL  Result Value Ref Range Status   C Diff antigen NEGATIVE NEGATIVE Final   C Diff toxin NEGATIVE NEGATIVE Final   C Diff interpretation No C. difficile detected.  Final    Comment: NEGATIVE Performed at [facility], [address] [place], [place] [address]      Radiology Studies: No results found.  Scheduled Meds:  budesonide  9 mg Oral Daily   doxazosin  4 mg Oral Daily   flecainide  100 mg Oral BID   sertraline  25 mg Oral QHS   Continuous Infusions:  lactated ringers 50 mL/hr at 11/30/23 0444   potassium chloride 10 mEq (11/30/23 1200)     LOS: 3 days   Time spent: 40 minutes  Carollee Herter, DO  [facility]  11/30/2023, 11:35 AM

## 2023-12-01 ENCOUNTER — Telehealth: Payer: Self-pay | Admitting: Gastroenterology

## 2023-12-01 ENCOUNTER — Other Ambulatory Visit (HOSPITAL_COMMUNITY): Payer: Self-pay

## 2023-12-01 ENCOUNTER — Encounter (HOSPITAL_COMMUNITY): Payer: Self-pay | Admitting: Gastroenterology

## 2023-12-01 ENCOUNTER — Telehealth (HOSPITAL_COMMUNITY): Payer: Self-pay | Admitting: Pharmacy Technician

## 2023-12-01 ENCOUNTER — Inpatient Hospital Stay (HOSPITAL_COMMUNITY): Payer: Medicare HMO

## 2023-12-01 DIAGNOSIS — I5032 Chronic diastolic (congestive) heart failure: Secondary | ICD-10-CM | POA: Diagnosis not present

## 2023-12-01 DIAGNOSIS — E876 Hypokalemia: Secondary | ICD-10-CM | POA: Diagnosis not present

## 2023-12-01 DIAGNOSIS — K900 Celiac disease: Secondary | ICD-10-CM

## 2023-12-01 DIAGNOSIS — K529 Noninfective gastroenteritis and colitis, unspecified: Secondary | ICD-10-CM | POA: Diagnosis not present

## 2023-12-01 DIAGNOSIS — N401 Enlarged prostate with lower urinary tract symptoms: Secondary | ICD-10-CM | POA: Diagnosis not present

## 2023-12-01 DIAGNOSIS — R195 Other fecal abnormalities: Secondary | ICD-10-CM

## 2023-12-01 DIAGNOSIS — A0472 Enterocolitis due to Clostridium difficile, not specified as recurrent: Secondary | ICD-10-CM | POA: Diagnosis not present

## 2023-12-01 DIAGNOSIS — R197 Diarrhea, unspecified: Secondary | ICD-10-CM | POA: Diagnosis not present

## 2023-12-01 DIAGNOSIS — D649 Anemia, unspecified: Secondary | ICD-10-CM

## 2023-12-01 DIAGNOSIS — R1310 Dysphagia, unspecified: Secondary | ICD-10-CM | POA: Diagnosis not present

## 2023-12-01 LAB — CBC
HCT: 33.5 % — ABNORMAL LOW (ref 39.0–52.0)
Hemoglobin: 10.7 g/dL — ABNORMAL LOW (ref 13.0–17.0)
MCH: 31.2 pg (ref 26.0–34.0)
MCHC: 31.9 g/dL (ref 30.0–36.0)
MCV: 97.7 fL (ref 80.0–100.0)
Platelets: 269 10*3/uL (ref 150–400)
RBC: 3.43 MIL/uL — ABNORMAL LOW (ref 4.22–5.81)
RDW: 14.5 % (ref 11.5–15.5)
WBC: 11.4 10*3/uL — ABNORMAL HIGH (ref 4.0–10.5)
nRBC: 0 % (ref 0.0–0.2)

## 2023-12-01 LAB — BASIC METABOLIC PANEL
Anion gap: 8 (ref 5–15)
BUN: 13 mg/dL (ref 8–23)
CO2: 25 mmol/L (ref 22–32)
Calcium: 8.2 mg/dL — ABNORMAL LOW (ref 8.9–10.3)
Chloride: 104 mmol/L (ref 98–111)
Creatinine, Ser: 0.89 mg/dL (ref 0.61–1.24)
GFR, Estimated: >60 mL/min (ref >60)
Glucose, Bld: 91 mg/dL (ref 70–99)
Potassium: 3.3 mmol/L — ABNORMAL LOW (ref 3.5–5.1)
Sodium: 137 mmol/L (ref 135–145)

## 2023-12-01 MED ORDER — FIDAXOMICIN 200 MG PO TABS: 200.0000 mg | ORAL_TABLET | Freq: Two times a day (BID) | ORAL | Status: DC

## 2023-12-01 MED ORDER — HYDROCORTISONE 2.5 % EX CREA: TOPICAL_CREAM | Freq: Two times a day (BID) | CUTANEOUS | 0 refills | Status: AC

## 2023-12-01 MED ORDER — PROCHLORPERAZINE MALEATE 5 MG PO TABS: 5.0000 mg | ORAL_TABLET | Freq: Four times a day (QID) | ORAL | 0 refills | Status: AC | PRN

## 2023-12-01 MED ORDER — APIXABAN 5 MG PO TABS: 5.0000 mg | ORAL_TABLET | Freq: Two times a day (BID) | ORAL | Status: DC

## 2023-12-01 MED ORDER — FIDAXOMICIN 200 MG PO TABS
200.0000 mg | ORAL_TABLET | Freq: Two times a day (BID) | ORAL | Status: DC
  Filled 2023-12-01: qty 1

## 2023-12-01 NOTE — Progress Notes (Signed)
 PROGRESS NOTE    [name]  HYQ:[id number] DOB: 06-28-1935 DOA: 11/27/2023 PCP: Chilton Greathouse, MD  Subjective: Pt seen and examined.  Colonoscopy yesterday showed pseudomembrane despite his c. Diff testing being negative. Budesonide was stopped by GI. Pt placed on dificid by GI. Pt has qualified for company sponsored Dificid. Confirmed with outpatient pharmacy that Dificid will be mailed to his home.  Pt ate solid food. Has walked. [name] ready to take him home.  GI said pt can restart his Eliquis tomorrow.   Hospital Course: HPI/Hospital Course: [name] is a 89 y.o. male with medical history significant for hypertension, atrial fibrillation on Eliquis, chronic HFpEF, OSA on CPAP, celiac disease, microscopic colitis, dysphagia, and C. difficile colitis who presents with 1 month of loose stools, more recent dark stool, and worsening fatigue.   Patient reports 1 month of loose stools with up to 10-12 episodes daily.  There has not been any abdominal pain, nausea, vomiting, fever, or chills associated with this.  He has noted his stool to be very dark, essentially black for the past 5 days.  He has experienced progressive generalized weakness and fatigue over the course of this illness.  He denies any chest pain, shortness of breath, or focal numbness or weakness.   ED Course: Upon arrival to the ED, patient is found to be afebrile and saturating well on room air with normal heart rate and stable blood pressure.  Labs are most notable for normal BUN, creatinine 1.25, normal WBC, hemoglobin 12.5, and normal LFTs.   GI was consulted by the ED physician and the patient was treated with Pepcid.   Per GI, clinical picture is complicated by the fact that he has been on iron supplements and Pepto-Bismol which can cause black stool and which have been held since admission, also noncompliant w/ celiac diet, taking magnesium, taking sertraline - all this can also cause loose BM.  Cdiff not detected. GI planninf EGD/colonoscopy Monday 02/17  Significant Events: Admitted 11/27/2023 for diarrhea with dehydration   Significant Labs: WBC 9.7, HgB 12.5, Plt 240 Na 136, K 4.4, BUN 15, Scr 1.25, glu 89 C. Diff antigen negative, toxin negative  Significant Imaging Studies:   Antibiotic Therapy: Anti-infectives (From admission, onward)    None       Procedures: 11-30-2023 EGD/colonoscopy. Showed pseudomembrane on colonoscopy.  Consultants: GI    Assessment and Plan: * Diarrhea with dehydration 11-29-2023 dehydration resolved. GI planning on EGD/colonoscopy tomorrow. NPO after MN. Colonoscopy prep today. 11-30-2023 resolved.  Chronic diarrhea 11-29-2023 GI planning on colonoscopy tomorrow. Started on budesonide 9 mg daily. C. Diff negative. 11-30-2023 stable. Going to EGD/colonoscopy today. Infectious workup is negative. Most likely non-compliance with gluten-free diet.  12-01-2023 colonoscopy showed pseudomembrane despite c. Diff testing negative. Budesonide was stopped. Pt placed on dificid. Outpatient pharmacy has qualified pt for drug company-sponsored patient assistance with meds. Meds will be mailed to his home. Stop PPI. F/u with GI. They will call him with appointment.  CKD stage 3a, GFR 45-59 ml/min (HCC) - baseline Scr 1.2-1.5 11-29-2023 baseline Scr 1.2-1.5. stable. 11-30-2023 stable.   12-01-2023 discharge Scr 0.89  Chronic heart failure with preserved ejection fraction (HFpEF) (HCC) 11-29-2023 euvolemic at this point. Holding lasix for now.  11-30-2023 can resume PRN lasix at home.  Dysphagia 11-29-2023 pt to have EGD tomorrow. 11-30-2023 EGD today.  12-01-2023 seen by ST. MBS performed. Pt with functional oropharyngeal swallow ability, no aspiration or penetration and swallow was timely.   Anticoagulated  11-29-2023 on eliquis at home for hx of PAF. Holding eliquis for EGD/colonoscopy tomorrow. 11-30-2023 holding eliquis for  egd/colonoscopy today.  12-01-2023 communicated with GI team. Pt ok to restart Eliquis tomorrow 12-02-2023.   Celiac disease Prior to 11-29-2023, start Budesonide 9 milligrams per day to see if this helps his diarrhea.  has not been compliant with a gluten-free diet as per his daughter --> gluten free diet reinforced  Per GI, would be prudent to do an EGD and a colonoscopy on 11/30/2023.  Holding Eliquis   11-29-2023 stable. 11-30-2023 stable. Reinforce to pt and dtr anita that pt needs to stay on gluten-free diet.  12-01-2023 pt to remains on gluten-free diet.  Weakness 11-29-2023 stable. Walked 200 ft with PT. PT has signed off.  PAF (paroxysmal atrial fibrillation) (HCC) 11-29-2023 eliquis on hold. Continue flecainide 100 mg bid. 11-30-2023 stable.   12-01-2023 communicated with GI team. Pt ok to restart Eliquis tomorrow 12-02-2023. Continue flecainide 100 mg bid.  OSA on CPAP 11-29-2023 stable. Prn cpap. 11-30-2023 stable.   BPH (benign prostatic hyperplasia) 11-29-2023 continue cardura. Stable. 11-30-2023 stable.  12-01-2023 continue cardura at home.  Essential hypertension, benign 11-29-2023 continue hydralazine prn  11-30-2023 stable. Continue norvasc at home.   DVT prophylaxis: Place and maintain sequential compression device Start: 11/28/23 1436 SCDs Start: 11/27/23 1746    Code Status: Full Code Family Communication: discussed with dtr anita at bedside Disposition Plan: return home Reason for continuing need for hospitalization: medically stable for DC.  Objective: Vitals:   11/30/23 1440 11/30/23 1507 11/30/23 2021 12/01/23 0510  BP: (!) 161/68 (!) 148/70 130/64 (!) 145/67  Pulse: 80 79 70 71  Resp: (!) 23 16 18 20   Temp:  98.3 F (36.8 C) 99.1 F (37.3 C) 98 F (36.7 C)  TempSrc:  Oral Oral Oral  SpO2: 94%  92% 91%  Weight:      Height:        Intake/Output Summary (Last 24 hours) at 12/01/2023 1227 Last data filed at 11/30/2023 2200 Gross per 24  hour  Intake 902.55 ml  Output --  Net 902.55 ml   Filed Weights   11/27/23 1418 11/30/23 1229  Weight: 65.8 kg 65.8 kg   Examination:  Physical Exam Vitals and nursing note reviewed.  Constitutional:      General: He is not in acute distress.    Appearance: He is normal weight. He is not toxic-appearing or diaphoretic.  HENT:     Head: Normocephalic and atraumatic.     Nose: Nose normal.  Eyes:     General: No scleral icterus. Cardiovascular:     Rate and Rhythm: Normal rate and regular rhythm.  Pulmonary:     Effort: Pulmonary effort is normal.     Breath sounds: Normal breath sounds.  Abdominal:     General: Abdomen is flat. Bowel sounds are normal. There is no distension.     Palpations: Abdomen is soft.     Tenderness: There is no abdominal tenderness.  Musculoskeletal:     Right lower leg: No edema.     Left lower leg: No edema.  Skin:    General: Skin is warm and dry.     Capillary Refill: Capillary refill takes less than 2 seconds.  Neurological:     General: No focal deficit present.     Mental Status: He is alert and oriented to person, place, and time.    Data Reviewed: I have personally reviewed following labs and imaging studies  CBC: Recent Labs  Lab 11/26/23 1151 11/27/23 1526 11/28/23 0439 11/29/23 0414 11/30/23 0434 12/01/23 0430  WBC 7.1 9.7 9.8 9.1 12.6* 11.4*  NEUTROABS 4.7  --   --   --   --   --   HGB 12.8* 12.5* 11.1* 11.5* 12.8* 10.7*  HCT 39.3 39.7 34.6* 37.3* 40.3 33.5*  MCV 96.6 99.0 97.2 101.9* 96.9 97.7  PLT 233.0 240 252 249 309 269   Basic Metabolic Panel: Recent Labs  Lab 11/26/23 1151 11/27/23 1526 11/28/23 0439 11/29/23 0414 11/30/23 0434 12/01/23 0430  NA 136 136 136 138 139 137  K 3.8 4.4 3.8 3.6 3.0* 3.3*  CL 100 101 104 105 103 104  CO2 25 24 24 23 23 25   GLUCOSE 90 89 90 91 116* 91  BUN 16 16 9 8 10 13   CREATININE 1.37 1.25* 1.06 0.97 1.19 0.89  CALCIUM 9.2 9.2 8.6* 8.8* 9.3 8.2*  MG 2.2 2.0  --   --    --   --    GFR: Estimated Creatinine Clearance: 53.8 mL/min (by C-G formula based on SCr of 0.89 mg/dL). Liver Function Tests: Recent Labs  Lab 11/26/23 1151 11/27/23 1526  AST 16 17  ALT 10 14  ALKPHOS 57 57  BILITOT 0.3 0.5  PROT 7.5 7.3  ALBUMIN 4.2 3.7   BNP (last 3 results) Recent Labs    12/24/22 0954 01/28/23 1055 09/17/23 1534  BNP 288.4* 134.4* 59.3    Recent Results (from the past 240 hours)  C Difficile Quick Screen w PCR reflex     Status: None   Collection Time: 11/27/23  5:18 PM   Specimen: STOOL  Result Value Ref Range Status   C Diff antigen NEGATIVE NEGATIVE Final   C Diff toxin NEGATIVE NEGATIVE Final   C Diff interpretation No C. difficile detected.  Final    Comment: NEGATIVE Performed at [facility], [address] [place], [place] [address]      Radiology Studies: DG Abd 2 Views Result Date: 11/30/2023 CLINICAL DATA:  Distension EXAM: ABDOMEN - 2 VIEW COMPARISON:  CT 07/06/2020 FINDINGS: No free air beneath the diaphragm. Nonobstructed gas pattern with average stool burden. Partially visualized left hip hardware. IMPRESSION: Nonobstructed gas pattern. Electronically Signed   By: Jasmine Pang M.D.   On: 11/30/2023 15:22    Scheduled Meds:  doxazosin  4 mg Oral Daily   fidaxomicin  200 mg Oral BID   flecainide  100 mg Oral BID   Continuous Infusions:   LOS: 4 days   Time spent: 40 minutes  Carollee Herter, DO  [facility]  12/01/2023, 12:27 PM

## 2023-12-01 NOTE — Telephone Encounter (Signed)
 Patient Product/process development scientist completed.    The patient is insured through [employer]. Patient has Medicare and is not eligible for a copay card, but may be able to apply for patient assistance or Medicare RX Payment Plan (Patient Must reach out to their plan, if eligible for payment plan), if available.    Ran test claim for Dificid 200 mg and the current 10 day co-pay is $1,626.17.   This test claim was processed through [facility]- copay amounts may vary at other pharmacies due to pharmacy/plan contracts, or as the patient moves through the different stages of their insurance plan.     Roland Earl, CPHT Pharmacy Technician III Certified Patient Advocate [facility] Patient Advocate Team Direct Number: [phone]  Fax: [phone]

## 2023-12-01 NOTE — Progress Notes (Addendum)
 MBS completed, full report to follow.  Pt with functional oropharyngeal swallow ability, no aspiration or penetration and swallow was timely.  Patient piecemeals across all boluses, which is functional for him.  Minimal oral retention noted post-swallow that spills into pharynx and is subsequently swallowed after it pools or reaches vallecular space.  Trace retention post-swallow noted which is WFL for his age. Slight prolonged mastication with solid due to recent dentition extraction. Barium tablet taken with thin easily transited through oropharynx.    No SLP follow up indicated, educated pt to findings/recommendations using live video.   [name], MS [facility] SLP Acute [employer] [phone]

## 2023-12-01 NOTE — Plan of Care (Signed)

## 2023-12-01 NOTE — Telephone Encounter (Signed)
 Patient Advocate Encounter  Patient is approved through the [name] for Dificid through 10/12/2024.   Medication will be mailed to Patient's home   [name], CPhT Pharmacy Patient Advocate Specialist [facility] Patient Advocate Team Direct Number: [phone]  Fax: [phone]

## 2023-12-01 NOTE — Progress Notes (Signed)
 Patient will be discharged today:   --Plan is to complete 10 days of Dificid --I will make him a hospital follow up with Dr. Rhea Belton or [facility] May. If no appts available within a few weeks then will try to get appt with  Mike Gip PA who saw him this admission.  --We will need to follow up on colon and duodenal biopsies

## 2023-12-01 NOTE — Discharge Summary (Signed)
 [facility] Physician Discharge Summary   Patient name: [name]  Admit date:     11/27/2023  Discharge date: 12/01/2023  Attending Physician: [name] [[id number]]  Discharge Physician: [name]   PCP: Charles Greathouse, MD  Admitted From: Home  Disposition:  Home  Recommendations for Outpatient Follow-up:  Follow up with PCP in 1-2 weeks Follow up with GI in 2 weeks. They will call with appointment Please follow up on the following pending results: colon biopsy  Home Health:No Equipment/Devices: None    Discharge Condition:Stable CODE STATUS:FULL Diet recommendation:  gluten-free diet Fluid Restriction: None  Hospital Summary: HPI/Hospital Course: [name] is a 89 y.o. male with medical history significant for hypertension, atrial fibrillation on Eliquis, chronic HFpEF, OSA on CPAP, celiac disease, microscopic colitis, dysphagia, and C. difficile colitis who presents with 1 month of loose stools, more recent dark stool, and worsening fatigue.   Patient reports 1 month of loose stools with up to 10-12 episodes daily.  There has not been any abdominal pain, nausea, vomiting, fever, or chills associated with this.  He has noted his stool to be very dark, essentially black for the past 5 days.  He has experienced progressive generalized weakness and fatigue over the course of this illness.  He denies any chest pain, shortness of breath, or focal numbness or weakness.   ED Course: Upon arrival to the ED, patient is found to be afebrile and saturating well on room air with normal heart rate and stable blood pressure.  Labs are most notable for normal BUN, creatinine 1.25, normal WBC, hemoglobin 12.5, and normal LFTs.   GI was consulted by the ED physician and the patient was treated with Pepcid.   Per GI, clinical picture is complicated by the fact that he has been on iron supplements and Pepto-Bismol which can cause black stool and which have been held  since admission, also noncompliant w/ celiac diet, taking magnesium, taking sertraline - all this can also cause loose BM. Cdiff not detected. GI planninf EGD/colonoscopy Monday 02/17  Significant Events: Admitted 11/27/2023 for diarrhea with dehydration   Significant Labs: WBC 9.7, HgB 12.5, Plt 240 Na 136, K 4.4, BUN 15, Scr 1.25, glu 89 C. Diff antigen negative, toxin negative  Significant Imaging Studies:   Antibiotic Therapy: Anti-infectives (From admission, onward)    None       Procedures: 11-30-2023 EGD/colonoscopy. Showed pseudomembrane on colonoscopy.  Consultants: GI   Hospital Course by Problem: * Diarrhea with dehydration 11-29-2023 dehydration resolved. GI planning on EGD/colonoscopy tomorrow. NPO after MN. Colonoscopy prep today. 11-30-2023 resolved.  Chronic diarrhea 11-29-2023 GI planning on colonoscopy tomorrow. Started on budesonide 9 mg daily. C. Diff negative. 11-30-2023 stable. Going to EGD/colonoscopy today. Infectious workup is negative. Most likely non-compliance with gluten-free diet.  12-01-2023 colonoscopy showed pseudomembrane despite c. Diff testing negative. Budesonide was stopped. Pt placed on dificid. Outpatient pharmacy has qualified pt for drug company-sponsored patient assistance with meds. Meds will be mailed to his home. Stop PPI. F/u with GI. They will call him with appointment.  CKD stage 3a, GFR 45-59 ml/min (HCC) - baseline Scr 1.2-1.5 11-29-2023 baseline Scr 1.2-1.5. stable. 11-30-2023 stable.   12-01-2023 discharge Scr 0.89  Chronic heart failure with preserved ejection fraction (HFpEF) (HCC) 11-29-2023 euvolemic at this point. Holding lasix for now.  11-30-2023 can resume PRN lasix at home.  Dysphagia 11-29-2023 pt to have EGD tomorrow. 11-30-2023 EGD today.  12-01-2023 seen by ST. MBS performed. Pt with  functional oropharyngeal swallow ability, no aspiration or penetration and swallow was timely.    Anticoagulated 11-29-2023 on eliquis at home for hx of PAF. Holding eliquis for EGD/colonoscopy tomorrow. 11-30-2023 holding eliquis for egd/colonoscopy today.  12-01-2023 communicated with GI team. Pt ok to restart Eliquis tomorrow 12-02-2023.   Celiac disease Prior to 11-29-2023, start Budesonide 9 milligrams per day to see if this helps his diarrhea.  has not been compliant with a gluten-free diet as per his daughter --> gluten free diet reinforced  Per GI, would be prudent to do an EGD and a colonoscopy on 11/30/2023.  Holding Eliquis   11-29-2023 stable. 11-30-2023 stable. Reinforce to pt and dtr [name] that pt needs to stay on gluten-free diet.  12-01-2023 pt to remains on gluten-free diet.  Weakness 11-29-2023 stable. Walked 200 ft with PT. PT has signed off.  PAF (paroxysmal atrial fibrillation) (HCC) 11-29-2023 eliquis on hold. Continue flecainide 100 mg bid. 11-30-2023 stable.   12-01-2023 communicated with GI team. Pt ok to restart Eliquis tomorrow 12-02-2023. Continue flecainide 100 mg bid.  OSA on CPAP 11-29-2023 stable. Prn cpap. 11-30-2023 stable.   BPH (benign prostatic hyperplasia) 11-29-2023 continue cardura. Stable. 11-30-2023 stable.  12-01-2023 continue cardura at home.  Essential hypertension, benign 11-29-2023 continue hydralazine prn  11-30-2023 stable. Continue norvasc at home.    Discharge Diagnoses:  Principal Problem:   Diarrhea with dehydration Active Problems:   Chronic diarrhea   Essential hypertension, benign   BPH (benign prostatic hyperplasia)   OSA on CPAP   PAF (paroxysmal atrial fibrillation) (HCC)   Weakness   Celiac disease   Anticoagulated   Dysphagia   Chronic heart failure with preserved ejection fraction (HFpEF) (HCC)   CKD stage 3a, GFR 45-59 ml/min (HCC) - baseline Scr 1.2-1.5   Discharge Instructions  Discharge Instructions     Call MD for:  difficulty breathing, headache or visual disturbances   Complete by: As  directed    Call MD for:  extreme fatigue   Complete by: As directed    Call MD for:  hives   Complete by: As directed    Call MD for:  persistant dizziness or light-headedness   Complete by: As directed    Call MD for:  persistant nausea and vomiting   Complete by: As directed    Call MD for:  redness, tenderness, or signs of infection (pain, swelling, redness, odor or green/yellow discharge around incision site)   Complete by: As directed    Call MD for:  severe uncontrolled pain   Complete by: As directed    Call MD for:  temperature >100.4   Complete by: As directed    Diet gluten free   Complete by: As directed    Discharge instructions   Complete by: As directed    1. Follow up with primary care provider in 1-2 weeks after discharge from hospital 2. Dificid pills will be mailed to your home. You should receive them tomorrow in the mail. 3. Gastroenterology will call you with a follow up appointment. In case you don't hear from them, call the office @ [phone] 4. Restart Eliquis on 12-02-2023   Increase activity slowly   Complete by: As directed       Allergies as of 12/01/2023       Reactions   Gluten Meal Other (See Comments)   Celiac Disease   Other Other (See Comments)   NO SEEDED FOODS!!   Wheat Other (See Comments)   Celiac Disease  Medication List     STOP taking these medications    magnesium oxide 400 (240 Mg) MG tablet Commonly known as: MAG-OX   sertraline 50 MG tablet Commonly known as: ZOLOFT       TAKE these medications    acetaminophen 500 MG tablet Commonly known as: TYLENOL Take 1,000 mg by mouth in the morning and at bedtime.   albuterol 108 (90 Base) MCG/ACT inhaler Commonly known as: VENTOLIN HFA Inhale 2 puffs into the lungs every 6 (six) hours as needed for wheezing or shortness of breath.   amLODipine 5 MG tablet Commonly known as: NORVASC Take 5 mg by mouth daily.   apixaban 5 MG Tabs tablet Commonly known  as: ELIQUIS Take 1 tablet (5 mg total) by mouth 2 (two) times daily. Start taking on: December 02, 2023   chlorhexidine 0.12 % solution Commonly known as: PERIDEX 15 mLs by Mouth Rinse route 3 (three) times daily after meals.   diclofenac Sodium 1 % Gel Commonly known as: VOLTAREN Apply 2 g topically 4 (four) times daily as needed (pain.).   doxazosin 8 MG tablet Commonly known as: CARDURA Take 4 mg by mouth daily.   fidaxomicin 200 MG Tabs tablet Commonly known as: DIFICID Take 1 tablet (200 mg total) by mouth 2 (two) times daily.   flecainide 50 MG tablet Commonly known as: TAMBOCOR Take 100 mg by mouth in the morning and at bedtime.   furosemide 20 MG tablet Commonly known as: LASIX Take 1 tablet by mouth as needed or directed by the Heart Failure clinic What changed:  how much to take how to take this when to take this reasons to take this additional instructions   gabapentin 600 MG tablet Commonly known as: NEURONTIN Take 600 mg by mouth at bedtime.   hydrocortisone 2.5 % cream Apply topically 2 (two) times daily for 5 days. Apply to back rash for 5 days only.   loratadine 10 MG tablet Commonly known as: CLARITIN Take 10 mg by mouth in the morning.   montelukast 10 MG tablet Commonly known as: SINGULAIR Take 10 mg by mouth in the morning.   potassium chloride SA 20 MEQ tablet Commonly known as: KLOR-CON M TAKE 1 TABLET EVERY DAY WITH SUPPER What changed: See the new instructions.   PRESCRIPTION MEDICATION CPAP- At bedtime   prochlorperazine 5 MG tablet Commonly known as: COMPAZINE Take 1 tablet (5 mg total) by mouth every 6 (six) hours as needed for nausea or vomiting.   Systane Hydration PF 0.4-0.3 % Soln Generic drug: Polyethyl Glyc-Propyl Glyc PF Place 1 drop into both eyes 3 (three) times daily as needed (for dryness).   zolpidem 10 MG tablet Commonly known as: AMBIEN Take 10 mg by mouth at bedtime.        Allergies  Allergen  Reactions   Gluten Meal Other (See Comments)    Celiac Disease   Other Other (See Comments)    NO SEEDED FOODS!!   Wheat Other (See Comments)    Celiac Disease    Discharge Exam: Vitals:   11/30/23 2021 12/01/23 0510  BP: 130/64 (!) 145/67  Pulse: 70 71  Resp: 18 20  Temp: 99.1 F (37.3 C) 98 F (36.7 C)  SpO2: 92% 91%    Physical Exam Vitals and nursing note reviewed.  Constitutional:      General: He is not in acute distress.    Appearance: He is not toxic-appearing or diaphoretic.  HENT:     Head:  Normocephalic and atraumatic.     Nose: Nose normal.  Eyes:     General: No scleral icterus. Cardiovascular:     Rate and Rhythm: Normal rate and regular rhythm.  Pulmonary:     Effort: Pulmonary effort is normal.     Breath sounds: Normal breath sounds.  Abdominal:     General: Abdomen is flat. Bowel sounds are normal. There is no distension.     Palpations: Abdomen is soft.  Musculoskeletal:     Right lower leg: No edema.     Left lower leg: No edema.  Skin:    General: Skin is warm and dry.     Capillary Refill: Capillary refill takes less than 2 seconds.  Neurological:     Mental Status: He is alert and oriented to person, place, and time.     The results of significant diagnostics from this hospitalization (including imaging, microbiology, ancillary and laboratory) are listed below for reference.    Microbiology: Recent Results (from the past 240 hours)  C Difficile Quick Screen w PCR reflex     Status: None   Collection Time: 11/27/23  5:18 PM   Specimen: STOOL  Result Value Ref Range Status   C Diff antigen NEGATIVE NEGATIVE Final   C Diff toxin NEGATIVE NEGATIVE Final   C Diff interpretation No C. difficile detected.  Final    Comment: NEGATIVE Performed at [facility], [address], [place], [place] [address]      Labs: BNP (last 3 results) Recent Labs    12/24/22 0954 01/28/23 1055 09/17/23 1534  BNP 288.4* 134.4*  59.3   Basic Metabolic Panel: Recent Labs  Lab 11/26/23 1151 11/27/23 1526 11/28/23 0439 11/29/23 0414 11/30/23 0434 12/01/23 0430  NA 136 136 136 138 139 137  K 3.8 4.4 3.8 3.6 3.0* 3.3*  CL 100 101 104 105 103 104  CO2 25 24 24 23 23 25   GLUCOSE 90 89 90 91 116* 91  BUN 16 16 9 8 10 13   CREATININE 1.37 1.25* 1.06 0.97 1.19 0.89  CALCIUM 9.2 9.2 8.6* 8.8* 9.3 8.2*  MG 2.2 2.0  --   --   --   --    Liver Function Tests: Recent Labs  Lab 11/26/23 1151 11/27/23 1526  AST 16 17  ALT 10 14  ALKPHOS 57 57  BILITOT 0.3 0.5  PROT 7.5 7.3  ALBUMIN 4.2 3.7   CBC: Recent Labs  Lab 11/26/23 1151 11/27/23 1526 11/28/23 0439 11/29/23 0414 11/30/23 0434 12/01/23 0430  WBC 7.1 9.7 9.8 9.1 12.6* 11.4*  NEUTROABS 4.7  --   --   --   --   --   HGB 12.8* 12.5* 11.1* 11.5* 12.8* 10.7*  HCT 39.3 39.7 34.6* 37.3* 40.3 33.5*  MCV 96.6 99.0 97.2 101.9* 96.9 97.7  PLT 233.0 240 252 249 309 269   Sepsis Labs Recent Labs  Lab 11/28/23 0439 11/29/23 0414 11/30/23 0434 12/01/23 0430  WBC 9.8 9.1 12.6* 11.4*   Procedures/Studies: DG Abd 2 Views Result Date: 11/30/2023 CLINICAL DATA:  Distension EXAM: ABDOMEN - 2 VIEW COMPARISON:  CT 07/06/2020 FINDINGS: No free air beneath the diaphragm. Nonobstructed gas pattern with average stool burden. Partially visualized left hip hardware. IMPRESSION: Nonobstructed gas pattern. Electronically Signed   By: Jasmine Pang M.D.   On: 11/30/2023 15:22   Procedure:                Colonoscopy changed to flexible sigmoidoscopy (d/t  pseudomembranous colitis) Indications:              Clinically significant diarrhea of unexplained                            origin. Prev colon 06/2019 neg except for                            lymphocytic colitis on Bx. recent C. difficile                            colitis treated with vancomycin. Providers:                Lynann Bologna, MD, Rogue Jury, RN, Rozetta Nunnery, Technician Referring MD:              Medicines:                Monitored Anesthesia Care Complications:            No immediate complications. Estimated Blood Loss:     Estimated blood loss: none. Estimated blood loss:                            none. Procedure:                Pre-Anesthesia Assessment:                           - Prior to the procedure, a History and Physical                            was performed, and patient medications and                            allergies were reviewed. The patient's tolerance of                            previous anesthesia was also reviewed. The risks                            and benefits of the procedure and the sedation                            options and risks were discussed with the patient.                            All questions were answered, and informed consent                            was obtained. Prior Anticoagulants: The patient has                            taken Eliquis (apixaban), last dose was 3 days  prior to procedure. ASA Grade Assessment: III - A                            patient with severe systemic disease. After                            reviewing the risks and benefits, the patient was                            deemed in satisfactory condition to undergo the                            procedure.                           After obtaining informed consent, the colonoscope                            was passed under direct vision. Throughout the                            procedure, the patient's blood pressure, pulse, and                            oxygen saturations were monitored continuously. The                            [id number]-HQ190L ([id number]) Olympus colonoscope was                            introduced through the anus and advanced to the the                            sigmoid colon for evaluation. The FS was performed                            without difficulty.  The patient tolerated the                            procedure well. The quality of the bowel                            preparation was fair. Scope In: 2:05:18 PM Scope Out: 2:12:44 PM Total Procedure Duration: 0 hours 7 minutes 26 seconds  Findings:      A diffuse pseudomembrane was found in the recto-sigmoid colon and in the       sigmoid colon. Biopsies were taken with a cold forceps for histology.      Non-bleeding internal hemorrhoids were found during retroflexion. The       hemorrhoids were small and Grade I (internal hemorrhoids that do not       prolapse). Impression:               - Pseudomembranous colitis.                           -  Non-bleeding internal hemorrhoids. Moderate Sedation:      Not Applicable - Patient had care per Anesthesia. Recommendation:           - Resume previous diet.                           - Start Dificid 200 mg p.o. twice daily x 10 days                           - Contact isolation for now                           - Await pathology results.                           - Consider repeat colonoscopy as outpatient if not                            better.                           - Stop budesonide, magnesium, Zoloft, PPIs.                           - The findings and recommendations were discussed                            with the patient's family.    Procedure:                Upper GI endoscopy Indications:              Dysphagia. H/O esophageal stricture s/p previous                            dilatations. History of celiac disease. Providers:                Lynann Bologna, MD, Rogue Jury, RN, Rozetta Nunnery, Technician Referring MD:              Medicines:                Monitored Anesthesia Care Complications:            No immediate complications. Estimated Blood Loss:     Estimated blood loss: none. Procedure:                Pre-Anesthesia Assessment:                           - Prior to the procedure, a  History and Physical                            was performed, and patient medications and                            allergies were reviewed. The patient's tolerance of  previous anesthesia was also reviewed. The risks                            and benefits of the procedure and the sedation                            options and risks were discussed with the patient.                            All questions were answered, and informed consent                            was obtained. Prior Anticoagulants: The patient has                            taken Eliquis (apixaban), last dose was 3 days                            prior to procedure. ASA Grade Assessment: III - A                            patient with severe systemic disease. After                            reviewing the risks and benefits, the patient was                            deemed in satisfactory condition to undergo the                            procedure.                           After obtaining informed consent, the endoscope was                            passed under direct vision. Throughout the                            procedure, the patient's blood pressure, pulse, and                            oxygen saturations were monitored continuously. The                            [id number]-[id number] ([id number]) Olympus endoscope was introduced                            through the mouth, and advanced to the second part                            of duodenum. The upper GI endoscopy was  accomplished without difficulty. The patient                            tolerated the procedure well. Scope In: Scope Out: Findings:      One benign-appearing, intrinsic mild stenosis was found 38 cm from the       incisors at GE junction. This stenosis measured 1.2 cm (inner diameter)       x less than one cm (in length). The stenosis was traversed. A TTS       dilator was passed through the  scope. Dilation with a 15-16.5-18 mm       balloon dilator was performed to 18 mm. The dilation site was examined       and showed moderate mucosal disruption and moderate improvement in       luminal narrowing. Multiple biopsies were obtained from mid and distal       esophagus.      A 1 cm hiatal hernia was present.      The entire examined stomach was normal. Few small benign appearing       polyps were noted in the body of the stomach. Biopsies were taken with a       cold forceps for histology from antrum and body of the stomach.      The examined duodenum was normal. Biopsies for histology were taken with       a cold forceps for evaluation of celiac disease (2 from the bulb, 4 from       the second portion of the duodenum). Impression:               - Benign-appearing esophageal stenosis. Dilated.                           - 1 cm hiatal hernia.                           - Normal stomach. Biopsied.                           - Normal examined duodenum. Biopsied. Moderate Sedation:      Not Applicable - Patient had care per Anesthesia. Recommendation:           - Patient has a contact number available for                            emergencies. The signs and symptoms of potential                            delayed complications were discussed with the                            patient. Return to normal activities tomorrow.                            Written discharge instructions were provided to the                            patient.                           -  Resume previous diet.                           - Continue present medications.                           - Await pathology results.                           - Proceed with colonoscopy                           - The findings and recommendations were discussed                            with the patient's family.  Time coordinating discharge: 45 mins  SIGNED:  Carollee Herter, DO [facility] 12/01/23, 12:27 PM

## 2023-12-01 NOTE — Procedures (Signed)
 Modified Barium Swallow Study  Patient Details  Name: [name] MRN: [id number] Date of Birth: 09/24/1935  Today's Date: 12/01/2023  Modified Barium Swallow completed.  Full report located under Chart Review in the Imaging Section.  History of Present Illness Per GI notes "[name] is a 89 y.o. male with medical history significant for hypertension, atrial fibrillation on Eliquis, chronic HFpEF, OSA on CPAP, celiac disease, microscopic colitis, dysphagia, and C. difficile colitis who presents with 1 month of loose stools, more recent dark stool, and worsening fatigue."  He is s/p EGD with dilatation of distal narrowing.  GI MD ordered MBS to rule out aspiration.   Clinical Impression Pt with functional oropharyngeal swallow ability, no aspiration or penetration and swallow was timely.  Patient piecemeals across all boluses, which is functional for him.  Minimal oral retention noted post-swallow that spills into pharynx and is subsequently swallowed after it pools or reaches vallecular space.  Trace retention post-swallow noted which is WFL for his age. Slight prolonged mastication with solid due to recent dentition extraction. Barium tablet taken with thin easily transited through oropharynx.    No SLP follow up indicated, educated pt to findings/recommendations using live video. Factors that may increase risk of adverse event in presence of aspiration [name] 2021):    Swallow Evaluation Recommendations Recommendations: PO diet PO Diet Recommendation: Regular;Thin liquids (Level 0)-  Liquid Administration via: Cup;Straw Medication Administration: Whole meds with liquid - with plenty of liquids Supervision: Patient able to self-feed Swallowing strategies  : Slow rate;Small bites/sips Postural changes: Position pt fully upright for meals;Stay upright 30-60 min after meals Oral care recommendations: Oral care BID (2x/day)     [name], MS [facility] SLP Acute Rehab  Services Office [phone]  [name] 12/01/2023,3:07 PM

## 2023-12-01 NOTE — Telephone Encounter (Signed)
 Inbound call from patient's wife requesting a call to discuss what kind of probiotics that were advised for patient to take after yesterday's 2/17 procedure. Please advise, thank you.

## 2023-12-01 NOTE — Progress Notes (Addendum)
 Daily Progress Note  DOA: 11/27/2023 Hospital Day: 5   Chief Complaint: GI bleed  ASSESSMENT    Brief Narrative:  [name] is a 89 y.o. year old male with a history of  GERD, esophageal ring with prior dilation, esophageal dysmotility, prior esophageal candidiasis, celiac disease, history of collagenous colitis, IBS , diverticulosis,  afib (on Eliquis). Patient admitted 2/14 with black diarrhea on iron and bismuth but Heme+. He had been seen a day or so prior in our office for the same in addition to dysphagia and was scheduled for EGD / colonoscopy. He got worse clinically and was admitted to hospital.  GI saw in consult on 2/14.   Black, loose, heme + stool (on iron and bismuth). Pseudomembranous colitis ( ? C-diff) Somewhat confusing picture. C-diff studies negative but found to have pseudomembranous colitis on sigmoidoscopy . Biopsies are pending. Also concern for flare in collagenous colitis but not on budesonide out of concern for C-diff. Probably was heme + due to colitis / Eliquis  [place] anemia / FOBT+. Hgb 10.7, down from baseline of ~12.  Stool presumably black 2/2 to bismuth and oral iron (no evidence for upper GI bleed on EGD). Not iron deficient on oral iron at home.   Dysphagia Benign appearing esophageal stenosis s/p dilation 11/30/23  Celiac disease ( possible) Not really following gluten free diet and celiac antibodies still normal. Duodenum looked normal on EGD, biopsies are pending.   Mild hypokalemia Improving 3.0 >> 3.3   Principal Problem:   Diarrhea with dehydration Active Problems:   Essential hypertension, benign   BPH (benign prostatic hyperplasia)   OSA on CPAP   Chronic diarrhea   PAF (paroxysmal atrial fibrillation) (HCC)   Weakness   Celiac disease   Anticoagulated   Dysphagia   Chronic heart failure with preserved ejection fraction (HFpEF) (HCC)   CKD stage 3a, GFR 45-59 ml/min (HCC) - baseline Scr 1.2-1.5   PLAN   --Continue  Dificid --Budesonide already stopped --PPI stopped due to C-diff / concern for possible collagenous colitis --Await colon biopsies --Await duodenal biopsies --Will change to a gluten free diet --Given suspicion for C-diff / ongoing treatment for it I will put on isolation precautions.  --Patient really wants to go home today. Diarrhea improved, tolerating diet. Wants to get home before the snow.   Subjective   Diarrhea improving. Stools no longer black. Tolerating diet. Wants to get home before the snow.    Objective    Sigmoidoscopy 11/30/23 Pseudomembranous colitis. Non-bleeding internal hemorrhoids  EGD 11/29/24 - Benign-appearing esophageal stenosis. Dilated. - 1 cm hiatal hernia. - Normal stomach. Biopsied. - Normal examined duodenum. Biopsied  Recent Labs    11/29/23 0414 11/30/23 0434 12/01/23 0430  WBC 9.1 12.6* 11.4*  HGB 11.5* 12.8* 10.7*  HCT 37.3* 40.3 33.5*  PLT 249 309 269   BMET Recent Labs    11/29/23 0414 11/30/23 0434 12/01/23 0430  NA 138 139 137  K 3.6 3.0* 3.3*  CL 105 103 104  CO2 23 23 25   GLUCOSE 91 116* 91  BUN 8 10 13   CREATININE 0.97 1.19 0.89  CALCIUM 8.8* 9.3 8.2*   LFT No results for input(s): "PROT", "ALBUMIN", "AST", "ALT", "ALKPHOS", "BILITOT", "BILIDIR", "IBILI" in the last 72 hours. PT/INR No results for input(s): "LABPROT", "INR" in the last 72 hours.   Imaging:  DG Abd 2 Views CLINICAL DATA:  Distension  EXAM: ABDOMEN - 2 VIEW  COMPARISON:  CT 07/06/2020  FINDINGS: No free air beneath the diaphragm. Nonobstructed gas pattern with average stool burden. Partially visualized left hip hardware.  IMPRESSION: Nonobstructed gas pattern.  Electronically Signed   By: Jasmine Pang M.D.   On: 11/30/2023 15:22     Scheduled inpatient medications:   doxazosin  4 mg Oral Daily   fidaxomicin  200 mg Oral BID   flecainide  100 mg Oral BID   Continuous inpatient infusions:  PRN inpatient medications: acetaminophen  **OR** acetaminophen, albuterol, hydrALAZINE, prochlorperazine, zolpidem  Vital signs in last 24 hours: Temp:  [98 F (36.7 C)-99.1 F (37.3 C)] 98 F (36.7 C) (02/18 0510) Pulse Rate:  [70-85] 71 (02/18 0510) Resp:  [16-23] 20 (02/18 0510) BP: (117-177)/(49-76) 145/67 (02/18 0510) SpO2:  [91 %-99 %] 91 % (02/18 0510) Weight:  [65.8 kg] 65.8 kg (02/17 1229) Last BM Date : 12/01/23  Intake/Output Summary (Last 24 hours) at 12/01/2023 0927 Last data filed at 11/30/2023 2200 Gross per 24 hour  Intake 902.55 ml  Output --  Net 902.55 ml    Intake/Output from previous day: 02/17 0701 - 02/18 0700 In: 902.6 [P.O.:480; I.V.:422.6] Out: -  Intake/Output this shift: No intake/output data recorded.   Physical Exam:  General: Alert male in NAD Heart:  Regular rate Pulmonary: Normal respiratory effort Abdomen: Soft, protuberant,  nontender. Normal bowel sounds.  Neurologic: Alert and oriented Psych: Pleasant. Cooperative. Insight appears normal.      LOS: 4 days   Willette Cluster ,NP 12/01/2023, 9:27 AM    Attending physician's note   I have reviewed the chart and didn't examine the patient (D/C home by the time I made my rounds). I performed a substantive portion of this encounter, including complete performance of at least one of the key components, in conjunction with the APP. I agree with the Advanced Practitioner's note, impression and recommendations.   Pseudomembranous (likely C. Difficile) colitis on FS. 2nd episode.  Although stool tests were -ve.  Plan: -Dificid 200mg  po BID x 10 days -Avoid other antibiotics. -Stop budesonide, PPIs -Follow duodenal/colonic bx -Resume Eliquis from tomorrow. -FU GI in 3-4 weeks.  If full colonoscopy is needed (if still with problems), can always be considered as outpt.  Would recommend against screening colon d/t age.   Edman Circle, MD [facility] [phone]

## 2023-12-02 LAB — RETICULIN ANTIBODIES, IGA W TITER: Reticulin Ab, IgA: NEGATIVE {titer} (ref <2.5)

## 2023-12-02 LAB — SURGICAL PATHOLOGY

## 2023-12-02 NOTE — Telephone Encounter (Signed)
 Pt had questions about prebiotics and probiotics.  All questions were answered. Pt verbalized understanding with all questions answered.

## 2023-12-03 ENCOUNTER — Encounter: Payer: Self-pay | Admitting: Gastroenterology

## 2023-12-03 ENCOUNTER — Ambulatory Visit: Payer: Medicare HMO | Admitting: Gastroenterology

## 2023-12-07 ENCOUNTER — Encounter: Payer: Medicare HMO | Admitting: Gastroenterology

## 2023-12-08 ENCOUNTER — Telehealth: Payer: Self-pay

## 2023-12-08 NOTE — Telephone Encounter (Signed)
 Pt wife [name] stated that pt is having at least 10 loose stools a day. Chart was reviewed and noted that pt recent had Colon and EGD on 11/30/2023. Started of Dificid. Pt currently on day number 5 of 10 of prescription.  Pt wife requesting something additional to help assist with the diarrhea.  Please review and advise.

## 2023-12-08 NOTE — Telephone Encounter (Signed)
-----   Message from Willette Cluster sent at 12/08/2023  1:25 PM EST ----- Sorry,  I saw this message too late but looks like he didn't make it in to see Amy on 2/10 anyway.   If possible, please get him a follow up with either Pyrtle, Deanna, or Marchelle Folks as they have all seen him recently. Amy is hospital only now so she isn't an option. I see he has an appt with [facility]. I don't think she knows him so best to put in with someone who has recently been involved in his care. However, if just not possible then I understand.  Thanks  Thanks ----- Message ----- From: Marisa Sprinkles, RN Sent: 12/01/2023   1:02 PM EST To: Meredith Pel, NP  Hi, it looks like the patient's duaghter called in on 11/25/23. Pt is scheduled for a follow up with Amy on 12/03/23. Do I still need to move the appt? ----- Message ----- From: Meredith Pel, NP Sent: 12/01/2023  12:25 PM EST To: Marisa Sprinkles, RN  [place],  Please find a hospital follow up appt for this patient with either Dr. Rhea Belton or Deanna in about 3-5 weeks. If nothing available within timeframe then please check Amy's schedule.   Notes:  Hospital follow up for pseudomembranous colitis , dysphagia   Thanks

## 2023-12-08 NOTE — Telephone Encounter (Signed)
 Called and spoke with patient's wife, [name]. We have rescheduled pt's office visit to Wednesday, 12/16/23 at 1:30 pm with [facility], NP.

## 2023-12-08 NOTE — Telephone Encounter (Signed)
 PT returning call. Please advise.

## 2023-12-08 NOTE — Telephone Encounter (Signed)
 Left message for pt to call back

## 2023-12-08 NOTE — Telephone Encounter (Signed)
 Patients wife called stated the patient is experiencing diarrhea. Please advise.

## 2023-12-09 ENCOUNTER — Other Ambulatory Visit: Payer: Self-pay

## 2023-12-09 DIAGNOSIS — R197 Diarrhea, unspecified: Secondary | ICD-10-CM

## 2023-12-09 DIAGNOSIS — I4891 Unspecified atrial fibrillation: Secondary | ICD-10-CM | POA: Diagnosis not present

## 2023-12-09 DIAGNOSIS — I129 Hypertensive chronic kidney disease with stage 1 through stage 4 chronic kidney disease, or unspecified chronic kidney disease: Secondary | ICD-10-CM | POA: Diagnosis not present

## 2023-12-09 DIAGNOSIS — A0472 Enterocolitis due to Clostridium difficile, not specified as recurrent: Secondary | ICD-10-CM | POA: Diagnosis not present

## 2023-12-09 DIAGNOSIS — K219 Gastro-esophageal reflux disease without esophagitis: Secondary | ICD-10-CM | POA: Diagnosis not present

## 2023-12-09 MED ORDER — DIPHENOXYLATE-ATROPINE 2.5-0.025 MG PO TABS: 1.0000 | ORAL_TABLET | Freq: Three times a day (TID) | ORAL | 0 refills | Status: AC | PRN

## 2023-12-09 NOTE — Telephone Encounter (Signed)
 Please be sure he is in fact taking the dificid as directed He can use lomotil 1 tab TID PRN for up to 5 days while he is finishing dificid therapy If worsening, fever, chills, abd pain they should let us know asap

## 2023-12-09 NOTE — Telephone Encounter (Signed)
 Pt wife [name] was made aware of Dr. Rhea Belton recommendations: Prescription was called into [facility]. [name] made aware. [name] verbalized understanding with all questions answered.

## 2023-12-14 DIAGNOSIS — E785 Hyperlipidemia, unspecified: Secondary | ICD-10-CM | POA: Diagnosis not present

## 2023-12-14 DIAGNOSIS — I130 Hypertensive heart and chronic kidney disease with heart failure and stage 1 through stage 4 chronic kidney disease, or unspecified chronic kidney disease: Secondary | ICD-10-CM | POA: Diagnosis not present

## 2023-12-14 DIAGNOSIS — R5383 Other fatigue: Secondary | ICD-10-CM | POA: Diagnosis not present

## 2023-12-14 DIAGNOSIS — D649 Anemia, unspecified: Secondary | ICD-10-CM | POA: Diagnosis not present

## 2023-12-14 DIAGNOSIS — I509 Heart failure, unspecified: Secondary | ICD-10-CM | POA: Diagnosis not present

## 2023-12-14 DIAGNOSIS — N1831 Chronic kidney disease, stage 3a: Secondary | ICD-10-CM | POA: Diagnosis not present

## 2023-12-14 DIAGNOSIS — M810 Age-related osteoporosis without current pathological fracture: Secondary | ICD-10-CM | POA: Diagnosis not present

## 2023-12-14 DIAGNOSIS — N400 Enlarged prostate without lower urinary tract symptoms: Secondary | ICD-10-CM | POA: Diagnosis not present

## 2023-12-16 ENCOUNTER — Ambulatory Visit: Payer: Medicare HMO | Admitting: Gastroenterology

## 2023-12-21 DIAGNOSIS — N1831 Chronic kidney disease, stage 3a: Secondary | ICD-10-CM | POA: Diagnosis not present

## 2023-12-21 DIAGNOSIS — H8120 Vestibular neuronitis, unspecified ear: Secondary | ICD-10-CM | POA: Diagnosis not present

## 2023-12-21 DIAGNOSIS — R82998 Other abnormal findings in urine: Secondary | ICD-10-CM | POA: Diagnosis not present

## 2023-12-21 DIAGNOSIS — F0390 Unspecified dementia without behavioral disturbance: Secondary | ICD-10-CM | POA: Diagnosis not present

## 2023-12-21 DIAGNOSIS — Z0000 Encounter for general adult medical examination without abnormal findings: Secondary | ICD-10-CM | POA: Diagnosis not present

## 2023-12-21 DIAGNOSIS — I509 Heart failure, unspecified: Secondary | ICD-10-CM | POA: Diagnosis not present

## 2023-12-21 DIAGNOSIS — I130 Hypertensive heart and chronic kidney disease with heart failure and stage 1 through stage 4 chronic kidney disease, or unspecified chronic kidney disease: Secondary | ICD-10-CM | POA: Diagnosis not present

## 2023-12-21 DIAGNOSIS — K900 Celiac disease: Secondary | ICD-10-CM | POA: Diagnosis not present

## 2023-12-21 DIAGNOSIS — I10 Essential (primary) hypertension: Secondary | ICD-10-CM | POA: Diagnosis not present

## 2023-12-21 DIAGNOSIS — I4891 Unspecified atrial fibrillation: Secondary | ICD-10-CM | POA: Diagnosis not present

## 2023-12-24 DIAGNOSIS — R972 Elevated prostate specific antigen [PSA]: Secondary | ICD-10-CM | POA: Diagnosis not present

## 2023-12-24 DIAGNOSIS — N401 Enlarged prostate with lower urinary tract symptoms: Secondary | ICD-10-CM | POA: Diagnosis not present

## 2023-12-24 DIAGNOSIS — R351 Nocturia: Secondary | ICD-10-CM | POA: Diagnosis not present

## 2024-01-01 ENCOUNTER — Telehealth: Payer: Self-pay | Admitting: Internal Medicine

## 2024-01-01 NOTE — Telephone Encounter (Signed)
 Called to schedule recall

## 2024-01-05 ENCOUNTER — Other Ambulatory Visit: Payer: Self-pay | Admitting: Cardiology

## 2024-01-11 ENCOUNTER — Other Ambulatory Visit: Payer: Self-pay | Admitting: Internal Medicine

## 2024-01-11 ENCOUNTER — Telehealth: Payer: Self-pay | Admitting: Internal Medicine

## 2024-01-11 NOTE — Telephone Encounter (Signed)
 Requesting to speak with nurse in regards to medication. Please advise.

## 2024-01-11 NOTE — Telephone Encounter (Signed)
 Patient's wife [name]) reports her husband started having recurrent loose stools this past Saturday. She reports he had 10-11 loose stools from 10:30 pm-7:30 am Saturday night. Patient's wife states her husband ran out of Lomotil which really helped and his appointment is not until 01/21/24. Patient is requesting a refill sent to [facility] on [address]. Please advise Dr. Rhea Belton.

## 2024-01-12 ENCOUNTER — Other Ambulatory Visit: Payer: Self-pay

## 2024-01-12 DIAGNOSIS — R197 Diarrhea, unspecified: Secondary | ICD-10-CM

## 2024-01-12 MED ORDER — DIPHENOXYLATE-ATROPINE 2.5-0.025 MG PO TABS: 1.0000 | ORAL_TABLET | Freq: Four times a day (QID) | ORAL | 0 refills | Status: DC | PRN

## 2024-01-12 NOTE — Telephone Encounter (Signed)
 Informed patient's wife that I will fax the Lomotil prescription to her husband's pharmacy. Also, informed her that her husband needs to obtain a stool sample for C. Diff to rule out infectious diarrhea. Patient's wife verbalized understanding and will pick up stool kit.

## 2024-01-14 ENCOUNTER — Other Ambulatory Visit

## 2024-01-14 DIAGNOSIS — R197 Diarrhea, unspecified: Secondary | ICD-10-CM | POA: Diagnosis not present

## 2024-01-15 ENCOUNTER — Other Ambulatory Visit: Payer: Self-pay

## 2024-01-15 ENCOUNTER — Ambulatory Visit: Payer: Medicare HMO | Admitting: Gastroenterology

## 2024-01-15 LAB — CLOSTRIDIUM DIFFICILE BY PCR: Toxigenic C. Difficile by PCR: POSITIVE — AB

## 2024-01-15 MED ORDER — VANCOMYCIN HCL 125 MG PO CAPS
125.0000 mg | ORAL_CAPSULE | Freq: Four times a day (QID) | ORAL | 0 refills | Status: DC
Start: 2024-01-15 — End: 2024-02-16

## 2024-01-21 ENCOUNTER — Encounter: Payer: Self-pay | Admitting: Gastroenterology

## 2024-01-21 ENCOUNTER — Ambulatory Visit: Admitting: Gastroenterology

## 2024-01-21 VITALS — BP 100/50 | HR 75 | Ht 67.0 in | Wt 139.0 lb

## 2024-01-21 DIAGNOSIS — K219 Gastro-esophageal reflux disease without esophagitis: Secondary | ICD-10-CM

## 2024-01-21 DIAGNOSIS — D509 Iron deficiency anemia, unspecified: Secondary | ICD-10-CM | POA: Diagnosis not present

## 2024-01-21 DIAGNOSIS — A0472 Enterocolitis due to Clostridium difficile, not specified as recurrent: Secondary | ICD-10-CM

## 2024-01-21 DIAGNOSIS — R1319 Other dysphagia: Secondary | ICD-10-CM

## 2024-01-21 DIAGNOSIS — K900 Celiac disease: Secondary | ICD-10-CM | POA: Diagnosis not present

## 2024-01-21 DIAGNOSIS — K222 Esophageal obstruction: Secondary | ICD-10-CM | POA: Diagnosis not present

## 2024-01-21 DIAGNOSIS — R197 Diarrhea, unspecified: Secondary | ICD-10-CM

## 2024-01-21 MED ORDER — SACCHAROMYCES BOULARDII 250 MG PO CAPS: 250.0000 mg | ORAL_CAPSULE | Freq: Two times a day (BID) | ORAL | 0 refills | Status: AC

## 2024-01-21 NOTE — Progress Notes (Signed)
 Chief Complaint:follow up hospitalization Primary GI Doctor:Dr. Rhea Belton  HPI: 89 year old male with a history of GERD, esophageal ring with prior dilation, esophageal dysmotility, prior esophageal candidiasis,afib (on Eliquis), celiac disease, history of collagenous colitis, IBS with intermittent abdominal pain, diverticulosis who is here for follow-up for recent hospitalization.  He was in the hospital with atrial fibrillation and acute heart failure secondary to A-fib.  He was cardioverted on 12/16/2022 after TEE.     Dr. Rhea Belton performed an EGD on 01/09/2022. He had mild esophagitis and balloon dilation of GE junction stricture to 16 mm.    On 12/19/2022 patient seen in the GI clinic by Dr. Rhea Belton.  At that time patient was having some intermittent solid food dysphagia.  Patient was on famotidine 40 mg twice daily.  He follows a strict gluten-free diet.  His bowel habits at that time were regular with no diarrhea.  At that time it was decided not to do dilatation due to recently being cardioverted and placed on Eliquis.  Can consider repeat upper endoscopy in the future if symptoms persist.   On 11/26/2023 patient seen in office by myself for main complaint of diarrhea.  Patient was having up to 10 loose stools per day with urgency.  Patient also reported he noticed his stools were dark and tarry for about 3 days. Positive fecal occult stool in clinic. It is important to recall patient has history of collagenous colitis that has been in remission since 11/2019, however his granddaughter informs me during visit that the patient and his wife both got Cdiff back in [place] 2024 and he was treated with antibiotics.  She reports that his wife suffered severe symptoms and ended up in the hospital.  Patient reports his primary doctor just recently rechecked his stool to make sure he did not have C. difficile and was negative.        Patient also has history of GERD and taking famotidine 40 mg po daily. Patient  continues with esophageal dysphagia several days a week. He also has some regurgitation. Patient denies nausea, vomiting.  Patient has lost about 5 pounds since December.   Patient has history of PAF,new-onset 2/24 now maintaining NSR on flecainide 100 bid and Eliquis per last cardiology note 09/17/23.   On 11/27/2023 patient called office and reports feeling lethargic and very weak.  Directed patient to ER and admitted. Coming into the ED he has a stable hemoglobin of 12.8, with normal MCV. He does have an iron deficiency on labs, MCV 96. BUN 16, creatinine 1.25, slightly higher than normal. In questioning him about his celiac disease, he is really not on a gluten-free diet and consuming gluten routinely. Suspect he very well could have active celiac disease associated with his iron deficiency. He also has a history of microscopic colitis, that could be active, especially now back on sertraline which is associated with microscopic colitis.   11/30/23 Colonoscopy and EGD with Dr. [name]: EGD Benign appearing esophageal stenosis s/p dilation 11/30/23. Not really following gluten free diet and celiac antibodies still normal. Duodenum looked normal on EGD, biopsies were negative. Colonoscopy A diffuse pseudomembrane was found in the recto- sigmoid colon and in the sigmoid colon. Biopsies showed acute colitis. Start Dificid 200 mg p. o. twice daily x 10 days. Stop budesonide, magnesium, Zoloft, PPIs.  01/14/24 Positive Toxigenic C. Difficile by PCR  Started on Vancomycin 125 mg 4 times daily x 10 days  Interval History     Patient presents for follow-up after  recent hospitalization, accompanied by his daughter. During hospitalization colonoscopy with biopsy revealed Pseudomembranous (likely C. Difficile) colitis on FS. 2nd episode. Although stool tests were negative. He was treated with Dificid 200 mg p. o. twice daily x 10 days. They stopped budesonide, magnesium, Zoloft, PPIs. Shortly after discharged  patient started to have diarrhea again and Positive Toxigenic C. Difficile by PCR pn 01/14/24. Patient started on Lomotil prn and he states he uses 1 tablet four times a day and Vancomycin 125 mg 4 times daily x 10 days , on day 6 of 10. Patient taking probiotics 1 tablet po daily his daughter got for him.     Patient states he has on average about 6-7 stools per day, typically first thing in the morning, after every meal, and sometimes in the middle of the night. He is having a lot of urgency with some stool incontinence. No abdominal cramping or blood in stool.   Patient denies dysphagia, EGD showed benign appearing esophageal stenosis s/p dilation on 11/30/23. He has lost 6lbs. He reports some generalized weakness.   Patient taking Eliquis 5mg  po twice daily. No dark tarry stools.   Wt Readings from Last 3 Encounters:  01/21/24 139 lb (63 kg)  11/30/23 145 lb 1 oz (65.8 kg)  11/26/23 145 lb (65.8 kg)    Past Medical History:  Diagnosis Date   Anemia    Arthritis    osteoarthritis  back   Celiac disease    Chronic kidney disease    Collagenous colitis    Diverticulosis    Enlarged prostate    followed by dr  [name]   Esophageal dysmotility    Esophageal dysmotility    Esophageal stenosis    Essential hypertension, benign 03/29/2014   External hemorrhoids    External hemorrhoids    Full dentures    GERD (gastroesophageal reflux disease)    Hemorrhoids    Hiatal hernia    IBS (irritable bowel syndrome)    Internal hemorrhoids    OSA on CPAP 10/17/2018   Osteoporosis    Postoperative anemia due to acute blood loss 03/29/2014   Schatzki's ring    Sleep apnea    CPAP Machine   Ulcer     Past Surgical History:  Procedure Laterality Date   APPENDECTOMY  1963   BALLOON DILATION N/A 11/30/2023   Procedure: Marvis Repress DILATION;  Surgeon: Lynann Bologna, MD;  Location: [facility];  Service: Gastroenterology;  Laterality: N/A;   BIOPSY  11/30/2023   Procedure: BIOPSY;  Surgeon: Lynann Bologna, MD;  Location: [facility];  Service: Gastroenterology;;   CARDIOVERSION N/A 12/16/2022   Procedure: CARDIOVERSION;  Surgeon: Lewayne Bunting, MD;  Location: [facility] ENDOSCOPY;  Service: Cardiovascular;  Laterality: N/A;   ESOPHAGOGASTRODUODENOSCOPY  01/09/2022   ESOPHAGOGASTRODUODENOSCOPY (EGD) WITH PROPOFOL N/A 11/30/2023   Procedure: ESOPHAGOGASTRODUODENOSCOPY (EGD) WITH PROPOFOL;  Surgeon: Lynann Bologna, MD;  Location: [facility] ENDOSCOPY;  Service: Gastroenterology;  Laterality: N/A;   EYE SURGERY Bilateral    cataract removal - implants   FLEXIBLE SIGMOIDOSCOPY N/A 11/30/2023   Procedure: FLEXIBLE SIGMOIDOSCOPY;  Surgeon: Lynann Bologna, MD;  Location: [facility] END[facility];  Service: Gastroenterology;  Laterality: N/A;   HERNIA REPAIR  2002   right inguinal    HIP SURGERY Left    INGUINAL HERNIA REPAIR  08/19/2011   Procedure: HERNIA REPAIR  left INGUINAL ADULT;  Surgeon: Adolph Pollack, MD;  Location: [facility]S;  Service: General;  Laterality: Left;   INTRAMEDULLARY (IM) NAIL INTERTROCHANTERIC Left 03/27/2014  Procedure: INTRAMEDULLARY (IM) NAIL INTERTROCHANTRIC;  Surgeon: Shelda Pal, MD;  Location: [facility];  Service: Orthopedics;  Laterality: Left;   RIGHT/LEFT HEART CATH AND CORONARY ANGIOGRAPHY N/A 03/12/2017   Procedure: Right/Left Heart Cath and Coronary Angiography;  Surgeon: Dolores Patty, MD;  Location: [facility];  Service: Cardiovascular;  Laterality: N/A;   TEE WITHOUT CARDIOVERSION N/A 12/16/2022   Procedure: TRANSESOPHAGEAL ECHOCARDIOGRAM (TEE);  Surgeon: Lewayne Bunting, MD;  Location: [facility] ENDOSCOPY;  Service: Cardiovascular;  Laterality: N/A;   UPPER GASTROINTESTINAL ENDOSCOPY  2008    Current Outpatient Medications  Medication Sig Dispense Refill   acetaminophen (TYLENOL) 500 MG tablet Take 1,000 mg by mouth in the morning and at bedtime.     albuterol (PROVENTIL HFA;VENTOLIN HFA) 108 (90 Base) MCG/ACT inhaler Inhale 2 puffs into the lungs every 6 (six) hours as  needed for wheezing or shortness of breath.     amLODipine (NORVASC) 5 MG tablet Take 5 mg by mouth daily.     apixaban (ELIQUIS) 5 MG TABS tablet Take 1 tablet (5 mg total) by mouth 2 (two) times daily.     chlorhexidine (PERIDEX) 0.12 % solution 15 mLs by Mouth Rinse route 3 (three) times daily after meals.     diclofenac Sodium (VOLTAREN) 1 % GEL Apply 2 g topically 4 (four) times daily as needed (pain.).     diphenoxylate-atropine (LOMOTIL) 2.5-0.025 MG tablet Take 1 tablet by mouth 4 (four) times daily as needed for diarrhea or loose stools. 30 tablet 0   doxazosin (CARDURA) 8 MG tablet Take 4 mg by mouth daily.     fidaxomicin (DIFICID) 200 MG TABS tablet Take 1 tablet (200 mg total) by mouth 2 (two) times daily.     flecainide (TAMBOCOR) 50 MG tablet Take 2 tablets (100 mg total) by mouth 2 (two) times daily. 180 tablet 0   furosemide (LASIX) 20 MG tablet Take 1 tablet by mouth as needed or directed by the Heart Failure clinic (Patient taking differently: Take 20 mg by mouth daily as needed for edema or fluid.) 15 tablet 11   gabapentin (NEURONTIN) 600 MG tablet Take 600 mg by mouth at bedtime.     loratadine (CLARITIN) 10 MG tablet Take 10 mg by mouth in the morning.     montelukast (SINGULAIR) 10 MG tablet Take 10 mg by mouth in the morning.     potassium chloride SA (KLOR-CON M) 20 MEQ tablet TAKE 1 TABLET EVERY DAY WITH SUPPER (Patient taking differently: Take 20 mEq by mouth every evening.) 90 tablet 1   PRESCRIPTION MEDICATION CPAP- At bedtime     prochlorperazine (COMPAZINE) 5 MG tablet Take 1 tablet (5 mg total) by mouth every 6 (six) hours as needed for nausea or vomiting. 30 tablet 0   saccharomyces boulardii (FLORASTOR) 250 MG capsule Take 1 capsule (250 mg total) by mouth 2 (two) times daily. 180 capsule 0   SYSTANE HYDRATION PF 0.4-0.3 % SOLN Place 1 drop into both eyes 3 (three) times daily as needed (for dryness).     vancomycin (VANCOCIN) 125 MG capsule Take 1 capsule (125 mg  total) by mouth 4 (four) times daily. 40 capsule 0   zolpidem (AMBIEN) 10 MG tablet Take 10 mg by mouth at bedtime.     No current facility-administered medications for this visit.    Allergies as of 01/21/2024 - Review Complete 01/21/2024  Allergen Reaction Noted   Gluten meal Other (See Comments) 05/27/2023   Other Other (See  Comments) 11/27/2023   Wheat Other (See Comments) 11/18/2021    Family History  Problem Relation Age of Onset   Cervical cancer Mother    Colon cancer Mother    Stroke Father    Heart attack Father    Breast cancer Sister    Throat cancer Brother    Liver cancer Brother    Brain cancer Brother    Esophageal cancer Neg Hx    Rectal cancer Neg Hx    Stomach cancer Neg Hx     Review of Systems:    Constitutional: No weight loss, fever, chills, weakness or fatigue HEENT: Eyes: No change in vision               Ears, Nose, Throat:  No change in hearing or congestion Skin: No rash or itching Cardiovascular: No chest pain, chest pressure or palpitations   Respiratory: No SOB or cough Gastrointestinal: See HPI and otherwise negative Genitourinary: No dysuria or change in urinary frequency Neurological: No headache, dizziness or syncope Musculoskeletal: No new muscle or joint pain Hematologic: No bleeding or bruising Psychiatric: No history of depression or anxiety    Physical Exam:  Vital signs: BP (!) 100/50   Pulse 75   Ht 5\' 7"  (1.702 m)   Wt 139 lb (63 kg)   SpO2 95%   BMI 21.77 kg/m   Constitutional:   Pleasant male appears to be in NAD, Well developed, Well nourished, alert and cooperative Throat: Oral cavity and pharynx without inflammation, swelling or lesion.  Respiratory: Respirations even and unlabored. Lungs clear to auscultation bilaterally.   No wheezes, crackles, or rhonchi.  Cardiovascular: Normal S1, S2. Regular rate and rhythm. No peripheral edema, cyanosis or pallor.  Gastrointestinal:  Soft, nondistended, nontender. No  rebound or guarding. Hyperactive bowel sounds. No appreciable masses or hepatomegaly. Rectal:  Not performed.  Msk:  Symmetrical without gross deformities. Without edema, no deformity or joint abnormality.  Neurologic:  Alert and  oriented x4;  grossly normal neurologically.  Skin:   Dry and intact without significant lesions or rashes. Psychiatric: Oriented to person, place and time. Demonstrates good judgement and reason without abnormal affect or behaviors.  RELEVANT LABS AND IMAGING: CBC    Latest Ref Rng & Units 12/01/2023    4:30 AM 11/30/2023    4:34 AM 11/29/2023    4:14 AM  CBC  WBC 4.0 - 10.5 K/uL 11.4  12.6  9.1   Hemoglobin 13.0 - 17.0 g/dL 91.4  78.2  95.6   Hematocrit 39.0 - 52.0 % 33.5  40.3  37.3   Platelets 150 - 400 K/uL 269  309  249      CMP     Latest Ref Rng & Units 12/01/2023    4:30 AM 11/30/2023    4:34 AM 11/29/2023    4:14 AM  CMP  Glucose 70 - 99 mg/dL 91  213  91   BUN 8 - 23 mg/dL 13  10  8    Creatinine 0.61 - 1.24 mg/dL 0.86  5.78  4.69   Sodium 135 - 145 mmol/L 137  139  138   Potassium 3.5 - 5.1 mmol/L 3.3  3.0  3.6   Chloride 98 - 111 mmol/L 104  103  105   CO2 22 - 32 mmol/L 25  23  23    Calcium 8.9 - 10.3 mg/dL 8.2  9.3  8.8      Lab Results  Component Value Date   TSH 1.595 12/12/2022  11/27/2023 C. difficile quick screen with PCR-negative 01/14/2024 C. difficile PCR positive 11/30/23 ABD- 2 view IMPRESSION: Nonobstructed gas pattern. 12/01/2023 modified barium swallow study ordered to rule out aspiration. Clinical Impression: Pt with functional oropharyngeal swallow ability, no aspiration or penetration and swallow was timely. Patient piecemeals across all boluses, which is functional for him. Minimal oral retention noted post-swallow that spills into pharynx and is subsequently swallowed after it pools or reaches vallecular space. Trace retention post-swallow noted which is WFL for his age. Slight prolonged mastication with solid due to  recent dentition extraction. Barium tablet taken with thin easily transited through oropharynx. No SLP follow up indicated, educated pt to findings/recommendations using live video   Assessment: Encounter Diagnoses  Name Primary?   Diarrhea, unspecified type Yes   Clostridium difficile diarrhea    Gastroesophageal reflux disease without esophagitis    Celiac disease    Esophageal dysphagia    Iron deficiency anemia, unspecified iron deficiency anemia type    Esophageal stricture       89 year old male patient who presents for follow-up after recent hospitalization. Patient admitted 2/14 with black diarrhea on iron and bismuth but Heme+.Somewhat confusing picture. C-diff studies negative but found to have pseudomembranous colitis on sigmoidoscopy. Biopsies showed active colitis. Pt given 10 days of Dificid upon discharge. Despite taking antibiotics patient started with recurrent diarrhea and started on lomotil prn. Cdiff PCR came back positive and patient started on Vancomycin for 10 days. He has completed 6/10 days.  Will add Florastor 1 capsule twice daily. Will defer procedures to Dr. Rhea Belton. Unfortunately his wife is also starting to have diarrhea and has hx of cdiff, will send kit home to be tested. Reinforced handwashing and using separate restrooms at home. The esophageal dysphagia has resolved s/p dilatation. Currently not on any antiacids.  Plan: -recheck CBC, BMP, Magnesium today -Complete full course of vancomycin 125 mg QID po daily  -continue Lomotil 1 capsule QID before meals and at bedtime -Sent RX Florastor 1 capsule twice daily -defer procedures to Dr Rhea Belton  Thank you for the courtesy of this consult. Please call me with any questions or concerns.   Damiyah Ditmars, FNP-C [facility] 01/21/2024, 9:55 AM  Cc: Chilton Greathouse, MD

## 2024-01-21 NOTE — Patient Instructions (Addendum)
 Continue Vancomycin  Continue Lomotil   Use OTC Florastor 1 cap twice daily  Your provider has requested that you go to the basement level for lab work before leaving today. Press "B" on the elevator. The lab is located at the first door on the left as you exit the elevator.   Due to recent changes in healthcare laws, you may see the results of your imaging and laboratory studies on MyChart before your provider has had a chance to review them.  We understand that in some cases there may be results that are confusing or concerning to you. Not all laboratory results come back in the same time frame and the provider may be waiting for multiple results in order to interpret others.  Please give [place] 48 hours in order for your provider to thoroughly review all the results before contacting the office for clarification of your results.    I appreciate the  opportunity to care for you  Thank You   Deanna May,PA-C

## 2024-01-22 ENCOUNTER — Encounter: Admitting: Internal Medicine

## 2024-01-22 ENCOUNTER — Other Ambulatory Visit

## 2024-01-22 DIAGNOSIS — R197 Diarrhea, unspecified: Secondary | ICD-10-CM | POA: Diagnosis not present

## 2024-01-22 DIAGNOSIS — A0472 Enterocolitis due to Clostridium difficile, not specified as recurrent: Secondary | ICD-10-CM

## 2024-01-22 LAB — CBC WITH DIFFERENTIAL/PLATELET
Basophils Absolute: 0.1 10*3/uL (ref 0.0–0.1)
Basophils Relative: 0.8 % (ref 0.0–3.0)
Eosinophils Absolute: 0.3 10*3/uL (ref 0.0–0.7)
Eosinophils Relative: 4.4 % (ref 0.0–5.0)
HCT: 33.7 % — ABNORMAL LOW (ref 39.0–52.0)
Hemoglobin: 11.2 g/dL — ABNORMAL LOW (ref 13.0–17.0)
Lymphocytes Relative: 20.9 % (ref 12.0–46.0)
Lymphs Abs: 1.6 10*3/uL (ref 0.7–4.0)
MCHC: 33.2 g/dL (ref 30.0–36.0)
MCV: 95.5 fl (ref 78.0–100.0)
Monocytes Absolute: 1 10*3/uL (ref 0.1–1.0)
Monocytes Relative: 13.9 % — ABNORMAL HIGH (ref 3.0–12.0)
Neutro Abs: 4.5 10*3/uL (ref 1.4–7.7)
Neutrophils Relative %: 60 % (ref 43.0–77.0)
Platelets: 273 10*3/uL (ref 150.0–400.0)
RBC: 3.52 Mil/uL — ABNORMAL LOW (ref 4.22–5.81)
RDW: 14.3 % (ref 11.5–15.5)
WBC: 7.5 10*3/uL (ref 4.0–10.5)

## 2024-01-22 LAB — MAGNESIUM: Magnesium: 1.7 mg/dL (ref 1.5–2.5)

## 2024-01-22 LAB — BASIC METABOLIC PANEL WITH GFR
BUN: 22 mg/dL (ref 6–23)
CO2: 25 meq/L (ref 19–32)
Calcium: 9.4 mg/dL (ref 8.4–10.5)
Chloride: 101 meq/L (ref 96–112)
Creatinine, Ser: 1.44 mg/dL (ref 0.40–1.50)
GFR: 43.92 mL/min — ABNORMAL LOW (ref >60.00)
Glucose, Bld: 81 mg/dL (ref 70–99)
Potassium: 3.6 meq/L (ref 3.5–5.1)
Sodium: 136 meq/L (ref 135–145)

## 2024-01-28 ENCOUNTER — Telehealth (HOSPITAL_COMMUNITY): Payer: Self-pay

## 2024-01-28 ENCOUNTER — Ambulatory Visit (HOSPITAL_COMMUNITY)
Admission: RE | Admit: 2024-01-28 | Discharge: 2024-01-28 | Disposition: A | Discharge status: Home / Self Care | Source: Ambulatory Visit | Attending: Internal Medicine | Admitting: Internal Medicine

## 2024-01-28 ENCOUNTER — Other Ambulatory Visit (HOSPITAL_COMMUNITY): Payer: Self-pay

## 2024-01-28 VITALS — BP 118/60 | HR 74 | Wt 135.6 lb

## 2024-01-28 DIAGNOSIS — I440 Atrioventricular block, first degree: Secondary | ICD-10-CM | POA: Diagnosis not present

## 2024-01-28 DIAGNOSIS — I471 Supraventricular tachycardia, unspecified: Secondary | ICD-10-CM | POA: Insufficient documentation

## 2024-01-28 DIAGNOSIS — I130 Hypertensive heart and chronic kidney disease with heart failure and stage 1 through stage 4 chronic kidney disease, or unspecified chronic kidney disease: Secondary | ICD-10-CM | POA: Insufficient documentation

## 2024-01-28 DIAGNOSIS — I480 Paroxysmal atrial fibrillation: Secondary | ICD-10-CM | POA: Insufficient documentation

## 2024-01-28 DIAGNOSIS — G4733 Obstructive sleep apnea (adult) (pediatric): Secondary | ICD-10-CM | POA: Insufficient documentation

## 2024-01-28 DIAGNOSIS — N1832 Chronic kidney disease, stage 3b: Secondary | ICD-10-CM | POA: Insufficient documentation

## 2024-01-28 DIAGNOSIS — I493 Ventricular premature depolarization: Secondary | ICD-10-CM | POA: Diagnosis not present

## 2024-01-28 DIAGNOSIS — Z79899 Other long term (current) drug therapy: Secondary | ICD-10-CM | POA: Diagnosis not present

## 2024-01-28 DIAGNOSIS — I10 Essential (primary) hypertension: Secondary | ICD-10-CM | POA: Diagnosis not present

## 2024-01-28 DIAGNOSIS — Z7901 Long term (current) use of anticoagulants: Secondary | ICD-10-CM | POA: Insufficient documentation

## 2024-01-28 DIAGNOSIS — I5032 Chronic diastolic (congestive) heart failure: Secondary | ICD-10-CM | POA: Insufficient documentation

## 2024-01-28 DIAGNOSIS — I340 Nonrheumatic mitral (valve) insufficiency: Secondary | ICD-10-CM | POA: Diagnosis not present

## 2024-01-28 DIAGNOSIS — G2581 Restless legs syndrome: Secondary | ICD-10-CM | POA: Diagnosis not present

## 2024-01-28 MED ORDER — APIXABAN 2.5 MG PO TABS: 2.5000 mg | ORAL_TABLET | Freq: Two times a day (BID) | ORAL | 6 refills | Status: DC

## 2024-01-28 MED ORDER — APIXABAN 2.5 MG PO TABS: 2.5000 mg | ORAL_TABLET | Freq: Two times a day (BID) | ORAL | Status: AC

## 2024-01-28 NOTE — Progress Notes (Signed)
 Medication Samples have been provided to the patient.  Drug name: Eliquis       Strength: 2.5mg         Qty: 6  LOT: [id number], [id number]  Exp.Date: 10/2024, 12/2024  Dosing instructions: take 1 tab Twice daily   The patient has been instructed regarding the correct time, dose, and frequency of taking this medication, including desired effects and most common side effects.   [name] 3:31 PM 01/28/2024

## 2024-01-28 NOTE — Patient Instructions (Signed)
 Medication Changes:  DECREASE Eliquis to 2.5 mg Twice daily    Follow-Up in: 6 months (October), **PLEASE CALL OUR OFFICE IN AUGUST TO SCHEDULE THIS APPOINTMENT    At the [facility], you and your health needs are our priority. We have a designated team specialized in the treatment of Heart Failure. This Care Team includes your primary Heart Failure Specialized Cardiologist (physician), Advanced Practice Providers (APPs- Physician Assistants and Nurse Practitioners), and Pharmacist who all work together to provide you with the care you need, when you need it.   You may see any of the following providers on your designated Care Team at your next follow up:  Dr. Jules Oar Dr. Peder Bourdon Dr. Alwin Baars Dr. Judyth Nunnery Nieves Bars, NP Ruddy Corral, [place] [facility] [place], [place] Dennise Fitz, NP [place] Lee, NP Luster Salters, PharmD   Please be sure to bring in all your medications bottles to every appointment.   Need to Contact Us :  If you have any questions or concerns before your next appointment please send us  a message through [place] or call our office at [phone].    TO LEAVE A MESSAGE FOR THE NURSE SELECT OPTION 2, PLEASE LEAVE A MESSAGE INCLUDING: YOUR NAME DATE OF BIRTH CALL BACK NUMBER REASON FOR CALL**this is important as we prioritize the call backs  YOU WILL RECEIVE A CALL BACK THE SAME DAY AS LONG AS YOU CALL BEFORE 4:00 PM

## 2024-01-28 NOTE — Telephone Encounter (Signed)
 Advanced [facility] Patient Advocate Encounter  Patient expressed concern regarding cost of Eliquis. Contacted pharmacy to confirm copay; previous refill was over $300 due to deductible however now that deductible has been met this medication has a copay of $47 per month. Informed patient by voicemail.  Kennis Peacock, CPhT Rx Patient Advocate Phone: [phone]

## 2024-01-28 NOTE — Progress Notes (Signed)
 ADVANCED HF CLINIC NOTE  Referring Physician: Chilton Greathouse, MD Primary Care: Chilton Greathouse, MD Primary Cardiologist: Peter Swaziland, MD  HPI:  [name] is a 89 y.o. male who is seen for follow up. I care for his wife [name] and he is Dr [name] father in law. He has a history of HTN and OSA on CPAP. Prior Myoview in 2010 was normal. He had a cardiac cath in 2018 showing normal coronaries and normal right heart hemodynamics. Echo in 2018 showed mild MR-otherwise normal. He is followed by Dr Mayford Knife for OSA. Has restless leg syndrome. Prior arterial dopplers normal.    Developed new onset AF in 2/24  Had DC-CV 12/16/22 lasted only a few days. Loaded Flecainide and converted. Had recurrent AF on Flecainide 50 bid. Now maintaining NSR on 100 bid   In 8.24 had syncope. Brain MRI ok. We placed Zio  1. Sinus rhythm - min HR of 50 bpm, max HR of 126 bpm, and avg HR of 68 bpm.  2. First Degree AV Block was present.  3. Three runs of Supraventricular Tachycardia occurred, the run with the fastest interval lasting 4 beats with a max rate of 126 bpm, the longest lasting 7 beats with an avg rate of 98 bpm.  4. Rare PACs and PVCs 5. AF was not detected.  6. 13 patient-triggered events associated with sinus rhythm  Hospitalized in February for C.diff with pseudomembranous colitis (was previously teated for PNA). Now on oral vanc. Stools improving. Eating ok. No dizzinees. Tolerating Eliquis ok. No bleeding. No taking lasix   TEE 3/24 EF 55-60% mod-sev MR   Has repeat echo 02/17/23 EF 55-60% mild to moderate MR   Past Medical History:  Diagnosis Date   Anemia    Arthritis    osteoarthritis  back   Celiac disease    Chronic kidney disease    Collagenous colitis    Diverticulosis    Enlarged prostate    followed by dr  [name]   Esophageal dysmotility    Esophageal dysmotility    Esophageal stenosis    Essential hypertension, benign 03/29/2014   External hemorrhoids    External  hemorrhoids    Full dentures    GERD (gastroesophageal reflux disease)    Hemorrhoids    Hiatal hernia    IBS (irritable bowel syndrome)    Internal hemorrhoids    OSA on CPAP 10/17/2018   Osteoporosis    Postoperative anemia due to acute blood loss 03/29/2014   Schatzki's ring    Sleep apnea    CPAP Machine   Ulcer     Current Outpatient Medications  Medication Sig Dispense Refill   acetaminophen (TYLENOL) 500 MG tablet Take 1,000 mg by mouth in the morning and at bedtime.     albuterol (PROVENTIL HFA;VENTOLIN HFA) 108 (90 Base) MCG/ACT inhaler Inhale 2 puffs into the lungs every 6 (six) hours as needed for wheezing or shortness of breath.     amLODipine (NORVASC) 5 MG tablet Take 10 mg by mouth daily.     apixaban (ELIQUIS) 5 MG TABS tablet Take 1 tablet (5 mg total) by mouth 2 (two) times daily.     diclofenac Sodium (VOLTAREN) 1 % GEL Apply 2 g topically 4 (four) times daily as needed (pain.).     diphenoxylate-atropine (LOMOTIL) 2.5-0.025 MG tablet Take 1 tablet by mouth 4 (four) times daily as needed for diarrhea or loose stools. 30 tablet 0   doxazosin (CARDURA) 8 MG tablet Take  4 mg by mouth daily.     flecainide (TAMBOCOR) 50 MG tablet Take 2 tablets (100 mg total) by mouth 2 (two) times daily. 180 tablet 0   furosemide (LASIX) 20 MG tablet Take 1 tablet by mouth as needed or directed by the Heart Failure clinic (Patient taking differently: Take 20 mg by mouth daily as needed for edema or fluid.) 15 tablet 11   gabapentin (NEURONTIN) 600 MG tablet Take 600 mg by mouth at bedtime.     loratadine (CLARITIN) 10 MG tablet Take 10 mg by mouth in the morning.     montelukast (SINGULAIR) 10 MG tablet Take 10 mg by mouth in the morning.     potassium chloride SA (KLOR-CON M) 20 MEQ tablet TAKE 1 TABLET EVERY DAY WITH SUPPER (Patient taking differently: Take 20 mEq by mouth every evening.) 90 tablet 1   PRESCRIPTION MEDICATION CPAP- At bedtime     prochlorperazine (COMPAZINE) 5 MG tablet  Take 1 tablet (5 mg total) by mouth every 6 (six) hours as needed for nausea or vomiting. 30 tablet 0   saccharomyces boulardii (FLORASTOR) 250 MG capsule Take 1 capsule (250 mg total) by mouth 2 (two) times daily. 180 capsule 0   SYSTANE HYDRATION PF 0.4-0.3 % SOLN Place 1 drop into both eyes 3 (three) times daily as needed (for dryness).     vancomycin (VANCOCIN) 125 MG capsule Take 1 capsule (125 mg total) by mouth 4 (four) times daily. 40 capsule 0   zolpidem (AMBIEN) 10 MG tablet Take 10 mg by mouth at bedtime.     No current facility-administered medications for this encounter.    Allergies  Allergen Reactions   Gluten Meal Other (See Comments)    Celiac Disease   Other Other (See Comments)    NO SEEDED FOODS!!   Wheat Other (See Comments)    Celiac Disease      Social History   Socioeconomic History   Marital status: Married    Spouse name: Not on file   Number of children: 3   Years of education: Not on file   Highest education level: Not on file  Occupational History   Occupation: retired  Tobacco Use   Smoking status: Former    Current packs/day: 0.00    Types: Cigarettes    Quit date: 08/11/1981    Years since quitting: 42.4   Smokeless tobacco: Never  Vaping Use   Vaping status: Never Used  Substance and Sexual Activity   Alcohol use: Yes    Comment: rarely   Drug use: No   Sexual activity: Not on file  Other Topics Concern   Not on file  Social History Narrative   Not on file   Social Drivers of Health   Financial Resource Strain: Not on file  Food Insecurity: No Food Insecurity (11/29/2023)   Hunger Vital Sign    Worried About Running Out of Food in the Last Year: Never true    Ran Out of Food in the Last Year: Never true  Transportation Needs: No Transportation Needs (11/29/2023)   PRAPARE - Administrator, Civil Service (Medical): No    Lack of Transportation (Non-Medical): No  Physical Activity: Not on file  Stress: Not on file   Social Connections: Socially Integrated (11/28/2023)   Social Connection and Isolation Panel [NHANES]    Frequency of Communication with Friends and Family: Three times a week    Frequency of Social Gatherings with Friends and Family: Twice  a week    Attends Religious Services: 1 to 4 times per year    Active Member of Clubs or Organizations: Yes    Attends Banker Meetings: 1 to 4 times per year    Marital Status: Married  Catering manager Violence: Not At Risk (11/29/2023)   Humiliation, Afraid, Rape, and Kick questionnaire    Fear of Current or Ex-Partner: No    Emotionally Abused: No    Physically Abused: No    Sexually Abused: No      Family History  Problem Relation Age of Onset   Cervical cancer Mother    Colon cancer Mother    Stroke Father    Heart attack Father    Breast cancer Sister    Throat cancer Brother    Liver cancer Brother    Brain cancer Brother    Esophageal cancer Neg Hx    Rectal cancer Neg Hx    Stomach cancer Neg Hx     Vitals:   01/28/24 1413  BP: 118/60  Pulse: 74  SpO2: 93%  Weight: 61.5 kg (135 lb 9.6 oz)     PHYSICAL EXAM: General: Elderly No resp difficulty HEENT: normal Neck: supple. no JVD. Carotids 2+ bilat; no bruits. No lymphadenopathy or thryomegaly appreciated. Cor: PMI nondisplaced. Regular rate & rhythm. No rubs, gallops or murmurs. Lungs: clear Abdomen: soft, nontender, nondistended. No hepatosplenomegaly. No bruits or masses. Good bowel sounds. Extremities: no cyanosis, clubbing, rash, edema Neuro: alert & orientedx3, cranial nerves grossly intact. moves all 4 extremities w/o difficulty. Affect pleasant    ECG: NSR 65 intervals ok Personally reviewed   ASSESSMENT & PLAN:  1. PAF - new-onset 2/24 - now maintaining NSR on flecainide 100 bid.  - Continue Eliquis. With age > 60 Scr 1.44 and weight 61kg will decrease to 2.5 bid - Zio patch 9/24 no recurrence  2. Chronic diastolic HF in setting of AF -  TEE 3/24 EF 55-60% mod-sev MR - much improved with return of NSR  - Volume status ok. No need for lasix - Resolved  3. OSA - continue CPAP  4. CKD 3b - baseline Scr 1.5-1.6 - Stable with recent Scr 1.44 - Consider SGLT2i when recovers fully from c.diff  5. HTN - Blood pressure doing well. We discussed risk of low BP. - Goal SBP 120-140 - will decrease amlodipine to 5  6. Mitral regurgitation - I reviewed TEE. Seems more moderate range - Echo 5/24 EF 55-60% mild to mod MR (mostly mild)    Jules Oar, MD  2:38 PM

## 2024-02-09 ENCOUNTER — Telehealth: Payer: Self-pay | Admitting: Gastroenterology

## 2024-02-09 NOTE — Telephone Encounter (Signed)
 PT is calling to have a refill for lomotil  sent to [facility]. Please advise.

## 2024-02-10 ENCOUNTER — Other Ambulatory Visit: Payer: Self-pay

## 2024-02-10 MED ORDER — DIPHENOXYLATE-ATROPINE 2.5-0.025 MG PO TABS: 1.0000 | ORAL_TABLET | Freq: Four times a day (QID) | ORAL | 1 refills | Status: DC | PRN

## 2024-02-10 NOTE — Telephone Encounter (Signed)
 [facility] called and stated that they have still no received the replacement fax for the patient [name] . Pharmacy is requesting a new fax to be sent over. Please advise.

## 2024-02-10 NOTE — Telephone Encounter (Signed)
 [name] from [facility] needs a new fax with a signature for the Lomotil . She advised a verbal can be done for medication. Call [phone]

## 2024-02-10 NOTE — Telephone Encounter (Signed)
 Refill faxed to pharmacy on 02/10/24

## 2024-02-11 ENCOUNTER — Telehealth: Payer: Self-pay | Admitting: Gastroenterology

## 2024-02-11 ENCOUNTER — Other Ambulatory Visit: Payer: Self-pay

## 2024-02-11 DIAGNOSIS — A0472 Enterocolitis due to Clostridium difficile, not specified as recurrent: Secondary | ICD-10-CM

## 2024-02-11 DIAGNOSIS — R197 Diarrhea, unspecified: Secondary | ICD-10-CM

## 2024-02-11 MED ORDER — DIPHENOXYLATE-ATROPINE 2.5-0.025 MG PO TABS: 1.0000 | ORAL_TABLET | Freq: Four times a day (QID) | ORAL | 1 refills | Status: AC | PRN

## 2024-02-11 NOTE — Telephone Encounter (Signed)
 Talked to patient regarding completing another stool test for c difficile to rule this out as cause of diarrhea. Patient states he will come by tomorrow morning to pick the stool kit up. Order entered in EPIC.

## 2024-02-11 NOTE — Telephone Encounter (Signed)
 Called patient, he states his diarrhea started back about 2 days ago. He states he has not consumed any gluten. He has up to 8-10 loose stools per day. Patient has not been taking anything because he ran out of lomotil . Informed him we refilled lomotil  yesterday, to contact his pharmacy to see if he can pick it up. I will also have Tyra Galley, MA follow-up on this. Denies blood in stool. He states his stomach is rumbling. Will discuss with Dr. Bridgett Camps.

## 2024-02-11 NOTE — Addendum Note (Signed)
 Addended by: Glennette Lanius on: 02/11/2024 02:55 PM   Modules accepted: Orders

## 2024-02-11 NOTE — Telephone Encounter (Signed)
 Inbound call from patients daughter calling stating that patient has been experiencing diarrhea for the last 3 days and he has been up all night for the last 2 nights and he had called for 2 days in a row requesting to speak with someone and has not heard anything from anyone. Patients daughter is requesting that someone reach out to patient to discuss. She also states that he is in need of Lomtil and possibly  Vancomycin  as well. Please advise

## 2024-02-11 NOTE — Telephone Encounter (Signed)
 Resent Rx to pharmacy with correct signature.

## 2024-02-12 ENCOUNTER — Other Ambulatory Visit: Payer: Self-pay | Admitting: Internal Medicine

## 2024-02-12 ENCOUNTER — Other Ambulatory Visit

## 2024-02-12 DIAGNOSIS — R197 Diarrhea, unspecified: Secondary | ICD-10-CM

## 2024-02-12 DIAGNOSIS — A0472 Enterocolitis due to Clostridium difficile, not specified as recurrent: Secondary | ICD-10-CM | POA: Diagnosis not present

## 2024-02-14 NOTE — Progress Notes (Signed)
 Addendum: Reviewed and agree with assessment and management plan. Asha Grumbine, Carie Caddy, MD

## 2024-02-15 LAB — CLOSTRIDIUM DIFFICILE TOXIN B, QUALITATIVE, REAL-TIME PCR: Toxigenic C. Difficile by PCR: DETECTED — AB

## 2024-02-16 ENCOUNTER — Telehealth: Payer: Self-pay

## 2024-02-16 ENCOUNTER — Telehealth: Payer: Self-pay | Admitting: *Deleted

## 2024-02-16 ENCOUNTER — Other Ambulatory Visit: Payer: Self-pay | Admitting: *Deleted

## 2024-02-16 ENCOUNTER — Other Ambulatory Visit (HOSPITAL_COMMUNITY): Payer: Self-pay

## 2024-02-16 MED ORDER — VANCOMYCIN HCL 250 MG PO CAPS: 250.0000 mg | ORAL_CAPSULE | Freq: Four times a day (QID) | ORAL | 0 refills | Status: AC

## 2024-02-16 MED ORDER — VOWST PO CAPS: 4.0000 | ORAL_CAPSULE | Freq: Every day | ORAL | 0 refills | Status: AC

## 2024-02-16 NOTE — Telephone Encounter (Signed)
 Pharmacy Patient Advocate Encounter   Received notification from Pt Calls Messages that prior authorization for Vowst capsules is required/requested.   Insurance verification completed.   The patient is insured through [employer] .   Per test claim: PA required; PA submitted to above mentioned insurance via CoverMyMeds Key/confirmation #/EOC [id number] Status is pending

## 2024-02-16 NOTE — Telephone Encounter (Signed)
 PA Team-  Can we try to get Vowst PA for this patient? Patient already enrolled in [facility] program.  Thank you!

## 2024-02-16 NOTE — Telephone Encounter (Signed)
 Nannette Babe, MD 02/16/2024 10:12 AM EDT Back to Top    1.What is the diagnosis? Recurrent clostridioides difficile infection (CDI) 2.Does the patient have a diagnosis of recurrent clostridioides difficile infection (CDI) as defined by BOTH of the following the presence of diarrhea ( 3 or more loose bowel  movements within 24 hours for 2 consecutive days,  and a positive test for c difficile?  yes 3. Is the medication prescribed by a gastroenterologist?  Yes 4. Does the patient have a hx of 2 or more recurrent episodes of c. Diff within 12 months?  yes 5. Has the patient completed at least 10 consecutive days of ONE of the following antibiotic therapies for 2-4 days prior to initiating the request for Voust: 1. Oral vancomycin ; 2 Dificid     yes 6. Will the patient complete a course of magnesium  citrate 296 ml bottle or polyethylene glycol electrolytes solution, for impaired kidney function,  the day before and at least 8 hours prior to initiating Vowst?   yes 7. Is the previous episode of CDI under control < 3 unformed/loose stools/day for 2 consecutive days?   Not yet; being treated for recurrence now

## 2024-02-16 NOTE — Telephone Encounter (Signed)
 Pharmacy Patient Advocate Encounter  Received notification from [employer] that Prior Authorization for  Vowst capsules has been APPROVED from 10-14-2023 to 10-12-2024. Ran test claim, Copay is $911.11**. This test claim was processed through [facility]- copay amounts may vary at other pharmacies due to pharmacy/plan contracts, or as the patient moves through the different stages of their insurance plan.   PA #/Case ID/Reference #: [id number]    **This will allow patient to meet deductible.  Patient is also likely to be eligible for the Medicare Prescription Payment Plan and will need to contact his insurance for further information

## 2024-02-16 NOTE — Telephone Encounter (Signed)
 PA request has been Submitted. New Encounter has been or will be created for follow up. For additional info see Pharmacy Prior Auth telephone encounter from 02-16-2024.

## 2024-02-23 ENCOUNTER — Telehealth: Payer: Self-pay | Admitting: Gastroenterology

## 2024-02-23 NOTE — Telephone Encounter (Signed)
 Deadra Everts from [employer] called and stated patient has put in an application for their services; however, in order to complete the application, they need the name of the antibiotic he was prescribed.  Please call and advise.  Phone #:  [phone], from 8 a.m.-8 p.m.

## 2024-02-25 NOTE — Telephone Encounter (Signed)
 I contacted [facility]ow[facility]  [employer] and spoke to [place] this morning following fax received on 02/23/24 indicating the need to know what antibiotic patient was previously on as well as needing patient signature for HIPAA consent. I advised that patient has previously tried vancomycin  (as presented in the notes faxed to the Vowst  Support program at original referral). I also asked if patient consent could be obtained by them over the phone. Bobbe Burner says that they should be able to get consent from patient over the phone but she will double check to see if it is really even needed. She says their program should reach out to the patient over the next couple of days with additional steps.

## 2024-02-26 NOTE — Telephone Encounter (Signed)
 Spoke to Leanna [facility] At [facility] program who states that they have patient's information and insurance approval. They should begin "triage" process soon and the case worker should reach out to our office with progress update.

## 2024-02-29 ENCOUNTER — Other Ambulatory Visit: Payer: Self-pay

## 2024-02-29 NOTE — Telephone Encounter (Signed)
 This is a CHF pt

## 2024-03-01 MED ORDER — FLECAINIDE ACETATE 50 MG PO TABS: 100.0000 mg | ORAL_TABLET | Freq: Two times a day (BID) | ORAL | 0 refills | Status: DC

## 2024-03-04 NOTE — Telephone Encounter (Signed)
 Per Vowst  [employer] representative, patient's Vowst  medication kit was shipped to patient today, 03/04/24.

## 2024-03-10 NOTE — Telephone Encounter (Signed)
 I have spoken to patient and asked that he call [facility] back to initiate patient financial assistance as they have previously been unable to reach him. Patient says he will call them as soon as we get off the phone. Patient says he has no more diarrhea since taking the Dificid .

## 2024-03-10 NOTE — Telephone Encounter (Signed)
 Inbound call from Crystal at [facility] stating they have been trying to reach patient and has left multiple voicemail's to discuss financial assistance for Vowst . Requesting to know if we have been able to reach patient regarding medication. If so, would like us  to inform patient to give [name] a call back. Also wanted to insure that patient has enough medication as of now. Call back number is [phone]. Please advise, thank you.

## 2024-03-14 NOTE — Telephone Encounter (Signed)
 Inbound call from patient's wife stating that patient is no longer having diarrhea and she he still proceed with trying to receive medication. Also stated that they were suppose to receive a form through email but has not received anything. Wife is requesting a call back. Please advise, thank you.

## 2024-03-14 NOTE — Telephone Encounter (Signed)
 Spoke to Ms. Lundin who says [facility] called to tell them that if they would fill out paperwork, they could get Vowst  for free. She says they have not received this paperwork and she wants to know if patient even needs Vowst  any longer since he is no longer having diarrhea. Advised that we DO want him to take Vowst  to prevent recurrent infection. I have asked that she reach back out to [facility] in order to get the appropriate paperwork sent to her to complete. She verbalizes understanding.

## 2024-03-23 DIAGNOSIS — G4733 Obstructive sleep apnea (adult) (pediatric): Secondary | ICD-10-CM | POA: Diagnosis not present

## 2024-03-23 DIAGNOSIS — J392 Other diseases of pharynx: Secondary | ICD-10-CM | POA: Diagnosis not present

## 2024-03-23 DIAGNOSIS — J309 Allergic rhinitis, unspecified: Secondary | ICD-10-CM | POA: Diagnosis not present

## 2024-03-23 DIAGNOSIS — R058 Other specified cough: Secondary | ICD-10-CM | POA: Diagnosis not present

## 2024-03-23 DIAGNOSIS — R531 Weakness: Secondary | ICD-10-CM | POA: Diagnosis not present

## 2024-03-23 DIAGNOSIS — J209 Acute bronchitis, unspecified: Secondary | ICD-10-CM | POA: Diagnosis not present

## 2024-03-23 DIAGNOSIS — R0981 Nasal congestion: Secondary | ICD-10-CM | POA: Diagnosis not present

## 2024-03-23 DIAGNOSIS — Z1152 Encounter for screening for COVID-19: Secondary | ICD-10-CM | POA: Diagnosis not present

## 2024-03-28 DIAGNOSIS — A0472 Enterocolitis due to Clostridium difficile, not specified as recurrent: Secondary | ICD-10-CM | POA: Diagnosis not present

## 2024-03-28 DIAGNOSIS — J189 Pneumonia, unspecified organism: Secondary | ICD-10-CM | POA: Diagnosis not present

## 2024-03-28 DIAGNOSIS — R058 Other specified cough: Secondary | ICD-10-CM | POA: Diagnosis not present

## 2024-03-28 DIAGNOSIS — N1831 Chronic kidney disease, stage 3a: Secondary | ICD-10-CM | POA: Diagnosis not present

## 2024-04-06 ENCOUNTER — Other Ambulatory Visit: Payer: Self-pay | Admitting: Internal Medicine

## 2024-04-11 DIAGNOSIS — J189 Pneumonia, unspecified organism: Secondary | ICD-10-CM | POA: Diagnosis not present

## 2024-04-12 ENCOUNTER — Other Ambulatory Visit: Payer: Self-pay | Admitting: *Deleted

## 2024-04-12 MED ORDER — FLECAINIDE ACETATE 50 MG PO TABS: 100.0000 mg | ORAL_TABLET | Freq: Two times a day (BID) | ORAL | 0 refills | Status: DC

## 2024-04-14 ENCOUNTER — Telehealth: Payer: Self-pay | Admitting: Gastroenterology

## 2024-04-14 NOTE — Telephone Encounter (Signed)
 Spoke with patient's wife regarding symptoms. She stated diarrhea started again 4-5 days ago, 3-5/day. Denies any pain. Taking lomotil  as prescribed. Hydrating well. Pt was approved for VOWST , but has not heard back from the pharmacy regarding shipping. Provided pharmacy number to wife & advised she call and discuss further, and to let us  know further if any issues.

## 2024-04-14 NOTE — Telephone Encounter (Signed)
 Patient wife called and stated that her husband is still having diarrhea and similar symptoms of C-diff which he has had before. Patient wife is requesting a call back at [phone]. Please advise.

## 2024-05-29 ENCOUNTER — Other Ambulatory Visit: Payer: Self-pay | Admitting: Internal Medicine

## 2024-06-09 ENCOUNTER — Other Ambulatory Visit: Payer: Self-pay | Admitting: Internal Medicine

## 2024-06-20 DIAGNOSIS — D649 Anemia, unspecified: Secondary | ICD-10-CM | POA: Diagnosis not present

## 2024-06-20 DIAGNOSIS — G4700 Insomnia, unspecified: Secondary | ICD-10-CM | POA: Diagnosis not present

## 2024-06-20 DIAGNOSIS — I4891 Unspecified atrial fibrillation: Secondary | ICD-10-CM | POA: Diagnosis not present

## 2024-06-20 DIAGNOSIS — Z23 Encounter for immunization: Secondary | ICD-10-CM | POA: Diagnosis not present

## 2024-06-20 DIAGNOSIS — I872 Venous insufficiency (chronic) (peripheral): Secondary | ICD-10-CM | POA: Diagnosis not present

## 2024-06-20 DIAGNOSIS — N1831 Chronic kidney disease, stage 3a: Secondary | ICD-10-CM | POA: Diagnosis not present

## 2024-06-20 DIAGNOSIS — M1990 Unspecified osteoarthritis, unspecified site: Secondary | ICD-10-CM | POA: Diagnosis not present

## 2024-06-20 DIAGNOSIS — G8929 Other chronic pain: Secondary | ICD-10-CM | POA: Diagnosis not present

## 2024-06-20 DIAGNOSIS — I129 Hypertensive chronic kidney disease with stage 1 through stage 4 chronic kidney disease, or unspecified chronic kidney disease: Secondary | ICD-10-CM | POA: Diagnosis not present

## 2024-06-28 DIAGNOSIS — H11823 Conjunctivochalasis, bilateral: Secondary | ICD-10-CM | POA: Diagnosis not present

## 2024-06-28 DIAGNOSIS — H0100B Unspecified blepharitis left eye, upper and lower eyelids: Secondary | ICD-10-CM | POA: Diagnosis not present

## 2024-06-28 DIAGNOSIS — H04223 Epiphora due to insufficient drainage, bilateral lacrimal glands: Secondary | ICD-10-CM | POA: Diagnosis not present

## 2024-06-28 DIAGNOSIS — H0100A Unspecified blepharitis right eye, upper and lower eyelids: Secondary | ICD-10-CM | POA: Diagnosis not present

## 2024-06-30 DIAGNOSIS — R972 Elevated prostate specific antigen [PSA]: Secondary | ICD-10-CM | POA: Diagnosis not present

## 2024-07-07 DIAGNOSIS — N401 Enlarged prostate with lower urinary tract symptoms: Secondary | ICD-10-CM | POA: Diagnosis not present

## 2024-07-07 DIAGNOSIS — R972 Elevated prostate specific antigen [PSA]: Secondary | ICD-10-CM | POA: Diagnosis not present

## 2024-07-07 DIAGNOSIS — R351 Nocturia: Secondary | ICD-10-CM | POA: Diagnosis not present

## 2024-07-15 ENCOUNTER — Other Ambulatory Visit: Payer: Self-pay | Admitting: Internal Medicine

## 2024-07-27 DIAGNOSIS — R058 Other specified cough: Secondary | ICD-10-CM | POA: Diagnosis not present

## 2024-07-27 DIAGNOSIS — J309 Allergic rhinitis, unspecified: Secondary | ICD-10-CM | POA: Diagnosis not present

## 2024-07-27 DIAGNOSIS — N1831 Chronic kidney disease, stage 3a: Secondary | ICD-10-CM | POA: Diagnosis not present

## 2024-07-27 DIAGNOSIS — J209 Acute bronchitis, unspecified: Secondary | ICD-10-CM | POA: Diagnosis not present

## 2024-07-27 DIAGNOSIS — R0989 Other specified symptoms and signs involving the circulatory and respiratory systems: Secondary | ICD-10-CM | POA: Diagnosis not present

## 2024-07-27 DIAGNOSIS — R5383 Other fatigue: Secondary | ICD-10-CM | POA: Diagnosis not present

## 2024-07-27 DIAGNOSIS — Z1152 Encounter for screening for COVID-19: Secondary | ICD-10-CM | POA: Diagnosis not present

## 2024-08-01 DIAGNOSIS — D485 Neoplasm of uncertain behavior of skin: Secondary | ICD-10-CM | POA: Diagnosis not present

## 2024-08-01 DIAGNOSIS — Z85828 Personal history of other malignant neoplasm of skin: Secondary | ICD-10-CM | POA: Diagnosis not present

## 2024-08-01 DIAGNOSIS — L821 Other seborrheic keratosis: Secondary | ICD-10-CM | POA: Diagnosis not present

## 2024-08-01 DIAGNOSIS — L820 Inflamed seborrheic keratosis: Secondary | ICD-10-CM | POA: Diagnosis not present

## 2024-08-01 DIAGNOSIS — L570 Actinic keratosis: Secondary | ICD-10-CM | POA: Diagnosis not present

## 2024-08-01 DIAGNOSIS — D045 Carcinoma in situ of skin of trunk: Secondary | ICD-10-CM | POA: Diagnosis not present

## 2024-08-01 DIAGNOSIS — C4442 Squamous cell carcinoma of skin of scalp and neck: Secondary | ICD-10-CM | POA: Diagnosis not present

## 2024-09-06 ENCOUNTER — Other Ambulatory Visit: Payer: Self-pay | Admitting: Internal Medicine

## 2024-09-14 NOTE — Progress Notes (Signed)
 ADVANCED HF CLINIC NOTE  Primary Care: Janey Santos, MD Primary Cardiologist: Peter Jordan, MD HF Cardiologist: Dr. Cherrie  HPI: [name] is an 89 y.o. male who is seen for follow up. Dr. Cherrie cares for his wife Apolinar, and he is Dr Jeralyn father in law. He has a history of HTN and OSA on CPAP. Prior Myoview  in 2010 was normal. He had a cardiac cath in 2018 showing normal coronaries and normal right heart hemodynamics. Echo in 2018 showed mild MR-otherwise normal. He is followed by Dr Shlomo for OSA. Has restless leg syndrome. Prior arterial dopplers normal.    Developed new onset AF in 2/24. Had DC-CV 12/16/22 lasted only a few days. Loaded Flecainide  and converted. Had recurrent AF on Flecainide  50 bid. Now maintaining NSR on 100 bid   In 8.24 had syncope. Brain MRI ok. We placed Zio  1. Sinus rhythm - min HR of 50 bpm, max HR of 126 bpm, and avg HR of 68 bpm.  2. First Degree AV Block was present.  3. Three runs of Supraventricular Tachycardia occurred, the run with the fastest interval lasting 4 beats with a max rate of 126 bpm, the longest lasting 7 beats with an avg rate of 98 bpm.  4. Rare PACs and PVCs 5. AF was not detected.  6. 13 patient-triggered events associated with sinus rhythm  TEE 3/24 EF 55-60% mod-sev MR   Echo 02/17/23 EF 55-60% mild to moderate MR   Hospitalized in 2/25 for C.diff with pseudomembranous colitis (was previously teated for PNA).   Today he returns for an acute visit with complaints of worsening LEE & SOB, with his daughter. Had gained about 10 lbs over last several weeks, started Lasix  20 for past 2 days and lost 9 lbs. He has SOB walking on flat ground. Abdomen is full. Occasional positional dizziness, no falls or syncope. Symptoms are improved but not totally gone. Denies palpitations, abnormal bleeding, CP, dizziness, or PND/Orthopnea. Appetite ok. Weight at home 150>>142 lbs. Taking all medications. No further GI illness or Crohns  flares.   Past Medical History:  Diagnosis Date   Anemia    Arthritis    osteoarthritis  back   Celiac disease    Chronic kidney disease    Collagenous colitis    Diverticulosis    Enlarged prostate    followed by dr  gerold   Esophageal dysmotility    Esophageal dysmotility    Esophageal stenosis    Essential hypertension, benign 03/29/2014   External hemorrhoids    External hemorrhoids    Full dentures    GERD (gastroesophageal reflux disease)    Hemorrhoids    Hiatal hernia    IBS (irritable bowel syndrome)    Internal hemorrhoids    OSA on CPAP 10/17/2018   Osteoporosis    Postoperative anemia due to acute blood loss 03/29/2014   Schatzki's ring    Sleep apnea    CPAP Machine   Ulcer    Current Outpatient Medications  Medication Sig Dispense Refill   acetaminophen  (TYLENOL ) 500 MG tablet Take 1,000 mg by mouth in the morning and at bedtime.     albuterol  (PROVENTIL  HFA;VENTOLIN  HFA) 108 (90 Base) MCG/ACT inhaler Inhale 2 puffs into the lungs every 6 (six) hours as needed for wheezing or shortness of breath.     amLODipine  (NORVASC ) 5 MG tablet Take 10 mg by mouth daily.     apixaban  (ELIQUIS ) 2.5 MG TABS tablet Take 1 tablet (2.5  mg total) by mouth 2 (two) times daily.     diclofenac Sodium (VOLTAREN) 1 % GEL Apply 2 g topically 4 (four) times daily as needed (pain.).     diphenoxylate -atropine  (LOMOTIL ) 2.5-0.025 MG tablet Take 1 tablet by mouth 4 (four) times daily as needed for diarrhea or loose stools. 30 tablet 1   doxazosin  (CARDURA ) 8 MG tablet Take 4 mg by mouth daily.     famotidine  (PEPCID ) 40 MG tablet TAKE 1 TABLET BY MOUTH 2 TIMES A DAY 180 tablet 0   flecainide  (TAMBOCOR ) 50 MG tablet Take 2 tablets (100 mg total) by mouth 2 (two) times daily. PLEASE SCHEDULE APPOINTMENT FOR MORE REFILLS 180 tablet 1   furosemide  (LASIX ) 20 MG tablet Take 1 tablet by mouth as needed or directed by the Heart Failure clinic 15 tablet 11   gabapentin  (NEURONTIN ) 600 MG  tablet Take 600 mg by mouth at bedtime.     loratadine  (CLARITIN ) 10 MG tablet Take 10 mg by mouth in the morning.     montelukast  (SINGULAIR ) 10 MG tablet Take 10 mg by mouth in the morning.     potassium chloride  SA (KLOR-CON  M) 20 MEQ tablet TAKE 1 TABLET EVERY DAY WITH SUPPER 90 tablet 3   PRESCRIPTION MEDICATION CPAP- At bedtime     prochlorperazine  (COMPAZINE ) 5 MG tablet Take 1 tablet (5 mg total) by mouth every 6 (six) hours as needed for nausea or vomiting. 30 tablet 0   saccharomyces boulardii (FLORASTOR) 250 MG capsule Take 1 capsule (250 mg total) by mouth 2 (two) times daily. 180 capsule 0   SYSTANE HYDRATION PF 0.4-0.3 % SOLN Place 1 drop into both eyes 3 (three) times daily as needed (for dryness).     zolpidem  (AMBIEN ) 10 MG tablet Take 10 mg by mouth at bedtime.     No current facility-administered medications for this encounter.   Allergies  Allergen Reactions   Gluten Meal Other (See Comments)    Celiac Disease   Other Other (See Comments)    NO SEEDED FOODS!!   Wheat Other (See Comments)    Celiac Disease   Social History   Socioeconomic History   Marital status: Married    Spouse name: Not on file   Number of children: 3   Years of education: Not on file   Highest education level: Not on file  Occupational History   Occupation: retired  Tobacco Use   Smoking status: Former    Current packs/day: 0.00    Types: Cigarettes    Quit date: 08/11/1981    Years since quitting: 43.1   Smokeless tobacco: Never  Vaping Use   Vaping status: Never Used  Substance and Sexual Activity   Alcohol  use: Yes    Comment: rarely   Drug use: No   Sexual activity: Not on file  Other Topics Concern   Not on file  Social History Narrative   Not on file   Social Drivers of Health   Financial Resource Strain: Not on file  Food Insecurity: No Food Insecurity (11/29/2023)   Hunger Vital Sign    Worried About Running Out of Food in the Last Year: Never true    Ran Out of  Food in the Last Year: Never true  Transportation Needs: No Transportation Needs (11/29/2023)   PRAPARE - Administrator, Civil Service (Medical): No    Lack of Transportation (Non-Medical): No  Physical Activity: Not on file  Stress: Not on file  Social Connections: Socially Integrated (11/28/2023)   Social Connection and Isolation Panel    Frequency of Communication with Friends and Family: Three times a week    Frequency of Social Gatherings with Friends and Family: Twice a week    Attends Religious Services: 1 to 4 times per year    Active Member of [employer] or Organizations: Yes    Attends Banker Meetings: 1 to 4 times per year    Marital Status: Married  Catering Manager Violence: Not At Risk (11/29/2023)   Humiliation, Afraid, Rape, and Kick questionnaire    Fear of Current or Ex-Partner: No    Emotionally Abused: No    Physically Abused: No    Sexually Abused: No   Family History  Problem Relation Age of Onset   Cervical cancer Mother    Colon cancer Mother    Stroke Father    Heart attack Father    Breast cancer Sister    Throat cancer Brother    Liver cancer Brother    Brain cancer Brother    Esophageal cancer Neg Hx    Rectal cancer Neg Hx    Stomach cancer Neg Hx    Wt Readings from Last 3 Encounters:  09/16/24 68.3 kg (150 lb 9.6 oz)  01/28/24 61.5 kg (135 lb 9.6 oz)  01/21/24 63 kg (139 lb)   BP 136/62   Pulse 66   Wt 68.3 kg (150 lb 9.6 oz)   SpO2 94%   BMI 23.59 kg/m   PHYSICAL EXAM: General:  NAD. No resp difficulty, walked into clinic, elderly HEENT: Normal Neck: Supple. JVP 10 Cor: Regular rate & rhythm. No rubs, gallops or murmurs. Lungs: Clear Abdomen: + distended, + umbilical hernia, nontender   Extremities: No cyanosis, clubbing, rash, trace ankle edema Neuro: Alert & oriented x 3, moves all 4 extremities w/o difficulty. Affect pleasant.  ECG (personally reviewed): NSR 1AVB 64 bpm   ReDs reading: 32%,  normal  ASSESSMENT & PLAN:  1. Chronic diastolic HF in setting of AF - TEE 3/24 EF 55-60% mod-sev MR - Improved with return of NSR  - NYHA IIb today, volume improved but still up a bit, REDs 32%, R>L HF - had brisk diuresis with low dose lasix  so will start low-dose Jardiance  5 mg daily, want to avoid volume depletion. Discussed rationale and potential SE profile.  - Change Lasix  20 mg back to PRN, take 20 KCL when taking Lasix  - Update echo to ensure EF and MR stable - Labs today  2. PAF - new-onset 2/24 - Zio patch 9/24 no recurrence - NSR on ECG today - Continue flecainide  100 mg bid.  - Continue Eliquis  2.5 mg bid. (With age > 80, Scr 1.44 and weight 61kg) - CBC today  3. OSA - Continue CPAP - No change  4. CKD 3b - baseline Scr 1.5-1.6 - Recent Scr 1.44 (01/22/24) - Recovered from CDiff, will start Jardiance  as above - Labs today.   5. HTN - BP well controlled - Avoid hypotension - Goal SBP 120-140 - Continue amlodipine  + dozazosin  6. Mitral regurgitation - Dr. Cherrie reviewed TEE. Seems more moderate range - Echo 5/24 EF 55-60% mild to mod MR (mostly mild)  - No murmur on exam, repeat echo as above to ensure MR stable.  Update echo. Follow up in 6 months with Dr. Bensimhon.  Charles CHRISTELLA Gainer, FNP  11:52 AM

## 2024-09-15 ENCOUNTER — Telehealth (HOSPITAL_COMMUNITY): Payer: Self-pay

## 2024-09-15 NOTE — Telephone Encounter (Signed)
 Called to confirm/remind patient of their appointment at the [facility] on 11/07/23.   Appointment:   [x] Confirmed  [] Left mess   [] No answer/No voice mail  [] VM Full/unable to leave message  [] Phone not in service  Patient reminded to bring all medications and/or complete list.  Confirmed patient has transportation. Gave directions, instructed to utilize valet parking.

## 2024-09-16 ENCOUNTER — Other Ambulatory Visit (HOSPITAL_COMMUNITY): Payer: Self-pay

## 2024-09-16 ENCOUNTER — Ambulatory Visit (HOSPITAL_COMMUNITY): Admission: RE | Admit: 2024-09-16 | Discharge: 2024-09-16 | Attending: Family Medicine

## 2024-09-16 ENCOUNTER — Ambulatory Visit (HOSPITAL_COMMUNITY): Payer: Self-pay | Admitting: Family Medicine

## 2024-09-16 ENCOUNTER — Encounter (HOSPITAL_COMMUNITY): Payer: Self-pay

## 2024-09-16 ENCOUNTER — Telehealth (HOSPITAL_COMMUNITY): Payer: Self-pay

## 2024-09-16 VITALS — BP 136/62 | HR 66 | Wt 150.6 lb

## 2024-09-16 DIAGNOSIS — I10 Essential (primary) hypertension: Secondary | ICD-10-CM

## 2024-09-16 DIAGNOSIS — I340 Nonrheumatic mitral (valve) insufficiency: Secondary | ICD-10-CM

## 2024-09-16 DIAGNOSIS — Z79899 Other long term (current) drug therapy: Secondary | ICD-10-CM | POA: Diagnosis not present

## 2024-09-16 DIAGNOSIS — I110 Hypertensive heart disease with heart failure: Secondary | ICD-10-CM | POA: Diagnosis not present

## 2024-09-16 DIAGNOSIS — I480 Paroxysmal atrial fibrillation: Secondary | ICD-10-CM | POA: Diagnosis not present

## 2024-09-16 DIAGNOSIS — G4733 Obstructive sleep apnea (adult) (pediatric): Secondary | ICD-10-CM | POA: Diagnosis not present

## 2024-09-16 DIAGNOSIS — Z7901 Long term (current) use of anticoagulants: Secondary | ICD-10-CM | POA: Diagnosis not present

## 2024-09-16 DIAGNOSIS — G2581 Restless legs syndrome: Secondary | ICD-10-CM | POA: Diagnosis not present

## 2024-09-16 DIAGNOSIS — N183 Chronic kidney disease, stage 3 unspecified: Secondary | ICD-10-CM | POA: Diagnosis not present

## 2024-09-16 DIAGNOSIS — I5032 Chronic diastolic (congestive) heart failure: Secondary | ICD-10-CM

## 2024-09-16 LAB — CBC
HCT: 35.6 % — ABNORMAL LOW (ref 39.0–52.0)
Hemoglobin: 11.4 g/dL — ABNORMAL LOW (ref 13.0–17.0)
MCH: 30.6 pg (ref 26.0–34.0)
MCHC: 32 g/dL (ref 30.0–36.0)
MCV: 95.7 fL (ref 80.0–100.0)
Platelets: 171 K/uL (ref 150–400)
RBC: 3.72 MIL/uL — ABNORMAL LOW (ref 4.22–5.81)
RDW: 14.1 % (ref 11.5–15.5)
WBC: 3.7 K/uL — ABNORMAL LOW (ref 4.0–10.5)
nRBC: 0 % (ref 0.0–0.2)

## 2024-09-16 LAB — BASIC METABOLIC PANEL WITH GFR
Anion gap: 6 (ref 5–15)
BUN: 20 mg/dL (ref 8–23)
CO2: 30 mmol/L (ref 22–32)
Calcium: 8.6 mg/dL — ABNORMAL LOW (ref 8.9–10.3)
Chloride: 102 mmol/L (ref 98–111)
Creatinine, Ser: 1.86 mg/dL — ABNORMAL HIGH (ref 0.61–1.24)
GFR, Estimated: 35 mL/min — ABNORMAL LOW (ref >60)
Glucose, Bld: 95 mg/dL (ref 70–99)
Potassium: 4.2 mmol/L (ref 3.5–5.1)
Sodium: 138 mmol/L (ref 135–145)

## 2024-09-16 LAB — BRAIN NATRIURETIC PEPTIDE: B Natriuretic Peptide: 85.6 pg/mL (ref 0.0–100.0)

## 2024-09-16 MED ORDER — EMPAGLIFLOZIN 10 MG PO TABS: 10.0000 mg | ORAL_TABLET | Freq: Every day | ORAL | 5 refills | Status: AC

## 2024-09-16 NOTE — Patient Instructions (Addendum)
 Good to see you today!   START Jardiance  5 mg ( 1/2 tablet)daily  KEEP lasix  20 mg with potassium 20 meq as needed  Your physician has requested that you have an echocardiogram. Echocardiography is a painless test that uses sound waves to create images of your heart. It provides your doctor with information about the size and shape of your heart and how well your heart's chambers and valves are working. This procedure takes approximately one hour. There are no restrictions for this procedure. Please do NOT wear cologne, perfume, aftershave, or lotions (deodorant is allowed). Please arrive 15 minutes prior to your appointment time.  Please note: We ask at that you not bring children with you during ultrasound (echo/ vascular) testing. Due to room size and safety concerns, children are not allowed in the ultrasound rooms during exams. Our front office staff cannot provide observation of children in our lobby area while testing is being conducted. An adult accompanying a patient to their appointment will only be allowed in the ultrasound room at the discretion of the ultrasound technician under special circumstances. We apologize for any inconvenience.  Labs done today, your results will be available in MyChart, we will contact you for abnormal readings.  Your physician recommends that you schedule a follow-up appointment 6 months( June) Call office in April  to schedule an appointment  If you have any questions or concerns before your next appointment please send us  a message through [place] or call our office at [phone].    TO LEAVE A MESSAGE FOR THE NURSE SELECT OPTION 2, PLEASE LEAVE A MESSAGE INCLUDING: YOUR NAME DATE OF BIRTH CALL BACK NUMBER REASON FOR CALL**this is important as we prioritize the call backs  YOU WILL RECEIVE A CALL BACK THE SAME DAY AS LONG AS YOU CALL BEFORE 4:00 PM At the [facility], you and your health needs are our priority. As part of our  continuing mission to provide you with exceptional heart care, we have created designated Provider Care Teams. These Care Teams include your primary Cardiologist (physician) and Advanced Practice Providers (APPs- Physician Assistants and Nurse Practitioners) who all work together to provide you with the care you need, when you need it.   You may see any of the following providers on your designated Care Team at your next follow up: Dr Toribio Fuel Dr Ezra Shuck Dr. Morene Brownie Greig Mosses, NP Caffie Shed, [place] [facility] [place], [place] Beckey Coe, NP Jordan Lee, NP Ellouise Class, NP Tinnie Redman, PharmD Jaun Bash, PharmD   Please be sure to bring in all your medications bottles to every appointment.    Thank you for choosing [facility]

## 2024-09-16 NOTE — Telephone Encounter (Signed)
 Advanced Heart Failure Patient Advocate Encounter  Test billing for this patient's current coverage ([employer]) returns a $20.69 copay for 90 day supply of Jardiance .  This test claim was processed through [facility]- copay amounts may vary at other pharmacies due to pharmacy/plan contracts, or as the patient moves through the different stages of their insurance plan.  Rachel DEL, CPhT Rx Patient Advocate Phone: [phone]

## 2024-09-16 NOTE — Progress Notes (Signed)
 ReDS Vest / Clip - 09/16/24 1100       ReDS Vest / Clip   Station Marker C    Ruler Value 25.5    ReDS Value Range Low volume    ReDS Actual Value 32

## 2024-09-19 ENCOUNTER — Telehealth (HOSPITAL_COMMUNITY): Payer: Self-pay | Admitting: *Deleted

## 2024-09-19 DIAGNOSIS — I5032 Chronic diastolic (congestive) heart failure: Secondary | ICD-10-CM

## 2024-09-19 NOTE — Telephone Encounter (Signed)
 Called patient per Harlene Solon, NP with following:  Mild AKI after Lasix . We changed to PRN. Would wait a couple days before starting Jardiance  (OK to start on Sunday 12/7). Repeat BMET in 2 weeks  Pt verbalized understanding of same. He started the Jardiance  09/17/24. He has been taking Lasix  only as needed for stomach swelling and weight gain of 4 lbs.   We will re-check labs on scheduled date of his echo. Pt verbalized understanding of same.

## 2024-09-20 DIAGNOSIS — H43813 Vitreous degeneration, bilateral: Secondary | ICD-10-CM | POA: Diagnosis not present

## 2024-09-20 DIAGNOSIS — H11823 Conjunctivochalasis, bilateral: Secondary | ICD-10-CM | POA: Diagnosis not present

## 2024-09-20 DIAGNOSIS — H0100B Unspecified blepharitis left eye, upper and lower eyelids: Secondary | ICD-10-CM | POA: Diagnosis not present

## 2024-09-20 DIAGNOSIS — H0100A Unspecified blepharitis right eye, upper and lower eyelids: Secondary | ICD-10-CM | POA: Diagnosis not present

## 2024-09-20 DIAGNOSIS — H04123 Dry eye syndrome of bilateral lacrimal glands: Secondary | ICD-10-CM | POA: Diagnosis not present

## 2024-09-20 DIAGNOSIS — H52203 Unspecified astigmatism, bilateral: Secondary | ICD-10-CM | POA: Diagnosis not present

## 2024-09-20 DIAGNOSIS — H524 Presbyopia: Secondary | ICD-10-CM | POA: Diagnosis not present

## 2024-10-17 ENCOUNTER — Ambulatory Visit (HOSPITAL_COMMUNITY): Admission: RE | Admit: 2024-10-17 | Source: Ambulatory Visit

## 2024-11-01 ENCOUNTER — Ambulatory Visit (HOSPITAL_COMMUNITY)
Admission: RE | Admit: 2024-11-01 | Discharge: 2024-11-01 | Disposition: A | Source: Ambulatory Visit | Attending: Family Medicine | Admitting: Family Medicine

## 2024-11-01 DIAGNOSIS — I371 Nonrheumatic pulmonary valve insufficiency: Secondary | ICD-10-CM | POA: Insufficient documentation

## 2024-11-01 DIAGNOSIS — I340 Nonrheumatic mitral (valve) insufficiency: Secondary | ICD-10-CM | POA: Diagnosis not present

## 2024-11-01 DIAGNOSIS — I517 Cardiomegaly: Secondary | ICD-10-CM | POA: Insufficient documentation

## 2024-11-01 DIAGNOSIS — I5032 Chronic diastolic (congestive) heart failure: Secondary | ICD-10-CM | POA: Insufficient documentation

## 2024-11-01 LAB — ECHOCARDIOGRAM COMPLETE
Area-P 1/2: 4.21 cm2
Calc EF: 62.9 %
S' Lateral: 2.6 cm
Single Plane A2C EF: 58.8 %
Single Plane A4C EF: 66.9 %

## 2024-11-08 ENCOUNTER — Telehealth (HOSPITAL_COMMUNITY): Payer: Self-pay

## 2024-11-08 NOTE — Telephone Encounter (Signed)
 Called to confirm/remind patient of their appointment at the [facility] on 11/09/24.   Appointment:   [] Confirmed  [x] Left mess   [] No answer/No voice mail  [] VM Full/unable to leave message  [] Phone not in service  And to bring in all medications and/or complete list.

## 2024-11-08 NOTE — Progress Notes (Incomplete)
 "  ADVANCED HF CLINIC NOTE  Primary Care: Janey Santos, MD Primary Cardiologist: Peter Jordan, MD HF Cardiologist: Dr. Cherrie  HPI: [name] is an 89 y.o. male who is seen for follow up. Dr. Cherrie cares for his wife Apolinar, and he is Dr Jeralyn father in law. He has a history of HTN and OSA on CPAP. Prior Myoview  in 2010 was normal. He had a cardiac cath in 2018 showing normal coronaries and normal right heart hemodynamics. Echo in 2018 showed mild MR-otherwise normal. He is followed by Dr Shlomo for OSA. Has restless leg syndrome. Prior arterial dopplers normal.    Developed new onset AF in 2/24. Had DC-CV 12/16/22 lasted only a few days. Loaded Flecainide  and converted. Had recurrent AF on Flecainide  50 bid. Now maintaining NSR on 100 bid   In 8.24 had syncope. Brain MRI ok. We placed Zio  1. Sinus rhythm - min HR of 50 bpm, max HR of 126 bpm, and avg HR of 68 bpm.  2. First Degree AV Block was present.  3. Three runs of Supraventricular Tachycardia occurred, the run with the fastest interval lasting 4 beats with a max rate of 126 bpm, the longest lasting 7 beats with an avg rate of 98 bpm.  4. Rare PACs and PVCs 5. AF was not detected.  6. 13 patient-triggered events associated with sinus rhythm  TEE 3/24 EF 55-60% mod-sev MR   Echo 02/17/23 EF 55-60% mild to moderate MR   Hospitalized in 2/25 for C.diff with pseudomembranous colitis (was previously teated for PNA).   Today he returns for an acute visit with complaints of worsening LEE & SOB, with his daughter. Had gained about 10 lbs over last several weeks, started Lasix  20 for past 2 days and lost 9 lbs. He has SOB walking on flat ground. Abdomen is full. Occasional positional dizziness, no falls or syncope. Symptoms are improved but not totally gone. Denies palpitations, abnormal bleeding, CP, dizziness, or PND/Orthopnea. Appetite ok. Weight at home 150>>142 lbs. Taking all medications. No further GI illness or Crohns  flares.   Past Medical History:  Diagnosis Date   Anemia    Arthritis    osteoarthritis  back   Celiac disease    Chronic kidney disease    Collagenous colitis    Diverticulosis    Enlarged prostate    followed by dr  gerold   Esophageal dysmotility    Esophageal dysmotility    Esophageal stenosis    Essential hypertension, benign 03/29/2014   External hemorrhoids    External hemorrhoids    Full dentures    GERD (gastroesophageal reflux disease)    Hemorrhoids    Hiatal hernia    IBS (irritable bowel syndrome)    Internal hemorrhoids    OSA on CPAP 10/17/2018   Osteoporosis    Postoperative anemia due to acute blood loss 03/29/2014   Schatzki's ring    Sleep apnea    CPAP Machine   Ulcer    Current Outpatient Medications  Medication Sig Dispense Refill   acetaminophen  (TYLENOL ) 500 MG tablet Take 1,000 mg by mouth in the morning and at bedtime.     albuterol  (PROVENTIL  HFA;VENTOLIN  HFA) 108 (90 Base) MCG/ACT inhaler Inhale 2 puffs into the lungs every 6 (six) hours as needed for wheezing or shortness of breath.     amLODipine  (NORVASC ) 5 MG tablet Take 10 mg by mouth daily.     apixaban  (ELIQUIS ) 2.5 MG TABS tablet Take 1 tablet (  2.5 mg total) by mouth 2 (two) times daily.     diclofenac Sodium (VOLTAREN) 1 % GEL Apply 2 g topically 4 (four) times daily as needed (pain.).     diphenoxylate -atropine  (LOMOTIL ) 2.5-0.025 MG tablet Take 1 tablet by mouth 4 (four) times daily as needed for diarrhea or loose stools. 30 tablet 1   doxazosin  (CARDURA ) 8 MG tablet Take 4 mg by mouth daily.     empagliflozin  (JARDIANCE ) 10 MG TABS tablet Take 1 tablet (10 mg total) by mouth daily before breakfast. 60 tablet 5   famotidine  (PEPCID ) 40 MG tablet TAKE 1 TABLET BY MOUTH 2 TIMES A DAY 180 tablet 0   flecainide  (TAMBOCOR ) 50 MG tablet Take 2 tablets (100 mg total) by mouth 2 (two) times daily. PLEASE SCHEDULE APPOINTMENT FOR MORE REFILLS 180 tablet 1   furosemide  (LASIX ) 20 MG tablet Take  1 tablet by mouth as needed or directed by the Heart Failure clinic 15 tablet 11   gabapentin  (NEURONTIN ) 600 MG tablet Take 600 mg by mouth at bedtime.     loratadine  (CLARITIN ) 10 MG tablet Take 10 mg by mouth in the morning.     montelukast  (SINGULAIR ) 10 MG tablet Take 10 mg by mouth in the morning.     potassium chloride  SA (KLOR-CON  M) 20 MEQ tablet TAKE 1 TABLET EVERY DAY WITH SUPPER 90 tablet 3   PRESCRIPTION MEDICATION CPAP- At bedtime     prochlorperazine  (COMPAZINE ) 5 MG tablet Take 1 tablet (5 mg total) by mouth every 6 (six) hours as needed for nausea or vomiting. 30 tablet 0   saccharomyces boulardii (FLORASTOR) 250 MG capsule Take 1 capsule (250 mg total) by mouth 2 (two) times daily. 180 capsule 0   SYSTANE HYDRATION PF 0.4-0.3 % SOLN Place 1 drop into both eyes 3 (three) times daily as needed (for dryness).     zolpidem  (AMBIEN ) 10 MG tablet Take 10 mg by mouth at bedtime.     No current facility-administered medications for this visit.   Allergies  Allergen Reactions   Gluten Meal Other (See Comments)    Celiac Disease   Other Other (See Comments)    NO SEEDED FOODS!!   Wheat Other (See Comments)    Celiac Disease   Social History   Socioeconomic History   Marital status: Married    Spouse name: Not on file   Number of children: 3   Years of education: Not on file   Highest education level: Not on file  Occupational History   Occupation: retired  Tobacco Use   Smoking status: Former    Current packs/day: 0.00    Types: Cigarettes    Quit date: 08/11/1981    Years since quitting: 43.2   Smokeless tobacco: Never  Vaping Use   Vaping status: Never Used  Substance and Sexual Activity   Alcohol  use: Yes    Comment: rarely   Drug use: No   Sexual activity: Not on file  Other Topics Concern   Not on file  Social History Narrative   Not on file   Social Drivers of Health   Tobacco Use: Medium Risk (09/16/2024)   Patient History    Smoking Tobacco Use:  Former    Smokeless Tobacco Use: Never    Passive Exposure: Not on [place] Strain: Not on file  Food Insecurity: No Food Insecurity (11/29/2023)   Hunger Vital Sign    Worried About Running Out of Food in the Last Year:  Never true    Ran Out of Food in the Last Year: Never true  Transportation Needs: No Transportation Needs (11/29/2023)   PRAPARE - Administrator, Civil Service (Medical): No    Lack of Transportation (Non-Medical): No  Physical Activity: Not on file  Stress: Not on file  Social Connections: Socially Integrated (11/28/2023)   Social Connection and Isolation Panel    Frequency of Communication with Friends and Family: Three times a week    Frequency of Social Gatherings with Friends and Family: Twice a week    Attends Religious Services: 1 to 4 times per year    Active Member of [employer] or Organizations: Yes    Attends Banker Meetings: 1 to 4 times per year    Marital Status: Married  Catering Manager Violence: Not At Risk (11/29/2023)   Humiliation, Afraid, Rape, and Kick questionnaire    Fear of Current or Ex-Partner: No    Emotionally Abused: No    Physically Abused: No    Sexually Abused: No  Depression (PHQ2-9): Not on file  Alcohol  Screen: Not on file  Housing: Low Risk (11/29/2023)   Housing Stability Vital Sign    Unable to Pay for Housing in the Last Year: No    Number of Times Moved in the Last Year: 0    Homeless in the Last Year: No  Utilities: Not At Risk (11/29/2023)   [employer] Utilities    Threatened with loss of utilities: No  Recent Concern: Utilities - At Risk (11/27/2023)   [employer] Utilities    Threatened with loss of utilities: Yes  Health Literacy: Not on file   Family History  Problem Relation Age of Onset   Cervical cancer Mother    Colon cancer Mother    Stroke Father    Heart attack Father    Breast cancer Sister    Throat cancer Brother    Liver cancer Brother    Brain cancer Brother    Esophageal  cancer Neg Hx    Rectal cancer Neg Hx    Stomach cancer Neg Hx    Wt Readings from Last 3 Encounters:  09/16/24 68.3 kg (150 lb 9.6 oz)  01/28/24 61.5 kg (135 lb 9.6 oz)  01/21/24 63 kg (139 lb)   There were no vitals taken for this visit.  PHYSICAL EXAM: General:  NAD. No resp difficulty, walked into clinic, elderly HEENT: Normal Neck: Supple. JVP 10 Cor: Regular rate & rhythm. No rubs, gallops or murmurs. Lungs: Clear Abdomen: + distended, + umbilical hernia, nontender   Extremities: No cyanosis, clubbing, rash, trace ankle edema Neuro: Alert & oriented x 3, moves all 4 extremities w/o difficulty. Affect pleasant.  ECG (personally reviewed): NSR 1AVB 64 bpm   ReDs reading: 32%, normal  ASSESSMENT & PLAN:  1. Chronic diastolic HF in setting of AF - TEE 3/24 EF 55-60% mod-sev MR - Improved with return of NSR  - NYHA IIb today, volume improved but still up a bit, REDs 32%, R>L HF - had brisk diuresis with low dose lasix  so will start low-dose Jardiance  5 mg daily, want to avoid volume depletion. Discussed rationale and potential SE profile.  - Change Lasix  20 mg back to PRN, take 20 KCL when taking Lasix  - Update echo to ensure EF and MR stable - Labs today  2. PAF - new-onset 2/24 - Zio patch 9/24 no recurrence - NSR on ECG today - Continue flecainide  100 mg bid.  - Continue  Eliquis  2.5 mg bid. (With age > 80, Scr 1.44 and weight 61kg) - CBC today  3. OSA - Continue CPAP - No change  4. CKD 3b - baseline Scr 1.5-1.6 - Recent Scr 1.44 (01/22/24) - Recovered from CDiff, will start Jardiance  as above - Labs today.   5. HTN - BP well controlled - Avoid hypotension - Goal SBP 120-140 - Continue amlodipine  + dozazosin  6. Mitral regurgitation - Dr. Cherrie reviewed TEE. Seems more moderate range - Echo 5/24 EF 55-60% mild to mod MR (mostly mild)  - No murmur on exam, repeat echo as above to ensure MR stable.  Update echo. Follow up in 6 months with Dr.  Bensimhon.  Harlene CHRISTELLA Gainer, FNP  11:37 AM "

## 2024-11-09 ENCOUNTER — Ambulatory Visit (HOSPITAL_COMMUNITY)

## 2024-11-10 ENCOUNTER — Encounter (HOSPITAL_COMMUNITY): Payer: Self-pay | Admitting: Emergency Medicine

## 2024-11-10 ENCOUNTER — Emergency Department (HOSPITAL_COMMUNITY)

## 2024-11-10 ENCOUNTER — Other Ambulatory Visit: Payer: Self-pay

## 2024-11-10 ENCOUNTER — Emergency Department (HOSPITAL_COMMUNITY): Admission: EM | Admit: 2024-11-10 | Discharge: 2024-11-10 | Discharge status: Against Medical Advice

## 2024-11-10 DIAGNOSIS — R42 Dizziness and giddiness: Secondary | ICD-10-CM | POA: Insufficient documentation

## 2024-11-10 DIAGNOSIS — Z5321 Procedure and treatment not carried out due to patient leaving prior to being seen by health care provider: Secondary | ICD-10-CM | POA: Diagnosis not present

## 2024-11-10 DIAGNOSIS — M549 Dorsalgia, unspecified: Secondary | ICD-10-CM | POA: Insufficient documentation

## 2024-11-10 LAB — CBC
HCT: 34.8 % — ABNORMAL LOW (ref 39.0–52.0)
Hemoglobin: 11.3 g/dL — ABNORMAL LOW (ref 13.0–17.0)
MCH: 31 pg (ref 26.0–34.0)
MCHC: 32.5 g/dL (ref 30.0–36.0)
MCV: 95.3 fL (ref 80.0–100.0)
Platelets: 175 10*3/uL (ref 150–400)
RBC: 3.65 MIL/uL — ABNORMAL LOW (ref 4.22–5.81)
RDW: 14.3 % (ref 11.5–15.5)
WBC: 3.7 10*3/uL — ABNORMAL LOW (ref 4.0–10.5)
nRBC: 0 % (ref 0.0–0.2)

## 2024-11-10 LAB — COMPREHENSIVE METABOLIC PANEL WITH GFR
ALT: 11 U/L (ref 0–44)
AST: 23 U/L (ref 15–41)
Albumin: 4.4 g/dL (ref 3.5–5.0)
Alkaline Phosphatase: 53 U/L (ref 38–126)
Anion gap: 9 (ref 5–15)
BUN: 18 mg/dL (ref 8–23)
CO2: 26 mmol/L (ref 22–32)
Calcium: 9.3 mg/dL (ref 8.9–10.3)
Chloride: 105 mmol/L (ref 98–111)
Creatinine, Ser: 1.52 mg/dL — ABNORMAL HIGH (ref 0.61–1.24)
GFR, Estimated: 44 mL/min — ABNORMAL LOW
Glucose, Bld: 94 mg/dL (ref 70–99)
Potassium: 4.3 mmol/L (ref 3.5–5.1)
Sodium: 140 mmol/L (ref 135–145)
Total Bilirubin: 0.3 mg/dL (ref 0.0–1.2)
Total Protein: 7.3 g/dL (ref 6.5–8.1)

## 2024-11-10 NOTE — ED Notes (Signed)
 Pt's son has decided to take pt home, and told registration he is present in waiting room with pt, awaiting transport home at this time.

## 2024-11-10 NOTE — ED Triage Notes (Signed)
 Pt to ER via EMS form doctor's office.  Pt took a 4mg  diazepam today around 3pm for his back pain. This is a previously prescribed pain medication.  PCP was concerned because pt was not suppose to take this medication anymore.  Pt voices no c/o states very mild dizziness.  VSS.

## 2024-11-10 NOTE — ED Provider Triage Note (Signed)
 Emergency Medicine Provider Triage Evaluation Note  [name] , a 89 y.o. male  was evaluated in triage. Patient states that today he was seen at his primary care's office and had an episode of dizziness.  Patient endorses that he took a diazepam around 3 PM for back pain and believes this may have caused the episode of dizziness.  Patient denies near-syncope/syncope.  At this time, patient states that he feels weak.  He denies any shortness of breath or chest pain.  He is currently dizzy.  Patient is in no acute distress.  Review of Systems  Positive: weakness Negative: SOB, CP  Physical Exam  BP (!) 128/56   Pulse (!) 50   Temp 97.6 F (36.4 C) (Oral)   Resp 18   Ht 5' 7 (1.702 m)   Wt 68 kg   SpO2 99%   BMI 23.48 kg/m  Gen:   Awake, no distress   Resp:  Normal effort  MSK:   Moves extremities without difficulty  Other:    Medical Decision Making  Medically screening exam initiated at 5:01 PM.  Appropriate orders placed.  [name] was informed that the remainder of the evaluation will be completed by another provider, this initial triage assessment does not replace that evaluation, and the importance of remaining in the ED until their evaluation is complete.    Willma Duwaine CR[place], [place] 11/10/24 1708

## 2024-11-15 ENCOUNTER — Ambulatory Visit: Admitting: Cardiology

## 2024-11-16 NOTE — Progress Notes (Incomplete)
 "  ADVANCED HF CLINIC NOTE  Primary Care: Janey Santos, MD Primary Cardiologist: Peter Jordan, MD HF Cardiologist: Dr. Cherrie  HPI: [name] is an 89 y.o. male who is seen for follow up. Dr. Cherrie cares for his wife Apolinar, and he is Dr Jeralyn father in law. He has a history of HTN and OSA on CPAP. Prior Myoview  in 2010 was normal. He had a cardiac cath in 2018 showing normal coronaries and normal right heart hemodynamics. Echo in 2018 showed mild MR-otherwise normal. He is followed by Dr Shlomo for OSA. Has restless leg syndrome. Prior arterial dopplers normal.    Developed new onset AF in 2/24. Had DC-CV 12/16/22 lasted only a few days. Loaded Flecainide  and converted. Had recurrent AF on Flecainide  50 bid. Now maintaining NSR on 100 bid   In 8.24 had syncope. Brain MRI ok. We placed Zio  1. Sinus rhythm - min HR of 50 bpm, max HR of 126 bpm, and avg HR of 68 bpm.  2. First Degree AV Block was present.  3. Three runs of Supraventricular Tachycardia occurred, the run with the fastest interval lasting 4 beats with a max rate of 126 bpm, the longest lasting 7 beats with an avg rate of 98 bpm.  4. Rare PACs and PVCs 5. AF was not detected.  6. 13 patient-triggered events associated with sinus rhythm  TEE 3/24 EF 55-60% mod-sev MR   Echo 02/17/23 EF 55-60% mild to moderate MR   Hospitalized in 2/25 for C.diff with pseudomembranous colitis (was previously teated for PNA).   Today he returns for an acute visit with complaints of worsening LEE & SOB, with his daughter. Had gained about 10 lbs over last several weeks, started Lasix  20 for past 2 days and lost 9 lbs. He has SOB walking on flat ground. Abdomen is full. Occasional positional dizziness, no falls or syncope. Symptoms are improved but not totally gone. Denies palpitations, abnormal bleeding, CP, dizziness, or PND/Orthopnea. Appetite ok. Weight at home 150>>142 lbs. Taking all medications. No further GI illness or Crohns  flares.   Past Medical History:  Diagnosis Date   Anemia    Arthritis    osteoarthritis  back   Celiac disease    Chronic kidney disease    Collagenous colitis    Diverticulosis    Enlarged prostate    followed by dr  gerold   Esophageal dysmotility    Esophageal dysmotility    Esophageal stenosis    Essential hypertension, benign 03/29/2014   External hemorrhoids    External hemorrhoids    Full dentures    GERD (gastroesophageal reflux disease)    Hemorrhoids    Hiatal hernia    IBS (irritable bowel syndrome)    Internal hemorrhoids    OSA on CPAP 10/17/2018   Osteoporosis    Postoperative anemia due to acute blood loss 03/29/2014   Schatzki's ring    Sleep apnea    CPAP Machine   Ulcer    Current Outpatient Medications  Medication Sig Dispense Refill   acetaminophen  (TYLENOL ) 500 MG tablet Take 1,000 mg by mouth in the morning and at bedtime.     albuterol  (PROVENTIL  HFA;VENTOLIN  HFA) 108 (90 Base) MCG/ACT inhaler Inhale 2 puffs into the lungs every 6 (six) hours as needed for wheezing or shortness of breath.     amLODipine  (NORVASC ) 5 MG tablet Take 10 mg by mouth daily.     apixaban  (ELIQUIS ) 2.5 MG TABS tablet Take 1 tablet (  2.5 mg total) by mouth 2 (two) times daily.     diclofenac Sodium (VOLTAREN) 1 % GEL Apply 2 g topically 4 (four) times daily as needed (pain.).     diphenoxylate -atropine  (LOMOTIL ) 2.5-0.025 MG tablet Take 1 tablet by mouth 4 (four) times daily as needed for diarrhea or loose stools. 30 tablet 1   doxazosin  (CARDURA ) 8 MG tablet Take 4 mg by mouth daily.     empagliflozin  (JARDIANCE ) 10 MG TABS tablet Take 1 tablet (10 mg total) by mouth daily before breakfast. 60 tablet 5   famotidine  (PEPCID ) 40 MG tablet TAKE 1 TABLET BY MOUTH 2 TIMES A DAY 180 tablet 0   flecainide  (TAMBOCOR ) 50 MG tablet Take 2 tablets (100 mg total) by mouth 2 (two) times daily. PLEASE SCHEDULE APPOINTMENT FOR MORE REFILLS 180 tablet 1   furosemide  (LASIX ) 20 MG tablet Take  1 tablet by mouth as needed or directed by the Heart Failure clinic 15 tablet 11   gabapentin  (NEURONTIN ) 600 MG tablet Take 600 mg by mouth at bedtime.     loratadine  (CLARITIN ) 10 MG tablet Take 10 mg by mouth in the morning.     montelukast  (SINGULAIR ) 10 MG tablet Take 10 mg by mouth in the morning.     potassium chloride  SA (KLOR-CON  M) 20 MEQ tablet TAKE 1 TABLET EVERY DAY WITH SUPPER 90 tablet 3   PRESCRIPTION MEDICATION CPAP- At bedtime     prochlorperazine  (COMPAZINE ) 5 MG tablet Take 1 tablet (5 mg total) by mouth every 6 (six) hours as needed for nausea or vomiting. 30 tablet 0   saccharomyces boulardii (FLORASTOR) 250 MG capsule Take 1 capsule (250 mg total) by mouth 2 (two) times daily. 180 capsule 0   SYSTANE HYDRATION PF 0.4-0.3 % SOLN Place 1 drop into both eyes 3 (three) times daily as needed (for dryness).     zolpidem  (AMBIEN ) 10 MG tablet Take 10 mg by mouth at bedtime.     No current facility-administered medications for this visit.   Allergies  Allergen Reactions   Gluten Meal Other (See Comments)    Celiac Disease   Other Other (See Comments)    NO SEEDED FOODS!!   Wheat Other (See Comments)    Celiac Disease   Social History   Socioeconomic History   Marital status: Married    Spouse name: Not on file   Number of children: 3   Years of education: Not on file   Highest education level: Not on file  Occupational History   Occupation: retired  Tobacco Use   Smoking status: Former    Current packs/day: 0.00    Types: Cigarettes    Quit date: 08/11/1981    Years since quitting: 43.2   Smokeless tobacco: Never  Vaping Use   Vaping status: Never Used  Substance and Sexual Activity   Alcohol  use: Yes    Comment: rarely   Drug use: No   Sexual activity: Not on file  Other Topics Concern   Not on file  Social History Narrative   Not on file   Social Drivers of Health   Tobacco Use: Medium Risk (11/10/2024)   Patient History    Smoking Tobacco Use:  Former    Smokeless Tobacco Use: Never    Passive Exposure: Not on Actuary Strain: Not on file  Food Insecurity: No Food Insecurity (11/29/2023)   Hunger Vital Sign    Worried About Running Out of Food in the Last Year:  Never true    Ran Out of Food in the Last Year: Never true  Transportation Needs: No Transportation Needs (11/29/2023)   PRAPARE - Administrator, Civil Service (Medical): No    Lack of Transportation (Non-Medical): No  Physical Activity: Not on file  Stress: Not on file  Social Connections: Socially Integrated (11/28/2023)   Social Connection and Isolation Panel    Frequency of Communication with Friends and Family: Three times a week    Frequency of Social Gatherings with Friends and Family: Twice a week    Attends Religious Services: 1 to 4 times per year    Active Member of [employer] or Organizations: Yes    Attends Banker Meetings: 1 to 4 times per year    Marital Status: Married  Catering Manager Violence: Not At Risk (11/29/2023)   Humiliation, Afraid, Rape, and Kick questionnaire    Fear of Current or Ex-Partner: No    Emotionally Abused: No    Physically Abused: No    Sexually Abused: No  Depression (PHQ2-9): Not on file  Alcohol  Screen: Not on file  Housing: Low Risk (11/29/2023)   Housing Stability Vital Sign    Unable to Pay for Housing in the Last Year: No    Number of Times Moved in the Last Year: 0    Homeless in the Last Year: No  Utilities: Not At Risk (11/29/2023)   [employer] Utilities    Threatened with loss of utilities: No  Recent Concern: Utilities - At Risk (11/27/2023)   [employer] Utilities    Threatened with loss of utilities: Yes  Health Literacy: Not on file   Family History  Problem Relation Age of Onset   Cervical cancer Mother    Colon cancer Mother    Stroke Father    Heart attack Father    Breast cancer Sister    Throat cancer Brother    Liver cancer Brother    Brain cancer Brother    Esophageal  cancer Neg Hx    Rectal cancer Neg Hx    Stomach cancer Neg Hx    Wt Readings from Last 3 Encounters:  11/10/24 68 kg (149 lb 14.6 oz)  09/16/24 68.3 kg (150 lb 9.6 oz)  01/28/24 61.5 kg (135 lb 9.6 oz)   There were no vitals taken for this visit.  PHYSICAL EXAM: General:  NAD. No resp difficulty, walked into clinic, elderly HEENT: Normal Neck: Supple. JVP 10 Cor: Regular rate & rhythm. No rubs, gallops or murmurs. Lungs: Clear Abdomen: + distended, + umbilical hernia, nontender   Extremities: No cyanosis, clubbing, rash, trace ankle edema Neuro: Alert & oriented x 3, moves all 4 extremities w/o difficulty. Affect pleasant.  ECG (personally reviewed): NSR 1AVB 64 bpm   ReDs reading: 32%, normal  ASSESSMENT & PLAN:  1. Chronic diastolic HF in setting of AF - TEE 3/24 EF 55-60% mod-sev MR - Improved with return of NSR  - NYHA IIb today, volume improved but still up a bit, REDs 32%, R>L HF - had brisk diuresis with low dose lasix  so will start low-dose Jardiance  5 mg daily, want to avoid volume depletion. Discussed rationale and potential SE profile.  - Change Lasix  20 mg back to PRN, take 20 KCL when taking Lasix  - Update echo to ensure EF and MR stable - Labs today  2. PAF - new-onset 2/24 - Zio patch 9/24 no recurrence - NSR on ECG today - Continue flecainide  100 mg bid.  -  Continue Eliquis  2.5 mg bid. (With age > 80, Scr 1.44 and weight 61kg) - CBC today  3. OSA - Continue CPAP - No change  4. CKD 3b - baseline Scr 1.5-1.6 - Recent Scr 1.44 (01/22/24) - Recovered from CDiff, will start Jardiance  as above - Labs today.   5. HTN - BP well controlled - Avoid hypotension - Goal SBP 120-140 - Continue amlodipine  + dozazosin  6. Mitral regurgitation - Dr. Cherrie reviewed TEE. Seems more moderate range - Echo 5/24 EF 55-60% mild to mod MR (mostly mild)  - No murmur on exam, repeat echo as above to ensure MR stable.  Update echo. Follow up in 6 months with  Dr. Bensimhon.  Harlene CHRISTELLA Gainer, FNP  10:01 AM "

## 2024-11-17 ENCOUNTER — Telehealth: Payer: Self-pay

## 2024-11-17 NOTE — Telephone Encounter (Signed)
 SABRA

## 2024-11-18 ENCOUNTER — Telehealth (HOSPITAL_COMMUNITY): Payer: Self-pay

## 2024-11-18 NOTE — Telephone Encounter (Signed)
 Called to confirm/remind patient of their appointment at [facility] on 11/21/24.   Appointment:   [] Confirmed  [x] Left mess   [] No answer/No voice mail  [] VM Full/unable to leave message  [] Phone not in service  And to bring in all medications and/or complete list.

## 2024-11-21 ENCOUNTER — Ambulatory Visit (HOSPITAL_COMMUNITY)

## 2024-11-23 ENCOUNTER — Ambulatory Visit: Admitting: Cardiology
